# Patient Record
Sex: Male | Born: 1990
Health system: Southern US, Community
[De-identification: ages and names within clinical notes are randomized; demographics above are authoritative.]

## PROBLEM LIST (undated history)

## (undated) DIAGNOSIS — G47419 Narcolepsy without cataplexy: Secondary | ICD-10-CM

## (undated) DIAGNOSIS — F988 Other specified behavioral and emotional disorders with onset usually occurring in childhood and adolescence: Secondary | ICD-10-CM

## (undated) DIAGNOSIS — F419 Anxiety disorder, unspecified: Secondary | ICD-10-CM

## (undated) HISTORY — DX: Other specified behavioral and emotional disorders with onset usually occurring in childhood and adolescence: F98.8

## (undated) HISTORY — DX: Anxiety disorder, unspecified: F41.9

## (undated) HISTORY — DX: Narcolepsy without cataplexy: G47.419

## (undated) HISTORY — PX: KNEE SURGERY: SHX244

---
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
## 2011-02-01 ENCOUNTER — Ambulatory Visit (INDEPENDENT_AMBULATORY_CARE_PROVIDER_SITE_OTHER): Payer: BC Managed Care – PPO | Admitting: Internal Medicine

## 2011-02-01 DIAGNOSIS — Z23 Encounter for immunization: Secondary | ICD-10-CM

## 2011-02-01 DIAGNOSIS — G4700 Insomnia, unspecified: Secondary | ICD-10-CM

## 2011-02-01 DIAGNOSIS — J019 Acute sinusitis, unspecified: Secondary | ICD-10-CM

## 2011-02-01 DIAGNOSIS — G47419 Narcolepsy without cataplexy: Secondary | ICD-10-CM

## 2011-02-14 HISTORY — PX: WISDOM TOOTH EXTRACTION: SHX21

## 2011-06-15 ENCOUNTER — Telehealth: Payer: Self-pay

## 2011-06-15 MED ORDER — ZOLPIDEM TARTRATE 10 MG PO TABS: 10.0000 mg | ORAL_TABLET | Freq: Every evening | ORAL | Status: DC | PRN

## 2011-06-15 MED ORDER — AMPHETAMINE-DEXTROAMPHET ER 30 MG PO CP24: ORAL_CAPSULE | ORAL | Status: DC

## 2011-06-15 MED ORDER — AMPHETAMINE-DEXTROAMPHETAMINE 30 MG PO TABS: ORAL_TABLET | ORAL | Status: DC

## 2011-06-15 NOTE — Telephone Encounter (Signed)
Pt requesting his adderoll refills on 30 mg extended release and 30 mg regular release be sent to [facility] pharmacy  Dr. Merla Riches any questions please call patient

## 2011-06-15 NOTE — Telephone Encounter (Signed)
Advised pt that rx's are ready.  Pt wanted it mailed to him.  He also need his sleeping med

## 2011-06-15 NOTE — Telephone Encounter (Signed)
rxs are ready to be picked up (or we can mail them but we need address)

## 2011-07-26 ENCOUNTER — Telehealth: Payer: Self-pay

## 2011-07-26 ENCOUNTER — Ambulatory Visit: Payer: BC Managed Care – PPO | Admitting: Internal Medicine

## 2011-07-26 DIAGNOSIS — G47419 Narcolepsy without cataplexy: Secondary | ICD-10-CM

## 2011-07-26 DIAGNOSIS — G4700 Insomnia, unspecified: Secondary | ICD-10-CM

## 2011-07-26 MED ORDER — AMPHETAMINE-DEXTROAMPHETAMINE 30 MG PO TABS: ORAL_TABLET | ORAL | Status: DC

## 2011-07-26 MED ORDER — ZOLPIDEM TARTRATE 10 MG PO TABS: 10.0000 mg | ORAL_TABLET | Freq: Every evening | ORAL | Status: DC | PRN

## 2011-07-26 MED ORDER — AMPHETAMINE-DEXTROAMPHET ER 30 MG PO CP24: ORAL_CAPSULE | ORAL | Status: DC

## 2011-07-26 NOTE — Telephone Encounter (Signed)
Okay to refill his meds while waiting for appointment followup in July

## 2011-07-26 NOTE — Telephone Encounter (Signed)
LMOM THAT RX IS READY FOR PICKUP 

## 2011-07-26 NOTE — Telephone Encounter (Signed)
Pt had to cancel his appt with Dr. Merla Riches today as he was called into work. He is rescheduled for 7/10. Can he get 1 months refill on adderall and sleep meds? Please call when/if ready and he will pickup rx's.

## 2011-08-23 ENCOUNTER — Ambulatory Visit (INDEPENDENT_AMBULATORY_CARE_PROVIDER_SITE_OTHER): Payer: BC Managed Care – PPO | Admitting: Internal Medicine

## 2011-08-23 VITALS — BP 128/86 | HR 69 | Temp 97.0°F | Resp 16 | Ht 72.0 in | Wt 281.0 lb

## 2011-08-23 DIAGNOSIS — H9209 Otalgia, unspecified ear: Secondary | ICD-10-CM

## 2011-08-23 DIAGNOSIS — F988 Other specified behavioral and emotional disorders with onset usually occurring in childhood and adolescence: Secondary | ICD-10-CM

## 2011-08-23 DIAGNOSIS — Z6838 Body mass index (BMI) 38.0-38.9, adult: Secondary | ICD-10-CM

## 2011-08-23 DIAGNOSIS — M533 Sacrococcygeal disorders, not elsewhere classified: Secondary | ICD-10-CM

## 2011-08-23 DIAGNOSIS — L709 Acne, unspecified: Secondary | ICD-10-CM

## 2011-08-23 DIAGNOSIS — Z8659 Personal history of other mental and behavioral disorders: Secondary | ICD-10-CM

## 2011-08-23 DIAGNOSIS — G4700 Insomnia, unspecified: Secondary | ICD-10-CM

## 2011-08-23 DIAGNOSIS — G47419 Narcolepsy without cataplexy: Secondary | ICD-10-CM

## 2011-08-23 MED ORDER — AMOXICILLIN 500 MG PO CAPS: 1000.0000 mg | ORAL_CAPSULE | Freq: Two times a day (BID) | ORAL | Status: AC

## 2011-08-23 MED ORDER — AMPHETAMINE-DEXTROAMPHETAMINE 30 MG PO TABS: 45.0000 mg | ORAL_TABLET | Freq: Every day | ORAL | Status: DC

## 2011-08-23 MED ORDER — AMPHETAMINE-DEXTROAMPHET ER 30 MG PO CP24: 60.0000 mg | ORAL_CAPSULE | ORAL | Status: DC

## 2011-08-23 MED ORDER — ZOLPIDEM TARTRATE 10 MG PO TABS: 10.0000 mg | ORAL_TABLET | Freq: Every evening | ORAL | Status: DC | PRN

## 2011-08-23 MED ORDER — AMPHETAMINE-DEXTROAMPHET ER 30 MG PO CP24: ORAL_CAPSULE | ORAL | Status: DC

## 2011-08-23 MED ORDER — MELOXICAM 15 MG PO TABS: 15.0000 mg | ORAL_TABLET | Freq: Every day | ORAL | Status: DC

## 2011-08-23 MED ORDER — FLUTICASONE PROPIONATE 50 MCG/ACT NA SUSP: 2.0000 | Freq: Every day | NASAL | Status: DC

## 2011-08-23 NOTE — Progress Notes (Signed)
Subjective:    Patient ID: Frank [name], male    DOB: 07-17-1990, 21 y.o.   MRN: [id number]  HPI Patient Active Problem List  Diagnosis  . Narcolepsy-His current regimen of Adderall is effective  . 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  . Acne-Doxy  . History of OCD (obsessive compulsive disorder)-No symptoms now  . BMI 38.0-38.9,adult  Also complaining of ear pain x3 weeks with muffled hearing/history of increased Spring allergies and summertime allergies despite use of OTC antihistamines He also has had significant back discomfort over the past 3 or 4 months/at first it was on and off but now is more persistent His pain is in the lumbosacral and sciatic area on one side and seems to occasionally radiate into the leg without weakness or numbness. He gets worse with certain sports like golf or basketball.     Review of Systems No fever chills or night sweats No chest pain or palpitations No headaches No anxiety or sessions/sleep is good As he continues to use Ambien on a when necessary basis    Objective:   Physical Exam Blood pressure 128/86/wt 280 pounds = HEENT-right ear has tympanic membrane with fluid behind it that is the consistency of pus/eardrum injected. Left ear is clear/nares are boggy without purulent discharge Throat clear/no nodes or thyromegaly Heart regular Lungs clear The back is slightly tender over the lumbar area in the midline and particularly tender in the left SI joint Straight leg raise is intact to 90 bilaterally/external hip rotation produces pain in the left sciatic area Deep tendon reflexes are intact Neuropsychiatric stable      Assessment & Plan:  Problem #1 narcolepsy Problem #2 ADD He'll continue to use Adderall for both of these conditions Followup at semester  break  Problem #3 sacroiliitis/lumbar pain He will continue exercises and use MOBIC 15 mg daily for one to 2 months/if his back pain persists he will need radiological evaluation to find a diagnosis/physical therapy is an option while he is at school/We'll recheck this in one to 4 months depending on his symptoms  Problem #4 otitis media on the right secondary to allergic rhinitis Add Flonase to treat with Amoxil   Problem #5 insomnia-continue when necessary Ambien Meds ordered this encounter  Medications                . amoxicillin (AMOXIL) 500 MG capsule    Sig: Take 2 capsules (1,000 mg total) by mouth 2 (two) times daily.    Dispense:  40 capsule    Refill:  0  . fluticasone (FLONASE) 50 MCG/ACT nasal spray    Sig: Place 2 sprays into the nose daily. At hs for next 2-3 months    Dispense:  16 g    Refill:  6  . meloxicam (MOBIC) 15 MG tablet    Sig: Take 1 tablet (15 mg total) by mouth daily.    Dispense:  30 tablet    Refill:  1  . zolpidem (AMBIEN) 10 MG tablet    Sig: Take 1 tablet (10 mg total) by mouth at bedtime as needed for sleep.    Dispense:  30 tablet    Refill:  0  . amphetamine-dextroamphetamine (ADDERALL XR) 30 MG 24 hr capsule    Sig: Take 2 capsules (60 mg total) by mouth  every morning.    Dispense:  60 capsule    Refill:  0  . amphetamine-dextroamphetamine (ADDERALL XR) 30 MG 24 hr capsule    Sig: Take 2 capsules (60 mg total) by mouth every morning.    Dispense:  60 capsule    Refill:  0  . amphetamine-dextroamphetamine (ADDERALL) 30 MG tablet    Sig: Take 1.5 tablets (45 mg total) by mouth daily.    Dispense:  45 tablet    Refill:  0  . amphetamine-dextroamphetamine (ADDERALL) 30 MG tablet    Sig: Take 1.5 tablets (45 mg total) by mouth daily.    Dispense:  45 tablet    Refill:  0  . amphetamine-dextroamphetamine (ADDERALL) 30 MG tablet    Sig: Take 1.5 tablets (45 mg total) by mouth daily.    Dispense:  45 tablet    Refill:  0  .  amphetamine-dextroamphetamine (ADDERALL XR) 30 MG 24 hr capsule    Sig: 2 in the morning    Dispense:  60 capsule    Refill:  0   They called for another 3 months supply adderall He takes 60 mg of XR in the morning and 30-45 mg of regular release at 4 PM

## 2011-08-25 ENCOUNTER — Encounter: Payer: Self-pay | Admitting: Internal Medicine

## 2011-09-27 ENCOUNTER — Other Ambulatory Visit: Payer: Self-pay | Admitting: Radiology

## 2011-09-27 DIAGNOSIS — G4700 Insomnia, unspecified: Secondary | ICD-10-CM

## 2011-09-27 MED ORDER — ZOLPIDEM TARTRATE 10 MG PO TABS: 10.0000 mg | ORAL_TABLET | Freq: Every evening | ORAL | Status: DC | PRN

## 2011-11-27 ENCOUNTER — Telehealth: Payer: Self-pay

## 2011-11-27 NOTE — Telephone Encounter (Signed)
His Adderall was filled for 6 mo supply in July, he should still have this. Unable to leave message, his voice mail is full. I need to know what meds he needs.

## 2011-11-27 NOTE — Telephone Encounter (Signed)
PATIENT HAS CALLED IN REGARDS TO GETTING A REFILL ON ALL HIS MEDICATION. HIS PCP IS DR. Merla Riches, PATIENT ASK IF ITS POSSIBLE TO PICK UP ON Thursday. PLEASE CALL BACK TO SEE IF THIS IS POSSIBLE. THANK YOU!!

## 2011-11-28 NOTE — Telephone Encounter (Signed)
Unable to leave message

## 2011-11-29 NOTE — Telephone Encounter (Signed)
Pt states he was given two months only. He states 2 XR in the am and 2 regular tabs in the evening. Please advise

## 2011-11-30 NOTE — Telephone Encounter (Signed)
[place] database indicates that he just filled his Adderall XR 30 mg on 11/15/11. Our records also indicate that he did receive 3 month supply of each his XR and the regular release. However the [facility] database does not yet show that he has filled the third prescription of the regular release Adderall. It indicates this was last filled on 10/23/11.

## 2011-12-01 ENCOUNTER — Telehealth: Payer: Self-pay

## 2011-12-01 ENCOUNTER — Other Ambulatory Visit: Payer: Self-pay | Admitting: Internal Medicine

## 2011-12-01 DIAGNOSIS — F988 Other specified behavioral and emotional disorders with onset usually occurring in childhood and adolescence: Secondary | ICD-10-CM

## 2011-12-01 DIAGNOSIS — M533 Sacrococcygeal disorders, not elsewhere classified: Secondary | ICD-10-CM

## 2011-12-01 DIAGNOSIS — G47419 Narcolepsy without cataplexy: Secondary | ICD-10-CM

## 2011-12-01 DIAGNOSIS — Z8659 Personal history of other mental and behavioral disorders: Secondary | ICD-10-CM

## 2011-12-01 DIAGNOSIS — G4700 Insomnia, unspecified: Secondary | ICD-10-CM

## 2011-12-01 MED ORDER — AMPHETAMINE-DEXTROAMPHET ER 30 MG PO CP24: 60.0000 mg | ORAL_CAPSULE | ORAL | Status: DC

## 2011-12-01 MED ORDER — AMPHETAMINE-DEXTROAMPHETAMINE 30 MG PO TABS: 45.0000 mg | ORAL_TABLET | Freq: Every day | ORAL | Status: DC

## 2011-12-01 MED ORDER — ZOLPIDEM TARTRATE 10 MG PO TABS: 10.0000 mg | ORAL_TABLET | Freq: Every evening | ORAL | Status: DC | PRN

## 2011-12-01 MED ORDER — AMPHETAMINE-DEXTROAMPHET ER 30 MG PO CP24: ORAL_CAPSULE | ORAL | Status: DC

## 2011-12-01 MED ORDER — MELOXICAM 15 MG PO TABS: 15.0000 mg | ORAL_TABLET | Freq: Every day | ORAL | Status: DC

## 2011-12-01 NOTE — Progress Notes (Signed)
Rxs printed and signed. Dr Merla Riches has notified mother that they are ready for p/up.

## 2011-12-01 NOTE — Telephone Encounter (Signed)
Rx are printed and signed. Dr Merla Riches has notified mother they are ready for p/up

## 2011-12-01 NOTE — Telephone Encounter (Signed)
Mom is calling and would like to discuss why her sons request for refills has not been acknowledged.   Please call BECKY at [phone]

## 2011-12-01 NOTE — Progress Notes (Signed)
Doing well with current regimen and just needs refills for another 3 months Meds ordered this encounter  Medications  . meloxicam (MOBIC) 15 MG tablet    Sig: Take 1 tablet (15 mg total) by mouth daily.    Dispense:  90 tablet    Refill:  2  . zolpidem (AMBIEN) 10 MG tablet    Sig: Take 1 tablet (10 mg total) by mouth at bedtime as needed for sleep.    Dispense:  30 tablet    Refill:  5  . amphetamine-dextroamphetamine (ADDERALL) 30 MG tablet    Sig: Take 1.5 tablets (45 mg total) by mouth daily.    Dispense:  45 tablet    Refill:  0  . amphetamine-dextroamphetamine (ADDERALL XR) 30 MG 24 hr capsule    Sig: Take 2 capsules (60 mg total) by mouth every morning.    Dispense:  60 capsule    Refill:  0  . amphetamine-dextroamphetamine (ADDERALL XR) 30 MG 24 hr capsule    Sig: Take 2 capsules (60 mg total) by mouth every morning.    Dispense:  60 capsule    Refill:  0  . amphetamine-dextroamphetamine (ADDERALL XR) 30 MG 24 hr capsule    Sig: 2 in the morning    Dispense:  60 capsule    Refill:  0  . amphetamine-dextroamphetamine (ADDERALL) 30 MG tablet    Sig: Take 1.5 tablets (45 mg total) by mouth daily.    Dispense:  45 tablet    Refill:  0  . amphetamine-dextroamphetamine (ADDERALL) 30 MG tablet    Sig: Take 1.5 tablets (45 mg total) by mouth daily.    Dispense:  45 tablet    Refill:  0

## 2011-12-01 NOTE — Telephone Encounter (Signed)
Dr Merla Riches has spoken w/mother, clarified, and written Rxs. He notified mother they are ready for p/up.

## 2012-01-30 ENCOUNTER — Ambulatory Visit (INDEPENDENT_AMBULATORY_CARE_PROVIDER_SITE_OTHER): Payer: BC Managed Care – PPO | Admitting: Physician Assistant

## 2012-01-30 VITALS — BP 131/92 | HR 72 | Temp 97.7°F | Resp 16 | Ht 72.0 in | Wt 283.0 lb

## 2012-01-30 DIAGNOSIS — H6980 Other specified disorders of Eustachian tube, unspecified ear: Secondary | ICD-10-CM

## 2012-01-30 DIAGNOSIS — G4700 Insomnia, unspecified: Secondary | ICD-10-CM

## 2012-01-30 DIAGNOSIS — R059 Cough, unspecified: Secondary | ICD-10-CM

## 2012-01-30 DIAGNOSIS — R05 Cough: Secondary | ICD-10-CM

## 2012-01-30 DIAGNOSIS — M533 Sacrococcygeal disorders, not elsewhere classified: Secondary | ICD-10-CM

## 2012-01-30 DIAGNOSIS — J069 Acute upper respiratory infection, unspecified: Secondary | ICD-10-CM

## 2012-01-30 MED ORDER — TRIAMCINOLONE ACETONIDE(NASAL) 55 MCG/ACT NA INHA: 2.0000 | Freq: Every day | NASAL | Status: DC

## 2012-01-30 MED ORDER — GUAIFENESIN ER 1200 MG PO TB12: 1.0000 | ORAL_TABLET | Freq: Two times a day (BID) | ORAL | Status: DC

## 2012-01-30 MED ORDER — ZOLPIDEM TARTRATE 10 MG PO TABS: 10.0000 mg | ORAL_TABLET | Freq: Every evening | ORAL | Status: DC | PRN

## 2012-01-30 MED ORDER — MELOXICAM 15 MG PO TABS: 15.0000 mg | ORAL_TABLET | Freq: Every day | ORAL | Status: DC

## 2012-01-30 MED ORDER — HYDROCOD POLST-CHLORPHEN POLST 10-8 MG/5ML PO LQCR: 5.0000 mL | Freq: Two times a day (BID) | ORAL | Status: DC

## 2012-01-30 NOTE — Patient Instructions (Addendum)
Sudafed for congestion if still congested prior to flight - use about 30-1h before take off -Afrin - use 30 min prior to departure and then again before decent into [place].  If ears having pain/popping getting to [place] use again before take off in [place].

## 2012-01-30 NOTE — Progress Notes (Signed)
   [address], [place] [address]   Phone [phone]  Subjective:    Patient ID: Frank [name], male    DOB: 1990/12/07, 21 y.o.   MRN: [id number]  HPI  Pt presents to clinic with cold symptoms for about 6 days with ear clogged, sinus congestion and cough.  He goes to school at [employer] and was having trouble with his ears before he left for school.  His R was worse and now that one has improved and now he is having trouble with his L ear.  No hearing changed.  Popping sensation. Also sinus pressure with some mild rhinorrhea, cough that is keeping him up at night.  He is also needing a refill on his Ambien and his Mobic.  He is going skiing this week at [facility].  He uses his Ambien 2-3 nights per week.    Review of Systems  Constitutional: Negative for fever and chills.  HENT: Positive for ear pain (currently L>R), congestion, rhinorrhea and postnasal drip. Negative for sore throat.   Respiratory: Positive for cough (increased at night).   Psychiatric/Behavioral: Positive for sleep disturbance.       Objective:   Physical Exam  Vitals reviewed. Constitutional: He is oriented to person, place, and time. He appears well-developed and well-nourished.  HENT:  Head: Normocephalic and atraumatic.  Right Ear: Hearing, external ear and ear canal normal. Tympanic membrane is erythematous and bulging (clear fluid with air fluid level).  Left Ear: Hearing, external ear and ear canal normal. Tympanic membrane is erythematous and bulging (clear fluid behind TM).  Nose: Mucosal edema (erythema) present.  Mouth/Throat: Uvula is midline, oropharynx is clear and moist and mucous membranes are normal.  Eyes: Conjunctivae normal are normal.  Neck: Normal range of motion. Neck supple.  Cardiovascular: Normal rate, regular rhythm and normal heart sounds.   Pulmonary/Chest: Effort normal and breath sounds normal.  Lymphadenopathy:    He has no cervical adenopathy.  Neurological: He is alert  and oriented to person, place, and time.  Skin: Skin is warm and dry.  Psychiatric: He has a normal mood and affect. His behavior is normal. Judgment and thought content normal.          Assessment & Plan:   1. URI (upper respiratory infection)  triamcinolone (NASACORT AQ) 55 MCG/ACT nasal inhaler, Guaifenesin (MUCINEX MAXIMUM STRENGTH) 1200 MG TB12  2. Insomnia  zolpidem (AMBIEN) 10 MG tablet  3. ETD (eustachian tube dysfunction)  triamcinolone (NASACORT AQ) 55 MCG/ACT nasal inhaler  4. Cough  chlorpheniramine-HYDROcodone (TUSSIONEX PENNKINETIC ER) 10-8 MG/5ML LQCR  5. Sacroiliac joint dysfunction of left side  meloxicam (MOBIC) 15 MG tablet

## 2012-02-01 ENCOUNTER — Telehealth: Payer: Self-pay

## 2012-02-01 MED ORDER — AMOXICILLIN 875 MG PO TABS: 875.0000 mg | ORAL_TABLET | Freq: Two times a day (BID) | ORAL | Status: DC

## 2012-02-01 NOTE — Telephone Encounter (Signed)
PT IS CALLING TO LET SARAH WEBER KNOW THAT HE IS STILL NOT FEELING BETTER AND HE STILL FLYING OUT TONIGHT, HIS EARS STILL HAVE NOT RELEASED PT STATED THAT SARAH TOLD HIM TO CALL BACK AND SPEAK DIRECTLY TO HER. I CALLED [facility] NO ANSWER PLEASE CONTACT PT TO ADVISE [phone]

## 2012-02-01 NOTE — Telephone Encounter (Signed)
Sent in antibiotic to pharmacy. Spoke with patient.

## 2012-04-12 ENCOUNTER — Other Ambulatory Visit: Payer: Self-pay | Admitting: Physician Assistant

## 2012-04-12 NOTE — Telephone Encounter (Signed)
I filled this for him once while he was here for a sick visit but Dr Merla Riches is regular provider.

## 2012-04-15 ENCOUNTER — Other Ambulatory Visit: Payer: Self-pay | Admitting: Radiology

## 2012-04-23 ENCOUNTER — Ambulatory Visit: Payer: BC Managed Care – PPO

## 2012-04-23 ENCOUNTER — Ambulatory Visit (INDEPENDENT_AMBULATORY_CARE_PROVIDER_SITE_OTHER): Payer: BC Managed Care – PPO | Admitting: Internal Medicine

## 2012-04-23 VITALS — BP 132/78 | HR 92 | Temp 98.0°F | Resp 18 | Ht 71.75 in | Wt 282.0 lb

## 2012-04-23 DIAGNOSIS — M533 Sacrococcygeal disorders, not elsewhere classified: Secondary | ICD-10-CM

## 2012-04-23 DIAGNOSIS — J309 Allergic rhinitis, unspecified: Secondary | ICD-10-CM

## 2012-04-23 DIAGNOSIS — G47419 Narcolepsy without cataplexy: Secondary | ICD-10-CM

## 2012-04-23 DIAGNOSIS — M545 Low back pain, unspecified: Secondary | ICD-10-CM

## 2012-04-23 DIAGNOSIS — J019 Acute sinusitis, unspecified: Secondary | ICD-10-CM

## 2012-04-23 DIAGNOSIS — F909 Attention-deficit hyperactivity disorder, unspecified type: Secondary | ICD-10-CM

## 2012-04-23 DIAGNOSIS — G4700 Insomnia, unspecified: Secondary | ICD-10-CM

## 2012-04-23 MED ORDER — ZOLPIDEM TARTRATE 10 MG PO TABS: ORAL_TABLET | ORAL | Status: DC

## 2012-04-23 MED ORDER — FLUTICASONE PROPIONATE 50 MCG/ACT NA SUSP: NASAL | Status: DC

## 2012-04-23 MED ORDER — PREDNISONE 20 MG PO TABS: ORAL_TABLET | ORAL | Status: DC

## 2012-04-23 MED ORDER — AMOXICILLIN 500 MG PO CAPS: 1000.0000 mg | ORAL_CAPSULE | Freq: Two times a day (BID) | ORAL | Status: AC

## 2012-04-23 MED ORDER — AMPHETAMINE-DEXTROAMPHET ER 30 MG PO CP24: 60.0000 mg | ORAL_CAPSULE | ORAL | Status: DC

## 2012-04-23 MED ORDER — AMPHETAMINE-DEXTROAMPHETAMINE 30 MG PO TABS: 45.0000 mg | ORAL_TABLET | Freq: Every day | ORAL | Status: DC

## 2012-04-23 NOTE — Progress Notes (Signed)
Subjective:    Patient ID: Frank [name], male    DOB: 07-24-1990, 22 y.o.   MRN: [id number]  HPI here for followup Patient Active Problem List  Diagnosis  . Narcolepsy  . ADD (attention deficit disorder)  . Acne  . History of OCD (obsessive compulsive disorder)  . BMI 38.0-38.9,adult  . Insomnia   Continues to have a few medical issues: ears popping frequently/treated for sinus infection here in December and partially improved but has relapsed with sinus congestion/early morning cough/purulent nasal discharge/popping of the ears with swallowing or coughing/no fever/sneezing often  Also continues to have Back/hip  Pain on the left as evaluated at last visit He has not responded to meloxicam and exercises Pain is now mainly left lumbosacral and worse with sitting, driving//occasionally worse with exercise but usually improves slightly and then returns later in the evening No weakness/no paresthesias/no known lumbosacral injury  Narcolepsy and attention deficit disorder continue to respond to Adderall without side effects Good academics this year/18 months ago at the completion of the semester 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  Currently working with [employer]  Ambien continues to be needed for sleep/no depression/no anxiety/no sessions at this point   Review of Systems No weight loss or weight gain No fatigue No night sweats No shortness of breath or cough Chest pain or palpitations No headache or visual changes No GI or GU symptoms    Objective:   Physical Exam BP 132/78  Pulse 92  Temp(Src) 98 F (36.7 C) (Oral)  Resp 18  Ht 5' 11.75" (1.822 m)  Wt 282 lb (127.914 kg)  BMI 38.53 kg/m2  SpO2 97% HEENT clear No thyromegaly or lymphadenopathy Lungs clear Heart regular without murmur Tender over the left lumbosacral area and left SI joint Pain in the left SI joint with straight leg raise past 75 Twisting flexion uncomfortable Deep tendon reflexes  intact/no motor or sensory losses      [facility] reading (PRIMARY) by  Dr. Merla Riches lumbar spine normal   Assessment & Plan:  Problem #1 narcolepsy-no change Problem #2 ADD-no change Problem #3 low back pain-muscular likely/with are without sacroiliitis-prednisone taper/referred for  physical therapy in [place]/call if needs  meloxicam Problem #4 recurrent sinusitis/? Allergic rhinitis from living conditions-Flonase twice a day/as antihistamines if not enough/treated with amoxicillin for 10 days/prednisone taper Problem #5 insomnia-continue Ambien  Meds ordered this encounter  Medications  . amoxicillin (AMOXIL) 500 MG capsule    Sig: Take 2 capsules (1,000 mg total) by mouth 2 (two) times daily.    Dispense:  40 capsule    Refill:  0  . fluticasone (FLONASE) 50 MCG/ACT nasal spray    Sig: 1 spray each nostril twice a day    Dispense:  16 g    Refill:  11  . : amphetamine-dextroamphetamine (ADDERALL) 30 MG tablet    Sig: Take 1.5 tablets (45 mg total) by mouth daily.    Dispense:  45 tablet    Refill:  0  .  amphetamine-dextroamphetamine (ADDERALL XR) 30 MG 24 hr capsule    Sig: Take 2 capsules (60 mg total) by mouth every morning.    Dispense:  60 capsule    Refill:  0  .  amphetamine-dextroamphetamine (ADDERALL XR) 30 MG 24 hr capsule    Sig: Take 2 capsules (60 mg total) by mouth every morning. 05/24/12    Dispense:  60 capsule    Refill:  0  .  amphetamine-dextroamphetamine (ADDERALL) 30 MG tablet  Sig: Take 1.5 tablets (45 mg total) by mouth daily. 05/24/12    Dispense:  45 tablet    Refill:  0  . amphetamine-dextroamphetamine (ADDERALL XR) 30 MG 24 hr capsule    Sig: Take 2 capsules (60 mg total) by mouth every morning. 06/23/12    Dispense:  60 capsule    Refill:  0  . amphetamine-dextroamphetamine (ADDERALL) 30 MG tablet    Sig: Take 1.5 tablets (45 mg total) by mouth daily. 06/23/12    Dispense:  45 tablet    Refill:  0  . zolpidem (AMBIEN) 10 MG tablet    Sig: TAKE  1 TABLET BY MOUTH AT BEDTIME AS NEEDED FOR SLEEP    Dispense:  30 tablet    Refill:  5  . predniSONE (DELTASONE) 20 MG tablet    Sig: 4/3/3/2/2/1/1 single daily dose 7 days////this was written to replace the prescription from earlier this afternoon for a three-day therapy    Dispense:  16 tablet    Refill:  0  Recheck 6 months

## 2012-04-25 ENCOUNTER — Telehealth: Payer: Self-pay

## 2012-04-25 NOTE — Telephone Encounter (Signed)
Pt went to pharmacy to pick up his amphetamine-dextroamphetamine (ADDERALL XR) 30 MG 24 hr capsule And was told his insurance company will no longer cover the medication because he is over 20 years of age.  [phone]

## 2012-04-26 NOTE — Telephone Encounter (Signed)
Do you have prior  auth for this?

## 2012-04-29 NOTE — Telephone Encounter (Signed)
I have not received a request from pharmacy. Called pharmacy and requested that they send PA request w/ID and phone info needed to complete PA.

## 2012-05-08 NOTE — Telephone Encounter (Signed)
Called pharm back to f/up. Pharm reported that pt p/up both Rxs and paid OOP for them ($150 for the XR) and that is why they never sent the PA request. Got them to fax PA req and sent completed form to [facility] for review.

## 2012-05-10 NOTE — Telephone Encounter (Signed)
Called to check status of PA for both Adderall Rxs. Was advised that neither med is covered at all for pt's over 21, even w/prior [place]. Pt will need to pay OOP.  LMOM for pt to CB to inform. Does he want to try more freq dosing of immediate release to keep cost down?

## 2012-05-13 ENCOUNTER — Encounter: Payer: Self-pay | Admitting: Internal Medicine

## 2012-05-21 ENCOUNTER — Telehealth: Payer: Self-pay

## 2012-05-21 NOTE — Telephone Encounter (Signed)
Mailed letter to him, since he has not called

## 2012-05-21 NOTE — Telephone Encounter (Signed)
Called pharm and then pt's mother to get the correct phone number for ins. Called [employer] to find out how to do an appeal for coverage of pt's Adderall Rxs for narcolepsy. Faxed in appeal letter written by Dr Merla Riches to # [phone] w/confirmation.

## 2012-06-25 NOTE — Telephone Encounter (Signed)
LMOM for pt that Adderall XR has been approved through the appeal from 06/19/12 - 06/19/13. The letter did not specify that regular version is also approved. Asked pt to CB if the regular version does not go through when filled.

## 2012-08-02 ENCOUNTER — Telehealth: Payer: Self-pay | Admitting: Radiology

## 2012-08-02 NOTE — Telephone Encounter (Signed)
Prior auth form rc'd for the Adderall Frank [name] is already working on this form in her box.

## 2012-08-05 NOTE — Telephone Encounter (Signed)
Called pharmacy to make sure this Rx went through and it did. It was approved through appeal, see prev phone mes.

## 2012-08-28 ENCOUNTER — Telehealth: Payer: Self-pay

## 2012-08-28 DIAGNOSIS — G47419 Narcolepsy without cataplexy: Secondary | ICD-10-CM

## 2012-08-28 MED ORDER — AMPHETAMINE-DEXTROAMPHETAMINE 30 MG PO TABS: 45.0000 mg | ORAL_TABLET | Freq: Every day | ORAL | Status: DC

## 2012-08-28 MED ORDER — AMPHETAMINE-DEXTROAMPHET ER 30 MG PO CP24: 60.0000 mg | ORAL_CAPSULE | ORAL | Status: DC

## 2012-08-28 NOTE — Telephone Encounter (Signed)
Called patient to let him know his Adderall rx's were at 102 check-in ready for pick up.

## 2012-08-28 NOTE — Telephone Encounter (Signed)
Meds ordered this encounter  Medications  . amphetamine-dextroamphetamine (ADDERALL) 30 MG tablet    Sig: Take 1.5 tablets (45 mg total) by mouth daily.    Dispense:  45 tablet    Refill:  0  . amphetamine-dextroamphetamine (ADDERALL XR) 30 MG 24 hr capsule    Sig: Take 2 capsules (60 mg total) by mouth every morning.    Dispense:  60 capsule    Refill:  0  . amphetamine-dextroamphetamine (ADDERALL) 30 MG tablet    Sig: Take 1.5 tablets (45 mg total) by mouth daily.    Dispense:  45 tablet    Refill:  0    Fill on or after 09/28/12  . amphetamine-dextroamphetamine (ADDERALL XR) 30 MG 24 hr capsule    Sig: Take 2 capsules (60 mg total) by mouth every morning.    Dispense:  60 capsule    Refill:  0    Fill on or after 09/28/12  . amphetamine-dextroamphetamine (ADDERALL XR) 30 MG 24 hr capsule    Sig: Take 2 capsules (60 mg total) by mouth every morning.    Dispense:  60 capsule    Refill:  0    For 10/27/12 needs follow up before this runs out.  Marland Kitchen amphetamine-dextroamphetamine (ADDERALL) 30 MG tablet    Sig: Take 1.5 tablets (45 mg total) by mouth daily.    Dispense:  45 tablet    Refill:  0    For 10/28/12 needs follow up before this runs out

## 2012-08-28 NOTE — Telephone Encounter (Signed)
Pt is in town for the day and pt's mother is calling to check and see if rx for adderall can be picked up today. Best#  [phone] or Ramin Zoll (mother) at [phone]

## 2012-09-11 ENCOUNTER — Ambulatory Visit: Payer: Self-pay | Admitting: Internal Medicine

## 2012-11-28 ENCOUNTER — Telehealth: Payer: Self-pay

## 2012-11-28 ENCOUNTER — Other Ambulatory Visit: Payer: Self-pay | Admitting: Sports Medicine

## 2012-11-28 ENCOUNTER — Other Ambulatory Visit: Payer: Self-pay | Admitting: Internal Medicine

## 2012-11-28 DIAGNOSIS — M25562 Pain in left knee: Secondary | ICD-10-CM

## 2012-11-28 DIAGNOSIS — G47419 Narcolepsy without cataplexy: Secondary | ICD-10-CM

## 2012-11-28 NOTE — Telephone Encounter (Signed)
Refill request amphetamine-dextroamphetamine (ADDERALL XR) 30 MG 24 hr capsule amphetamine-dextroamphetamine (ADDERALL) 30 MG tablet   [phone]

## 2012-11-28 NOTE — Telephone Encounter (Signed)
amphetamine-dextroamphetamine (ADDERALL XR) 30 MG 24 hr capsule refill   [phone]

## 2012-11-28 NOTE — Telephone Encounter (Signed)
He is due for follow up, what is the plan? He states he can come back in the next 2-3 weeks, he indicates he is having knee trouble with his knee, and has MRI scheduled for this, and is needing treatment related to his knee. Please advise. Pended.

## 2012-11-29 ENCOUNTER — Ambulatory Visit
Admission: RE | Admit: 2012-11-29 | Discharge: 2012-11-29 | Disposition: A | Discharge status: Home / Self Care | Payer: BC Managed Care – PPO | Source: Ambulatory Visit | Attending: Sports Medicine | Admitting: Sports Medicine

## 2012-11-29 DIAGNOSIS — M25562 Pain in left knee: Secondary | ICD-10-CM

## 2012-11-29 MED ORDER — AMPHETAMINE-DEXTROAMPHETAMINE 30 MG PO TABS: 45.0000 mg | ORAL_TABLET | Freq: Every day | ORAL | Status: DC

## 2012-11-29 MED ORDER — AMPHETAMINE-DEXTROAMPHET ER 30 MG PO CP24: 60.0000 mg | ORAL_CAPSULE | ORAL | Status: DC

## 2012-11-29 NOTE — Telephone Encounter (Signed)
Rx printed

## 2012-11-29 NOTE — Telephone Encounter (Signed)
Patient advised.Rx ready, he is due for follow up.

## 2012-11-29 NOTE — Telephone Encounter (Signed)
[name] is on meds for narcolepsy and refills are certainly ok i cant sign til mon pm so have heather if he needs it before then

## 2012-12-03 ENCOUNTER — Telehealth: Payer: Self-pay | Admitting: Radiology

## 2012-12-03 ENCOUNTER — Telehealth: Payer: Self-pay

## 2012-12-03 NOTE — Telephone Encounter (Signed)
[facility] not get the [facility] for Ambien, have called in to [facility].

## 2012-12-03 NOTE — Telephone Encounter (Signed)
Patient should have this transferred, [facility] called pharmacy to advise

## 2012-12-03 NOTE — Telephone Encounter (Signed)
[facility] in [place] is calling for a refill request on Ambien.   [phone]

## 2012-12-17 ENCOUNTER — Other Ambulatory Visit: Payer: Self-pay | Admitting: Internal Medicine

## 2012-12-17 DIAGNOSIS — G47419 Narcolepsy without cataplexy: Secondary | ICD-10-CM

## 2012-12-17 DIAGNOSIS — M533 Sacrococcygeal disorders, not elsewhere classified: Secondary | ICD-10-CM

## 2012-12-17 MED ORDER — AMPHETAMINE-DEXTROAMPHET ER 30 MG PO CP24: 60.0000 mg | ORAL_CAPSULE | ORAL | Status: DC

## 2012-12-17 MED ORDER — ZOLPIDEM TARTRATE 10 MG PO TABS: ORAL_TABLET | ORAL | Status: DC

## 2012-12-17 MED ORDER — AMPHETAMINE-DEXTROAMPHETAMINE 30 MG PO TABS: 45.0000 mg | ORAL_TABLET | Freq: Every day | ORAL | Status: DC

## 2012-12-17 MED ORDER — MELOXICAM 15 MG PO TABS: 15.0000 mg | ORAL_TABLET | Freq: Every day | ORAL | Status: DC

## 2012-12-17 MED ORDER — ALPRAZOLAM 0.5 MG PO TABS: 0.5000 mg | ORAL_TABLET | Freq: Two times a day (BID) | ORAL | Status: DC | PRN

## 2012-12-17 MED ORDER — FLUOXETINE HCL 20 MG PO TABS: 20.0000 mg | ORAL_TABLET | Freq: Every day | ORAL | Status: DC

## 2012-12-17 NOTE — Progress Notes (Signed)
Patient Active Problem List   Diagnosis Date Noted  . Narcolepsy 07/26/2011    Priority: High  . Insomnia 01/30/2012  . ADD (attention deficit disorder) 08/23/2011  . Acne 08/23/2011  . History of OCD (obsessive compulsive disorder) 08/23/2011  . 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

## 2013-01-29 ENCOUNTER — Ambulatory Visit: Payer: Self-pay | Admitting: Internal Medicine

## 2013-04-21 ENCOUNTER — Telehealth: Payer: Self-pay

## 2013-04-21 NOTE — Telephone Encounter (Signed)
Pt would like a refill on the following: Adderall xr Adderall  Zolpidem fluticasone  Pt states that he has exams coming up and will be leaving town soon. Best#  [phone]

## 2013-04-22 MED ORDER — AMPHETAMINE-DEXTROAMPHET ER 30 MG PO CP24: 60.0000 mg | ORAL_CAPSULE | ORAL | Status: DC

## 2013-04-22 MED ORDER — ZOLPIDEM TARTRATE 10 MG PO TABS: ORAL_TABLET | ORAL | Status: DC

## 2013-04-22 MED ORDER — AMPHETAMINE-DEXTROAMPHETAMINE 30 MG PO TABS: 45.0000 mg | ORAL_TABLET | Freq: Every day | ORAL | Status: DC

## 2013-04-22 MED ORDER — FLUTICASONE PROPIONATE 50 MCG/ACT NA SUSP: NASAL | Status: DC

## 2013-04-22 NOTE — Telephone Encounter (Signed)
Pt states he was here in the office and had an appt with you "about 3-4 months ago" I do not see any visit in Epic. Advised pt that we needed him to RTC for these refills. Last OV is 04/2012.  Pt asked to have me verify with Dr. Merla Richesoolittle- adamant he was here. Please advise refill request.

## 2013-04-22 NOTE — Progress Notes (Signed)
   Subjective:    Patient ID: Frank [name], male    DOB: 10/19/1990, 23 y.o.   MRN: [id number]  HPI    Review of Systems     Objective:   Physical Exam        Assessment & Plan:   Meds ordered this encounter  Medications  . amphetamine-dextroamphetamine (ADDERALL XR) 30 MG 24 hr capsule    Sig: Take 2 capsules (60 mg total) by mouth every morning.    Dispense:  60 capsule    Refill:  0  . amphetamine-dextroamphetamine (ADDERALL XR) 30 MG 24 hr capsule    Sig: Take 2 capsules (60 mg total) by mouth every morning. For 30 days after dates signed    Dispense:  60 capsule    Refill:  0  . amphetamine-dextroamphetamine (ADDERALL XR) 30 MG 24 hr capsule    Sig: Take 2 capsules (60 mg total) by mouth every morning. For 60 days after dates signed    Dispense:  60 capsule    Refill:  0  . amphetamine-dextroamphetamine (ADDERALL) 30 MG tablet    Sig: Take 1.5 tablets (45 mg total) by mouth daily.    Dispense:  45 tablet    Refill:  0  . amphetamine-dextroamphetamine (ADDERALL) 30 MG tablet    Sig: Take 1.5 tablets (45 mg total) by mouth daily. For 30 days after dates signed    Dispense:  45 tablet    Refill:  0  . amphetamine-dextroamphetamine (ADDERALL) 30 MG tablet    Sig: Take 1.5 tablets (45 mg total) by mouth daily. For 60 days after day signed    Dispense:  45 tablet    Refill:  0  . zolpidem (AMBIEN) 10 MG tablet    Sig: TAKE 1 TABLET BY MOUTH AT BEDTIME AS NEEDED    Dispense:  30 tablet    Refill:  5  . fluticasone (FLONASE) 50 MCG/ACT nasal spray    Sig: 1 spray each nostril twice a day    Dispense:  16 g    Refill:  11   Narcolepsy

## 2013-04-25 ENCOUNTER — Other Ambulatory Visit: Payer: Self-pay | Admitting: Internal Medicine

## 2013-04-28 NOTE — Telephone Encounter (Signed)
See also RF req in phone message. Pt WAS seen by you in 01/2013 for narcolepsy.

## 2013-04-30 NOTE — Telephone Encounter (Signed)
Called in.

## 2013-04-30 NOTE — Telephone Encounter (Signed)
This was done and pt has p/up.

## 2013-07-31 ENCOUNTER — Telehealth: Payer: Self-pay

## 2013-07-31 DIAGNOSIS — G47419 Narcolepsy without cataplexy: Secondary | ICD-10-CM

## 2013-07-31 NOTE — Telephone Encounter (Signed)
Refill on amphetamine-dextroamphetamine (ADDERALL XR) 30 MG 24 hr capsule; says he has an appt. On July 1st, but he is already out of his prescription

## 2013-08-01 MED ORDER — AMPHETAMINE-DEXTROAMPHET ER 30 MG PO CP24: 60.0000 mg | ORAL_CAPSULE | ORAL | Status: DC

## 2013-08-01 MED ORDER — AMPHETAMINE-DEXTROAMPHETAMINE 30 MG PO TABS: 45.0000 mg | ORAL_TABLET | Freq: Every day | ORAL | Status: DC

## 2013-08-01 NOTE — Telephone Encounter (Signed)
Pt notified that it is up front for p/u 

## 2013-08-01 NOTE — Telephone Encounter (Signed)
No orders of the defined types were placed in this encounter.    Meds ordered this encounter  Medications  . amphetamine-dextroamphetamine (ADDERALL XR) 30 MG 24 hr capsule    Sig: Take 2 capsules (60 mg total) by mouth every morning.    Dispense:  60 capsule    Refill:  0  . amphetamine-dextroamphetamine (ADDERALL) 30 MG tablet    Sig: Take 1.5 tablets (45 mg total) by mouth daily.    Dispense:  45 tablet    Refill:  0

## 2013-08-13 ENCOUNTER — Encounter: Payer: Self-pay | Admitting: Internal Medicine

## 2013-08-13 ENCOUNTER — Ambulatory Visit (INDEPENDENT_AMBULATORY_CARE_PROVIDER_SITE_OTHER): Payer: BC Managed Care – PPO | Admitting: Internal Medicine

## 2013-08-13 VITALS — BP 138/82 | HR 92 | Temp 97.7°F | Resp 18 | Ht 73.5 in | Wt 285.0 lb

## 2013-08-13 DIAGNOSIS — Z8659 Personal history of other mental and behavioral disorders: Secondary | ICD-10-CM

## 2013-08-13 DIAGNOSIS — I781 Nevus, non-neoplastic: Secondary | ICD-10-CM

## 2013-08-13 DIAGNOSIS — M533 Sacrococcygeal disorders, not elsewhere classified: Secondary | ICD-10-CM

## 2013-08-13 DIAGNOSIS — S63659A Sprain of metacarpophalangeal joint of unspecified finger, initial encounter: Secondary | ICD-10-CM

## 2013-08-13 DIAGNOSIS — G47419 Narcolepsy without cataplexy: Secondary | ICD-10-CM

## 2013-08-13 DIAGNOSIS — G4700 Insomnia, unspecified: Secondary | ICD-10-CM

## 2013-08-13 DIAGNOSIS — S63641A Sprain of metacarpophalangeal joint of right thumb, initial encounter: Secondary | ICD-10-CM

## 2013-08-13 DIAGNOSIS — S5331XA Traumatic rupture of right ulnar collateral ligament, initial encounter: Secondary | ICD-10-CM

## 2013-08-13 MED ORDER — ALPRAZOLAM 0.5 MG PO TABS: ORAL_TABLET | ORAL | Status: DC

## 2013-08-13 MED ORDER — ZOLPIDEM TARTRATE 10 MG PO TABS: ORAL_TABLET | ORAL | Status: DC

## 2013-08-13 MED ORDER — AMPHETAMINE-DEXTROAMPHET ER 30 MG PO CP24: 60.0000 mg | ORAL_CAPSULE | ORAL | Status: DC

## 2013-08-13 MED ORDER — MELOXICAM 15 MG PO TABS: 15.0000 mg | ORAL_TABLET | Freq: Every day | ORAL | Status: DC

## 2013-08-13 MED ORDER — AMPHETAMINE-DEXTROAMPHETAMINE 30 MG PO TABS: 45.0000 mg | ORAL_TABLET | Freq: Every day | ORAL | Status: DC

## 2013-08-13 NOTE — Progress Notes (Signed)
   Subjective:    Patient ID: Frank [name], male    DOB: 08/08/1990, 23 y.o.   MRN: [id number]  HPI  Chief Complaint  Patient presents with  . Medication Refill    [facility], all other meds go to his local pharmacy  . thumb pain    right thumb  . skin tag    Neck x 2    -  Narcolepsy  All A's this semster!!!!settled conflict w/ stud affairs  Thumb 35mo pain after fall skiing with thumb bent back  2 growths R neck very annoying--easily traumatized  To Grad in DEC  Review of Systems Complains of a long history of recurrent lumbar pain with radiation to the left leg/occasionally interferes with activity/no sensory or motor changes     Objective:   Physical Exam BP 138/82  Pulse 92  Temp(Src) 97.7 F (36.5 C)  Resp 18  Ht 6' 1.5" (1.867 m)  Wt 285 lb (129.275 kg)  BMI 37.09 kg/m2  SpO2 97% HEENT clear Heart regular without murmur No thyromegaly Chest clear No peripheral edema The right thumb is not swollen but is tender around the MCP. Extensor tendon is nontender.Marland Kitchen. His range of motion shows no tenderness but he has laxity of the ulnar collateral ligament at the MCP joint #1 Neuro intact  Skin with 2 small flesh colored  growths along the right collar line  Mood good and affect appropriate Thought content normal/judgment sound     procedure= The 2 nevi along the right collar line are anesthetized with 2% lidocaine with epinephrine///both are removed scan With scissors and the bases are cauterized with silver nitrite No complications  Assessment & Plan:  Narcolepsy - Plan: amphetamine-dextroamphetamine (ADDERALL XR) 30 MG 24 hr capsule, amphetamine-dextroamphetamine (ADDERALL) 30 MG tablet, zolpidem (AMBIEN) 10 MG tablet  Sacroiliac joint dysfunction of left side - Plan: meloxicam (MOBIC) 15 MG tablet  Gamekeeper's thumb of right hand, initial encounter - Plan: Ambulatory referral to Orthopedic Surgery  History of OCD (obsessive compulsive  disorder)---with anxiety  Insomnia--secondary  Nevus, non-neoplastic--x2 removed  Meds ordered this encounter  Medications  . amphetamine-dextroamphetamine (ADDERALL XR) 30 MG 24 hr capsule    Sig: Take 2 capsules (60 mg total) by mouth every morning. For 30 days after dates signed    Dispense:  180 capsule    Refill:  0    For narcolepsy  . amphetamine-dextroamphetamine (ADDERALL) 30 MG tablet    Sig: Take 1.5 tablets (45 mg total) by mouth daily. For narcolepsy    Dispense:  135 tablet    Refill:  0  . zolpidem (AMBIEN) 10 MG tablet    Sig: TAKE 1 TABLET BY MOUTH AT BEDTIME AS NEEDED    Dispense:  90 tablet    Refill:  1  . meloxicam (MOBIC) 15 MG tablet    Sig: Take 1 tablet (15 mg total) by mouth daily.    Dispense:  90 tablet    Refill:  3    Order Specific Question:  Supervising Provider    Answer:  Nico Rogness P [[id number]]  . ALPRAZolam (XANAX) 0.5 MG tablet    Sig: TAKE 1 TABLET BY MOUTH TWICE DAILY AS NEEDED FOR ANXIETY    Dispense:  180 tablet    Refill:  1   call3 f-u 6 or mychart

## 2013-09-18 ENCOUNTER — Telehealth: Payer: Self-pay

## 2013-09-18 DIAGNOSIS — G47419 Narcolepsy without cataplexy: Secondary | ICD-10-CM

## 2013-09-18 NOTE — Telephone Encounter (Signed)
Pt called in because his insurance company will not cover the Adderall prescriptions written for more than one month at a time. He needs both the Rx and Tablets to be written for 1 month. He would like to know if this could be [facility] to [facility] on [address] [place] because he is away at school, if not he said that his parent could pick it up for him.

## 2013-09-19 NOTE — Telephone Encounter (Signed)
Please advise 

## 2013-09-22 MED ORDER — AMPHETAMINE-DEXTROAMPHETAMINE 30 MG PO TABS: 45.0000 mg | ORAL_TABLET | Freq: Every day | ORAL | Status: DC

## 2013-09-22 NOTE — Telephone Encounter (Signed)
Meds ordered this encounter  Medications  . amphetamine-dextroamphetamine (ADDERALL) 30 MG tablet    Sig: Take 1.5 tablets (45 mg total) by mouth daily. For narcolepsy    Dispense:  135 tablet    Refill:  0

## 2013-09-23 NOTE — Telephone Encounter (Signed)
Pt notified and rx mailed to pt.  [id number] [place], [place] [id number]

## 2013-09-25 NOTE — Telephone Encounter (Signed)
PA was also needed for Adderall 30 mg , 1.5 QD because quantity limit of #30 in 30 days. I did get approval for #45 in 30 days through 09/25/14, case # [id number].  Dr Merla Richesoolittle, in researching for PA it appears that pt needed a 30 day supply of both meds sent in bc ins will not pay for 90 day supplies. The 90 day you just wrote for pt has already been mailed. Do you want to write for 30 days and mail them to pt? I will have to call him and ask him to destroy or mail back the incorrect script for 90 days when he receives it. I have pended 1 mos of each Rx, but don't know if you want to send any add'l months or not?

## 2013-09-25 NOTE — Addendum Note (Signed)
Addended by: Sheppard PlumberBRIGGS, Riverlyn Kizziah A on: 09/25/2013 04:12 PM   Modules accepted: Orders

## 2013-09-30 ENCOUNTER — Telehealth: Payer: Self-pay

## 2013-09-30 DIAGNOSIS — G47419 Narcolepsy without cataplexy: Secondary | ICD-10-CM

## 2013-09-30 MED ORDER — AMPHETAMINE-DEXTROAMPHETAMINE 30 MG PO TABS: 45.0000 mg | ORAL_TABLET | Freq: Every day | ORAL | Status: DC

## 2013-09-30 MED ORDER — AMPHETAMINE-DEXTROAMPHET ER 30 MG PO CP24: 60.0000 mg | ORAL_CAPSULE | ORAL | Status: DC

## 2013-09-30 NOTE — Telephone Encounter (Signed)
  Meds ordered this encounter  Medications  . amphetamine-dextroamphetamine (ADDERALL XR) 30 MG 24 hr capsule    Sig: Take 2 capsules (60 mg total) by mouth every morning. For 30 days after dates signed    Dispense:  60 capsule    Refill:  0    For narcolepsy  . amphetamine-dextroamphetamine (ADDERALL) 30 MG tablet    Sig: Take 1.5 tablets (45 mg total) by mouth daily. For narcolepsy    Dispense:  45 tablet    Refill:  0  . amphetamine-dextroamphetamine (ADDERALL) 30 MG tablet    Sig: Take 1.5 tablets (45 mg total) by mouth daily. For narcolepsy. For 30 days after date signed    Dispense:  45 tablet    Refill:  0  . amphetamine-dextroamphetamine (ADDERALL XR) 30 MG 24 hr capsule    Sig: Take 2 capsules (60 mg total) by mouth every morning.    Dispense:  60 capsule    Refill:  0    For narcolepsy   Mail [address] [place] [id number].

## 2013-09-30 NOTE — Telephone Encounter (Signed)
Patient states he only received one of his "Adderall" medications in the mail. Per patient he still needs "Adderall 30mg  XR 24 caps". Patient states he has been without this medication for 2 weeks now. Patient requesting it to be mailed to him at [facility] [place] [phone]. Patients call back number is [phone]

## 2013-10-01 NOTE — Telephone Encounter (Signed)
Called pt to notify  Rxs are ready to mail and verified his address. Put in box in MR to mail.

## 2014-02-25 ENCOUNTER — Encounter: Payer: Self-pay | Admitting: Internal Medicine

## 2014-02-25 ENCOUNTER — Ambulatory Visit (INDEPENDENT_AMBULATORY_CARE_PROVIDER_SITE_OTHER): Payer: BLUE CROSS/BLUE SHIELD | Admitting: Internal Medicine

## 2014-02-25 VITALS — BP 130/74 | HR 77 | Temp 98.0°F | Resp 16 | Ht 71.5 in | Wt 259.0 lb

## 2014-02-25 DIAGNOSIS — G47419 Narcolepsy without cataplexy: Secondary | ICD-10-CM

## 2014-02-25 DIAGNOSIS — M545 Low back pain, unspecified: Secondary | ICD-10-CM

## 2014-02-25 DIAGNOSIS — Z8659 Personal history of other mental and behavioral disorders: Secondary | ICD-10-CM

## 2014-02-25 DIAGNOSIS — M5489 Other dorsalgia: Secondary | ICD-10-CM

## 2014-02-25 DIAGNOSIS — F909 Attention-deficit hyperactivity disorder, unspecified type: Secondary | ICD-10-CM

## 2014-02-25 DIAGNOSIS — F988 Other specified behavioral and emotional disorders with onset usually occurring in childhood and adolescence: Secondary | ICD-10-CM

## 2014-02-25 DIAGNOSIS — M7582 Other shoulder lesions, left shoulder: Secondary | ICD-10-CM

## 2014-02-25 MED ORDER — AMPHETAMINE-DEXTROAMPHETAMINE 30 MG PO TABS: 45.0000 mg | ORAL_TABLET | Freq: Every day | ORAL | Status: DC

## 2014-02-25 MED ORDER — AMPHETAMINE-DEXTROAMPHET ER 30 MG PO CP24
60.0000 mg | ORAL_CAPSULE | ORAL | Status: DC
Start: 2014-02-25 — End: 2014-03-04

## 2014-02-25 MED ORDER — ZOLPIDEM TARTRATE 10 MG PO TABS: ORAL_TABLET | ORAL | Status: DC

## 2014-02-25 MED ORDER — ALPRAZOLAM 0.5 MG PO TABS: ORAL_TABLET | ORAL | Status: DC

## 2014-02-25 MED ORDER — AMPHETAMINE-DEXTROAMPHET ER 30 MG PO CP24: 60.0000 mg | ORAL_CAPSULE | ORAL | Status: DC

## 2014-02-25 MED ORDER — MELOXICAM 15 MG PO TABS: 15.0000 mg | ORAL_TABLET | Freq: Every day | ORAL | Status: DC

## 2014-02-25 NOTE — Progress Notes (Signed)
Subjective:    Patient ID: Frank [name], male    DOB: 11/14/1990, 24 y.o.   MRN: [id number]  HPIf/u Patient Active Problem List   Diagnosis Date Noted  . Narcolepsy 07/26/2011    Priority: High  . Insomnia 01/30/2012  . ADD (attention deficit disorder) 08/23/2011  . Acne 08/23/2011  . History of OCD (obsessive compulsive disorder) 08/23/2011  . 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 When he sits down to watch a movie he may fall asleep. His sleep is nonrestorative. He has hypersomnolence throughout the day. He is very worried about this next level of work and whether his medicines need to be changed. While off of Adderall he has no need of Ambien to fall asleep. He has been on stimulants for 10 years. No diagnosis of sleep disorder after sleep study with Dr.Dohmeir///he now has additional history of severe snoring   mile a day at least//incr exercise since grad   Review of Systems L shoulder pops w/ rom and popping hurts x 3 mos//nki No HA No palp or CP No doe No fatigue    Also complaining of decreased hearing and tinnitus in the left ear for 10 days since returning from [place] Has noticed some nasal congestion//no dizziness  Objective:   Physical Exam BP 130/74 mmHg  Pulse 77  Temp(Src) 98 F (36.7 C) (Oral)  Resp 16  Ht 5' 11.5" (1.816 m)  Wt 259 lb (117.482 kg)  BMI 35.62 kg/m2  SpO2 97% TMs clear Nares boggy turbinates/throat clear/no nodes  thyromegaly//large neck with shallow hypopharynx Heart regular Lungs clear Cranial nerves II through XII intact Remainder of neurological intact Left shoulder with full range of motion but with popping throughout the upper levels/popping produced discomfort posteriorly Tender to palpation over the posterior shoulder just above the scapula/no swelling or redness Adduction and abduction against resistance not painful Mood good, affect appropriate, judgment sound, thought content normal     audiogram reveals normal hearing Assessment & Plan:  Shoulder L pain-secondary to rotator cuff tendinitis-range of motion exercises/meloxicam Tinnitus L--secondary to congestion-Sudafed for 10 days Sleep disorder/narcolep/snoring/attention deficit disorder  Reevaluation of ADD with Dr. Derrell LollingIngram  [facility] including multiple sleep latency testing  If this cannot be accomplished prior to his training in [place] on February 2 and we will restart his  medications they got him through college and arrange evaluation once he returns [place]  I left a message with the sleep study center at [facility] to call me back, cell phone Back pain  He is addressing this with chiropractic help at this point  If needed: Meds ordered this encounter  Medications  . meloxicam (MOBIC) 15 MG tablet    Sig: Take 1 tablet (15 mg total) by mouth daily.    Dispense:  30 tablet    Refill:  0  . ALPRAZolam (XANAX) 0.5 MG tablet    Sig: TAKE 1 TABLET BY MOUTH TWICE DAILY  AS NEEDED FOR ANXIETY    Dispense:  180 tablet    Refill:  1  . amphetamine-dextroamphetamine (ADDERALL XR) 30 MG 24 hr capsule    Sig: Take 2 capsules (60 mg total) by mouth every morning. For 30 days after dates signed    Dispense:  60 capsule    Refill:  0    For narcolepsy  . amphetamine-dextroamphetamine (ADDERALL XR) 30 MG 24 hr capsule    Sig: Take 2 capsules (60 mg total) by mouth  every morning.    Dispense:  60 capsule    Refill:  0    For narcolepsy  . amphetamine-dextroamphetamine (ADDERALL) 30 MG tablet    Sig: Take 1.5 tablets by mouth daily. For narcolepsy    Dispense:  45 tablet    Refill:  0  . amphetamine-dextroamphetamine (ADDERALL) 30 MG tablet    Sig: Take 1.5 tablets by mouth daily. For narcolepsy. For 30 days after date signed    Dispense:  45 tablet    Refill:  0  . zolpidem (AMBIEN) 10 MG tablet    Sig: TAKE 1 TABLET BY MOUTH AT BEDTIME AS NEEDED    Dispense:  90 tablet    Refill:  1   follow-up 1/27

## 2014-03-02 ENCOUNTER — Telehealth: Payer: Self-pay | Admitting: Internal Medicine

## 2014-03-02 DIAGNOSIS — F988 Other specified behavioral and emotional disorders with onset usually occurring in childhood and adolescence: Secondary | ICD-10-CM

## 2014-03-02 DIAGNOSIS — G479 Sleep disorder, unspecified: Secondary | ICD-10-CM

## 2014-03-02 DIAGNOSIS — R419 Unspecified symptoms and signs involving cognitive functions and awareness: Secondary | ICD-10-CM

## 2014-03-02 DIAGNOSIS — G47419 Narcolepsy without cataplexy: Secondary | ICD-10-CM

## 2014-03-02 DIAGNOSIS — G4700 Insomnia, unspecified: Secondary | ICD-10-CM

## 2014-03-02 NOTE — Telephone Encounter (Signed)
[facility]--[name] has appt for sleep study and MSLT on 26th!!! Fax ref to [phone]

## 2014-03-04 ENCOUNTER — Encounter: Payer: Self-pay | Admitting: Neurology

## 2014-03-04 ENCOUNTER — Ambulatory Visit (INDEPENDENT_AMBULATORY_CARE_PROVIDER_SITE_OTHER): Payer: BLUE CROSS/BLUE SHIELD | Admitting: Neurology

## 2014-03-04 VITALS — BP 115/67 | HR 80 | Resp 14 | Ht 72.0 in | Wt 266.5 lb

## 2014-03-04 DIAGNOSIS — E663 Overweight: Secondary | ICD-10-CM | POA: Insufficient documentation

## 2014-03-04 DIAGNOSIS — G4719 Other hypersomnia: Secondary | ICD-10-CM | POA: Insufficient documentation

## 2014-03-04 DIAGNOSIS — R0683 Snoring: Secondary | ICD-10-CM | POA: Insufficient documentation

## 2014-03-04 NOTE — Patient Instructions (Signed)
Narcolepsy Narcolepsy is a nervous system disorder that causes daytime sleepiness and sudden bouts of irresistible sleep during the day (sleep attacks). Many people with narcolepsy live with extreme daytime sleepiness for many years before being diagnosed and treated. Narcolepsy is a lifelong (chronic) disorder.  You normally go through cycles when you sleep. When your sleep becomes deeper, you have less body movement, and you start dreaming. This type of deep sleep should happen after about 90 minutes of lighter sleep. Deep sleep is called rapid eye movement (REM) sleep. When you have narcolepsy, REM is not well regulated. It can happen as soon as you fall asleep, and components of REM sleep can occur during the day when you are awake. Uncontrolled REM sleep causes symptoms of narcolepsy. CAUSES Narcolepsy may be caused by an abnormality with a chemical messenger (neurotransmitter) in your brain that controls your sleep and wake cycles. Most people with narcolepsy have low levels of the neurotransmitter hypocretin. Hypocretin is important for controlling wakefulness.  A hypocretin imbalance may be caused by abnormal genes that are passed down through families. It may also develop if the body's defense system (immune system) mistakenly attacks the brain cells that produce hypocretin (autoimmune disease). RISK FACTORS You may be at higher risk for narcolepsy if you have a family history of the disease. Other risk factors that may contribute include:  Injuries, tumors, or infections in the areas of the brain that control sleep.  Exposure to toxins.  Stress.  Hormones produced during puberty and menopause.  Poor sleep habits. SIGNS AND SYMPTOMS  Symptoms of narcolepsy can start at any age but usually begin when people are between the ages of 7 and 25 years. There are four major symptoms. Not everyone with narcolepsy will have all four.   Excessive daytime sleepiness. This is the most common symptom  and is usually the first symptom you will notice.  You may feel as if you are in a mental fog.  Daytime sleepiness may severely affect your performance at work or school.  You may fall asleep during a conversation or while eating dinner.  Sudden loss of muscle tone (cataplexy). You do not lose consciousness, but you may suddenly lose muscle control. When this occurs, your speech may become slurred, or your knees may buckle. This symptom is usually triggered by surprise, anger, or laughter.  Sleep paralysis. You may lose the ability to speak or move just as you start to fall asleep or wake up. You will be aware of the paralysis. It usually lasts for just a few seconds or minutes.  Vivid hallucinations. These may occur with sleep paralysis. The hallucinations are like having bizarre or frightening dreams while you are still awake. Other symptoms may include:   Trouble staying asleep at night (insomnia).  Restless sleep.  Feeling a strong urge to get up at night to smoke or eat. DIAGNOSIS  Your health care provider can diagnose narcolepsy based on your symptoms and the results of two diagnostic tests. You may also be asked to keep a sleep diary for several weeks. You may need the tests if you have had daytime sleepiness for at least 3 months. The two tests are:   A polysomnogram to find out how well your REM sleep is regulated at night. This test is an overnight sleep study. It measures your heart rate, breathing, movement, and brain waves.  A multiple sleep latency test (MSLT) to find out how well your REM sleep is regulated during the day. This   is a daytime sleep study. You may need to take several naps during the day. This test also measures your heart rate, breathing, movement, and brain waves. TREATMENT  There is no cure for narcolepsy, but treatment can be very effective in helping manage the condition. Treatment may include:  Lifestyle and sleeping strategies to help cope with the  condition.  Medicines. These may include:  Stimulant medicines to fight daytime sleepiness.  Antidepressant medicines to treat cataplexy.  Sodium oxybate. This is a strong sedative that you take at night. It can help daytime sleepiness and cataplexy. HOME CARE INSTRUCTIONS  Take all medicines as directed by your health care provider.  Follow these sleep practices:  Try to get about 8 hours of sleep every night.  Go to sleep and get up close to the same time every day.  Keep your bedroom dark, quiet, and comfortable.  Schedule short naps for when you feel sleepiest during the day. Tell your employer or teachers that you have narcolepsy. You may be able to adjust your schedule to include time for naps.  Try to get at least 20 minutes of exercise every day. This will help you sleep better at night and reduce daytime sleepiness.  Do not drink alcohol or caffeinated beverages within 4-5 hours of bedtime.  Do not eat a heavy meal before bedtime. Eat at about the same times every day.  Do not smoke.  Do not drive if you are sleepy or have untreated narcolepsy.  Do not swim or go out on the water without a life jacket. SEEK MEDICAL CARE IF: Your medicines are not controlling your narcolepsy symptoms. SEEK IMMEDIATE MEDICAL CARE IF:  You hurt yourself during a sleep attack or an attack of cataplexy.  You have chest pain or trouble breathing. Document Released: 01/20/2002 Document Revised: 06/16/2013 Document Reviewed: 01/22/2013 ExitCare Patient Information 2015 [employer]. This information is not intended to replace advice given to you by your health care provider. Make sure you discuss any questions you have with your health care provider.  

## 2014-03-04 NOTE — Progress Notes (Signed)
Provider:  Larey Seat, M D  Referring Provider: Leandrew Koyanagi, MD Primary Care Physician:  Leandrew Koyanagi, MD  Chief Complaint  Patient presents with  . NP sleep consult  Laney Pastor)    Rm 10, alone    HPI:  Frank [name] is a 24 y.o. male seen here as a referral  from Dr. Laney Pastor for a sleep consultation,  Mr. Dipaola had been seen in our sleep clinic approximately for 5 years ago and was evaluated for hypersomnia by a sleep study diagnosed with a minor case of apnea. He reports today that he is still very sleepy and ready to take a nap whenever not stimulated or physically inactive.  On 09-20-09 the patient was referred by Dr. Tami Lin with an Epworth sleepiness score of 11 points a BMI of 34. The Becks depression inventory was also endorsed at 15 points which was elevated for a young man of 81. Patient had a AHI of 4.6 nearly all of some REM related. There was also a strong positional component all apneas to place and supine sleep. The patient then followed with an MS LT the following day the mean sleep latency was 1.0 minutes he slept in 5 out of 5 naps but he did not reach any REM sleep. This was deemed to reflect extreme hypersomnia and possible narcolepsy as he has been treated with Adderall.  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 This is an ideal time to make sure that he is prepared for a valid MS LT test by weaning him off Adderall for at least 7 days. There is a high likelihood of rebound fatigue but not necessarily hypersomnia. He does not dream frequently and certainly not vivid. The patient stopped the Adderall already he reported 10 days ago, noted memory and focus f difficulties.  ADHD /ADD/. He can immediately start to have PSG and MSLT on 03-10-14 and 03-01-14.  If we end up with a similarly " twisted" result, I will order the HLA test.   He endorsed the Epworth sleepiness score at 8 points which  is not that high and the fatigue severity scale at 32 points which is elevated but not very high.   His sleep habits are as follows He usually goes to bed between 11:30 to midnight. On average she may take 10 minutes to fall asleep and it isolated nights it may take him much longer. Usually he sleeps through the night into the morning hours when he wakes up however he has no trouble to reinitiate sleep. He rises in the morning at 7:30, he has to rely on an alarm or several of these. During his school years his mother had to wake him up multiple times before he would finally rise this has essentially not changed. He estimates however that he gets about 7 hours of sleep nocturnally. He reports that he could sleep much more than that and that he often has an inadvertent unplanned nap. A nap the last about 15-40 minutes and is usually perceived as very refreshing-  the shorter the  nap the more refreshing the results He uses a fit bit and has been finding recordings of up to 18 periods of higher muscle activity and motion during the night. hus bed looks "as if struck by a hurricane".   He has gained weight since last encounter in 2011.   Review of Systems: no dream intrusion, no sleep paralysis,  had cataplectic attacks when angry.  Sleep talker, micro sleeper. Out of a complete 14 system review, the patient complains of only the following symptoms, and all other reviewed systems are negative, sleepiness, loss of motivation, highly fatigued, weight gain, irritable .   History   Social History  . Marital Status: Single    Spouse Name: N/A    Number of Children: N/A  . Years of Education: N/A   Occupational History  . Not on file.   Social History Main Topics  . Smoking status: Never Smoker   . Smokeless tobacco: Not on file  . Alcohol Use: 4.2 oz/week    7 Not specified per week  . Drug Use: No  . Sexual Activity: Yes   Other Topics Concern  . Not on file   Social History Narrative    Caffeine 1-2 cups a week    Family History  Problem Relation Age of Onset  . Stroke Maternal Grandmother   . Cancer Paternal Grandmother   . Hypertension Paternal Grandmother   . Diabetes Paternal Grandfather     No past medical history on file.  Past Surgical History  Procedure Laterality Date  . Wisdom tooth extraction  2013  . Knee surgery      Current Outpatient Prescriptions  Medication Sig Dispense Refill  . ALPRAZolam (XANAX) 0.5 MG tablet TAKE 1 TABLET BY MOUTH TWICE DAILY AS NEEDED FOR ANXIETY 180 tablet 1  . amphetamine-dextroamphetamine (ADDERALL XR) 30 MG 24 hr capsule Take 2 capsules (60 mg total) by mouth every morning. For 30 days after dates signed 60 capsule 0  . amphetamine-dextroamphetamine (ADDERALL XR) 30 MG 24 hr capsule Take 2 capsules (60 mg total) by mouth every morning. 60 capsule 0  . amphetamine-dextroamphetamine (ADDERALL) 30 MG tablet Take 1.5 tablets by mouth daily. For narcolepsy 45 tablet 0  . amphetamine-dextroamphetamine (ADDERALL) 30 MG tablet Take 1.5 tablets by mouth daily. For narcolepsy. For 30 days after date signed 45 tablet 0  . meloxicam (MOBIC) 15 MG tablet Take 1 tablet (15 mg total) by mouth daily. 30 tablet 0  . zolpidem (AMBIEN) 10 MG tablet TAKE 1 TABLET BY MOUTH AT BEDTIME AS NEEDED 90 tablet 1   No current facility-administered medications for this visit.    Allergies as of 03/04/2014  . (No Known Allergies)    Vitals: BP 115/67 mmHg  Pulse 80  Resp 14  Ht 6' (1.829 m)  Wt 266 lb 8 oz (120.884 kg)  BMI 36.14 kg/m2 Last Weight:  Wt Readings from Last 1 Encounters:  03/04/14 266 lb 8 oz (120.884 kg)   Last Height:   Ht Readings from Last 1 Encounters:  03/04/14 6' (1.829 m)    Physical exam:  General: The patient is awake, alert and appears not in acute distress. The patient is well groomed. Head: Normocephalic, atraumatic. Neck is supple. Mallampati 3 , neck circumference: 17.5 inches.  No nasal congestion and  no  TMJ, has on occasion Bruxism. Cardiovascular:  Regular rate and rhythm, without  murmurs or carotid bruit, and without distended neck veins. Respiratory: Lungs are clear to auscultation. Skin:  Without evidence of edema, or rash Trunk: BMI is  elevated and patient has normal posture.  Neurologic exam : The patient is awake and alert, oriented to place and time.  Memory subjective described as intact.  There is a normal attention span & concentration ability. Speech is fluent without  dysarthria, dysphonia or aphasia.  Mood and affect are appropriate.  Cranial nerves: Pupils are equal and briskly reactive to light. Funduscopic exam without evidence of pallor or edema.  Extraocular movements  in vertical and horizontal planes intact and without nystagmus.  Visual fields by finger perimetry are intact. Hearing to finger rub intact.  Facial sensation intact to fine touch. Facial motor strength is symmetric and tongue and uvula move midline.  Tongue protrusion into either cheek is normal. Shoulder shrug is normal.   Motor exam: Normal tone ,muscle bulk and symmetric strength in all extremities.  Sensory:  Fine touch, pinprick and vibration were tested in all extremities. Proprioception was normal.  Coordination: Rapid alternating movements in the fingers/hands were normal. Finger-to-nose maneuver normal without evidence of ataxia, dysmetria or tremor.  Gait and station: Patient walks without assistive device and is able unassisted to climb up to the exam table. Strength within normal limits.  Stance is stable and normal. Tandem gait is unfragmented. Romberg testing is negative   Deep tendon reflexes: in the  upper and lower extremities are symmetric and intact. Babinski maneuver response is downgoing.   Assessment:  After physical and neurologic examination, review of laboratory studies, imaging, neurophysiology testing and pre-existing records, assessment is that of :  1) extreme  hypersomnia in spite of power naps, the score on Epworth of 8 points is in my opinion  underestimated.  2) likely overlap of ADD or ADHD , as he reports symptoms while off Adderall.  Had still  naps and micro-sleeps on Adderall.  3) obesity- all this can relate to hypocretin /orexin deficiency.        Plan:  Treatment plan and additional workup : PSG and MSLT already ordered. BMI management addressed.   HLA test , RN to draw.      Asencion Partridge Denzell Colasanti MD 03/04/2014

## 2014-03-05 ENCOUNTER — Encounter: Payer: Self-pay | Admitting: Neurology

## 2014-03-06 ENCOUNTER — Institutional Professional Consult (permissible substitution): Payer: BLUE CROSS/BLUE SHIELD | Admitting: Neurology

## 2014-03-10 ENCOUNTER — Ambulatory Visit (INDEPENDENT_AMBULATORY_CARE_PROVIDER_SITE_OTHER): Payer: BLUE CROSS/BLUE SHIELD | Admitting: Neurology

## 2014-03-10 DIAGNOSIS — R0683 Snoring: Secondary | ICD-10-CM

## 2014-03-10 DIAGNOSIS — G4719 Other hypersomnia: Secondary | ICD-10-CM

## 2014-03-10 DIAGNOSIS — E663 Overweight: Secondary | ICD-10-CM

## 2014-03-10 NOTE — Sleep Study (Signed)
Please see the scanned sleep study interpretation located in the Procedure tab within the Chart Review section. 

## 2014-03-11 ENCOUNTER — Ambulatory Visit: Payer: Self-pay | Admitting: Internal Medicine

## 2014-03-13 ENCOUNTER — Encounter: Payer: Self-pay | Admitting: Neurology

## 2014-03-16 ENCOUNTER — Telehealth: Payer: Self-pay | Admitting: *Deleted

## 2014-03-16 NOTE — Telephone Encounter (Signed)
Patient was contacted and provided the results of his NPSG/MSLT that showed idiopathic hypersomnolence with very mild sleep apnea.  Patient was advised that a follow up appointment needed to be scheduled to go over treatment options and how to manage the excessive sleepiness.  Due to work obligations he could not schedule while over the phone.  Patient advised to contact our office back to schedule the follow up appointment.  Dr. Ellamae Siaobert Doolittle was faxed a copy of the results.

## 2014-03-17 ENCOUNTER — Telehealth: Payer: Self-pay | Admitting: *Deleted

## 2014-03-17 NOTE — Telephone Encounter (Signed)
I called and spoke to pt and relayed the narcolepsy test as negative/normal.  He verbalized understanding.

## 2014-03-17 NOTE — Progress Notes (Signed)
Quick Note:  I called pt and relayed the negative result of the narcolepsy test. Pt verbalized understanding. ______

## 2014-04-22 ENCOUNTER — Other Ambulatory Visit: Payer: Self-pay | Admitting: Physician Assistant

## 2014-06-12 ENCOUNTER — Telehealth: Payer: Self-pay

## 2014-06-12 DIAGNOSIS — G47419 Narcolepsy without cataplexy: Secondary | ICD-10-CM

## 2014-06-12 NOTE — Telephone Encounter (Signed)
ALPRAZolam (XANAX) 0.5 MG  amphetamine-dextroamphetamine (ADDERALL XR) 30 MG 24 hr capsule amphetamine-dextroamphetamine (ADDERALL) 30 MG tablet meloxicam (MOBIC) 15 MG tablet zolpidem (AMBIEN) 10 MG tablet  Refill Requests  [phone]

## 2014-06-15 MED ORDER — ZOLPIDEM TARTRATE 10 MG PO TABS
ORAL_TABLET | ORAL | Status: DC
Start: 2014-06-15 — End: 2015-01-03

## 2014-06-15 MED ORDER — AMPHETAMINE-DEXTROAMPHETAMINE 30 MG PO TABS: 45.0000 mg | ORAL_TABLET | Freq: Every day | ORAL | Status: DC

## 2014-06-15 MED ORDER — AMPHETAMINE-DEXTROAMPHET ER 30 MG PO CP24: 60.0000 mg | ORAL_CAPSULE | ORAL | Status: DC

## 2014-06-15 MED ORDER — ALPRAZOLAM 0.5 MG PO TABS: ORAL_TABLET | ORAL | Status: DC

## 2014-06-15 MED ORDER — MELOXICAM 15 MG PO TABS: 15.0000 mg | ORAL_TABLET | Freq: Every day | ORAL | Status: DC

## 2014-06-15 NOTE — Telephone Encounter (Signed)
Meds ordered this encounter  Medications  . ALPRAZolam (XANAX) 0.5 MG tablet    Sig: TAKE 1 TABLET BY MOUTH TWICE DAILY AS NEEDED FOR ANXIETY    Dispense:  180 tablet    Refill:  1  . amphetamine-dextroamphetamine (ADDERALL XR) 30 MG 24 hr capsule    Sig: Take 2 capsules (60 mg total) by mouth every morning. For 30 days after dates signed    Dispense:  60 capsule    Refill:  0    For narcolepsy  . amphetamine-dextroamphetamine (ADDERALL) 30 MG tablet    Sig: Take 1.5 tablets by mouth daily. For narcolepsy    Dispense:  45 tablet    Refill:  0  . meloxicam (MOBIC) 15 MG tablet    Sig: Take 1 tablet (15 mg total) by mouth daily.    Dispense:  30 tablet    Refill:  4  . zolpidem (AMBIEN) 10 MG tablet    Sig: TAKE 1 TABLET BY MOUTH AT BEDTIME AS NEEDED    Dispense:  90 tablet    Refill:  1

## 2014-06-16 NOTE — Telephone Encounter (Signed)
Pt notified that rx is ready. 

## 2014-06-27 ENCOUNTER — Encounter: Payer: Self-pay | Admitting: Internal Medicine

## 2014-08-06 ENCOUNTER — Telehealth: Payer: Self-pay | Admitting: Internal Medicine

## 2014-08-06 ENCOUNTER — Encounter: Payer: Self-pay | Admitting: Internal Medicine

## 2014-08-06 DIAGNOSIS — G47419 Narcolepsy without cataplexy: Secondary | ICD-10-CM

## 2014-08-06 DIAGNOSIS — H9319 Tinnitus, unspecified ear: Secondary | ICD-10-CM

## 2014-08-07 ENCOUNTER — Telehealth: Payer: Self-pay

## 2014-08-07 MED ORDER — AMPHETAMINE-DEXTROAMPHETAMINE 30 MG PO TABS: 45.0000 mg | ORAL_TABLET | Freq: Every day | ORAL | Status: DC

## 2014-08-07 MED ORDER — AMPHETAMINE-DEXTROAMPHET ER 30 MG PO CP24: 60.0000 mg | ORAL_CAPSULE | ORAL | Status: DC

## 2014-08-07 NOTE — Addendum Note (Signed)
Addended by: Tonye Pearson on: 08/07/2014 02:10 PM   Modules accepted: Orders

## 2014-08-07 NOTE — Telephone Encounter (Signed)
Patient is requesting in addiction to his Adderral prescriptions to have "Zolpidem" and "Meloxicam" refilled as well. He would like to pick up all his prescriptions at the same time. Please contact patient at [phone] when ready.

## 2014-09-03 ENCOUNTER — Other Ambulatory Visit: Payer: Self-pay | Admitting: Internal Medicine

## 2014-09-03 DIAGNOSIS — G47419 Narcolepsy without cataplexy: Secondary | ICD-10-CM

## 2014-09-04 MED ORDER — AMPHETAMINE-DEXTROAMPHET ER 30 MG PO CP24: 60.0000 mg | ORAL_CAPSULE | Freq: Every day | ORAL | Status: DC

## 2014-09-04 MED ORDER — AMPHETAMINE-DEXTROAMPHETAMINE 30 MG PO TABS: 45.0000 mg | ORAL_TABLET | Freq: Every day | ORAL | Status: DC

## 2014-09-04 MED ORDER — AMPHETAMINE-DEXTROAMPHET ER 30 MG PO CP24: 60.0000 mg | ORAL_CAPSULE | ORAL | Status: DC

## 2014-09-04 NOTE — Addendum Note (Signed)
Addended by: Tonye Pearson on: 09/04/2014 12:04 PM   Modules accepted: Orders

## 2014-09-04 NOTE — Telephone Encounter (Signed)
Rxs are in drawer. Sent message to pt on MyChart that ready and asked him to contact us if he wants [place] to mail them.

## 2014-10-02 DIAGNOSIS — M40204 Unspecified kyphosis, thoracic region: Secondary | ICD-10-CM | POA: Insufficient documentation

## 2014-10-02 DIAGNOSIS — M5134 Other intervertebral disc degeneration, thoracic region: Secondary | ICD-10-CM | POA: Insufficient documentation

## 2014-10-12 DIAGNOSIS — M7918 Myalgia, other site: Secondary | ICD-10-CM | POA: Insufficient documentation

## 2014-12-14 ENCOUNTER — Other Ambulatory Visit: Payer: Self-pay | Admitting: Neurosurgery

## 2014-12-14 DIAGNOSIS — M4200 Juvenile osteochondrosis of spine, site unspecified: Secondary | ICD-10-CM

## 2014-12-21 ENCOUNTER — Other Ambulatory Visit: Payer: No Typology Code available for payment source

## 2014-12-25 ENCOUNTER — Ambulatory Visit
Admission: RE | Admit: 2014-12-25 | Discharge: 2014-12-25 | Disposition: A | Discharge status: Home / Self Care | Payer: PRIVATE HEALTH INSURANCE | Source: Ambulatory Visit | Attending: Neurosurgery | Admitting: Neurosurgery

## 2014-12-25 VITALS — BP 149/78 | HR 57

## 2014-12-25 DIAGNOSIS — M5134 Other intervertebral disc degeneration, thoracic region: Secondary | ICD-10-CM

## 2014-12-25 DIAGNOSIS — M4980 Spondylopathy in diseases classified elsewhere, site unspecified: Principal | ICD-10-CM

## 2014-12-25 DIAGNOSIS — M4200 Juvenile osteochondrosis of spine, site unspecified: Secondary | ICD-10-CM

## 2014-12-25 MED ORDER — IOHEXOL 300 MG/ML  SOLN
10.0000 mL | Freq: Once | INTRAMUSCULAR | Status: DC | PRN
  Administered 2014-12-25: 10 mL via INTRATHECAL

## 2014-12-25 MED ORDER — ONDANSETRON HCL 4 MG/2ML IJ SOLN
4.0000 mg | Freq: Once | INTRAMUSCULAR | Status: AC
  Administered 2014-12-25: 4 mg via INTRAMUSCULAR

## 2014-12-25 MED ORDER — MEPERIDINE HCL 100 MG/ML IJ SOLN
100.0000 mg | Freq: Once | INTRAMUSCULAR | Status: AC
  Administered 2014-12-25: 100 mg via INTRAMUSCULAR

## 2014-12-25 MED ORDER — DIAZEPAM 5 MG PO TABS
10.0000 mg | ORAL_TABLET | Freq: Once | ORAL | Status: AC
  Administered 2014-12-25: 10 mg via ORAL

## 2014-12-25 NOTE — Progress Notes (Signed)
Pt states he has been off Adderall and Tramadol for the past 2 days. Discharge instructions explained to pt.

## 2014-12-25 NOTE — Discharge Instructions (Signed)
Myelogram Discharge Instructions  1. Go home and rest quietly for the next 24 hours.  It is important to lie flat for the next 24 hours.  Get up only to go to the restroom.  You may lie in the bed or on a couch on your back, your stomach, your left side or your right side.  You may have one pillow under your head.  You may have pillows between your knees while you are on your side or under your knees while you are on your back.  2. DO NOT drive today.  Recline the seat as far back as it will go, while still wearing your seat belt, on the way home.  3. You may get up to go to the bathroom as needed.  You may sit up for 10 minutes to eat.  You may resume your normal diet and medications unless otherwise indicated.  Drink lots of extra fluids today and tomorrow.  4. The incidence of headache, nausea, or vomiting is about 5% (one in 20 patients).  If you develop a headache, lie flat and drink plenty of fluids until the headache goes away.  Caffeinated beverages may be helpful.  If you develop severe nausea and vomiting or a headache that does not go away with flat bed rest, call [phone].  5. You may resume normal activities after your 24 hours of bed rest is over; however, do not exert yourself strongly or do any heavy lifting tomorrow. If when you get up you have a headache when standing, go back to bed and force fluids for another 24 hours.  6. Call your physician for a follow-up appointment.  The results of your myelogram will be sent directly to your physician by the following day.  7. If you have any questions or if complications develop after you arrive home, please call [phone].  Discharge instructions have been explained to the patient.  The patient, or the person responsible for the patient, fully understands these instructions.       May resume Adderall and Tramadol on Nov. 12, 2016, after 9:30 am.

## 2015-01-01 ENCOUNTER — Other Ambulatory Visit: Payer: Self-pay | Admitting: Internal Medicine

## 2015-01-03 ENCOUNTER — Ambulatory Visit (INDEPENDENT_AMBULATORY_CARE_PROVIDER_SITE_OTHER): Payer: 59 | Admitting: Internal Medicine

## 2015-01-03 VITALS — BP 120/80 | HR 71 | Temp 98.3°F | Resp 18 | Ht 72.0 in | Wt 266.2 lb

## 2015-01-03 DIAGNOSIS — J4520 Mild intermittent asthma, uncomplicated: Secondary | ICD-10-CM | POA: Diagnosis not present

## 2015-01-03 DIAGNOSIS — M5134 Other intervertebral disc degeneration, thoracic region: Secondary | ICD-10-CM

## 2015-01-03 DIAGNOSIS — M4200 Juvenile osteochondrosis of spine, site unspecified: Secondary | ICD-10-CM | POA: Diagnosis not present

## 2015-01-03 DIAGNOSIS — G47419 Narcolepsy without cataplexy: Secondary | ICD-10-CM

## 2015-01-03 MED ORDER — AZITHROMYCIN 250 MG PO TABS: ORAL_TABLET | ORAL | Status: DC

## 2015-01-03 MED ORDER — AMPHETAMINE-DEXTROAMPHETAMINE 30 MG PO TABS: 45.0000 mg | ORAL_TABLET | Freq: Every day | ORAL | Status: DC

## 2015-01-03 MED ORDER — AMPHETAMINE-DEXTROAMPHET ER 30 MG PO CP24: 60.0000 mg | ORAL_CAPSULE | Freq: Every day | ORAL | Status: DC

## 2015-01-03 MED ORDER — PREDNISONE 20 MG PO TABS: ORAL_TABLET | ORAL | Status: DC

## 2015-01-03 MED ORDER — ALPRAZOLAM 0.5 MG PO TABS: ORAL_TABLET | ORAL | Status: DC

## 2015-01-03 MED ORDER — ZOLPIDEM TARTRATE 10 MG PO TABS: ORAL_TABLET | ORAL | Status: DC

## 2015-01-03 MED ORDER — AMPHETAMINE-DEXTROAMPHET ER 30 MG PO CP24: 60.0000 mg | ORAL_CAPSULE | ORAL | Status: DC

## 2015-01-03 NOTE — Progress Notes (Signed)
Subjective:  This chart was scribed for Frank Siaobert Jaylena Holloway, MD by [employer] LLC[employer]adim Abu Hashem, medical scribe at [facility].The patient was seen in exam room 08 and the patient's care was started at 1:37 PM.   Patient ID: Frank [name] [place], male    DOB: 11/29/1990, 24 y.o.   MRN: [id number] Chief Complaint  Patient presents with  . Medication Refill  . Depression/Anx/Narcolepsy/insomnia    per triage screening-was recently dx with degenerative spinal disease   HPI  HPI Comments: Frank [name] Dillen is a 24 y.o. male ,well known to me since early high school, who presents to [facility] for a medication refill. Also he is concerned about a cough and tinnitus. The tinnitus has been present for over one year, which only occurs in silence. The cough is deep within his chest,  Productive, and causing some chest tightness. No fever or sore throat.  Recently diagnosed with Scheuermann's disease. Having chronic back pain which has affected work and sleep for at least the last 2 years. He has been evaluated by numerous orthopedics and only recently neurosurgery evaluation by Dr. Jordan LikesPool was an answer found.. Pain with movement. Several avenues of treatment have been unhelpful  Patient Active Problem List   Diagnosis Date Noted  . Narcolepsy 07/26/2011    Priority: High  . Myofascial pain 10/12/2014  . Kyphosis of thoracic region 10/02/2014  . Degeneration of intervertebral disc of thoracic region 10/02/2014  . Excessive daytime sleepiness 03/04/2014  . Patient overweight 03/04/2014  . Snoring 03/04/2014  . Insomnia 01/30/2012  . ADD (attention deficit disorder) 08/23/2011  . Acne 08/23/2011  . History of OCD (obsessive compulsive disorder) 08/23/2011  . BMI 38.0-38.9,adult 08/23/2011    Prior to Admission medications   Medication Sig Start Date End Date Taking? Authorizing Provider  ALPRAZolam Prudy Feeler(XANAX) 0.5 MG tablet TAKE 1 TABLET BY MOUTH TWICE DAILY AS NEEDED FOR ANXIETY  06/15/14 Uses 1-2 times a week Yes Tonye Pearsonobert P Chao Blazejewski, MD  amphetamine-dextroamphetamine (ADDERALL XR) 30 MG 24 hr capsule Take 30 mg by mouth daily.   Yes Historical Provider, MD  amphetamine-dextroamphetamine (ADDERALL) 30 MG tablet Take 1.5 tablets by mouth daily. For 60 days after signed 09/04/14  Yes Tonye Pearsonobert P Tarick Parenteau, MD  meloxicam (MOBIC) 15 MG tablet Take 1 tablet (15 mg total) by mouth daily. 06/15/14  Yes Tonye Pearsonobert P Hilary Pundt, MD  pseudoephedrine (SUDAFED) 30 MG tablet Take 30 mg by mouth every 4 (four) hours as needed for congestion.   Yes Historical Provider, MD  zolpidem (AMBIEN) 10 MG tablet TAKE 1 TABLET BY MOUTH AT BEDTIME AS NEEDED 06/15/14  Yes Tonye Pearsonobert P Rechelle Niebla, MD   No Known Allergies  Review of Systems  HENT: Positive for tinnitus. Negative for sore throat.   Respiratory: Positive for cough.   Musculoskeletal: Positive for back pain.   tinnitus is chronic and not progressive//audiology was normal about a year ago    Objective:  BP 120/80 mmHg  Pulse 71  Temp(Src) 98.3 F (36.8 C) (Oral)  Resp 18  Ht 6' (1.829 m)  Wt 266 lb 4 oz (120.77 kg)  BMI 36.10 kg/m2  SpO2 98% Physical Exam  Constitutional: He is oriented to person, place, and time. He appears well-developed and well-nourished. No distress.  HENT:  Head: Normocephalic and atraumatic.  Right Ear: External ear normal.  Left Ear: External ear normal.  Nose: Nose normal.  Mouth/Throat: Oropharynx is clear and moist.  Eyes: Conjunctivae and EOM are normal. Pupils  are equal, round, and reactive to light.  Neck: Normal range of motion. No thyromegaly present.  Cardiovascular: Normal rate, regular rhythm, normal heart sounds and intact distal pulses.   No murmur heard. Pulmonary/Chest: Effort normal.  Wheezing bilat w/ forc expir  Musculoskeletal: Normal range of motion. He exhibits no edema.  Lymphadenopathy:    He has no cervical adenopathy.  Neurological: He is alert and oriented to person, place, and time. No  cranial nerve deficit.  Skin: Skin is warm and dry.  Psychiatric: He has a normal mood and affect. His behavior is normal. Judgment and thought content normal.  Nursing note and vitals reviewed.     Assessment & Plan:  Narcolepsy and/or ADD - Plan: Medications refilled . After his back situation is stabilized we will reevaluate this problem with another neurological sleep consult and probably. It is unclear if narcolepsy, EPAP insomnia, attention deficit are all part of the same disorder. Fortunately he has responded to treatment well enough to be successful in school and successful his new job. Last neuro eval/sleep study 1/16(NPSG/MSLT that showed idiopathic hypersomnolence with very mild sleep apnea.)  Degeneration of intervertebral disc of thoracic region Scheuermann's kyphosis  - Plan: Ambulatory referral to Neurosurgery Dr Gearlean Alf  Reactive airway disease secondary to lower respiratory infection - zith/pred  Meds ordered this encounter  Medications  . azithromycin (ZITHROMAX) 250 MG tablet    Sig: As packaged    Dispense:  6 tablet    Refill:  0  . predniSONE (DELTASONE) 20 MG tablet    Sig: 3/3/2/2/1/1 single daily dose for 6 days    Dispense:  12 tablet    Refill:  0  . ALPRAZolam (XANAX) 0.5 MG tablet    Sig: TAKE 1 TABLET BY MOUTH TWICE DAILY AS NEEDED FOR ANXIETY    Dispense:  60 tablet    Refill:  5  . zolpidem (AMBIEN) 10 MG tablet    Sig: TAKE 1 TABLET BY MOUTH AT BEDTIME AS NEEDED    Dispense:  90 tablet    Refill:  1  . amphetamine-dextroamphetamine (ADDERALL XR) 30 MG 24 hr capsule    Sig: Take 2 capsules (60 mg total) by mouth daily. For 30d after signed    Dispense:  60 capsule    Refill:  0  . amphetamine-dextroamphetamine (ADDERALL XR) 30 MG 24 hr capsule    Sig: Take 2 capsules (60 mg total) by mouth daily.    Dispense:  60 capsule    Refill:  0  . amphetamine-dextroamphetamine (ADDERALL XR) 30 MG 24 hr capsule    Sig: Take 2 capsules (60 mg total)  by mouth every morning. For 60 days after signed    Dispense:  60 capsule    Refill:  0  . amphetamine-dextroamphetamine (ADDERALL) 30 MG tablet    Sig: Take 1.5 tablets by mouth daily. At 4pm. For 60 days after signed    Dispense:  45 tablet    Refill:  0  . amphetamine-dextroamphetamine (ADDERALL) 30 MG tablet    Sig: Take 1.5 tablets by mouth daily. At 4pm. For 30d after signed    Dispense:  45 tablet    Refill:  0  . amphetamine-dextroamphetamine (ADDERALL) 30 MG tablet    Sig: Take 1.5 tablets by mouth daily. 4pm    Dispense:  45 tablet    Refill:  0  Fu 42mo  I have completed the patient encounter in its entirety as documented by the scribe, with editing by me  where necessary. Toni Demo P. Merla Riches, M.D.  By signing my name below, I, Nadim Abuhashem, attest that this documentation has been prepared under the direction and in the presence of Frank Sia, MD.  Electronically Signed: Conchita Paris, medical scribe. 01/03/2015, 1:43 PM.

## 2015-01-11 ENCOUNTER — Encounter: Payer: Self-pay | Admitting: Internal Medicine

## 2015-01-13 ENCOUNTER — Encounter: Payer: Self-pay | Admitting: Internal Medicine

## 2015-01-13 DIAGNOSIS — N200 Calculus of kidney: Secondary | ICD-10-CM

## 2015-01-13 DIAGNOSIS — H9313 Tinnitus, bilateral: Secondary | ICD-10-CM

## 2015-01-27 ENCOUNTER — Telehealth: Payer: Self-pay

## 2015-01-27 NOTE — Telephone Encounter (Signed)
In the Imaging tab he had a CT back in 2005.

## 2015-01-27 NOTE — Telephone Encounter (Signed)
[facility] called to ask for records of his kidney stone.  I told her that I sent all the notes from the last visit, and there were no labs or imaging ordered by our office.  They cannot schedule the patient until they receive confirmation of his kidney stones.  Did the patient receive confirmation elsewhere?  If so, he will need to request his records to be [facility].  Please advise, thank you.  CB# for [facility] #: [phone]

## 2015-01-27 NOTE — Telephone Encounter (Signed)
A scan from 2005 would not be appropriate for a referral in 2016.  There is no confirmation that the kidney stone still exists, and no urinalysis that would indicate decreased flow, ureteral injury (pain, hematuria), etc.  No recent office visits for this patient indicate a kidney stone or a problem that would warrant a possible stone.  [facility] needs recent confirmation of the stone or urinary problems.  Thanks.

## 2015-01-28 NOTE — Telephone Encounter (Signed)
  Bilateral nephrolithiasis, 4 mm calculus in the lower pole left renal collecting system. , 3 mm calculus in the mid right renal collecting system. No hydronephrosis or ureterectasis. Remainder of visualized paraspinal soft tissues unremarkable  CT thoracic spine done 12/2014---in results

## 2015-01-29 NOTE — Telephone Encounter (Signed)
I did not see this sorry

## 2015-02-28 ENCOUNTER — Encounter: Payer: Self-pay | Admitting: Internal Medicine

## 2015-04-22 ENCOUNTER — Telehealth: Payer: Self-pay

## 2015-04-22 NOTE — Telephone Encounter (Signed)
Dr. Merla Richesoolittle, Fleet Contrasachel [facility] wanted to speak to you about this patient. He is scheduled to have back surgery soon and she wanted to talk to you. I informed her that you would not be back into the clinic until the 17th. She can be reached at [phone].

## 2015-04-22 NOTE — Telephone Encounter (Addendum)
I discussed this case with PA Fleet ContrasRachel from [facility] who indicated they were being cautious about proceeding to surgery because it seems the pain level was more excessive than they could explain with the findings of his spine. They have had him evaluated by pain management and neurology and the neurologist wanted him to see a psychiatrist next before surgery  He was dismissed from pain management in [place] because of testing positive for marijuana  Patient Active Problem List   Diagnosis Date Noted  . Narcolepsy 07/26/2011    Priority: High///[facility] neurology reevaluation January 2016 where diagnosis was felt to be more of idiopathic hypersomnolence, an unclear situation though not really narcolepsy.   . Myofascial pain 10/12/2014  . Kyphosis of thoracic region 10/02/2014  . Degeneration of intervertebral disc of thoracic region 10/02/2014  . Excessive daytime sleepiness 03/04/2014  . Patient overweight 03/04/2014  . Snoring 03/04/2014  . Insomnia 01/30/2012  . ADD (attention deficit disorder)--- it is never been clear how significant his ADD-was in the face of his sleep disorder  08/23/2011  . Acne 08/23/2011  . History of OCD (obsessive compulsive disorder)-  08/23/2011  . BMI 38.0-38.9,adult 08/23/2011  ..-  Stable nephrolithiasis see urol 12/16 in media  Current outpatient prescriptions:  .  ALPRAZolam (XANAX) 0.5 MG tablet, TAKE 1 TABLET BY MOUTH TWICE DAILY AS NEEDED FOR ANXIETY, Disp: 60 tablet, Rfl: 5 .  amphetamine-dextroamphetamine (ADDERALL XR) 30 MG 24 hr capsule, Take 2 capsules (60 mg total) by mouth daily. For 30d after signed, Disp: 60 capsule, Rfl: 0 ine (ADDERALL) 30 MG tablet, Take 1.5 tablets by mouth daily. At 4pm. For 30d after signed, Disp: 45 tablet, Rfl: 0 .  amphetamine-dextroamphetamine (ADDERALL) 30 MG tablet, Take 1.5 tablets by mouth daily. 4pm, Disp: 45 tablet, Rfl: 0 .  meloxicam (MOBIC) 15 MG tablet, Take 1 tablet (15 mg total) by mouth daily., Disp: 30  tablet, Rfl: 4 .  zolpidem (AMBIEN) 10 MG tablet, TAKE 1 TABLET BY MOUTH AT BEDTIME AS NEEDED, Disp: 90 tablet, Rfl: 1   I called [name] to discuss this. He is in a state of panic about everything that's going on. He describes frequent anxiety spells and occasional panic attacks over the past several weeks as he is pushing this process. These have become more obvious as he is trying to wean off Adderall. He has had significant distractibility, procrastination, inability to complete tasks and so forth which make his anxiety worse. In the past it has never been clear how much trouble he had from ADD versus hypersomnolence and is been many years since he's been evaluated. His back pain has been evaluated by several different people(incl ortho [place] 8/16, Dr Jordan LikesPool  Here 11/16) over the last 2 years. After a full evaluation in [place] he was [facility] in [place] where he works, as Dr Elvina SidleHey is in an expert with Scheuermann's kyphosis. He is anxious about upcoming surgery especially as he is receiving less than full assurance that it will resolve his back pain. As I talk to him I'm concerned that the obsessive compulsive disorder he had in high school at times has returned and that he is actually obsessing about his pain and blowing it out of proportion as well as now obsessing about all the details of the complexity in which he's involved. I think a full psychiatric evaluation including retesting for ADD but focusing on the notion of obsessive-compulsive disorder is the thing to do next and I  told [name] this. I also conveyed this message to Fleet Contras at [facility] clinic. I advised [name] to definitely hold on any surgery until we could have a full evaluation.  He should go ahead and come this weekend for preoperative testing and a little more thorough evaluation from a primary care perspective.

## 2015-04-24 ENCOUNTER — Ambulatory Visit (INDEPENDENT_AMBULATORY_CARE_PROVIDER_SITE_OTHER): Payer: 59 | Admitting: Physician Assistant

## 2015-04-24 ENCOUNTER — Ambulatory Visit (INDEPENDENT_AMBULATORY_CARE_PROVIDER_SITE_OTHER): Payer: 59

## 2015-04-24 VITALS — BP 114/88 | HR 63 | Temp 98.4°F | Resp 16 | Ht 72.0 in | Wt 232.0 lb

## 2015-04-24 DIAGNOSIS — G47419 Narcolepsy without cataplexy: Secondary | ICD-10-CM | POA: Diagnosis not present

## 2015-04-24 DIAGNOSIS — G4700 Insomnia, unspecified: Secondary | ICD-10-CM | POA: Diagnosis not present

## 2015-04-24 DIAGNOSIS — Z8659 Personal history of other mental and behavioral disorders: Secondary | ICD-10-CM

## 2015-04-24 DIAGNOSIS — F909 Attention-deficit hyperactivity disorder, unspecified type: Secondary | ICD-10-CM | POA: Diagnosis not present

## 2015-04-24 DIAGNOSIS — Z01818 Encounter for other preprocedural examination: Secondary | ICD-10-CM | POA: Diagnosis not present

## 2015-04-24 DIAGNOSIS — F988 Other specified behavioral and emotional disorders with onset usually occurring in childhood and adolescence: Secondary | ICD-10-CM

## 2015-04-24 LAB — CBC WITH DIFFERENTIAL/PLATELET
BASOS ABS: 0.1 10*3/uL (ref 0.0–0.1)
Basophils Relative: 1 % (ref 0–1)
Eosinophils Absolute: 0.2 10*3/uL (ref 0.0–0.7)
Eosinophils Relative: 3 % (ref 0–5)
HEMATOCRIT: 45.7 % (ref 39.0–52.0)
Hemoglobin: 16.1 g/dL (ref 13.0–17.0)
LYMPHS ABS: 1.8 10*3/uL (ref 0.7–4.0)
LYMPHS PCT: 26 % (ref 12–46)
MCH: 31.6 pg (ref 26.0–34.0)
MCHC: 35.2 g/dL (ref 30.0–36.0)
MCV: 89.6 fL (ref 78.0–100.0)
MPV: 11.1 fL (ref 8.6–12.4)
Monocytes Absolute: 0.5 10*3/uL (ref 0.1–1.0)
Monocytes Relative: 8 % (ref 3–12)
NEUTROS PCT: 62 % (ref 43–77)
Neutro Abs: 4.2 10*3/uL (ref 1.7–7.7)
PLATELETS: 166 10*3/uL (ref 150–400)
RBC: 5.1 MIL/uL (ref 4.22–5.81)
RDW: 12.8 % (ref 11.5–15.5)
WBC: 6.8 10*3/uL (ref 4.0–10.5)

## 2015-04-24 LAB — TSH: TSH: 1.86 m[IU]/L (ref 0.40–4.50)

## 2015-04-24 LAB — COMPREHENSIVE METABOLIC PANEL
ALK PHOS: 53 U/L (ref 40–115)
ALT: 22 U/L (ref 9–46)
AST: 18 U/L (ref 10–40)
Albumin: 4.8 g/dL (ref 3.6–5.1)
BILIRUBIN TOTAL: 1.1 mg/dL (ref 0.2–1.2)
BUN: 18 mg/dL (ref 7–25)
CALCIUM: 9.6 mg/dL (ref 8.6–10.3)
CO2: 21 mmol/L (ref 20–31)
Chloride: 104 mmol/L (ref 98–110)
Creat: 0.92 mg/dL (ref 0.60–1.35)
GLUCOSE: 85 mg/dL (ref 65–99)
POTASSIUM: 4.2 mmol/L (ref 3.5–5.3)
Sodium: 139 mmol/L (ref 135–146)
TOTAL PROTEIN: 7.5 g/dL (ref 6.1–8.1)

## 2015-04-24 MED ORDER — AMPHETAMINE-DEXTROAMPHETAMINE 30 MG PO TABS: 45.0000 mg | ORAL_TABLET | Freq: Every day | ORAL | Status: DC

## 2015-04-24 MED ORDER — FLUOXETINE HCL 20 MG PO TABS: 20.0000 mg | ORAL_TABLET | Freq: Every day | ORAL | Status: DC

## 2015-04-24 MED ORDER — AMPHETAMINE-DEXTROAMPHET ER 30 MG PO CP24: 60.0000 mg | ORAL_CAPSULE | Freq: Every day | ORAL | Status: DC

## 2015-04-24 MED ORDER — ALPRAZOLAM 0.5 MG PO TABS: ORAL_TABLET | ORAL | Status: DC

## 2015-04-24 MED ORDER — ZOLPIDEM TARTRATE 10 MG PO TABS: ORAL_TABLET | ORAL | Status: DC

## 2015-04-24 NOTE — Progress Notes (Signed)
Patient ID: Frank [name], male    DOB: 11/30/1990, 25 y.o.   MRN: [id number]  PCP: Tonye PearsonOLITTLE, ROBERT P, MD  Subjective:   Chief Complaint  Patient presents with  . Pre-op Exam  . Medication Refill    HPI Presents today for a preop exam due to Schuermann disease, medication refills, and to discuss anxiety coping techniques/treatment.   Is being treated for Schuermann's disease which is causing significant pain in his back. States he has kyphosis and scoliosis, 6 dehydrated discs, 1 bulging disc, shmorols nodes, and left pars fracture in midthoracic spine.  Is seeing Dr. Elvina SidleHey, a neurosurgeon, and Dr. Merla Richesoolittle, PCP. Was seeing a pain doctor but has tried all of the options before and had they had no additional suggestions so stopped seeing her. Is currently taking hydrocodone BID and tizanidine 4mg  TID. Has tried many other medications for pain relief including muscle relaxants, NSAIDs, and neuropathy medications with no relief. Is in process of getting an appointment with psychiatry.  Due to pain above expected levels, and so resistant to treatment, Dr. Layla MawHey's office contacted Dr. Merla Richesoolittle to discuss possible causes. Given his history of anxiety and OCD, Dr. Merla Richesoolittle advised he come in to see me today in his absence. Along with his parents (his mother is a Warden/rangerpsychologist), Dr. Merla Richesoolittle is working to get him in with a psychiatrist in [place], where he lives, for updated evaluation and treatment.  Has suffered from anxiety for many years but has recently increased in the last 3 months since the diagnosis of Schuermann and starting a new job. Tried prozac many years ago, before college. Doesn't remember it very well but does remember not liking the way it made him feel, "making me not feel like me." Wasn't sure if it was due to mixing prozac with adderall, and is not opposed to retrying it.   Feels like he's having a panic attack. Thinks it's all centered around the pain. Has pain, starts  thinking about the pain, the future and all he'll have to deal with, financial concerns. This makes him anxious, frustrated, and angry which makes the pain worse.  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 Feels like if he stops moving forward he'll stop moving, so he can't stop.   Also feels like has some depression mixed with anxiety. States many hobbies have "fallen by the wayside" some due to pain. Feels like he's "not a joy to be around" at times. Doesn't like going out as much because he feels like he needs "to put on a happy face."  Has history of OCD. Was mostly when really young when he "had to have everything perfect." Feels aderall has kept it mostly controlled. Has recently started to feel like it might be coming back because he often obsesses about his back pain. Thinks that feeling out of control with his health and future are also contributing. Denies obsessive or compulsive actions.   Denies suicidal/homicidal ideation.   Needs aderall refill for ADD and possible narcolepsy. Has been taking for +15 years. No complaints about current dosage or side effects.   Needs zolpidem refill for insomnia. Has a hard time getting to sleep. Was only needing it 1/wk but for the last 3 months has needed it at least 3 times a week. Complains of significant pain when trying to sleep. Averages 3hrs of sleep a night due to the pain. Often has a hard time getting to sleep. Every night has  a hard time staying asleep, waking multiple times due to the pain. He then spends awhile making adjustments, stretching, etc to feel better.   Needs alprazolam refill. Is taking for extreme anxiety. For the last three weeks is using 1-2/day, before was using only 1/week or so.     Review of Systems Respiratory: Negative for chest tightness and shortness of breath.  Cardiovascular: Positive for palpitations. Negative for chest pain.  Gastrointestinal: Negative for nausea,  vomiting, abdominal pain and diarrhea.  Musculoskeletal: Positive for back pain, neck pain and neck stiffness.  Neurological: Negative for dizziness, light-headedness and headaches.  Psychiatric/Behavioral: Positive for sleep disturbance, dysphoric mood and agitation. The patient is nervous/anxious.     Patient Active Problem List   Diagnosis Date Noted  . Myofascial pain 10/12/2014  . Kyphosis of thoracic region 10/02/2014  . Degeneration of intervertebral disc of thoracic region 10/02/2014  . Excessive daytime sleepiness 03/04/2014  . Patient overweight 03/04/2014  . Snoring 03/04/2014  . Insomnia 01/30/2012  . ADD (attention deficit disorder) 08/23/2011  . Acne 08/23/2011  . History of OCD (obsessive compulsive disorder) 08/23/2011  . BMI 38.0-38.9,adult 08/23/2011  . Narcolepsy 07/26/2011     Prior to Admission medications   Medication Sig Start Date End Date Taking? Authorizing Provider  ALPRAZolam Prudy Feeler) 0.5 MG tablet TAKE 1 TABLET BY MOUTH TWICE DAILY AS NEEDED FOR ANXIETY 01/03/15  Yes Tonye Pearson, MD  amphetamine-dextroamphetamine (ADDERALL XR) 30 MG 24 hr capsule Take 2 capsules (60 mg total) by mouth daily. For 30d after signed 01/03/15  Yes Tonye Pearson, MD  amphetamine-dextroamphetamine (ADDERALL XR) 30 MG 24 hr capsule Take 2 capsules (60 mg total) by mouth daily. 01/03/15  Yes Tonye Pearson, MD  amphetamine-dextroamphetamine (ADDERALL XR) 30 MG 24 hr capsule Take 2 capsules (60 mg total) by mouth every morning. For 60 days after signed 01/03/15  Yes Tonye Pearson, MD  amphetamine-dextroamphetamine (ADDERALL) 30 MG tablet Take 1.5 tablets by mouth daily. At 4pm. For 60 days after signed 01/03/15  Yes Tonye Pearson, MD  amphetamine-dextroamphetamine (ADDERALL) 30 MG tablet Take 1.5 tablets by mouth daily. At 4pm. For 30d after signed 01/03/15  Yes Tonye Pearson, MD  amphetamine-dextroamphetamine (ADDERALL) 30 MG tablet Take 1.5 tablets  by mouth daily. 4pm 01/03/15  Yes Tonye Pearson, MD  HYDROcodone-acetaminophen [facility]) 10-325 MG tablet Take 1 tablet by mouth 2 (two) times daily.   Yes Historical Provider, MD  tiZANidine (ZANAFLEX) 4 MG capsule Take 4 mg by mouth 2 (two) times daily.   Yes Historical Provider, MD  zolpidem (AMBIEN) 10 MG tablet TAKE 1 TABLET BY MOUTH AT BEDTIME AS NEEDED 01/03/15  Yes Tonye Pearson, MD  meloxicam (MOBIC) 15 MG tablet Take 15 mg by mouth. Reported on 04/24/2015 06/15/14   Historical Provider, MD     No Known Allergies     Objective:  Physical Exam  Constitutional: He is oriented to person, place, and time. He appears well-developed and well-nourished. He is active and cooperative. No distress.  BP 114/88 mmHg  Pulse 63  Temp(Src) 98.4 F (36.9 C) (Oral)  Resp 16  Ht 6' (1.829 m)  Wt 232 lb (105.235 kg)  BMI 31.46 kg/m2  SpO2 99%  HENT:  Head: Normocephalic and atraumatic.  Right Ear: Hearing normal.  Left Ear: Hearing normal.  Eyes: Conjunctivae are normal. No scleral icterus.  Neck: Normal range of motion. Neck supple. No thyromegaly present.  Cardiovascular: Normal rate, regular rhythm, normal heart  sounds and intact distal pulses.   Pulses:      Radial pulses are 2+ on the right side, and 2+ on the left side.  Pulmonary/Chest: Effort normal and breath sounds normal.  Lymphadenopathy:       Head (right side): No tonsillar, no preauricular, no posterior auricular and no occipital adenopathy present.       Head (left side): No tonsillar, no preauricular, no posterior auricular and no occipital adenopathy present.    He has no cervical adenopathy.       Right: No supraclavicular adenopathy present.       Left: No supraclavicular adenopathy present.  Neurological: He is alert and oriented to person, place, and time. No sensory deficit.  Skin: Skin is warm, dry and intact. No rash noted. No cyanosis or erythema. Nails show no clubbing.  Psychiatric: He has a normal mood  and affect. His speech is normal and behavior is normal.       EKG reviewed with Dr. Cleta Alberts. NSR. No ischemia or arrhythmias.  Dg Chest 2 View  04/24/2015  CLINICAL DATA:  25 year old male with a history of preoperative examination. EXAM: CHEST - 2 VIEW COMPARISON:  Recent plain film of the spine 12/29/2014 FINDINGS: Cardiomediastinal silhouette projects within normal limits in size and contour. No confluent airspace disease, pneumothorax, or pleural effusion. Focal kyphotic deformity at the lower thoracic spine, compatible with given history No displaced fracture. Unremarkable appearance of the upper abdomen. IMPRESSION: No radiographic evidence of acute cardiopulmonary disease. Signed, Yvone Neu. Loreta Ave, DO [facility] Electronically Signed   By: Gilmer Mor D.O.   On: 04/24/2015 12:05     Assessment & Plan:   1. Preoperative examination Normal CXR. Normal EKG. Await lab results. - CBC with Differential/Platelet - Comprehensive metabolic panel - DG Chest 2 View; Future - EKG 12-Lead - TSH - Prescript Monitor Profile (9)  2. Narcolepsy Stable. Continue current treatment. - zolpidem (AMBIEN) 10 MG tablet; TAKE 1 TABLET BY MOUTH AT BEDTIME AS NEEDED  Dispense: 30 tablet; Refill: 0  3. ADD (attention deficit disorder) Continue current treatment for now. Follow-up with Dr. Merla Riches, or psychiatrist if appointment can be scheduled in the meantime. - amphetamine-dextroamphetamine (ADDERALL) 30 MG tablet; Take 1.5 tablets by mouth daily. 4pm  Dispense: 45 tablet; Refill: 0 - amphetamine-dextroamphetamine (ADDERALL XR) 30 MG 24 hr capsule; Take 2 capsules (60 mg total) by mouth daily.  Dispense: 60 capsule; Refill: 0  4. History of OCD (obsessive compulsive disorder) Restart fluoxetine. Monitor for adverse effects. Follow-up with Dr. Doolittle/psychiatry. - ALPRAZolam (XANAX) 0.5 MG tablet; TAKE 1 TABLET BY MOUTH TWICE DAILY AS  NEEDED FOR ANXIETY  Dispense: 60 tablet; Refill: 0 - FLUoxetine (PROZAC) 20 MG tablet; Take 1 tablet (20 mg total) by mouth daily.  Dispense: 30 tablet; Refill: 3  5. Insomnia Stable. Continue current treatment. - zolpidem (AMBIEN) 10 MG tablet; TAKE 1 TABLET BY MOUTH AT BEDTIME AS NEEDED  Dispense: 30 tablet; Refill: 0   Fernande Bras, PA-C Physician Assistant-Certified [facility]

## 2015-04-24 NOTE — Patient Instructions (Addendum)
IF you received an x-ray today, you will receive an invoice from [facility]. Please contact [facility] at [phone] with questions or concerns regarding your invoice.   IF you received labwork today, you will receive an invoice from [facility]. Please contact [facility] at [phone] with questions or concerns regarding your invoice.   Our billing staff will not be able to assist you with questions regarding bills from these companies.  You will be contacted with the lab results as soon as they are available. The fastest way to get your results is to activate your My Chart account. Instructions are located on the last page of this paperwork. If you have not heard from us regarding the results in 2 weeks, please contact this office.  To start, take 1/2 tablet of the Prozac for 4-6 days, then increase to the whole tablet. When you see the psychiatrist, that provider will determine if the dose needs adjusting or if you should be on a different product.

## 2015-04-24 NOTE — Progress Notes (Signed)
Subjective:    Patient ID: Frank [name], male    DOB: Nov 16, 1990, 25 y.o.   MRN: [id number] Chief Complaint  Patient presents with  . Pre-op Exam  . Medication Refill   HPI   Presents today for a preop exam due to Schuermann disease, medication refills, and to discuss anxiety coping techniques/treatment.   Is being treated for Schuermann's disease which is causing significant pain in his back. States he has kyphosis and scoliosis, 6 dehydrated discs, 1 bulging disc, shmorols nodes, and left pars fracture in midthoracic spine.  Is seeing Dr. Cherene Altes a neurosurgeon and Dr. Merla Riches, PCP. Was seeing a pain doctor but has tried all of the options before and had they had no additional suggestions so stopped seeing her. Is currently taking hydrocodone BID and tizanidine  TID. Has tried many other medications for pain relief including muscle relaxants, NSAIDs, and neuropathy medications with no relief. Is in process of getting an appointment with psychiatry.  Has suffered from anxiety for many years but has recently increased in the last 3 months since the diagnosis of Schuermann and starting a new job. Tried prozac many years ago, before college. Doesn't remember it very well but does remember not liking the way it made him feel, "making me not feel like me." Wasn't sure if it was due to mixing prozac with aderall.   Feels like he's having a panic attack. Thinks it's all centered around the pain. Has pain, starts thinking about the pain, the future and all he'll have to deal with, financial concerns. This makes him anxious, frustrated, and angry which makes the pain worse.  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 Feels like if he stops moving forward he'll stop moving, so he can't stop.   Also feels like has some depression mixed with anxiety. States many hobbies have "fallen by the wayside" some due to pain. Feels like he's "not a joy to be around"  at times. Doesn't like going out as much because he feels like he needs "to put on a happy face."  Has history of OCD. Was mostly when really young when he "had to have everything perfect." Feels aderall has kept it mostly controlled. Has recently started to feel like it might be coming back because he often obsesses about his back pain. Thinks that feeling out of control with his health and future are also contributing.  Denies obsessive or compulsive actions.   Denies suicidal/homicidal ideation.   Needs aderall refill for ADD and possible narcolepsy. Has been taking for +15 years. No complaints about current dosage or side effects.   Needs zolpidem refill for insomnia. Has a hard time getting to sleep. Was only needing it 1/wk but for the last 3 months has needed it at least 3 times a week. Complains of significant pain when trying to sleep. Averages 3hrs of sleep a night due to the pain. Often has a hard time getting to sleep. Every night has a hard time staying asleep, waking multiple times due to the pain. He then spends awhile making adjustments, stretching, etc to feel better.   Needs alprazolam refill. Is taking for extreme anxiety. For the last three weeks is using 1-2/day, before was using only 1/week or so.   PMH, FH, and SH were all reviewed with patient and updated as needed.  No Known Allergies  Prior to Admission medications   Medication Sig Start Date End Date Taking? Authorizing  Provider  HYDROcodone-acetaminophen (NORCO) 10-325 MG tablet Take 1 tablet by mouth 2 (two) times daily.   Yes Historical Provider, MD  tiZANidine (ZANAFLEX) 4 MG capsule Take 4 mg by mouth 2 (two) times daily.   Yes Historical Provider, MD  ALPRAZolam Prudy Feeler) 0.5 MG tablet TAKE 1 TABLET BY MOUTH TWICE DAILY AS NEEDED FOR ANXIETY 01/03/15   Tonye Pearson, MD  amphetamine-dextroamphetamine (ADDERALL XR) 30 MG 24 hr capsule Take 2 capsules (60 mg total) by mouth daily. For 30d after signed 01/03/15    Tonye Pearson, MD  amphetamine-dextroamphetamine (ADDERALL XR) 30 MG 24 hr capsule Take 2 capsules (60 mg total) by mouth daily. 01/03/15   Tonye Pearson, MD  amphetamine-dextroamphetamine (ADDERALL XR) 30 MG 24 hr capsule Take 2 capsules (60 mg total) by mouth every morning. For 60 days after signed 01/03/15   Tonye Pearson, MD  amphetamine-dextroamphetamine (ADDERALL) 30 MG tablet Take 1.5 tablets by mouth daily. At 4pm. For 60 days after signed 01/03/15   Tonye Pearson, MD  amphetamine-dextroamphetamine (ADDERALL) 30 MG tablet Take 1.5 tablets by mouth daily. At 4pm. For 30d after signed 01/03/15   Tonye Pearson, MD  amphetamine-dextroamphetamine (ADDERALL) 30 MG tablet Take 1.5 tablets by mouth daily. 4pm 01/03/15   Tonye Pearson, MD  zolpidem (AMBIEN) 10 MG tablet TAKE 1 TABLET BY MOUTH AT BEDTIME AS NEEDED 01/03/15   Tonye Pearson, MD     Review of Systems  Respiratory: Negative for chest tightness and shortness of breath.   Cardiovascular: Positive for palpitations. Negative for chest pain.  Gastrointestinal: Negative for nausea, vomiting, abdominal pain and diarrhea.  Musculoskeletal: Positive for back pain, neck pain and neck stiffness.  Neurological: Negative for dizziness, light-headedness and headaches.  Psychiatric/Behavioral: Positive for sleep disturbance, dysphoric mood and agitation. The patient is nervous/anxious.        Objective:   Physical Exam  Constitutional: He is oriented to person, place, and time. He appears well-developed and well-nourished. No distress.  Blood pressure 114/88, pulse 63, temperature 98.4 F (36.9 C), temperature source Oral, resp. rate 16, height 6' (1.829 m), weight 232 lb (105.235 kg), SpO2 99 %.   HENT:  Head: Normocephalic and atraumatic.  Right Ear: External ear normal.  Left Ear: External ear normal.  Nose: Nose normal.  Mouth/Throat: Oropharynx is clear and moist. No oropharyngeal exudate.  Eyes:  Conjunctivae are normal. Pupils are equal, round, and reactive to light. Left eye exhibits no discharge. No scleral icterus.  Neck: Normal range of motion. Neck supple.  Cardiovascular: Normal rate, regular rhythm and intact distal pulses.  Exam reveals no gallop and no friction rub.   No murmur heard. Pulmonary/Chest: Effort normal and breath sounds normal. He has no wheezes. He has no rales. He exhibits no tenderness.  Lymphadenopathy:    He has no cervical adenopathy.  Neurological: He is alert and oriented to person, place, and time.  Skin: Skin is warm and dry.      Assessment & Plan:  1. Preoperative examination - tiZANidine (ZANAFLEX) 4 MG capsule; Take 4 mg by mouth 2 (two) times daily. - HYDROcodone-acetaminophen (NORCO) 10-325 MG tablet; Take 1 tablet by mouth 2 (two) times daily. - CBC with Differential/Platelet - Comprehensive metabolic panel - DG Chest 2 View; Future - EKG 12-Lead - TSH - Prescript Monitor Profile (9)  2. Narcolepsy - zolpidem (AMBIEN) 10 MG tablet; TAKE 1 TABLET BY MOUTH AT BEDTIME AS NEEDED  Dispense: 30 tablet; Refill: 0  3. ADD (attention deficit disorder) - amphetamine-dextroamphetamine (ADDERALL) 30 MG tablet; Take 1.5 tablets by mouth daily. 4pm  Dispense: 45 tablet; Refill: 0 - amphetamine-dextroamphetamine (ADDERALL XR) 30 MG 24 hr capsule; Take 2 capsules (60 mg total) by mouth daily.  Dispense: 60 capsule; Refill: 0  4. History of OCD (obsessive compulsive disorder) - ALPRAZolam (XANAX) 0.5 MG tablet; TAKE 1 TABLET BY MOUTH TWICE DAILY AS NEEDED FOR ANXIETY  Dispense: 60 tablet; Refill: 0 - FLUoxetine (PROZAC) 20 MG tablet; Take 1 tablet (20 mg total) by mouth daily.  Dispense: 30 tablet; Refill: 3  5. Insomnia - zolpidem (AMBIEN) 10 MG tablet; TAKE 1 TABLET BY MOUTH AT BEDTIME AS NEEDED  Dispense: 30 tablet; Refill: 0  According to the neuro PA his pain is out of proportion to his condition. Concern his anxiety and OCD are making the pain  seem/be worse. Is planning to see a psychiatrist for further evaluation. Will start on prozac today at low dose. This will give him time to determine if the medication is effective or if there are undesirable side effects before going to the psychiatrist.  Will conduct all pre-op screening tests today in case of future need. Refilled prescriptions for about 1 month until his appointment.   Discussed assessment and plan with patient. They expressed their understanding and acceptance.   Hilton CorkS. Dani Epic Tribbett PA-S [facility] [employer]

## 2015-04-27 ENCOUNTER — Telehealth: Payer: Self-pay | Admitting: *Deleted

## 2015-04-27 ENCOUNTER — Telehealth: Payer: Self-pay

## 2015-04-27 NOTE — Telephone Encounter (Signed)
French Anaracy from [facility] states pt came in the weekend for labs,drug screen and ov, they are in need of the results and records ASAP   PT IS IN OFFICE NOW                          PLEASE FAX TO [phone] AND THE PHONE NUMBER IS [phone] IF NEEDED

## 2015-04-27 NOTE — Telephone Encounter (Signed)
Faxed information via Release to [facility]. Done

## 2015-04-27 NOTE — Telephone Encounter (Signed)
Frank [name] from [facility] called for urine test results. Pt had an appt today at 2pm. [facility] and they are still pending, waiting on confirmation of benzos.  We will call [facility] and check results.  [facility] [phone].  Left couple of messages on her voicmail about we were still waiting.  Her number if we need to call her is [phone].

## 2015-04-28 ENCOUNTER — Encounter: Payer: Self-pay | Admitting: Internal Medicine

## 2015-04-30 LAB — CANNABANOIDS (GC/LC/MS), URINE: THC-COOH UR CONFIRM: 931 ng/mL — AB (ref <5)

## 2015-04-30 LAB — OPIATES/OPIOIDS (LC/MS-MS)
CODEINE URINE: NEGATIVE ng/mL (ref <50)
HYDROCODONE: 1636 ng/mL — AB (ref <50)
Hydromorphone: 671 ng/mL — AB (ref <50)
Morphine Urine: NEGATIVE ng/mL (ref <50)
Norhydrocodone, Ur: 2320 ng/mL — AB (ref <50)
Noroxycodone, Ur: NEGATIVE ng/mL (ref <50)
Oxycodone, ur: NEGATIVE ng/mL (ref <50)
Oxymorphone: NEGATIVE ng/mL (ref <50)

## 2015-04-30 LAB — BENZODIAZEPINES (GC/LC/MS), URINE
Alprazolam metabolite (GC/LC/MS), ur confirm: 71 ng/mL — AB (ref <25)
Clonazepam metabolite (GC/LC/MS), ur confirm: NEGATIVE ng/mL (ref <25)
Flurazepam metabolite (GC/LC/MS), ur confirm: NEGATIVE ng/mL (ref <50)
Lorazepam (GC/LC/MS), ur confirm: NEGATIVE ng/mL (ref <50)
Midazolam (GC/LC/MS), ur confirm: NEGATIVE ng/mL (ref <50)
NORDIAZEPAMU: NEGATIVE ng/mL (ref <50)
OXAZEPAMU: NEGATIVE ng/mL (ref <50)
TEMAZEPAMU: NEGATIVE ng/mL (ref <50)
TRIAZOLAMU: NEGATIVE ng/mL (ref <50)

## 2015-04-30 NOTE — Telephone Encounter (Signed)
Frank [name] called in checking on status of this message. Please fax to number below with the  tox screen results and give her a CB at [phone] or the one from below.

## 2015-04-30 NOTE — Telephone Encounter (Signed)
[employer] and they stated [facility]  Results should be hopefully in on Monday.  I will call [facility] then.  Left detailed message on Tracy vm.

## 2015-05-01 LAB — PRESCRIPTION MONITORING PROFILE (9 PANEL)
Amphetamine/Meth: NEGATIVE ng/mL
BARBITURATE SCREEN, URINE: NEGATIVE ng/mL
COCAINE METABOLITES: NEGATIVE ng/mL
CREATININE, URINE: 160.37 mg/dL (ref >20.0)
METHADONE SCREEN, URINE: NEGATIVE ng/mL
Nitrites, Initial: NEGATIVE ug/mL
OXYCODONE SCRN UR: NEGATIVE ng/mL
Propoxyphene: NEGATIVE ng/mL
pH, Initial: 5.7 pH (ref 4.5–8.9)

## 2015-05-05 NOTE — Telephone Encounter (Signed)
Labs faxed

## 2015-05-27 ENCOUNTER — Encounter: Payer: Self-pay | Admitting: Internal Medicine

## 2015-05-28 MED ORDER — HYDROCODONE-ACETAMINOPHEN 10-325 MG PO TABS: ORAL_TABLET | ORAL | Status: DC

## 2015-05-28 NOTE — Telephone Encounter (Signed)
[facility](last refill 3/1 Long neuro/Pain consult) Needs meds to last til surgery  Meds ordered this encounter  Medications  . HYDROcodone-acetaminophen (NORCO) 10-325 MG tablet    Sig: 1/2 -1 tab 2-3 times a day for pain control prior to surgery 06/20/15    Dispense:  70 tablet    Refill:  0

## 2015-06-08 ENCOUNTER — Other Ambulatory Visit: Payer: Self-pay | Admitting: Physician Assistant

## 2015-06-10 NOTE — Telephone Encounter (Signed)
Called in.

## 2015-07-02 ENCOUNTER — Other Ambulatory Visit: Payer: Self-pay | Admitting: Physician Assistant

## 2015-07-02 ENCOUNTER — Telehealth: Payer: Self-pay | Admitting: Internal Medicine

## 2015-07-02 DIAGNOSIS — F988 Other specified behavioral and emotional disorders with onset usually occurring in childhood and adolescence: Secondary | ICD-10-CM

## 2015-07-02 DIAGNOSIS — G4700 Insomnia, unspecified: Secondary | ICD-10-CM

## 2015-07-02 DIAGNOSIS — G47419 Narcolepsy without cataplexy: Secondary | ICD-10-CM

## 2015-07-08 MED ORDER — HYDROCODONE-ACETAMINOPHEN 10-325 MG PO TABS: ORAL_TABLET | ORAL | Status: DC

## 2015-07-08 MED ORDER — AMPHETAMINE-DEXTROAMPHETAMINE 30 MG PO TABS: 45.0000 mg | ORAL_TABLET | Freq: Every day | ORAL | Status: DC

## 2015-07-08 MED ORDER — ALPRAZOLAM 0.5 MG PO TABS: 0.5000 mg | ORAL_TABLET | Freq: Two times a day (BID) | ORAL | Status: DC | PRN

## 2015-07-08 MED ORDER — AMPHETAMINE-DEXTROAMPHET ER 30 MG PO CP24: 60.0000 mg | ORAL_CAPSULE | Freq: Every day | ORAL | Status: DC

## 2015-07-08 MED ORDER — ZOLPIDEM TARTRATE 10 MG PO TABS: ORAL_TABLET | ORAL | Status: DC

## 2015-07-08 MED ORDER — AMPHETAMINE-DEXTROAMPHET ER 30 MG PO CP24: 60.0000 mg | ORAL_CAPSULE | ORAL | Status: DC

## 2015-07-08 NOTE — Addendum Note (Signed)
Addended by: Chaney Maclaren S on: 07/08/2015 11:06 PM   Modules accepted: Orders  

## 2015-07-08 NOTE — Telephone Encounter (Signed)
Patient notified via My Chart.  Meds ordered this encounter  Medications  . HYDROcodone-acetaminophen (NORCO) 10-325 MG tablet    Sig: 1/2 -1 tab 2-3 times a day for pain control prior to surgery 06/20/15    Dispense:  70 tablet    Refill:  0    Order Specific Question:  Supervising Provider    Answer:  DOOLITTLE, ROBERT P [[id number]]  . ALPRAZolam (XANAX) 0.5 MG tablet    Sig: Take 1 tablet (0.5 mg total) by mouth 2 (two) times daily as needed. for anxiety    Dispense:  60 tablet    Refill:  0    Order Specific Question:  Supervising Provider    Answer:  DOOLITTLE, ROBERT P [[id number]]  . zolpidem (AMBIEN) 10 MG tablet    Sig: TAKE 1 TABLET BY MOUTH AT BEDTIME AS NEEDED    Dispense:  30 tablet    Refill:  0    Order Specific Question:  Supervising Provider    Answer:  DOOLITTLE, ROBERT P [[id number]]  . amphetamine-dextroamphetamine (ADDERALL XR) 30 MG 24 hr capsule    Sig: Take 2 capsules (60 mg total) by mouth daily.    Dispense:  60 capsule    Refill:  0    Order Specific Question:  Supervising Provider    Answer:  DOOLITTLE, ROBERT P [[id number]]  . amphetamine-dextroamphetamine (ADDERALL) 30 MG tablet    Sig: Take 1.5 tablets by mouth daily. 4pm    Dispense:  45 tablet    Refill:  0    Order Specific Question:  Supervising Provider    Answer:  DOOLITTLE, ROBERT P [[id number]]  . amphetamine-dextroamphetamine (ADDERALL XR) 30 MG 24 hr capsule    Sig: Take 2 capsules (60 mg total) by mouth daily. For 30d after signed    Dispense:  60 capsule    Refill:  0    Order Specific Question:  Supervising Provider    Answer:  DOOLITTLE, ROBERT P [[id number]]  . amphetamine-dextroamphetamine (ADDERALL) 30 MG tablet    Sig: Take 1.5 tablets by mouth daily. At 4pm. For 30d after signed    Dispense:  45 tablet    Refill:  0    Order Specific Question:  Supervising Provider    Answer:  DOOLITTLE, ROBERT P [[id number]]  . amphetamine-dextroamphetamine (ADDERALL XR) 30 MG 24 hr capsule    Sig: Take 2 capsules (60 mg total)  by mouth every morning. For 60 days after signed    Dispense:  60 capsule    Refill:  0    Order Specific Question:  Supervising Provider    Answer:  DOOLITTLE, ROBERT P [[id number]]  . amphetamine-dextroamphetamine (ADDERALL) 30 MG tablet    Sig: Take 1.5 tablets by mouth daily. At 4pm. For 60 days after signed    Dispense:  45 tablet    Refill:  0    Order Specific Question:  Supervising Provider    Answer:  DOOLITTLE, ROBERT P [[id number]]

## 2015-07-08 NOTE — Addendum Note (Signed)
Addended by: Fernande BrasJEFFERY, Tania Perrott S on: 07/08/2015 11:06 PM   Modules accepted: Orders

## 2015-08-24 ENCOUNTER — Telehealth: Payer: Self-pay | Admitting: Physician Assistant

## 2015-08-24 DIAGNOSIS — G4700 Insomnia, unspecified: Secondary | ICD-10-CM

## 2015-08-24 DIAGNOSIS — G47419 Narcolepsy without cataplexy: Secondary | ICD-10-CM

## 2015-08-24 DIAGNOSIS — F988 Other specified behavioral and emotional disorders with onset usually occurring in childhood and adolescence: Secondary | ICD-10-CM

## 2015-08-25 MED ORDER — AMPHETAMINE-DEXTROAMPHET ER 30 MG PO CP24: 60.0000 mg | ORAL_CAPSULE | Freq: Every day | ORAL | Status: DC

## 2015-08-25 MED ORDER — ZOLPIDEM TARTRATE 10 MG PO TABS: ORAL_TABLET | ORAL | Status: DC

## 2015-08-25 MED ORDER — HYDROCODONE-ACETAMINOPHEN 10-325 MG PO TABS: ORAL_TABLET | ORAL | Status: DC

## 2015-08-25 MED ORDER — AMPHETAMINE-DEXTROAMPHETAMINE 30 MG PO TABS: 45.0000 mg | ORAL_TABLET | Freq: Every day | ORAL | Status: DC

## 2015-08-25 MED ORDER — ALPRAZOLAM 0.5 MG PO TABS: 0.5000 mg | ORAL_TABLET | Freq: Two times a day (BID) | ORAL | Status: DC | PRN

## 2015-08-25 NOTE — Telephone Encounter (Signed)
Rx in drawer. 

## 2015-08-25 NOTE — Telephone Encounter (Signed)
Printed from home.  Will sign when I come in at 1pm.  Patient notified via My Chart.  Meds ordered this encounter  Medications  . HYDROcodone-acetaminophen (NORCO) 10-325 MG tablet    Sig: 1/2 -1 tab 2-3 times a day for pain control prior to surgery 06/20/15    Dispense:  70 tablet    Refill:  0    Order Specific Question:  Supervising Provider    Answer:  Clelia CroftSHAW, EVA N [[id number]]  . ALPRAZolam (XANAX) 0.5 MG tablet    Sig: Take 1 tablet (0.5 mg total) by mouth 2 (two) times daily as needed. for anxiety    Dispense:  60 tablet    Refill:  0    Order Specific Question:  Supervising Provider    Answer:  Clelia CroftSHAW, EVA N [[id number]]  . zolpidem (AMBIEN) 10 MG tablet    Sig: TAKE 1 TABLET BY MOUTH AT BEDTIME AS NEEDED    Dispense:  30 tablet    Refill:  0    Order Specific Question:  Supervising Provider    Answer:  SHAW, EVA N [[id number]]  . amphetamine-dextroamphetamine (ADDERALL XR) 30 MG 24 hr capsule    Sig: Take 2 capsules (60 mg total) by mouth daily.    Dispense:  60 capsule    Refill:  0    Order Specific Question:  Supervising Provider    Answer:  SHAW, EVA N [[id number]]  . amphetamine-dextroamphetamine (ADDERALL) 30 MG tablet    Sig: Take 1.5 tablets by mouth daily. 4pm    Dispense:  45 tablet    Refill:  0    Order Specific Question:  Supervising Provider    Answer:  Clelia CroftSHAW, EVA N [[id number]]

## 2015-10-06 ENCOUNTER — Other Ambulatory Visit: Payer: Self-pay | Admitting: Physician Assistant

## 2015-10-08 NOTE — Telephone Encounter (Signed)
Meds ordered this encounter  Medications  . ALPRAZolam (XANAX) 0.5 MG tablet    Sig: TAKE 1 TABLET(0.5 MG TOTAL) BY MOUTH TWICE DAILY AS NEEDED FOR ANXIETY    Dispense:  60 tablet    Refill:  0

## 2015-10-09 NOTE — Telephone Encounter (Signed)
rx for alprazolam faxed to [facility] in [place].

## 2015-10-18 ENCOUNTER — Other Ambulatory Visit: Payer: Self-pay | Admitting: Physician Assistant

## 2015-10-18 DIAGNOSIS — G47419 Narcolepsy without cataplexy: Secondary | ICD-10-CM

## 2015-10-18 DIAGNOSIS — F988 Other specified behavioral and emotional disorders with onset usually occurring in childhood and adolescence: Secondary | ICD-10-CM

## 2015-10-18 DIAGNOSIS — G4700 Insomnia, unspecified: Secondary | ICD-10-CM

## 2015-10-23 MED ORDER — ZOLPIDEM TARTRATE 10 MG PO TABS: ORAL_TABLET | ORAL | 0 refills | Status: DC

## 2015-10-23 MED ORDER — AMPHETAMINE-DEXTROAMPHET ER 30 MG PO CP24: 60.0000 mg | ORAL_CAPSULE | Freq: Every day | ORAL | 0 refills | Status: DC

## 2015-10-23 MED ORDER — HYDROCODONE-ACETAMINOPHEN 10-325 MG PO TABS: ORAL_TABLET | ORAL | 0 refills | Status: DC

## 2015-10-23 MED ORDER — AMPHETAMINE-DEXTROAMPHETAMINE 30 MG PO TABS: 45.0000 mg | ORAL_TABLET | Freq: Every day | ORAL | 0 refills | Status: DC

## 2015-10-23 NOTE — Telephone Encounter (Signed)
Meds ordered this encounter  Medications  . HYDROcodone-acetaminophen (NORCO) 10-325 MG tablet    Sig: 1/2 -1 tab 2-3 times a day for pain control prior to surgery 06/20/15    Dispense:  70 tablet    Refill:  0  . zolpidem (AMBIEN) 10 MG tablet    Sig: TAKE 1 TABLET BY MOUTH AT BEDTIME AS NEEDED    Dispense:  30 tablet    Refill:  0  . amphetamine-dextroamphetamine (ADDERALL XR) 30 MG 24 hr capsule    Sig: Take 2 capsules (60 mg total) by mouth daily.    Dispense:  60 capsule    Refill:  0  . amphetamine-dextroamphetamine (ADDERALL) 30 MG tablet    Sig: Take 1.5 tablets by mouth daily. 4pm    Dispense:  45 tablet    Refill:  0

## 2015-10-25 NOTE — Telephone Encounter (Signed)
Notified pt ready on MyChart.

## 2015-11-08 ENCOUNTER — Other Ambulatory Visit: Payer: Self-pay | Admitting: Physician Assistant

## 2015-11-08 DIAGNOSIS — G4700 Insomnia, unspecified: Secondary | ICD-10-CM

## 2015-11-08 DIAGNOSIS — G47419 Narcolepsy without cataplexy: Secondary | ICD-10-CM

## 2015-11-10 NOTE — Telephone Encounter (Signed)
Faxed Rxs and notified pt of need for f/up on MyChart message. I had tried to call pt but Vm was full.

## 2015-11-10 NOTE — Telephone Encounter (Signed)
[name], pt's last OV 3/11, last RF of alprazolam 8/27, and zolpidem 9/9. I will pend w/note to not fill zolpidem until 10/9.

## 2015-11-10 NOTE — Telephone Encounter (Signed)
Meds ordered this encounter  Medications  . ALPRAZolam (XANAX) 0.5 MG tablet    Sig: TAKE 1 TABLET(0.5 MG TOTAL) BY MOUTH TWICE DAILY AS NEEDED FOR ANXIETY    Dispense:  60 tablet    Refill:  0  . zolpidem (AMBIEN) 10 MG tablet    Sig: TAKE 1 TABLET BY MOUTH EVERY NIGHT AT BEDTIME AS NEEDED    Dispense:  30 tablet    Refill:  0    May fill on/after 11/22/15.    Please let him know that I need to see him again for the next fills.

## 2015-12-13 ENCOUNTER — Other Ambulatory Visit: Payer: Self-pay | Admitting: Physician Assistant

## 2015-12-13 DIAGNOSIS — G47419 Narcolepsy without cataplexy: Secondary | ICD-10-CM

## 2015-12-13 DIAGNOSIS — G4700 Insomnia, unspecified: Secondary | ICD-10-CM

## 2015-12-13 DIAGNOSIS — F988 Other specified behavioral and emotional disorders with onset usually occurring in childhood and adolescence: Secondary | ICD-10-CM

## 2015-12-14 NOTE — Telephone Encounter (Signed)
Rx printed at 104. Will bring to 102 after clinic.  Meds ordered this encounter  Medications  . ALPRAZolam (XANAX) 0.5 MG tablet    Sig: TAKE 1 TABLET BY MOUTH TWICE DAILY AS NEEDED FOR ANXIETY    Dispense:  60 tablet    Refill:  0    Please advise patient that he needs to schedule a follow-up visit with me.

## 2015-12-14 NOTE — Telephone Encounter (Signed)
Pt's last app was 3/11, last RF 9/27. No scheduled appt.

## 2015-12-15 NOTE — Telephone Encounter (Signed)
Alprazolam Rx faxed to pharm 10/31, computers were down and I couldn't document at the time. Pt has also req'd other meds through Mychart. Will forward to [name] and let pt know of need for f/up.

## 2015-12-15 NOTE — Telephone Encounter (Signed)
Already faxed RF of alprazolam which pt also included in this list. I advised pt of this and of need for f/up on MyChart. [name], please review the other requests. It looks like he would be due for RFs of all of these according to when we RFd last, except zolpidem which wouldn't be due until 11/8, because last RF had fill date of 10/9.

## 2015-12-15 NOTE — Telephone Encounter (Signed)
Please authorize the prescriptions on my behalf while I am out of the office. He has been advised that he needs a follow-up with me for additional prescriptions.

## 2015-12-16 MED ORDER — ZOLPIDEM TARTRATE 10 MG PO TABS: 10.0000 mg | ORAL_TABLET | Freq: Every evening | ORAL | 0 refills | Status: DC | PRN

## 2015-12-16 MED ORDER — HYDROCODONE-ACETAMINOPHEN 10-325 MG PO TABS: ORAL_TABLET | ORAL | 0 refills | Status: DC

## 2015-12-16 MED ORDER — AMPHETAMINE-DEXTROAMPHET ER 30 MG PO CP24: 60.0000 mg | ORAL_CAPSULE | Freq: Every day | ORAL | 0 refills | Status: DC

## 2015-12-16 MED ORDER — AMPHETAMINE-DEXTROAMPHETAMINE 30 MG PO TABS: 45.0000 mg | ORAL_TABLET | Freq: Every day | ORAL | 0 refills | Status: DC

## 2015-12-16 NOTE — Telephone Encounter (Signed)
Done

## 2015-12-16 NOTE — Telephone Encounter (Signed)
Up front for pick up 

## 2015-12-20 NOTE — Progress Notes (Signed)
[name] [address] [place], [place] [address] Phone: [phone] Subjective:    I'm seeing this patient by the request  of:  Porfirio Oar, PA-C   CC: Multiple joint pain  [id number]  Frank [name] is a 25 y.o. male coming in with complaint of multiple joint pain. Patient is had this pain for going on 2 years. Seems to be mostly on the left thoracic region. Started to give him more difficulty when he was increasing activity but then started giving him trouble with daily activities. Patient was to have back surgery. Was having difficulty with pain control and was on chronic narcotics. Patient has been doing formal physical therapy as well. Patient is seen a spine specialist at [facility]. They are discussing a spinal cord stimulator.  Patient states unfortunately continues to have chronic left-sided thoracic spine pain with sometimes radiation down the leg. Seems to be more of a shooting or stabbing sensation that can be constant. Rates the severity of 7 out of 10. Has affected multiple daily activities including sleep, work, as well as sexual activity. Pain medications is a limiting that keeps it at pain. Continues with the pain management center where his underwent facet injection and trigger point injections with mild relief. As we stated doing formal physical therapy as well as aquatic therapy. States with everything he is not getting better. If anything possible worsening.    previous imaging of the patient's back includes November 16 a CT scan of thoracic spine. Done have Scheuermann's disease. Seems to be mostly from T6-T12 and mild arthritic changes with kidney stone formation noted bilaterally.  Current Outpatient Prescriptions:  .  ALPRAZolam (XANAX) 0.5 MG tablet, TAKE 1 TABLET BY MOUTH TWICE DAILY AS NEEDED FOR ANXIETY, Disp: 60 tablet, Rfl: 0 .  amphetamine-dextroamphetamine (ADDERALL XR) 30 MG 24 hr capsule, Take 2 capsules (60 mg total) by  mouth daily. For 30d after signed, Disp: 60 capsule, Rfl: 0 .  amphetamine-dextroamphetamine (ADDERALL XR) 30 MG 24 hr capsule, Take 2 capsules (60 mg total) by mouth every morning. For 60 days after signed, Disp: 60 capsule, Rfl: 0 .  amphetamine-dextroamphetamine (ADDERALL XR) 30 MG 24 hr capsule, Take 2 capsules (60 mg total) by mouth daily., Disp: 60 capsule, Rfl: 0 .  amphetamine-dextroamphetamine (ADDERALL) 30 MG tablet, Take 1.5 tablets by mouth daily. At 4pm. For 30d after signed, Disp: 45 tablet, Rfl: 0 .  amphetamine-dextroamphetamine (ADDERALL) 30 MG tablet, Take 1.5 tablets by mouth daily. At 4pm. For 60 days after signed, Disp: 45 tablet, Rfl: 0 .  amphetamine-dextroamphetamine (ADDERALL) 30 MG tablet, Take 1.5 tablets by mouth daily. 4pm, Disp: 45 tablet, Rfl: 0 .  HYDROcodone-acetaminophen (NORCO) 10-325 MG tablet, 1/2 -1 tab 2-3 times a day for pain control prior to surgery 06/20/15, Disp: 70 tablet, Rfl: 0 .  zolpidem (AMBIEN) 10 MG tablet, Take 1 tablet (10 mg total) by mouth at bedtime as needed., Disp: 30 tablet, Rfl: 0 .  Diclofenac Sodium (PENNSAID) 2 % SOLN, Apply 1 pump twice daily as needed., Disp: 112 g, Rfl: 3 .  Vitamin D, Ergocalciferol, (DRISDOL) 50000 units CAPS capsule, Take 1 capsule (50,000 Units total) by mouth every 7 (seven) days., Disp: 12 capsule, Rfl: 0  Past Medical History:  Diagnosis Date  . ADD (attention deficit disorder)   . Anxiety    Past Surgical History:  Procedure Laterality Date  . KNEE SURGERY    . WISDOM TOOTH EXTRACTION  2013   Social  History   Social History  . Marital status: Single    Spouse name: N/A  . Number of children: N/A  . Years of education: N/A   Social History Main Topics  . Smoking status: Never Smoker  . Smokeless tobacco: Never Used  . Alcohol use 7.2 oz/week    7 Standard drinks or equivalent, 5 Shots of liquor per week  . Drug use: No  . Sexual activity: Yes   Other Topics Concern  . None   Social History  Narrative   Caffeine 1-2 cups a week   No Known Allergies Family History  Problem Relation Age of Onset  . Stroke Maternal Grandmother   . Cancer Paternal Grandmother     Breast  . Hypertension Paternal Grandmother   . Diabetes Paternal Grandfather   . Prostate cancer Father     and grandfather    Past medical history, social, surgical and family history all reviewed in electronic medical record.  No pertanent information unless stated regarding to the chief complaint.   Review of Systems:Review of systems updated and as accurate as of 12/21/15  No headache, visual changes, nausea, vomiting, diarrhea, constipation, dizziness, abdominal pain, skin rash, fevers, chills, night sweats, weight loss, swollen lymph nodes,  chest pain, shortness of breath, mood changes. Positive for joint pain and muscle aches.  Objective  Blood pressure 136/82, pulse 90, height 6' (1.829 m), weight 240 lb (108.9 kg), SpO2 97 %. Systems examined below as of 12/21/15   General: No apparent distress alert and oriented x3 mood and affect normal, dressed appropriately.  HEENT: Pupils equal, extraocular movements intact  Respiratory: Patient's speak in full sentences and does not appear short of breath  Cardiovascular: No lower extremity edema, non tender, no erythema  Skin: Warm dry intact with no signs of infection or rash on extremities or on axial skeleton.  Abdomen: Soft nontender  Neuro: Cranial nerves II through XII are intact, neurovascularly intact in all extremities with 2+ DTRs and 2+ pulses.  Lymph: No lymphadenopathy of posterior or anterior cervical chain or axillae bilaterally.  Gait normal with good balance and coordination.  MSK:  Non tender with full range of motion and good stability and symmetric strength and tone of shoulders, elbows, wrist, hip, knee and ankles bilaterally. Patient does have hypermobility of multiple joints. Beighton score 7    Back Exam:  Inspection: Unremarkable    Motion: Flexion 45 deg, Extension 45 deg, Side Bending to 45 deg bilaterally,  Rotation to 45 deg bilaterally excessive range of motion in all fields of plain. SLR laying: Negative  XSLR laying: Negative  Palpable tenderness: Tenderness mostly over the left paraspinal Musket suture at the thoracic or lumbar junction. FABER: negative. Sensory change: Gross sensation intact to all lumbar and sacral dermatomes.  Reflexes: 2+ at both patellar tendons, 2+ at achilles tendons, Babinski's downgoing.  Strength at foot  Plantar-flexion: 5/5 Dorsi-flexion: 5/5 Eversion: 5/5 Inversion: 5/5  Leg strength  Quad: 5/5 Hamstring: 5/5 Hip flexor: 5/5 Hip abductors: 3/5 but symmetric Gait unremarkable.  Osteopathic findings T3 extended rotated and side bent right T5 extended rotated and side bent left L2 flexed rotated and side bent left Sacrum right on right  Procedure note 97110; 15 minutes spent for Therapeutic exercises as stated in above notes.  This included exercises focusing on stretching, strengthening, with significant focus on eccentric aspects. Low back exercises that included:  Pelvic tilt/bracing instruction to focus on control of the pelvic girdle and lower abdominal muscles  Glute strengthening exercises, focusing on proper firing of the glutes without engaging the low back muscles Proper stretching techniques for maximum relief for the hamstrings, hip flexors, low back and some rotation where tolerated    Proper technique shown and discussed handout in great detail with ATC.  All questions were discussed and answered.     Impression and Recommendations:     This case required medical decision making of moderate complexity.      Note: This dictation was prepared with Dragon dictation along with smaller phrase technology. Any transcriptional errors that result from this process are unintentional.

## 2015-12-21 ENCOUNTER — Other Ambulatory Visit (INDEPENDENT_AMBULATORY_CARE_PROVIDER_SITE_OTHER): Payer: PRIVATE HEALTH INSURANCE

## 2015-12-21 ENCOUNTER — Encounter: Payer: Self-pay | Admitting: Family Medicine

## 2015-12-21 ENCOUNTER — Ambulatory Visit (INDEPENDENT_AMBULATORY_CARE_PROVIDER_SITE_OTHER): Payer: PRIVATE HEALTH INSURANCE | Admitting: Family Medicine

## 2015-12-21 VITALS — BP 136/82 | HR 90 | Ht 72.0 in | Wt 240.0 lb

## 2015-12-21 DIAGNOSIS — M999 Biomechanical lesion, unspecified: Secondary | ICD-10-CM | POA: Insufficient documentation

## 2015-12-21 DIAGNOSIS — M2550 Pain in unspecified joint: Secondary | ICD-10-CM | POA: Diagnosis not present

## 2015-12-21 DIAGNOSIS — M4200 Juvenile osteochondrosis of spine, site unspecified: Secondary | ICD-10-CM | POA: Diagnosis not present

## 2015-12-21 DIAGNOSIS — E663 Overweight: Secondary | ICD-10-CM

## 2015-12-21 LAB — SEDIMENTATION RATE: SED RATE: 3 mm/h (ref 0–15)

## 2015-12-21 LAB — C-REACTIVE PROTEIN: CRP: 0.1 mg/dL — ABNORMAL LOW (ref 0.5–20.0)

## 2015-12-21 LAB — VITAMIN D 25 HYDROXY (VIT D DEFICIENCY, FRACTURES): VITD: 27.69 ng/mL — ABNORMAL LOW (ref 30.00–100.00)

## 2015-12-21 MED ORDER — DICLOFENAC SODIUM 2 % TD SOLN: TRANSDERMAL | 3 refills | Status: DC

## 2015-12-21 MED ORDER — VITAMIN D (ERGOCALCIFEROL) 1.25 MG (50000 UNIT) PO CAPS: 50000.0000 [IU] | ORAL_CAPSULE | ORAL | 0 refills | Status: DC

## 2015-12-21 NOTE — Assessment & Plan Note (Signed)
Decision today to treat with OMT was based on Physical Exam  After verbal consent patient was treated with HVLA, ME techniques in thoracic, lumbar and sacral areas  Patient tolerated the procedure well with improvement in symptoms  Patient given exercises, stretches and lifestyle modifications  See medications in patient instructions if given  Patient will follow up in 4 weeks    

## 2015-12-21 NOTE — Assessment & Plan Note (Signed)
Encourage patient to continue to try to lose weight.

## 2015-12-21 NOTE — Assessment & Plan Note (Signed)
Discussed with patient at great length. Discussed that I do not think the pain medications will be significantly beneficial for him. We'll start him on once weekly vitamin D to help with muscle strength and endurance. We discussed the importance of stability and symmetric range of motion at this point. Patient actually has more of a hypermobility syndrome that I think contributes to the problem. We discussed core strengthening instability. Work with Event organiserathletic trainer to learn home exercises in greater detail. We discussed icing regimen and patient given prescription for topical anti-inflammatories. Patient will try some over-the-counter medications. Labs to rule out any autoimmune disease secondary to multiple joint pains. Patient will come back and see me again in 4 weeks.

## 2015-12-21 NOTE — Patient Instructions (Addendum)
Good to see you  We will give you exercises to help and I want you to do them 3 times a week.  We will focus more on strengthening Exercises on wall.  Heel and butt touching.  Raise leg 6 inches and hold 2 seconds.  Down slow for count of 4 seconds.  1 set of 30 reps daily on both sides.  On wall with heels, butt shoulder and head touching for a goal of 5 minutes daily  Labs downstairs Once weekly vitamin D for 12 weeks pennsaid pinkie amount topically 2 times daily as needed.  My other exercises 3 times a week only  Over the counter Turmeric 500mg  twice daily  See me again in 4 weeks and we will make sure you are making progress.

## 2015-12-22 LAB — ANGIOTENSIN CONVERTING ENZYME: ANGIOTENSIN-CONVERTING ENZYME: 16 U/L (ref 9–67)

## 2015-12-22 LAB — CYCLIC CITRUL PEPTIDE ANTIBODY, IGG: Cyclic Citrullin Peptide Ab: <16 Units

## 2015-12-22 LAB — ANTI-DNA ANTIBODY, DOUBLE-STRANDED: ds DNA Ab: 1 IU/mL

## 2015-12-22 LAB — RHEUMATOID FACTOR: Rhuematoid fact SerPl-aCnc: <14 IU/mL (ref <14)

## 2015-12-22 LAB — ANA: Anti Nuclear Antibody(ANA): NEGATIVE

## 2015-12-23 LAB — CENTROMERE ANTIBODIES: CENTROMERE AB SCREEN: NEGATIVE

## 2015-12-24 NOTE — Progress Notes (Unsigned)
Another normal lab which is good.

## 2016-01-12 ENCOUNTER — Other Ambulatory Visit: Payer: Self-pay | Admitting: Physician Assistant

## 2016-01-15 NOTE — Telephone Encounter (Signed)
04/2015 last ov and labs Last refill 12/14/15

## 2016-01-17 ENCOUNTER — Encounter: Payer: Self-pay | Admitting: Physician Assistant

## 2016-01-17 ENCOUNTER — Ambulatory Visit (INDEPENDENT_AMBULATORY_CARE_PROVIDER_SITE_OTHER): Payer: 59 | Admitting: Physician Assistant

## 2016-01-17 VITALS — BP 138/88 | HR 70 | Temp 98.3°F | Resp 16 | Wt 240.6 lb

## 2016-01-17 DIAGNOSIS — G4700 Insomnia, unspecified: Secondary | ICD-10-CM | POA: Diagnosis not present

## 2016-01-17 DIAGNOSIS — Z79891 Long term (current) use of opiate analgesic: Secondary | ICD-10-CM

## 2016-01-17 DIAGNOSIS — M5134 Other intervertebral disc degeneration, thoracic region: Secondary | ICD-10-CM | POA: Diagnosis not present

## 2016-01-17 DIAGNOSIS — Z23 Encounter for immunization: Secondary | ICD-10-CM | POA: Diagnosis not present

## 2016-01-17 DIAGNOSIS — Z114 Encounter for screening for human immunodeficiency virus [HIV]: Secondary | ICD-10-CM | POA: Diagnosis not present

## 2016-01-17 DIAGNOSIS — F988 Other specified behavioral and emotional disorders with onset usually occurring in childhood and adolescence: Secondary | ICD-10-CM | POA: Diagnosis not present

## 2016-01-17 MED ORDER — HYDROCODONE-ACETAMINOPHEN 10-325 MG PO TABS: 1.0000 | ORAL_TABLET | Freq: Three times a day (TID) | ORAL | 0 refills | Status: DC | PRN

## 2016-01-17 NOTE — Progress Notes (Signed)
Patient ID: Frank [name], male    DOB: 11/09/1990, 25 y.o.   MRN: [id number]  PCP: Porfirio Oar, PA-C  Chief Complaint  Patient presents with  . Follow-up    med check    Subjective:   Presents for medication refills.  [name] has a long history of thoracic back pain due to Shuermann syndrome which causes kyphosis and degenerative changes in his back. As a result, he has experienced significant situational stress with depression and anxiety. In turn, these made managing his pain more difficult, as does lack of restorative sleep. At one point he was diagnosed with OCD, but once his ADD was well treated, those symptoms resolved and it is thought that OCD was not the correct initial diagnosis.  He has seen multiple specialists and tried multiple treatments, none of which have been particularly helpful. These include:  Accupuncture Yoga Stretching General exercise PT Cupping Massage Myofascial release Pain psychologists Antidepressants Anxiolytics Muscle relaxers NSAIDS Opiates Mood stabilizers Steroid injections  I last saw him in 04/2015, when he was preparing for surgery.  He ultimately did not have the planned surgery, and Dr. Otelia Sergeant referred him to Dr. Lorenso Courier at [facility].   In 06/2015 he saw Dr. Jannette Fogo, MD at [facility] was was advised that Scheuermann's kyphosis does not explain his constant pain, as the degree of kyphosis is modest. At that time myofascial pain disorder was thought a more likely cause.  From that note:  He actually feels better when exercising, and this is common with myofascial pain disorders. Myofascial pain is most commonly related to stress, anxiety, and sleeping disorders. He has a visit with a pain psychologist but would probably benefit from a psychiatric evaluation for pharmacological management. He also says that he feels better when he does physical activities at his age he would need to do at least a 35-40 minutes non-stop cardiovascular  activities of any nature that he can tolerate well. Apparently in the pool he feels very well. This would be a great opportunity and he can also work out on a recumbent stationary bicycle. The only unexplored area of infrequent causes for constant pain symptoms are inflammatory arthritides. These commonly manifest in young male patient and are sometimes very difficult to diagnose. I think a rheumatological evaluation would also be warranted.   [name] has been very reluctant to understanding the reasoning behind this type of pain disorder. He has had extensive evaluations and imaging studies and overall what makes the least sense is the pain he has when he is at rest or laying down. This would definitely not be explained by a mechanical situation of the upper or lower back. He has become very irritated. I explained that I understand his frustrations. He has been going to multiple clinics for the last 18 months. I have just come to meet him today but unfortunately, I just cannot support the indication of a corrective surgery while this will unlikely address his pain symptoms. His thoracic kyphosis is barely beyond normal limits and very distant from affecting lung function or cardiovascular performance. After almost an hour-long visit, I have apologized for the need to continue with the clinic and have multiple patients's waiting at this moment. I have offered them the possibility to continue talking about solutions after the end of clinic at approximately 12:30 pm. The patient has not wanted to further discuss treatment options. I have given them my office numbers if they would like to further discuss treatment recommendations, we will be happy  to support.   He then saw Dr. Diamantina Providence at [facility] 10/2015. Diagnosis there was DDD. Again, he was advised against surgery, and yoga, stretching, aquatic therapy, active lifestyle and healthy diet were the treatment advised. In addition, he was  referred to Dr. Park Liter for possible spinal cord stimulator. Dr. Beverely Pace prescribed gabapentin, Cymbalta, PT and he was advised to seek evaluation for possible MS with his neurologist.   The patient relates that he also saw Dr. Jacki Cones who advised him that there is no surgery that can fix his pain. He felt discounted and "blown off."  Has not had a pain free day or full night of sleep in the past 18 months. Ambien is less helpful now. Uses it only a few times/week now. Can usually fall asleep without difficulty because he's so tired.  Most recently he has seen Dr. Terrilee Files, who he describes as a Palliative Care expert, and whom I know as a fellowship-trained sports medicine physician.  He was started on vitamin D and has embarked on a new PT regimen, focusing on core strengthening and addressing some hypermobility that is likely contributing. Labs to evaluate for possible rheumatologic disorder were normal, except for vitamin D which was slightly low (27).  He was prescribed topical NSAIDS, which he states have not helped, and Turmeric 500 mg BID. The patient finds that the PT is quite rigorous, and so far has worsened his pain, but he plans to continue it and hopes that he'll ultimately see improvement.   Controlled Substance Overview  Indication for chronic opioid: chronic thoracic back pain Medication and dose: Hydrocodone-acetaminophen 10/325, 1/2-1 2-3 times daily for pain # pills per month: 70 Last UDS date: 04/24/2015 Pain contract signed (Y/N): not yet Date narcotic database last reviewed (include red flags): today (all prescriptions known to me).   Depression screen [facility] 2/9 01/17/2016 04/24/2015 01/03/2015 08/13/2013  Decreased Interest 0 0 1 0  Down, Depressed, Hopeless 0 0 1 0  PHQ - 2 Score 0 0 2 0  Altered sleeping - - 3 -  Tired, decreased energy - - 3 -  Change in appetite - - 0 -  Feeling bad or failure about yourself  - - 0 -  Trouble concentrating - - 2 -  Moving  slowly or fidgety/restless - - 2 -  Suicidal thoughts - - 0 -  PHQ-9 Score - - 12 -  Difficult doing work/chores - - Somewhat difficult -     reports that he drinks about 7.2 oz of alcohol per week .    reports that he does not use drugs.      Opioid Risk Tool - 01/17/16 0800      Family History of Substance Abuse   Alcohol Negative   Illegal Drugs Negative   Rx Drugs Negative     Personal History of Substance Abuse   Alcohol Negative   Illegal Drugs Negative   Rx Drugs Negative     Age   Age between 39-45 years  Yes     History of Preadolescent Sexual Abuse   History of Preadolescent Sexual Abuse Negative or Male     Psychological Disease   Psychological Disease Positive   ADD Positive   OCD Negative   Bipolar Negative   Schizophrenia Negative   Depression Negative     Total Score   Total Score 3       Review of Systems  Constitutional: Positive for fatigue. Negative for chills  and fever. Unexpected weight change: expected: has lost about 60 lbs intentionally.  Respiratory: Negative for cough and shortness of breath.   Cardiovascular: Negative for chest pain, palpitations and leg swelling.  Gastrointestinal: Negative for constipation, diarrhea, nausea and vomiting.  Genitourinary: Negative for flank pain, frequency, hematuria and urgency.  Musculoskeletal: Positive for back pain. Negative for gait problem, joint swelling and neck stiffness.  Skin: Negative for rash and wound.  Neurological: Negative for dizziness, weakness, light-headedness and headaches.  Psychiatric/Behavioral: Positive for dysphoric mood ("it's really frustrating to have pain all the time) and sleep disturbance (pain wakes him repeatedly every night). Negative for agitation, behavioral problems, confusion, decreased concentration, self-injury and suicidal ideas. The patient is not nervous/anxious.        Patient Active Problem List   Diagnosis Date Noted  . Scheuermann's disease  12/21/2015  . Nonallopathic lesion of thoracic region 12/21/2015  . Nonallopathic lesion of sacral region 12/21/2015  . Nonallopathic lesion of lumbosacral region 12/21/2015  . Myofascial pain 10/12/2014  . Kyphosis of thoracic region 10/02/2014  . Degeneration of intervertebral disc of thoracic region 10/02/2014  . Excessive daytime sleepiness 03/04/2014  . Patient overweight 03/04/2014  . Snoring 03/04/2014  . Insomnia 01/30/2012  . ADD (attention deficit disorder) 08/23/2011  . Acne 08/23/2011  . History of OCD (obsessive compulsive disorder) 08/23/2011  . BMI 38.0-38.9,adult 08/23/2011  . Narcolepsy 07/26/2011     Prior to Admission medications   Medication Sig Start Date End Date Taking? Authorizing Provider  ALPRAZolam Prudy Feeler(XANAX) 0.5 MG tablet TAKE 1 TABLET BY MOUTH TWICE DAILY AS NEEDED FOR ANXIETY 12/14/15  Yes Chantil Bari, PA-C  amphetamine-dextroamphetamine (ADDERALL XR) 30 MG 24 hr capsule Take 2 capsules (60 mg total) by mouth daily. For 30d after signed 07/08/15  Yes Laya Letendre, PA-C  amphetamine-dextroamphetamine (ADDERALL) 30 MG tablet Take 1.5 tablets by mouth daily. At 4pm. For 30d after signed 07/08/15  Yes Sharrieff Spratlin, PA-C  Diclofenac Sodium (PENNSAID) 2 % SOLN Apply 1 pump twice daily as needed. 12/21/15  Yes Judi SaaZachary M Smith, DO  HYDROcodone-acetaminophen [facility](NORCO) 10-325 MG tablet 1/2 -1 tab 2-3 times a day for pain control prior to surgery 06/20/15 12/16/15  Yes Morrell RiddleSarah L Weber, PA-C  Vitamin D, Ergocalciferol, (DRISDOL) 50000 units CAPS capsule Take 1 capsule (50,000 Units total) by mouth every 7 (seven) days. 12/21/15  Yes Judi SaaZachary M Smith, DO  zolpidem (AMBIEN) 10 MG tablet Take 1 tablet (10 mg total) by mouth at bedtime as needed. 12/16/15  Yes Morrell RiddleSarah L Weber, PA-C  amphetamine-dextroamphetamine (ADDERALL XR) 30 MG 24 hr capsule Take 2 capsules (60 mg total) by mouth every morning. For 60 days after signed 07/08/15   Porfirio Oarhelle Jemell Town, PA-C  amphetamine-dextroamphetamine  (ADDERALL XR) 30 MG 24 hr capsule Take 2 capsules (60 mg total) by mouth daily. 12/16/15   Morrell RiddleSarah L Weber, PA-C  amphetamine-dextroamphetamine (ADDERALL) 30 MG tablet Take 1.5 tablets by mouth daily. At 4pm. For 60 days after signed 07/08/15   Porfirio Oarhelle Ilhan Madan, PA-C  amphetamine-dextroamphetamine (ADDERALL) 30 MG tablet Take 1.5 tablets by mouth daily. 4pm 12/16/15   Morrell RiddleSarah L Weber, PA-C     No Known Allergies     Objective:  Physical Exam  Constitutional: He is oriented to person, place, and time. He appears well-developed and well-nourished. He is active and cooperative. No distress.  BP 138/88 (BP Location: Right Arm, Cuff Size: Large)   Pulse 70   Temp 98.3 F (36.8 C) (Oral)   Resp 16  Wt 240 lb 9.6 oz (109.1 kg)   SpO2 97%   BMI 32.63 kg/m   HENT:  Head: Normocephalic and atraumatic.  Right Ear: Hearing normal.  Left Ear: Hearing normal.  Eyes: Conjunctivae are normal. No scleral icterus.  Neck: Normal range of motion. Neck supple. No thyromegaly present.  Cardiovascular: Normal rate, regular rhythm and normal heart sounds.   Pulses:      Radial pulses are 2+ on the right side, and 2+ on the left side.  Pulmonary/Chest: Effort normal and breath sounds normal.  Lymphadenopathy:       Head (right side): No tonsillar, no preauricular, no posterior auricular and no occipital adenopathy present.       Head (left side): No tonsillar, no preauricular, no posterior auricular and no occipital adenopathy present.    He has no cervical adenopathy.       Right: No supraclavicular adenopathy present.       Left: No supraclavicular adenopathy present.  Neurological: He is alert and oriented to person, place, and time. No sensory deficit.  Skin: Skin is warm, dry and intact. No rash noted. No cyanosis or erythema. Nails show no clubbing.  Psychiatric: He has a normal mood and affect. His speech is normal and behavior is normal.           Assessment & Plan:   1. Attention deficit  disorder (ADD) without hyperactivity Stable. Continue current treatment. Next Rx due in about a month.  2. Degeneration of intervertebral disc of thoracic region This is the primary problem. He's engaged in a new PT program that he is hopeful will help. As a result, his pain is temporarily worse, and he's not getting much relief from the current dose of his pain medication. I agree to increase the dose only if we eliminate the benzodiazepine, which he agrees to. - HYDROcodone-acetaminophen (NORCO) 10-325 MG tablet; Take 1-2 tablets by mouth 3 (three) times daily as needed.  Dispense: 90 tablet; Refill: 0  3. Insomnia, unspecified type Continue PRN zolpidem.  4. Chronic prescription opiate use Update CMET. Counseled on risks associated with long-term opiate use. Once this PT is over, plan to reduce dose again, and if unhelpful, consider D/C. Contract signed today. Opioid risk tool suggests that he is low risk: annual drug screening recommended. - Comprehensive metabolic panel  5. Screening for HIV (human immunodeficiency virus) - HIV antibody  6. Need for influenza vaccination - Flu Vaccine QUAD 36+ mos IM   Fernande Brashelle S. Bunyan Brier, PA-C Physician Assistant-Certified [facility]

## 2016-01-17 NOTE — Patient Instructions (Signed)
     IF you received an x-ray today, you will receive an invoice from [facility]. Please contact [facility] at [phone] with questions or concerns regarding your invoice.   IF you received labwork today, you will receive an invoice from [facility]. Please contact [facility] at [phone] with questions or concerns regarding your invoice.   Our billing staff will not be able to assist you with questions regarding bills from these companies.  You will be contacted with the lab results as soon as they are available. The fastest way to get your results is to activate your My Chart account. Instructions are located on the last page of this paperwork. If you have not heard from us regarding the results in 2 weeks, please contact this office.      

## 2016-01-18 LAB — COMPREHENSIVE METABOLIC PANEL
A/G RATIO: 1.9 (ref 1.2–2.2)
ALBUMIN: 5.3 g/dL (ref 3.5–5.5)
ALT: 50 IU/L — AB (ref 0–44)
AST: 26 IU/L (ref 0–40)
Alkaline Phosphatase: 57 IU/L (ref 39–117)
BUN/Creatinine Ratio: 26 — ABNORMAL HIGH (ref 9–20)
BUN: 25 mg/dL — ABNORMAL HIGH (ref 6–20)
Bilirubin Total: 1 mg/dL (ref 0.0–1.2)
CALCIUM: 10.3 mg/dL — AB (ref 8.7–10.2)
CO2: 22 mmol/L (ref 18–29)
Chloride: 102 mmol/L (ref 96–106)
Creatinine, Ser: 0.96 mg/dL (ref 0.76–1.27)
GFR, EST AFRICAN AMERICAN: 127 mL/min/{1.73_m2} (ref >59)
GFR, EST NON AFRICAN AMERICAN: 110 mL/min/{1.73_m2} (ref >59)
Globulin, Total: 2.8 g/dL (ref 1.5–4.5)
Glucose: 101 mg/dL — ABNORMAL HIGH (ref 65–99)
POTASSIUM: 4.7 mmol/L (ref 3.5–5.2)
Sodium: 144 mmol/L (ref 134–144)
TOTAL PROTEIN: 8.1 g/dL (ref 6.0–8.5)

## 2016-01-18 LAB — HIV ANTIBODY (ROUTINE TESTING W REFLEX): HIV SCREEN 4TH GENERATION: NONREACTIVE

## 2016-01-21 ENCOUNTER — Ambulatory Visit: Payer: PRIVATE HEALTH INSURANCE | Admitting: Family Medicine

## 2016-01-21 ENCOUNTER — Ambulatory Visit: Payer: 59

## 2016-01-23 NOTE — Progress Notes (Signed)
Tawana ScaleZach Augusten Lipkin D.O. [facility] [address] [place], [place] [address] Phone: [phone] Subjective:     CC: Multiple joint pain f/u   VHQ:[id number]:Subjective  Frank PallSamuel J [name] is a 25 y.o. male coming in with complaint of multiple joint pain. Patient has seen many different providers and is still considering a spinal stimulator is secondary to the amount of chronic pain.   Patient states unfortunately continues to have chronic left-sided thoracic spine pain with sometimes radiation down the leg. Patient also could have sensations that seem to be in the upper extremities. Patient was to try some over-the-counter medications as well as once weekly vitamin D. We change patient's exercises to more core strengthening instability. Patient states No significant improvement. Has been doing some the exercises and feels like this can exacerbate some the problem. Patient though has noticed that he is having more popping recently but not as much as pain.  Patient also had laboratory workup at last exam. Patient was found to have a low vitamin D 27 but autoimmune labs were unremarkable. Patient did have a complete metabolic panel in December that did show elevated calcium. Has noticed some increasing fatigue recently but states that the vitamin D supplementation has helped with some of his mood.   previous imaging: November 16 a CT scan of thoracic spine. Done have Scheuermann's disease. Seems to be mostly from T6-T12 and mild arthritic changes with kidney stone formation noted bilaterally.  Current Outpatient Prescriptions:  .  amphetamine-dextroamphetamine (ADDERALL XR) 30 MG 24 hr capsule, Take 2 capsules (60 mg total) by mouth daily. For 30d after signed, Disp: 60 capsule, Rfl: 0 .  amphetamine-dextroamphetamine (ADDERALL XR) 30 MG 24 hr capsule, Take 2 capsules (60 mg total) by mouth every morning. For 60 days after signed, Disp: 60 capsule, Rfl: 0 .  amphetamine-dextroamphetamine (ADDERALL XR) 30  MG 24 hr capsule, Take 2 capsules (60 mg total) by mouth daily., Disp: 60 capsule, Rfl: 0 .  amphetamine-dextroamphetamine (ADDERALL) 30 MG tablet, Take 1.5 tablets by mouth daily. At 4pm. For 30d after signed, Disp: 45 tablet, Rfl: 0 .  amphetamine-dextroamphetamine (ADDERALL) 30 MG tablet, Take 1.5 tablets by mouth daily. At 4pm. For 60 days after signed, Disp: 45 tablet, Rfl: 0 .  amphetamine-dextroamphetamine (ADDERALL) 30 MG tablet, Take 1.5 tablets by mouth daily. 4pm, Disp: 45 tablet, Rfl: 0 .  Diclofenac Sodium (PENNSAID) 2 % SOLN, Apply 1 pump twice daily as needed., Disp: 112 g, Rfl: 3 .  HYDROcodone-acetaminophen (NORCO) 10-325 MG tablet, Take 1-2 tablets by mouth 3 (three) times daily as needed., Disp: 90 tablet, Rfl: 0 .  Vitamin D, Ergocalciferol, (DRISDOL) 50000 units CAPS capsule, Take 1 capsule (50,000 Units total) by mouth every 7 (seven) days., Disp: 12 capsule, Rfl: 0 .  zolpidem (AMBIEN) 10 MG tablet, Take 1 tablet (10 mg total) by mouth at bedtime as needed., Disp: 30 tablet, Rfl: 0  Past Medical History:  Diagnosis Date  . ADD (attention deficit disorder)   . Anxiety    Past Surgical History:  Procedure Laterality Date  . KNEE SURGERY    . WISDOM TOOTH EXTRACTION  2013   Social History   Social History  . Marital status: Single    Spouse name: N/A  . Number of children: N/A  . Years of education: N/A   Social History Main Topics  . Smoking status: Never Smoker  . Smokeless tobacco: Never Used  . Alcohol use 7.2 oz/week    7 Standard drinks  or equivalent, 5 Shots of liquor per week  . Drug use: No  . Sexual activity: Yes   Other Topics Concern  . None   Social History Narrative   Caffeine 1-2 cups a week   No Known Allergies Family History  Problem Relation Age of Onset  . Stroke Maternal Grandmother   . Cancer Paternal Grandmother     Breast  . Hypertension Paternal Grandmother   . Diabetes Paternal Grandfather   . Prostate cancer Father      and grandfather    Past medical history, social, surgical and family history all reviewed in electronic medical record.  No pertanent information unless stated regarding to the chief complaint.   Review of Systems: No headache, visual changes, nausea, vomiting, diarrhea, constipation, dizziness, abdominal pain, skin rash, fevers, chills, night sweats, weight loss, swollen lymph nodes,, chest pain, shortness of breath, mood changes.  Positive for joint pain, muscle aches.  Objective  Blood pressure 140/70, pulse 85, height 6' (1.829 m), weight 241 lb (109.3 kg), SpO2 97 %.   Systems examined below as of 01/24/16 General: NAD A&O x3 mood, affect normal  HEENT: Pupils equal, extraocular movements intact no nystagmus Respiratory: not short of breath at rest or with speaking Cardiovascular: No lower extremity edema, non tender Skin: Warm dry intact with no signs of infection or rash on extremities or on axial skeleton. Abdomen: Soft nontender, no masses Neuro: Cranial nerves  intact, neurovascularly intact in all extremities with 2+ DTRs and 2+ pulses. Lymph: No lymphadenopathy appreciated today  Gait normal with good balance and coordination.  MSK: Non tender with full range of motion and good stability and symmetric strength and tone of shoulders, elbows, wrist,  knee hips and ankles bilaterally.   MSK:  Non tender with full range of motion and good stability and symmetric strength and tone of shoulders, elbows, wrist, hip, knee and ankles bilaterally. Patient does have hypermobility of multiple joints. Beighton score 7    Back Exam:  Inspection: Unremarkable  Motion: Flexion 45 deg, Extension 35 deg, Side Bending to 45 deg bilaterally,  Rotation to 45 deg bilaterally excessive range of motion in all fields of plain. SLR laying: Negative  XSLR laying: Negative  Palpable tenderness: Continued tenderness in the thoracolumbar juncture mostly on the left FABER: negative. Sensory change:  Gross sensation intact to all lumbar and sacral dermatomes.  Reflexes: 2+ at both patellar tendons, 2+ at achilles tendons, Babinski's downgoing.  Strength at foot  Plantar-flexion: 5/5 Dorsi-flexion: 5/5 Eversion: 5/5 Inversion: 5/5  Leg strength  Quad: 5/5 Hamstring: 5/5 Hip flexor: 5/5 Hip abductors: 3/5 but symmetric Gait unremarkable. No significant change  Osteopathic findings T3 extended rotated and side bent right T7 extended rotated and side bent left L2 flexed rotated and side bent left Sacrum right on right      Impression and Recommendations:     This case required medical decision making of moderate complexity.      Note: This dictation was prepared with Dragon dictation along with smaller phrase technology. Any transcriptional errors that result from this process are unintentional.

## 2016-01-24 ENCOUNTER — Encounter: Payer: Self-pay | Admitting: Family Medicine

## 2016-01-24 ENCOUNTER — Ambulatory Visit (INDEPENDENT_AMBULATORY_CARE_PROVIDER_SITE_OTHER): Payer: PRIVATE HEALTH INSURANCE | Admitting: Family Medicine

## 2016-01-24 ENCOUNTER — Other Ambulatory Visit: Payer: PRIVATE HEALTH INSURANCE

## 2016-01-24 VITALS — BP 140/70 | HR 85 | Ht 72.0 in | Wt 241.0 lb

## 2016-01-24 DIAGNOSIS — M2550 Pain in unspecified joint: Secondary | ICD-10-CM | POA: Diagnosis not present

## 2016-01-24 DIAGNOSIS — M999 Biomechanical lesion, unspecified: Secondary | ICD-10-CM

## 2016-01-24 DIAGNOSIS — M4200 Juvenile osteochondrosis of spine, site unspecified: Secondary | ICD-10-CM | POA: Diagnosis not present

## 2016-01-24 NOTE — Assessment & Plan Note (Signed)
Decision today to treat with OMT was based on Physical Exam  After verbal consent patient was treated with HVLA, ME techniques in thoracic, lumbar and sacral areas  Patient tolerated the procedure well with improvement in symptoms  Patient given exercises, stretches and lifestyle modifications  See medications in patient instructions if given  Patient will follow up in 4-6 weeks          

## 2016-01-24 NOTE — Assessment & Plan Note (Signed)
Discussed with patient again. This is not constant significantly chronic pain. Encourage him to continue the same exercises and we are going to start some weight training. Discussed continuing the vitamin D. Encourage core strengthening. Discussed with patient at length about other activities to avoid. Patient did have hypercalcemia and we will check labs. Follow-up again 4-6 weeks.

## 2016-01-24 NOTE — Patient Instructions (Signed)
Good to see you  You are making progress.  Get labs downstairs.  Continue everything else we are dong When you can think about it keep your shoulders back  Work on posture.  Wall sits 30 seconds 3 sets daily  OK to do machine weights but 50% of what you were doing and increase 10% a week See me again in 4 weeks Happy holidays!

## 2016-01-25 LAB — PTH, INTACT AND CALCIUM
CALCIUM: 9.5 mg/dL (ref 8.6–10.3)
PTH: 19 pg/mL (ref 14–64)

## 2016-02-18 ENCOUNTER — Ambulatory Visit: Payer: PRIVATE HEALTH INSURANCE | Admitting: Family Medicine

## 2016-03-06 ENCOUNTER — Ambulatory Visit: Payer: PRIVATE HEALTH INSURANCE | Admitting: Family Medicine

## 2016-03-07 ENCOUNTER — Other Ambulatory Visit: Payer: Self-pay | Admitting: Physician Assistant

## 2016-03-07 DIAGNOSIS — F988 Other specified behavioral and emotional disorders with onset usually occurring in childhood and adolescence: Secondary | ICD-10-CM

## 2016-03-07 DIAGNOSIS — G4700 Insomnia, unspecified: Secondary | ICD-10-CM

## 2016-03-07 DIAGNOSIS — G47419 Narcolepsy without cataplexy: Secondary | ICD-10-CM

## 2016-03-07 DIAGNOSIS — M5134 Other intervertebral disc degeneration, thoracic region: Secondary | ICD-10-CM

## 2016-03-08 MED ORDER — HYDROCODONE-ACETAMINOPHEN 10-325 MG PO TABS: 1.0000 | ORAL_TABLET | Freq: Three times a day (TID) | ORAL | 0 refills | Status: DC | PRN

## 2016-03-08 NOTE — Telephone Encounter (Signed)
Meds ordered this encounter  Medications  . HYDROcodone-acetaminophen (NORCO) 10-325 MG tablet    Sig: Take 1-2 tablets by mouth 3 (three) times daily as needed.    Dispense:  90 tablet    Refill:  0   Patient notified via My Chart.

## 2016-03-09 NOTE — Telephone Encounter (Signed)
I am not sure why this came to me will send to appropriate provider.

## 2016-03-10 MED ORDER — ZOLPIDEM TARTRATE 10 MG PO TABS: 10.0000 mg | ORAL_TABLET | Freq: Every evening | ORAL | 0 refills | Status: DC | PRN

## 2016-03-10 MED ORDER — AMPHETAMINE-DEXTROAMPHETAMINE 30 MG PO TABS: 45.0000 mg | ORAL_TABLET | Freq: Every day | ORAL | 0 refills | Status: DC

## 2016-03-10 MED ORDER — AMPHETAMINE-DEXTROAMPHET ER 30 MG PO CP24: 60.0000 mg | ORAL_CAPSULE | Freq: Every day | ORAL | 0 refills | Status: DC

## 2016-03-10 NOTE — Telephone Encounter (Signed)
Meds ordered this encounter  Medications  . amphetamine-dextroamphetamine (ADDERALL XR) 30 MG 24 hr capsule    Sig: Take 2 capsules (60 mg total) by mouth daily.    Dispense:  60 capsule    Refill:  0  . amphetamine-dextroamphetamine (ADDERALL) 30 MG tablet    Sig: Take 1.5 tablets by mouth daily. 4pm    Dispense:  45 tablet    Refill:  0  . zolpidem (AMBIEN) 10 MG tablet    Sig: Take 1 tablet (10 mg total) by mouth at bedtime as needed.    Dispense:  30 tablet    Refill:  0    Patient notified via My Chart.

## 2016-03-17 ENCOUNTER — Other Ambulatory Visit: Payer: Self-pay | Admitting: Family Medicine

## 2016-03-26 NOTE — Progress Notes (Signed)
Frank ScaleZach Taydon [name] D.O. [facility] [address] [place], [place] [address] Phone: [phone] Subjective:     CC: Multiple joint pain f/u   ZHY:[id number]:Subjective  Frank PallSamuel J [name] is a 26 y.o. male coming in with complaint of multiple joint pain. Patient has seen many different providers and is still considering a spinal stimulator is secondary to the amount of chronic pain.   Patient states unfortunately continues to have chronic left-sided thoracic spine pain with sometimes radiation down the leg. The intramedullary exercises on a regular basis. We attempted to manipulation  Patient also had laboratory workup. Patient was found to have a low vitamin D 27 but autoimmune labs were unremarkable.   03/27/2016 update- patient has not noticed significant improvement with the vitamin D. States that he only has 1-2 days of improvement with the manipulation.  Patient says that he continues to have pain. Seems to increasing frequency but not in severity of the pain then he has been having. Patient states sometimes it can be so severe that it seems to be unrelenting and stops him from activities as well as sleeping at night.   Patient is also coming in with a new problem. States right foot pain. Was walking a significant amount in [place] and states that at the in the long days of having a throbbing sensation in the middle of his foot. Patient denies any numbness. States though that there is a tingling. Denies any weakness. Does not remember any true injury.  previous imaging: December 30 2015 a CT scan of thoracic spine. Done have Scheuermann's disease. Seems to be mostly from T6-T12 and mild arthritic changes with kidney stone formation noted bilaterally.  Current Outpatient Prescriptions:  .  amphetamine-dextroamphetamine (ADDERALL XR) 30 MG 24 hr capsule, Take 2 capsules (60 mg total) by mouth daily. For 30d after signed, Disp: 60 capsule, Rfl: 0 .  amphetamine-dextroamphetamine (ADDERALL XR) 30  MG 24 hr capsule, Take 2 capsules (60 mg total) by mouth every morning. For 60 days after signed, Disp: 60 capsule, Rfl: 0 .  amphetamine-dextroamphetamine (ADDERALL XR) 30 MG 24 hr capsule, Take 2 capsules (60 mg total) by mouth daily., Disp: 60 capsule, Rfl: 0 .  amphetamine-dextroamphetamine (ADDERALL) 30 MG tablet, Take 1.5 tablets by mouth daily. At 4pm. For 30d after signed, Disp: 45 tablet, Rfl: 0 .  amphetamine-dextroamphetamine (ADDERALL) 30 MG tablet, Take 1.5 tablets by mouth daily. At 4pm. For 60 days after signed, Disp: 45 tablet, Rfl: 0 .  amphetamine-dextroamphetamine (ADDERALL) 30 MG tablet, Take 1.5 tablets by mouth daily. 4pm, Disp: 45 tablet, Rfl: 0 .  Diclofenac Sodium (PENNSAID) 2 % SOLN, Apply 1 pump twice daily as needed., Disp: 112 g, Rfl: 3 .  HYDROcodone-acetaminophen (NORCO) 10-325 MG tablet, Take 1-2 tablets by mouth 3 (three) times daily as needed., Disp: 90 tablet, Rfl: 0 .  Vitamin D, Ergocalciferol, (DRISDOL) 50000 units CAPS capsule, TAKE 1 CAPSULE(50,000 UNITS TOTAL) BY MOUTH EVERY 7 DAYS, Disp: 12 capsule, Rfl: 0 .  zolpidem (AMBIEN) 10 MG tablet, Take 1 tablet (10 mg total) by mouth at bedtime as needed., Disp: 30 tablet, Rfl: 0 .  gabapentin (NEURONTIN) 100 MG capsule, Take 2 capsules (200 mg total) by mouth at bedtime., Disp: 60 capsule, Rfl: 3  Past Medical History:  Diagnosis Date  . ADD (attention deficit disorder)   . Anxiety    Past Surgical History:  Procedure Laterality Date  . KNEE SURGERY    . WISDOM TOOTH EXTRACTION  2013  Social History   Social History  . Marital status: Single    Spouse name: N/A  . Number of children: N/A  . Years of education: N/A   Social History Main Topics  . Smoking status: Never Smoker  . Smokeless tobacco: Never Used  . Alcohol use 7.2 oz/week    7 Standard drinks or equivalent, 5 Shots of liquor per week  . Drug use: No  . Sexual activity: Yes   Other Topics Concern  . None   Social History Narrative    Caffeine 1-2 cups a week   No Known Allergies Family History  Problem Relation Age of Onset  . Stroke Maternal Grandmother   . Cancer Paternal Grandmother     Breast  . Hypertension Paternal Grandmother   . Diabetes Paternal Grandfather   . Prostate cancer Father     and grandfather    Past medical history, social, surgical and family history all reviewed in electronic medical record.  No pertanent information unless stated regarding to the chief complaint.   Review of Systems: No headache, visual changes, nausea, vomiting, diarrhea, constipation, dizziness, abdominal pain, skin rash, fevers, chills, night sweats, weight loss, swollen lymph nodes, chest pain, shortness of breath, mood changes.  Marland Kitchen  Positive for joint pain, muscle aches.  Objective  Blood pressure 140/82, pulse 64, height 6' (1.829 m), weight 239 lb (108.4 kg).   Systems examined below as of 03/27/16 General: NAD A&O x3 mood, affect normal  HEENT: Pupils equal, extraocular movements intact no nystagmus Respiratory: not short of breath at rest or with speaking Cardiovascular: No lower extremity edema, non tender Skin: Warm dry intact with no signs of infection or rash on extremities or on axial skeleton. Abdomen: Soft nontender, no masses Neuro: Cranial nerves  intact, neurovascularly intact in all extremities with 2+ DTRs and 2+ pulses. Lymph: No lymphadenopathy appreciated today  Gait normal with good balance and coordination. .   MSK:  Non tender with full range of motion and good stability and symmetric strength and tone of shoulders, elbows, wrist, hip, knee and ankles bilaterally. Patient does have hypermobility of multiple joints. Beighton score 7  Exam on the right side shows patient does have breakdown of the transverse arch and mild rigid midfoot. Patient does have a positive squeeze test. Neurovascularly intact distally. Patient is tender to palpation between the second and third metatarsals.   Back  Exam:  Inspection: Unremarkable  Motion: Flexion 45 deg, Extension 35 degWith worsening symptoms, Side Bending to 45 deg bilaterally,  Rotation to 45 deg bilaterally excessive range of motion in all fields of plain. SLR laying: Negative  XSLR laying: Negative  Palpable tenderness: Continued tenderness in the thoracolumbar juncture mostly on the left seems to be out of proportion for the amount of palpation FABER: negative. Sensory change: Gross sensation intact to all lumbar and sacral dermatomes.  Reflexes: 2+ at both patellar tendons, 2+ at achilles tendons, Babinski's downgoing.  Strength at foot  Plantar-flexion: 5/5 Dorsi-flexion: 5/5 Eversion: 5/5 Inversion: 5/5  Leg strength  Quad: 5/5 Hamstring: 5/5 Hip flexor: 5/5 Hip abductors: 3+/5 but symmetric mild worsening of symptoms overall  Imaging muscular skeletal ultrasound was performed and interpreted by [name]  Limited ultrasound the patient's right foot shows the patient does have hypoechoic changes and nerve increased diameter between the second and third metatarsals. Subluxation of the nerve noted. Increasing Doppler flow. No bony abnormality noted. Impression: Morton's neuroma  Osteopathic findings C2 flexed rotated and side bent  right T3 extended rotated and side bent right inhaled third rib T8 extended rotated and side bent left L2 flexed rotated and side bent right Sacrum right on right       Impression and Recommendations:     This case required medical decision making of moderate complexity.      Note: This dictation was prepared with Dragon dictation along with smaller phrase technology. Any transcriptional errors that result from this process are unintentional.

## 2016-03-27 ENCOUNTER — Ambulatory Visit: Payer: Self-pay

## 2016-03-27 ENCOUNTER — Ambulatory Visit (INDEPENDENT_AMBULATORY_CARE_PROVIDER_SITE_OTHER): Payer: PRIVATE HEALTH INSURANCE | Admitting: Family Medicine

## 2016-03-27 ENCOUNTER — Encounter: Payer: Self-pay | Admitting: Family Medicine

## 2016-03-27 VITALS — BP 140/82 | HR 64 | Ht 72.0 in | Wt 239.0 lb

## 2016-03-27 DIAGNOSIS — M4200 Juvenile osteochondrosis of spine, site unspecified: Secondary | ICD-10-CM

## 2016-03-27 DIAGNOSIS — G5761 Lesion of plantar nerve, right lower limb: Secondary | ICD-10-CM

## 2016-03-27 DIAGNOSIS — M999 Biomechanical lesion, unspecified: Secondary | ICD-10-CM

## 2016-03-27 DIAGNOSIS — M79671 Pain in right foot: Secondary | ICD-10-CM

## 2016-03-27 MED ORDER — GABAPENTIN 100 MG PO CAPS: 200.0000 mg | ORAL_CAPSULE | Freq: Every day | ORAL | 3 refills | Status: DC

## 2016-03-27 NOTE — Patient Instructions (Addendum)
Good to see you  Frank [name] is your friend.  Gabapentin 200mg  at night Avoid being barefoot.  More low impact exercises for now.  Duexis 3 times a day for today then as needed.  See me again in 6 weeks.

## 2016-03-27 NOTE — Assessment & Plan Note (Signed)
Decision today to treat with OMT was based on Physical Exam  After verbal consent patient was treated with HVLA, ME, FPR techniques in cervical, thoracic, lumbar and sacral areas  Patient tolerated the procedure well with improvement in symptoms  Patient given exercises, stretches and lifestyle modifications  See medications in patient instructions if given  Patient will follow up in 3-6 weeks 

## 2016-03-27 NOTE — Assessment & Plan Note (Signed)
This case with patient at great length. This will likely be a chronic issue. Patient needs to do the exercises a more regular basis. We discussed decompression. Discussed the importance of vitamin D supplementation. Started gabapentin at night to see if this will be beneficial for the back pain as well as with the foot pain. We discussed proper shoes. Patient come back and see me again in 4-6 weeks.

## 2016-03-27 NOTE — Assessment & Plan Note (Signed)
New problem. Discussed over-the-counter orthotics, home exercises, we discussed proper shoes and proper time. Patient come back and see me again in 4 weeks and if not better consider physical therapy or injection.

## 2016-05-04 ENCOUNTER — Other Ambulatory Visit: Payer: Self-pay | Admitting: Physician Assistant

## 2016-05-04 DIAGNOSIS — G47419 Narcolepsy without cataplexy: Secondary | ICD-10-CM

## 2016-05-04 DIAGNOSIS — M5134 Other intervertebral disc degeneration, thoracic region: Secondary | ICD-10-CM

## 2016-05-04 DIAGNOSIS — G4700 Insomnia, unspecified: Secondary | ICD-10-CM

## 2016-05-04 DIAGNOSIS — F988 Other specified behavioral and emotional disorders with onset usually occurring in childhood and adolescence: Secondary | ICD-10-CM

## 2016-05-04 MED ORDER — ZOLPIDEM TARTRATE 10 MG PO TABS: 10.0000 mg | ORAL_TABLET | Freq: Every evening | ORAL | 0 refills | Status: DC | PRN

## 2016-05-04 MED ORDER — HYDROCODONE-ACETAMINOPHEN 10-325 MG PO TABS: 1.0000 | ORAL_TABLET | Freq: Three times a day (TID) | ORAL | 0 refills | Status: DC | PRN

## 2016-05-04 MED ORDER — AMPHETAMINE-DEXTROAMPHETAMINE 30 MG PO TABS: 45.0000 mg | ORAL_TABLET | Freq: Every day | ORAL | 0 refills | Status: DC

## 2016-05-04 MED ORDER — AMPHETAMINE-DEXTROAMPHET ER 30 MG PO CP24: 60.0000 mg | ORAL_CAPSULE | Freq: Every day | ORAL | 0 refills | Status: DC

## 2016-05-04 NOTE — Telephone Encounter (Signed)
Meds ordered this encounter  Medications  . amphetamine-dextroamphetamine (ADDERALL XR) 30 MG 24 hr capsule    Sig: Take 2 capsules (60 mg total) by mouth daily.    Dispense:  60 capsule    Refill:  0  . amphetamine-dextroamphetamine (ADDERALL) 30 MG tablet    Sig: Take 1.5 tablets by mouth daily. 4pm    Dispense:  45 tablet    Refill:  0  . zolpidem (AMBIEN) 10 MG tablet    Sig: Take 1 tablet (10 mg total) by mouth at bedtime as needed.    Dispense:  30 tablet    Refill:  0  . HYDROcodone-acetaminophen (NORCO) 10-325 MG tablet    Sig: Take 1-2 tablets by mouth 3 (three) times daily as needed.    Dispense:  90 tablet    Refill:  0   Patient notified via My Chart.

## 2016-05-05 NOTE — Telephone Encounter (Signed)
Up front pt advised 

## 2016-05-08 ENCOUNTER — Telehealth: Payer: Self-pay

## 2016-05-08 ENCOUNTER — Ambulatory Visit (INDEPENDENT_AMBULATORY_CARE_PROVIDER_SITE_OTHER): Payer: PRIVATE HEALTH INSURANCE | Admitting: Family Medicine

## 2016-05-08 ENCOUNTER — Encounter: Payer: Self-pay | Admitting: Family Medicine

## 2016-05-08 VITALS — BP 118/72 | HR 76 | Ht 72.0 in | Wt 239.4 lb

## 2016-05-08 DIAGNOSIS — G5761 Lesion of plantar nerve, right lower limb: Secondary | ICD-10-CM

## 2016-05-08 DIAGNOSIS — M999 Biomechanical lesion, unspecified: Secondary | ICD-10-CM

## 2016-05-08 DIAGNOSIS — M4200 Juvenile osteochondrosis of spine, site unspecified: Secondary | ICD-10-CM | POA: Diagnosis not present

## 2016-05-08 MED ORDER — GABAPENTIN 300 MG PO CAPS: 300.0000 mg | ORAL_CAPSULE | Freq: Every day | ORAL | 3 refills | Status: DC

## 2016-05-08 NOTE — Assessment & Plan Note (Signed)
Decision today to treat with OMT was based on Physical Exam  After verbal consent patient was treated with HVLA, ME, FPR techniques in cervical, thoracic, rib lumbar and sacral areas  Patient tolerated the procedure well with improvement in symptoms  Patient given exercises, stretches and lifestyle modifications  See medications in patient instructions if given  Patient will follow up in 4-6 weeks 

## 2016-05-08 NOTE — Patient Instructions (Addendum)
Good to see you  Ice is still god OK to lift but 25% of what you were doing.  Gabapentin 300mg  at night Injected the foot and should help See me again in 4-6 weeks.  You are making progress

## 2016-05-08 NOTE — Progress Notes (Signed)
Frank ScaleZach Josejulian [name] D.O. [facility] [address] [place], [place] [address] Phone: [phone] Subjective:     CC: Multiple joint pain f/u   NGE:[id number]:Subjective  Frank [name] is a 26 y.o. male coming in with complaint of multiple joint pain. Patient has seen many different providers and is still considering a spinal stimulator is secondary to the amount of chronic pain.   Patient states unfortunately continues to have chronic left-sided thoracic spine pain with sometimes radiation down the leg. The intramedullary exercises on a regular basis. We attempted to manipulation  Patient also had laboratory workup. Patient was found to have a low vitamin D 27 but autoimmune labs were unremarkable.   03/27/2016 update- patient has not noticed significant improvement with the vitamin D. States that he only has 1-2 days of improvement with the manipulation.  Patient says that he continues to have pain. Seems to increasing frequency but not in severity of the pain then he has been having. Patient states sometimes it can be so severe that it seems to be unrelenting and stops him from activities as well as sleeping at night.   Patient is also coming in with a new problem. States right foot pain. Was walking a significant amount in [place] and states that at the in the long days of having a throbbing sensation in the middle of his foot. Patient denies any numbness. States though that there is a tingling. Denies any weakness. Does not remember any true injury.  May 08 2016 update- patient continues to have cramping pain. We didn't change some different exercises. Patient was also started on gabapentin for some of the relief at nighttime for this as well as patient's neuroma of the right foot. Patient states Regarding his back he seems to be doing relatively well. States and still making some progress. States that it is very slow. Not making significant strides aunt this time. Patient though states that  there is no worsening of symptoms.  An states that unfortunately the foot continues to give him pain. Tries to work out on a regular basis but finds it difficult because the foot causes significant pain and numbness. Has not been doing exercises regularly but hasn't been icing.  previous imaging: December 30 2015 a CT scan of thoracic spine. Done have Scheuermann's disease. Seems to be mostly from T6-T12 and mild arthritic changes with kidney stone formation noted bilaterally.  Current Outpatient Prescriptions:  .  amphetamine-dextroamphetamine (ADDERALL XR) 30 MG 24 hr capsule, Take 2 capsules (60 mg total) by mouth daily., Disp: 60 capsule, Rfl: 0 .  amphetamine-dextroamphetamine (ADDERALL) 30 MG tablet, Take 1.5 tablets by mouth daily. 4pm, Disp: 45 tablet, Rfl: 0 .  Diclofenac Sodium (PENNSAID) 2 % SOLN, Apply 1 pump twice daily as needed., Disp: 112 g, Rfl: 3 .  gabapentin (NEURONTIN) 100 MG capsule, Take 2 capsules (200 mg total) by mouth at bedtime., Disp: 60 capsule, Rfl: 3 .  HYDROcodone-acetaminophen (NORCO) 10-325 MG tablet, Take 1-2 tablets by mouth 3 (three) times daily as needed., Disp: 90 tablet, Rfl: 0 .  Vitamin D, Ergocalciferol, (DRISDOL) 50000 units CAPS capsule, TAKE 1 CAPSULE(50,000 UNITS TOTAL) BY MOUTH EVERY 7 DAYS, Disp: 12 capsule, Rfl: 0 .  zolpidem (AMBIEN) 10 MG tablet, Take 1 tablet (10 mg total) by mouth at bedtime as needed., Disp: 30 tablet, Rfl: 0  Past Medical History:  Diagnosis Date  . ADD (attention deficit disorder)   . Anxiety    Past Surgical History:  Procedure Laterality Date  . KNEE SURGERY    . WISDOM TOOTH EXTRACTION  2013   Social History   Social History  . Marital status: Single    Spouse name: N/A  . Number of children: N/A  . Years of education: N/A   Social History Main Topics  . Smoking status: Never Smoker  . Smokeless tobacco: Never Used  . Alcohol use 7.2 oz/week    7 Standard drinks or equivalent, 5 Shots of liquor per  week  . Drug use: No  . Sexual activity: Yes   Other Topics Concern  . None   Social History Narrative   Caffeine 1-2 cups a week   No Known Allergies Family History  Problem Relation Age of Onset  . Stroke Maternal Grandmother   . Cancer Paternal Grandmother     Breast  . Hypertension Paternal Grandmother   . Diabetes Paternal Grandfather   . Prostate cancer Father     and grandfather    Past medical history, social, surgical and family history all reviewed in electronic medical record.  No pertanent information unless stated regarding to the chief complaint.   Review of Systems: No headache, visual changes, nausea, vomiting, diarrhea, constipation, dizziness, abdominal pain, skin rash, fevers, chills, night sweats, weight loss, swollen lymph nodes, chest pain, shortness of breath, mood changes.   Positive for joint pain, muscle aches.  Objective  Blood pressure 118/72, pulse 76, height 6' (1.829 m), weight 239 lb 6.4 oz (108.6 kg), SpO2 97 %.   Systems examined below as of 05/08/16 General: NAD A&O x3 mood, affect normal  HEENT: Pupils equal, extraocular movements intact no nystagmus Respiratory: not short of breath at rest or with speaking Cardiovascular: No lower extremity edema, non tender Skin: Warm dry intact with no signs of infection or rash on extremities or on axial skeleton. Abdomen: Soft nontender, no masses Neuro: Cranial nerves  intact, neurovascularly intact in all extremities with 2+ DTRs and 2+ pulses. Lymph: No lymphadenopathy appreciated today  Gait normal with good balance and coordination.  MSK:  Mild tender with full range of motion and good stability and symmetric strength and tone of shoulders, elbows, wrist, hip, knee and ankles bilaterally. Patient does have hypermobility of multiple joints. Beighton score 7  Exam on the right side shows patient does have breakdown of the transverse arch and mild rigid midfoot. Patient does have a positive squeeze  test. Neurovascularly intact distally. Patient is tender to palpation between the second and third metatarsals.no change from previous exam.    Back Exam:  Inspection: Unremarkable  Motion: Flexion 45 deg, Extension 35 deg but not as severe pain  Side Bending to 45 deg bilaterally, Rotation to 45 deg bilaterally  SLR laying: Negative  XSLR laying: Negative  Palpable tenderness: Continued tenderness in the thoracolumbar juncture left greater then right  FABER: negative. Sensory change: Gross sensation intact to all lumbar and sacral dermatomes.  Reflexes: 2+ at both patellar tendons, 2+ at achilles tendons, Babinski's downgoing.  Strength at foot  Plantar-flexion: 5/5 Dorsi-flexion: 5/5 Eversion: 5/5 Inversion: 5/5  Leg strength  Quad: 5/5 Hamstring: 5/5 Hip flexor: 5/5 Hip abductors: 3+/5 but symmetric mild worsening of symptoms overall  After verbal consent patient was prepped with alcohol pads and with a 25-gauge half-inch needle was injected with a total of 0.5 mL of 0.5% Marcaine and 0.5 mL of Kenalog 40 mg/dL within second and third intermetatarsal space. No blood loss. Post injection instructions given Osteopathic findings Cervical C2  flexed rotated and side bent right C4 flexed rotated and side bent left C6 flexed rotated and side bent left T3 extended rotated and side bent right inhaled third rib T9 extended rotated and side bent left L2 flexed rotated and side bent right Sacrum right on right'       Impression and Recommendations:     This case required medical decision making of moderate complexity.      Note: This dictation was prepared with Dragon dictation along with smaller phrase technology. Any transcriptional errors that result from this process are unintentional.

## 2016-05-08 NOTE — Assessment & Plan Note (Signed)
Patient given injection today and tolerated the procedure well. We discussed icing regimen and home exercises. We discussed which activities doing which ones to encourage patient to increase activity as tolerated. Follow-up again 4-6 weeks. Discussed lacing shoe and proper shoes and avoiding being barefoot.

## 2016-05-08 NOTE — Telephone Encounter (Signed)
ZOLPIDEM NEEDS pa [phone] ID [id number]

## 2016-05-08 NOTE — Assessment & Plan Note (Signed)
Stable overall. Encourage patient to continue to work on the exercises we have given him. Patient has done formal physical therapy and multiple things previously. Encourage him to continue the once weekly vitamin D. Increase gabapentin 300 mg. Patient encouraged to do more icing. Continues to respond fairly well to osteopathic manipulation. Follow-up again in 4-6 weeks.

## 2016-05-09 ENCOUNTER — Other Ambulatory Visit: Payer: Self-pay | Admitting: Physician Assistant

## 2016-05-09 DIAGNOSIS — G47419 Narcolepsy without cataplexy: Secondary | ICD-10-CM

## 2016-05-09 DIAGNOSIS — G4700 Insomnia, unspecified: Secondary | ICD-10-CM

## 2016-06-12 ENCOUNTER — Ambulatory Visit: Payer: PRIVATE HEALTH INSURANCE | Admitting: Family Medicine

## 2016-06-13 ENCOUNTER — Other Ambulatory Visit: Payer: Self-pay | Admitting: Family Medicine

## 2016-06-13 ENCOUNTER — Telehealth: Payer: Self-pay | Admitting: Physician Assistant

## 2016-06-13 DIAGNOSIS — F988 Other specified behavioral and emotional disorders with onset usually occurring in childhood and adolescence: Secondary | ICD-10-CM

## 2016-06-13 DIAGNOSIS — G4700 Insomnia, unspecified: Secondary | ICD-10-CM

## 2016-06-13 DIAGNOSIS — G47419 Narcolepsy without cataplexy: Secondary | ICD-10-CM

## 2016-06-13 DIAGNOSIS — M5134 Other intervertebral disc degeneration, thoracic region: Secondary | ICD-10-CM

## 2016-06-13 MED ORDER — VITAMIN D (ERGOCALCIFEROL) 1.25 MG (50000 UNIT) PO CAPS: 50000.0000 [IU] | ORAL_CAPSULE | ORAL | 0 refills | Status: DC

## 2016-06-14 MED ORDER — ZOLPIDEM TARTRATE 10 MG PO TABS: 10.0000 mg | ORAL_TABLET | Freq: Every evening | ORAL | 0 refills | Status: DC | PRN

## 2016-06-14 MED ORDER — AMPHETAMINE-DEXTROAMPHETAMINE 30 MG PO TABS: 45.0000 mg | ORAL_TABLET | Freq: Every day | ORAL | 0 refills | Status: DC

## 2016-06-14 MED ORDER — HYDROCODONE-ACETAMINOPHEN 10-325 MG PO TABS: 1.0000 | ORAL_TABLET | Freq: Three times a day (TID) | ORAL | 0 refills | Status: DC | PRN

## 2016-06-14 MED ORDER — AMPHETAMINE-DEXTROAMPHET ER 30 MG PO CP24
60.0000 mg | ORAL_CAPSULE | Freq: Every day | ORAL | 0 refills | Status: DC
Start: 2016-06-14 — End: 2016-07-20

## 2016-06-14 NOTE — Telephone Encounter (Signed)
Patient notified via My Chart. Will sign tomorrow. Meds ordered this encounter  Medications  . amphetamine-dextroamphetamine (ADDERALL XR) 30 MG 24 hr capsule    Sig: Take 2 capsules (60 mg total) by mouth daily.    Dispense:  60 capsule    Refill:  0  . amphetamine-dextroamphetamine (ADDERALL) 30 MG tablet    Sig: Take 1.5 tablets by mouth daily. 4pm    Dispense:  45 tablet    Refill:  0  . zolpidem (AMBIEN) 10 MG tablet    Sig: Take 1 tablet (10 mg total) by mouth at bedtime as needed.    Dispense:  30 tablet    Refill:  0  . HYDROcodone-acetaminophen (NORCO) 10-325 MG tablet    Sig: Take 1-2 tablets by mouth 3 (three) times daily as needed.    Dispense:  90 tablet    Refill:  0

## 2016-06-15 NOTE — Telephone Encounter (Signed)
Up front for pick up 

## 2016-07-03 NOTE — Progress Notes (Signed)
Tawana ScaleZach Yocelyn Brocious D.O. [facility] [address] [place], [place] [address] Phone: [phone] Subjective:     CC: Multiple joint pain f/u   UXL:[id number]:Subjective  Frank PallSamuel J [name] is a 26 y.o. male coming in with complaint of multiple joint pain. Patient has seen many different providers and is still considering a spinal stimulator is secondary to the amount of chronic pain.   Patient states unfortunately continues to have chronic left-sided thoracic spine pain with sometimes radiation down the leg. The intramedullary exercises on a regular basis. We attempted to manipulation  Patient also had laboratory workup. Patient was found to have a low vitamin D 27 but autoimmune labs were unremarkable.   03/27/2016 update- patient has not noticed significant improvement with the vitamin D. States that he only has 1-2 days of improvement with the manipulation.  Patient says that he continues to have pain. Seems to increasing frequency but not in severity of the pain then he has been having. Patient states sometimes it can be so severe that it seems to be unrelenting and stops him from activities as well as sleeping at night.   Patient is also coming in with a new problem. States right foot pain. Was walking [place] significant amount in [place] and states that at the in the long days of having a throbbing sensation in the middle of his foot. Patient denies any numbness. States though that there is a tingling. Denies any weakness. Does not remember any true injury.  May 08 2016 update- patient continues to have cramping pain. We didn't change some different exercises. Patient was also started on gabapentin for some of the relief at nighttime for this as well as patient's neuroma of the right foot. Patient states Regarding his back he seems to be doing relatively well. States and still making some progress. States that it is very slow. Not making significant strides aunt this time. Patient though states that  there is no worsening of symptoms.  07/04/2016 update- patient is having worsening symptoms overall. Patient states that the pain is unrelenting. Patient is here with his mother. Patient states that this is causing some underlying depression as well. States that the pain medications that seems to be helping. Getting this from another provider. Continues the once weekly vitamin D with very minimal benefits. Patient states that sometimes the pain is bad enough that edges missed work or other events.  States that the right foot as well as minorly improved after the injection but not severely. Still having pain of the forefoot at the end of a long day.   previous imaging: December 30 2015 a CT scan of thoracic spine. Done have Scheuermann's disease. Seems to be mostly from T6-T12 and mild arthritic changes with kidney stone formation noted bilaterally.  Current Outpatient Prescriptions:  .  amphetamine-dextroamphetamine (ADDERALL XR) 30 MG 24 hr capsule, Take 2 capsules (60 mg total) by mouth daily., Disp: 60 capsule, Rfl: 0 .  amphetamine-dextroamphetamine (ADDERALL) 30 MG tablet, Take 1.5 tablets by mouth daily. 4pm, Disp: 45 tablet, Rfl: 0 .  Diclofenac Sodium (PENNSAID) 2 % SOLN, Apply 1 pump twice daily as needed., Disp: 112 g, Rfl: 3 .  gabapentin (NEURONTIN) 300 MG capsule, Take 1 capsule (300 mg total) by mouth at bedtime., Disp: 30 capsule, Rfl: 3 .  HYDROcodone-acetaminophen (NORCO) 10-325 MG tablet, Take 1-2 tablets by mouth 3 (three) times daily as needed., Disp: 90 tablet, Rfl: 0 .  Vitamin D, Ergocalciferol, (DRISDOL) 50000 units CAPS capsule,  Take 1 capsule (50,000 Units total) by mouth every 7 (seven) days., Disp: 12 capsule, Rfl: 0 .  zolpidem (AMBIEN) 10 MG tablet, Take 1 tablet (10 mg total) by mouth at bedtime as needed., Disp: 30 tablet, Rfl: 0 .  venlafaxine XR (EFFEXOR XR) 37.5 MG 24 hr capsule, Take 1 capsule (37.5 mg total) by mouth daily with breakfast., Disp: 30 capsule, Rfl:  1  Past Medical History:  Diagnosis Date  . ADD (attention deficit disorder)   . Anxiety    Past Surgical History:  Procedure Laterality Date  . KNEE SURGERY    . WISDOM TOOTH EXTRACTION  2013   Social History   Social History  . Marital status: Single    Spouse name: N/A  . Number of children: N/A  . Years of education: N/A   Social History Main Topics  . Smoking status: Never Smoker  . Smokeless tobacco: Never Used  . Alcohol use 7.2 oz/week    7 Standard drinks or equivalent, 5 Shots of liquor per week  . Drug use: No  . Sexual activity: Yes   Other Topics Concern  . None   Social History Narrative   Caffeine 1-2 cups a week   No Known Allergies Family History  Problem Relation Age of Onset  . Stroke Maternal Grandmother   . Cancer Paternal Grandmother        Breast  . Hypertension Paternal Grandmother   . Diabetes Paternal Grandfather   . Prostate cancer Father        and grandfather    Past medical history, social, surgical and family history all reviewed in electronic medical record.  No pertanent information unless stated regarding to the chief complaint.   Review of Systems: No headache, visual changes, nausea, vomiting, diarrhea, constipation, dizziness, abdominal pain, skin rash, fevers, chills, night sweats, weight loss, swollen lymph nodes, , chest pain, shortness of breath, mood changes.  Positive muscle aches and joint swelling positive body aches  Objective  Blood pressure 126/80, pulse 65, height 6' (1.829 m), weight 233 lb (105.7 kg), SpO2 96 %.   Systems examined below as of 07/04/16 General: NAD A&O x3 mood, affect normal  HEENT: Pupils equal, extraocular movements intact no nystagmus Respiratory: not short of breath at rest or with speaking Cardiovascular: No lower extremity edema, non tender Skin: Warm dry intact with no signs of infection or rash on extremities or on axial skeleton. Abdomen: Soft nontender, no masses Neuro: Cranial  nerves  intact, neurovascularly intact in all extremities with 2+ DTRs and 2+ pulses. Lymph: No lymphadenopathy appreciated today  Gait normal with good balance and coordination.  MSK:  Mild tender with full range of motion and good stability and symmetric strength and tone of shoulders, elbows, wrist, hip, knee and ankles bilaterally. Patient does have hypermobility of multiple joints. Beighton score 7  Exam on the right foot shows patient does have breakdown of the transverse arch and mild rigid midfoot. Patient does have a positive squeeze test. Neurovascularly intact distally. Patient is tender to palpation between the second and third metatarsals.no change from previous exam.    Back Exam:  Inspection: Unremarkable  Motion: Flexion 30 deg, Extension 25 with worsening symptoms  Side Bending to 30 deg bilaterally, Rotation to 35 deg bilaterally  SLR laying: Negative  XSLR laying: Negative  Palpable tenderness: Continued discomfort in the paraspinal musculature lumbar spine. FABER: increasing tightness. . Sensory change: Gross sensation intact to all lumbar and sacral dermatomes.  Reflexes: 2+  at both patellar tendons, 2+ at achilles tendons, Babinski's downgoing.  Strength at foot  Unable to do strengthening  Test due to pain    Osteopathic findings C3 flexed rotated and side bent right C7 flexed rotated and side bent left T3 extended rotated and side bent right inhaled third rib T6 extended rotated and side bent left L3 flexed rotated and side bent right Sacrum right on right        Impression and Recommendations:     This case required medical decision making of moderate complexity.      Note: This dictation was prepared with Dragon dictation along with smaller phrase technology. Any transcriptional errors that result from this process are unintentional.

## 2016-07-04 ENCOUNTER — Ambulatory Visit (INDEPENDENT_AMBULATORY_CARE_PROVIDER_SITE_OTHER): Payer: PRIVATE HEALTH INSURANCE | Admitting: Family Medicine

## 2016-07-04 ENCOUNTER — Encounter: Payer: Self-pay | Admitting: Family Medicine

## 2016-07-04 VITALS — BP 126/80 | HR 65 | Ht 72.0 in | Wt 233.0 lb

## 2016-07-04 DIAGNOSIS — Z8659 Personal history of other mental and behavioral disorders: Secondary | ICD-10-CM

## 2016-07-04 DIAGNOSIS — M4200 Juvenile osteochondrosis of spine, site unspecified: Secondary | ICD-10-CM

## 2016-07-04 DIAGNOSIS — M999 Biomechanical lesion, unspecified: Secondary | ICD-10-CM | POA: Diagnosis not present

## 2016-07-04 MED ORDER — KETOROLAC TROMETHAMINE 60 MG/2ML IM SOLN
60.0000 mg | Freq: Once | INTRAMUSCULAR | Status: AC
  Administered 2016-07-04: 60 mg via INTRAMUSCULAR

## 2016-07-04 MED ORDER — METHYLPREDNISOLONE ACETATE 80 MG/ML IJ SUSP
80.0000 mg | Freq: Once | INTRAMUSCULAR | Status: AC
  Administered 2016-07-04: 80 mg via INTRAMUSCULAR

## 2016-07-04 MED ORDER — VENLAFAXINE HCL ER 37.5 MG PO CP24: 37.5000 mg | ORAL_CAPSULE | Freq: Every day | ORAL | 1 refills | Status: DC

## 2016-07-04 NOTE — Assessment & Plan Note (Signed)
Started on effexor.  Likely may trait.

## 2016-07-04 NOTE — Assessment & Plan Note (Signed)
Decision today to treat with OMT was based on Physical Exam  After verbal consent patient was treated with HVLA, ME, FPR techniques in cervical, thoracic, lumbar and sacral areas  Patient tolerated the procedure well with improvement in symptoms  Patient given exercises, stretches and lifestyle modifications  See medications in patient instructions if given  Patient will follow up in 2-3 weeks 

## 2016-07-04 NOTE — Assessment & Plan Note (Signed)
Stable, no improvement, worsening symptoms though, has responded some OMT  Still though chronic pain, will not prescribe pain meds.  Ice and start effexor for 37.5mg   RTC in 2-3 weeks.

## 2016-07-04 NOTE — Patient Instructions (Signed)
Good to see you  I am here and we will work on it We will get you orthotics Effexor 37.56mg  daily  Keep doing everything else at this time.  I know it is frustrating.  See me again in 2-3 weeks (OK to double book)

## 2016-07-20 ENCOUNTER — Other Ambulatory Visit: Payer: Self-pay | Admitting: Family Medicine

## 2016-07-20 ENCOUNTER — Other Ambulatory Visit: Payer: Self-pay | Admitting: Physician Assistant

## 2016-07-20 DIAGNOSIS — F988 Other specified behavioral and emotional disorders with onset usually occurring in childhood and adolescence: Secondary | ICD-10-CM

## 2016-07-20 DIAGNOSIS — G47419 Narcolepsy without cataplexy: Secondary | ICD-10-CM

## 2016-07-20 DIAGNOSIS — M5134 Other intervertebral disc degeneration, thoracic region: Secondary | ICD-10-CM

## 2016-07-20 DIAGNOSIS — G4700 Insomnia, unspecified: Secondary | ICD-10-CM

## 2016-07-20 MED ORDER — AMPHETAMINE-DEXTROAMPHETAMINE 30 MG PO TABS: 45.0000 mg | ORAL_TABLET | Freq: Every day | ORAL | 0 refills | Status: DC

## 2016-07-20 MED ORDER — HYDROCODONE-ACETAMINOPHEN 10-325 MG PO TABS: 1.0000 | ORAL_TABLET | Freq: Three times a day (TID) | ORAL | 0 refills | Status: DC | PRN

## 2016-07-20 MED ORDER — AMPHETAMINE-DEXTROAMPHET ER 30 MG PO CP24: 60.0000 mg | ORAL_CAPSULE | Freq: Every day | ORAL | 0 refills | Status: DC

## 2016-07-20 MED ORDER — ZOLPIDEM TARTRATE 10 MG PO TABS: 10.0000 mg | ORAL_TABLET | Freq: Every evening | ORAL | 0 refills | Status: DC | PRN

## 2016-07-20 NOTE — Telephone Encounter (Signed)
01/2016 last ov 

## 2016-07-20 NOTE — Telephone Encounter (Signed)
Patient notified via My Chart.  Meds ordered this encounter  Medications  . amphetamine-dextroamphetamine (ADDERALL XR) 30 MG 24 hr capsule    Sig: Take 2 capsules (60 mg total) by mouth daily.    Dispense:  60 capsule    Refill:  0  . amphetamine-dextroamphetamine (ADDERALL) 30 MG tablet    Sig: Take 1.5 tablets by mouth daily. 4pm    Dispense:  45 tablet    Refill:  0  . zolpidem (AMBIEN) 10 MG tablet    Sig: Take 1 tablet (10 mg total) by mouth at bedtime as needed.    Dispense:  30 tablet    Refill:  0  . HYDROcodone-acetaminophen (NORCO) 10-325 MG tablet    Sig: Take 1-2 tablets by mouth 3 (three) times daily as needed.    Dispense:  90 tablet    Refill:  0

## 2016-07-22 MED ORDER — VITAMIN D (ERGOCALCIFEROL) 1.25 MG (50000 UNIT) PO CAPS: 50000.0000 [IU] | ORAL_CAPSULE | ORAL | 0 refills | Status: DC

## 2016-07-25 ENCOUNTER — Encounter: Payer: Self-pay | Admitting: Family Medicine

## 2016-07-25 ENCOUNTER — Ambulatory Visit (INDEPENDENT_AMBULATORY_CARE_PROVIDER_SITE_OTHER): Payer: PRIVATE HEALTH INSURANCE | Admitting: Family Medicine

## 2016-07-25 VITALS — BP 120/80 | HR 72 | Ht 72.0 in | Wt 234.0 lb

## 2016-07-25 DIAGNOSIS — M999 Biomechanical lesion, unspecified: Secondary | ICD-10-CM

## 2016-07-25 DIAGNOSIS — G5761 Lesion of plantar nerve, right lower limb: Secondary | ICD-10-CM

## 2016-07-25 DIAGNOSIS — M4200 Juvenile osteochondrosis of spine, site unspecified: Secondary | ICD-10-CM | POA: Diagnosis not present

## 2016-07-25 MED ORDER — VENLAFAXINE HCL ER 75 MG PO CP24: 75.0000 mg | ORAL_CAPSULE | Freq: Every day | ORAL | 1 refills | Status: DC

## 2016-07-25 NOTE — Progress Notes (Signed)
Tawana ScaleZach Timmey Lamba D.O. [facility] [address] [place], [place] [address] Phone: [phone] Subjective:     CC: Multiple joint pain f/u   GEX:[id number]:Subjective  Frank [name] is a 26 y.o. male coming in with complaint of multiple joint pain. Patient has seen many different providers and is still considering a spinal stimulator is secondary to the amount of chronic pain.   Patient states unfortunately continues to have chronic left-sided thoracic spine pain with sometimes radiation down the leg. The intramedullary exercises on a regular basis. We attempted to manipulation  Patient also had laboratory workup. Patient was found to have a low vitamin D 27 but autoimmune labs were unremarkable.   03/27/2016 update- patient has not noticed significant improvement with the vitamin D. States that he only has 1-2 days of improvement with the manipulation.  Patient says that he continues to have pain. Seems to increasing frequency but not in severity of the pain then he has been having. Patient states sometimes it can be so severe that it seems to be unrelenting and stops him from activities as well as sleeping at night.   Patient is also coming in with a new problem. States right foot pain. Was walking a significant amount in [place] and states that at the in the long days of having a throbbing sensation in the middle of his foot. Patient denies any numbness. States though that there is a tingling. Denies any weakness. Does not remember any true injury.  May 08 2016 update- patient continues to have cramping pain. We didn't change some different exercises. Patient was also started on gabapentin for some of the relief at nighttime for this as well as patient's neuroma of the right foot. Patient states Regarding his back he seems to be doing relatively well. States and still making some progress. States that it is very slow. Not making significant strides aunt this time. Patient though states that  there is no worsening of symptoms.  07/04/2016 update- patient is having worsening symptoms overall. Patient states that the pain is unrelenting. Patient is here with his mother. Patient states that this is causing some underlying depression as well. States that the pain medications that seems to be helping. Getting this from another provider. Continues the once weekly vitamin D with very minimal benefits. Patient states that sometimes the pain is bad enough that edges missed work or other events.  07/25/2016 update-patient continues to be seen for the Scheuermann's disease. We continued to attempt osteopathic manipulation and only gets some very minimal improvement for hours to days. Patient was having more depression with the pain. Patient was to start Effexor. Continue all other conservative therapy. Patient states no improvement yet.  Still having pain. Still the back pain.  No numbness, no radiation.   previous imaging: December 30 2015 a CT scan of thoracic spine. Done have Scheuermann's disease. Seems to be mostly from T6-T12 and mild arthritic changes with kidney stone formation noted bilaterally.  Current Outpatient Prescriptions:  .  amphetamine-dextroamphetamine (ADDERALL XR) 30 MG 24 hr capsule, Take 2 capsules (60 mg total) by mouth daily., Disp: 60 capsule, Rfl: 0 .  amphetamine-dextroamphetamine (ADDERALL) 30 MG tablet, Take 1.5 tablets by mouth daily. 4pm, Disp: 45 tablet, Rfl: 0 .  Diclofenac Sodium (PENNSAID) 2 % SOLN, Apply 1 pump twice daily as needed., Disp: 112 g, Rfl: 3 .  gabapentin (NEURONTIN) 300 MG capsule, Take 1 capsule (300 mg total) by mouth at bedtime., Disp: 30 capsule, Rfl:  3 .  HYDROcodone-acetaminophen (NORCO) 10-325 MG tablet, Take 1-2 tablets by mouth 3 (three) times daily as needed., Disp: 90 tablet, Rfl: 0 .  venlafaxine XR (EFFEXOR-XR) 75 MG 24 hr capsule, Take 1 capsule (75 mg total) by mouth daily with breakfast., Disp: 30 capsule, Rfl: 1 .  Vitamin D,  Ergocalciferol, (DRISDOL) 50000 units CAPS capsule, Take 1 capsule (50,000 Units total) by mouth every 7 (seven) days., Disp: 12 capsule, Rfl: 0 .  zolpidem (AMBIEN) 10 MG tablet, Take 1 tablet (10 mg total) by mouth at bedtime as needed., Disp: 30 tablet, Rfl: 0  Past Medical History:  Diagnosis Date  . ADD (attention deficit disorder)   . Anxiety    Past Surgical History:  Procedure Laterality Date  . KNEE SURGERY    . WISDOM TOOTH EXTRACTION  2013   Social History   Social History  . Marital status: Single    Spouse name: N/A  . Number of children: N/A  . Years of education: N/A   Social History Main Topics  . Smoking status: Never Smoker  . Smokeless tobacco: Never Used  . Alcohol use 7.2 oz/week    7 Standard drinks or equivalent, 5 Shots of liquor per week  . Drug use: No  . Sexual activity: Yes   Other Topics Concern  . None   Social History Narrative   Caffeine 1-2 cups a week   No Known Allergies Family History  Problem Relation Age of Onset  . Stroke Maternal Grandmother   . Cancer Paternal Grandmother        Breast  . Hypertension Paternal Grandmother   . Diabetes Paternal Grandfather   . Prostate cancer Father        and grandfather    Past medical history, social, surgical and family history all reviewed in electronic medical record.  No pertanent information unless stated regarding to the chief complaint.   Review of Systems: No headache, visual changes, nausea, vomiting, diarrhea, constipation, dizziness, abdominal pain, skin rash, fevers, chills, night sweats, weight loss, swollen lymph nodes,chest pain, shortness of breath, mood changes.  Positive muscle aches and body aches.   Objective  Blood pressure 120/80, pulse 72, height 6' (1.829 m), weight 234 lb (106.1 kg), SpO2 97 %.   Systems examined below as of 07/25/16 General: NAD A&O x3 mood, affect normal  HEENT: Pupils equal, extraocular movements intact no nystagmus Respiratory: not short  of breath at rest or with speaking Cardiovascular: No lower extremity edema, non tender Skin: Warm dry intact with no signs of infection or rash on extremities or on axial skeleton. Abdomen: Soft nontender, no masses Neuro: Cranial nerves  intact, neurovascularly intact in all extremities with 2+ DTRs and 2+ pulses. Lymph: No lymphadenopathy appreciated today  Gait normal with good balance and coordination.  MSK:  Mild tender with full range of motion and good stability and symmetric strength and tone of shoulders, elbows, wrist, hip, knee and ankles bilaterally. Patient does have hypermobility of multiple joints. Beighton score 7   Exam on the right foot shows patient does have breakdown of the transverse arch and mild rigid midfoot.     Back Exam:  Inspection: Unremarkable  Motion: Flexion 30 deg, Extension 25  symptoms  Side Bending to 30 deg bilaterally, Rotation to 35 deg bilaterally  SLR laying: Negative  XSLR laying: Negative  Palpable tenderness: TTP diffusely  FABER: Tightness still noted. Sensory change: Gross sensation intact to all lumbar and sacral dermatomes.  Reflexes: 2+  at both patellar tendons, 2+ at achilles tendons, Babinski's downgoing.  Strength at foot  Unable to do strengthening  Test due to pain   Osteopathic findings Cervical C2 flexed rotated and side bent right C4 flexed rotated and side bent left C6 flexed rotated and side bent left T3 extended rotated and side bent left inhaled third rib T9 extended rotated and side bent left L2 flexed rotated and side bent right Sacrum right on right     Impression and Recommendations:     This case required medical decision making of moderate complexity.      Note: This dictation was prepared with Dragon dictation along with smaller phrase technology. Any transcriptional errors that result from this process are unintentional.

## 2016-07-25 NOTE — Assessment & Plan Note (Signed)
Will be set up today for orthotics.  Separate note.

## 2016-07-25 NOTE — Progress Notes (Signed)
Patient was fitted for a : All-around, standard, cushioned, semi-rigid orthotic. The orthotic was heated and afterward the patient was in a seated position and the orthotic molded. The patient was positioned in subtalar neutral position and 10 degrees of ankle dorsiflexion in a non-weight bearing stance. After completion of molding, patient did have orthotic management which included instructions on acclimating to the orthotics, signs of ill fit as well as care for the orthotic.   The blank was ground to a stable position for weight bearing. Size: 11 Base: Carbon fiber Additional Posting and Padding:   The following postings were fitted onto the molded orthotics to help maintain a talar neutral position - Wedge posting for transverse arch:  250/35x2 bilaterally; Silicone posting for longitudinal arch: Medial, bilaterally 270/90x1 The patient ambulated these, and they were very comfortable and supportive.

## 2016-07-25 NOTE — Patient Instructions (Addendum)
I am glad we are making a little progress.  Will be slow and sweet.  Effexor 75mg  daily now.  Keep working on the back and neck! With the orthotics wear them 1 hour today and increase 1 hour a day thereafter See me again in 4 weeks

## 2016-07-25 NOTE — Assessment & Plan Note (Signed)
Stable overall. We discussed icing regimen and home exercises. We discussed which activities to do in which ones to avoid. Patient will increase the Effexor to 75 mg. Follow-up in 4 weeks

## 2016-07-25 NOTE — Assessment & Plan Note (Signed)
Decision today to treat with OMT was based on Physical Exam  After verbal consent patient was treated with HVLA, ME, FPR techniques in cervical, thoracic, lumbar and sacral areas  Patient tolerated the procedure well with improvement in symptoms  Patient given exercises, stretches and lifestyle modifications  See medications in patient instructions if given  Patient will follow up in 4 weeks 

## 2016-07-25 NOTE — Assessment & Plan Note (Signed)
Discussed with patient  Patient will increase dorsally over the course of time. Meniscus any adjustments of any necessary. Follow-up again in 4 weeks

## 2016-08-21 ENCOUNTER — Encounter: Payer: Self-pay | Admitting: Family Medicine

## 2016-08-21 ENCOUNTER — Ambulatory Visit (INDEPENDENT_AMBULATORY_CARE_PROVIDER_SITE_OTHER): Payer: PRIVATE HEALTH INSURANCE | Admitting: Family Medicine

## 2016-08-21 VITALS — BP 122/82 | HR 66 | Ht 72.0 in

## 2016-08-21 DIAGNOSIS — G5761 Lesion of plantar nerve, right lower limb: Secondary | ICD-10-CM

## 2016-08-21 DIAGNOSIS — M999 Biomechanical lesion, unspecified: Secondary | ICD-10-CM

## 2016-08-21 DIAGNOSIS — M4200 Juvenile osteochondrosis of spine, site unspecified: Secondary | ICD-10-CM | POA: Diagnosis not present

## 2016-08-21 MED ORDER — VENLAFAXINE HCL ER 37.5 MG PO CP24: 112.5000 mg | ORAL_CAPSULE | Freq: Every day | ORAL | 1 refills | Status: DC

## 2016-08-21 MED ORDER — TRAZODONE HCL 50 MG PO TABS: 25.0000 mg | ORAL_TABLET | Freq: Every evening | ORAL | 3 refills | Status: DC | PRN

## 2016-08-21 NOTE — Assessment & Plan Note (Addendum)
Patient has chronic back pain as well as the underlying condition. We discussed with patient at great length again. We'll try to increase Effexor to see if that will help with some of the pain medications as well. Having difficulty with sleep study given trazodone. Warned the potential side effects. Does respond somewhat to manipulation. Worsening symptoms we may need to consider further evaluation of gastroenterology arm pulmonary also be contribute in. Does have a kidney stones noted possible treatment of this could be also another 2-3 weeks.

## 2016-08-21 NOTE — Patient Instructions (Addendum)
Good to see you  Frank [name] is your friend.  Effexor 112.5mg  daily  prilosec over the counter 2 times a day for 2 weeks.  Try inhaler daily for next 10 days as well.  If mood changes pelease seek medical attention immediately.  Instead of ambien try trazadone at night Lets see you again in 2-3 weeks.

## 2016-08-21 NOTE — Progress Notes (Signed)
[name] [address] [place], [place] [address] Phone: [phone] Subjective:     CC: Multiple joint pain f/u   BJY:NWGNFAOZHY  Frank [name] is a 26 y.o. male coming in with complaint of multiple joint pain. Patient has seen many different providers and is still considering a spinal stimulator is secondary to the amount of chronic pain.   Patient states unfortunately continues to have chronic left-sided thoracic spine pain with sometimes radiation down the leg. The intramedullary exercises on a regular basis. We attempted to manipulation  Patient also had laboratory workup. Patient was found to have a low vitamin D 27 but autoimmune labs were unremarkable.   03/27/2016 update- patient has not noticed significant improvement with the vitamin D. States that he only has 1-2 days of improvement with the manipulation.  Patient says that he continues to have pain. Seems to increasing frequency but not in severity of the pain then he has been having. Patient states sometimes it can be so severe that it seems to be unrelenting and stops him from activities as well as sleeping at night.   Patient is also coming in with a new problem. States right foot pain. Was walking a significant amount in [place] and states that at the in the long days of having a throbbing sensation in the middle of his foot. Patient denies any numbness. States though that there is a tingling. Denies any weakness. Does not remember any true injury.  May 08 2016 update- patient continues to have cramping pain. We didn't change some different exercises. Patient was also started on gabapentin for some of the relief at nighttime for this as well as patient's neuroma of the right foot. Patient states Regarding his back he seems to be doing relatively well. States and still making some progress. States that it is very slow. Not making significant strides aunt this time. Patient though states that  there is no worsening of symptoms.  07/04/2016 update- patient is having worsening symptoms overall. Patient states that the pain is unrelenting. Patient is here with his mother. Patient states that this is causing some underlying depression as well. States that the pain medications that seems to be helping. Getting this from another provider. Continues the once weekly vitamin D with very minimal benefits. Patient states that sometimes the pain is bad enough that edges missed work or other events.  07/25/2016 update-patient continues to be seen for the Scheuermann's disease. We continued to attempt osteopathic manipulation and only gets some very minimal improvement for hours to days. Patient was having more depression with the pain. Patient was to start Effexor. Continue all other conservative therapy. Patient states no improvement yet.  Still having pain. Still the back pain.  No numbness, no radiation.   08/21/2016 update-patient states that the pain continues. Patient states on his toes intermittent and can be with or without activity. Still on the left sign. Can be severe that stops him from activity. Causing worsening depression as well. Started on Effexor. Has helped him with increasing activity during the day because of increasing energy but otherwise that it seems to have done so far, and patient is concerned because nothing seems to be helping.. Foot pain maybe a little better with the orthotics.   previous imaging: December 30 2015 a CT scan of thoracic spine. Done have Scheuermann's disease. Seems to be mostly from T6-T12 and mild arthritic changes with kidney stone formation noted bilaterally.  Current Outpatient Prescriptions:  .  amphetamine-dextroamphetamine (ADDERALL XR) 30 MG 24 hr capsule, Take 2 capsules (60 mg total) by mouth daily., Disp: 60 capsule, Rfl: 0 .  amphetamine-dextroamphetamine (ADDERALL) 30 MG tablet, Take 1.5 tablets by mouth daily. 4pm, Disp: 45 tablet, Rfl: 0 .   Diclofenac Sodium (PENNSAID) 2 % SOLN, Apply 1 pump twice daily as needed., Disp: 112 g, Rfl: 3 .  gabapentin (NEURONTIN) 300 MG capsule, Take 1 capsule (300 mg total) by mouth at bedtime., Disp: 30 capsule, Rfl: 3 .  HYDROcodone-acetaminophen (NORCO) 10-325 MG tablet, Take 1-2 tablets by mouth 3 (three) times daily as needed., Disp: 90 tablet, Rfl: 0 .  venlafaxine XR (EFFEXOR-XR) 37.5 MG 24 hr capsule, Take 3 capsules (112.5 mg total) by mouth daily with breakfast., Disp: 90 capsule, Rfl: 1 .  Vitamin D, Ergocalciferol, (DRISDOL) 50000 units CAPS capsule, Take 1 capsule (50,000 Units total) by mouth every 7 (seven) days., Disp: 12 capsule, Rfl: 0 .  zolpidem (AMBIEN) 10 MG tablet, Take 1 tablet (10 mg total) by mouth at bedtime as needed., Disp: 30 tablet, Rfl: 0 .  traZODone (DESYREL) 50 MG tablet, Take 0.5-1 tablets (25-50 mg total) by mouth at bedtime as needed for sleep., Disp: 30 tablet, Rfl: 3  Past Medical History:  Diagnosis Date  . ADD (attention deficit disorder)   . Anxiety    Past Surgical History:  Procedure Laterality Date  . KNEE SURGERY    . WISDOM TOOTH EXTRACTION  2013   Social History   Social History  . Marital status: Single    Spouse name: N/A  . Number of children: N/A  . Years of education: N/A   Social History Main Topics  . Smoking status: Never Smoker  . Smokeless tobacco: Never Used  . Alcohol use 7.2 oz/week    7 Standard drinks or equivalent, 5 Shots of liquor per week  . Drug use: No  . Sexual activity: Yes   Other Topics Concern  . None   Social History Narrative   Caffeine 1-2 cups a week   No Known Allergies Family History  Problem Relation Age of Onset  . Stroke Maternal Grandmother   . Cancer Paternal Grandmother        Breast  . Hypertension Paternal Grandmother   . Diabetes Paternal Grandfather   . Prostate cancer Father        and grandfather    Past medical history, social, surgical and family history all reviewed in  electronic medical record.  No pertanent information unless stated regarding to the chief complaint.   Review of Systems: No headache, visual changes, nausea, vomiting, diarrhea, constipation, dizziness, abdominal pain, skin rash, fevers, chills, night sweats, weight loss, swollen lymph nodes, chest pain, shortness of breath, mood changes.  Positive muscle aches + body aches.   Objective  Blood pressure 122/82, pulse 66, height 6' (1.829 m), SpO2 97 %.   Systems examined below as of 08/21/16 General: NAD A&O x3 mood, affect normal  HEENT: Pupils equal, extraocular movements intact no nystagmus Respiratory: not short of breath at rest or with speaking Cardiovascular: No lower extremity edema, non tender Skin: Warm dry intact with no signs of infection or rash on extremities or on axial skeleton. Abdomen: Soft nontender, no masses Neuro: Cranial nerves  intact, neurovascularly intact in all extremities with 2+ DTRs and 2+ pulses. Lymph: No lymphadenopathy appreciated today  Gait normal with good balance and coordination.  MSK: Non tender with full range of motion and good stability and symmetric strength  and tone of shoulders, elbows, wrist,  knee hips and ankles bilaterally.  . Patient does have hypermobility of multiple joints. Beighton score 7   Exam on the right foot shows patient does have breakdown of the transverse arch and mild rigid midfoot.     Back Exam:  Inspection: Unremarkable  Motion: Flexion 35 deg, Extension 25 deg, Side Bending to 25 deg bilaterally,  Rotation to 25 deg bilaterally  SLR laying: Negative  XSLR laying: Negative  Palpable tenderness: Tender to palpation in the paraspinal musculature of the lumbar spine left greater then right. Worse at the thoracolumbar juncture. FABER: negative. Sensory change: Gross sensation intact to all lumbar and sacral dermatomes.  Reflexes: 2+ at both patellar tendons, 2+ at achilles tendons, Babinski's downgoing.  Strength at  foot  Plantar-flexion: 5/5 Dorsi-flexion: 5/5 Eversion: 5/5 Inversion: 5/5  Leg strength  Quad: 5/5 Hamstring: 5/5 Hip flexor: 5/5 Hip abductors: 5/5  Gait unremarkable.  Osteopathic findings C2 flexed rotated and side bent right C4 flexed rotated and side bent left C7 flexed rotated and side bent left T3 extended rotated and side bent right inhaled third rib T6 extended rotated and side bent left L4 flexed rotated and side bent right Sacrum right on right     Impression and Recommendations:     This case required medical decision making of moderate complexity.      Note: This dictation was prepared with Dragon dictation along with smaller phrase technology. Any transcriptional errors that result from this process are unintentional.

## 2016-08-21 NOTE — Assessment & Plan Note (Signed)
Decision today to treat with OMT was based on Physical Exam  After verbal consent patient was treated with HVLA, ME, FPR techniques in cervical, thoracic, lumbar and sacral areas  Patient tolerated the procedure well with improvement in symptoms  Patient given exercises, stretches and lifestyle modifications  See medications in patient instructions if given  Patient will follow up in 2-3 weeks 

## 2016-08-21 NOTE — Assessment & Plan Note (Signed)
Stable, monitor may need to try another injection.

## 2016-08-22 ENCOUNTER — Telehealth: Payer: Self-pay | Admitting: Physician Assistant

## 2016-08-22 ENCOUNTER — Other Ambulatory Visit: Payer: Self-pay | Admitting: *Deleted

## 2016-08-22 DIAGNOSIS — G47429 Narcolepsy in conditions classified elsewhere without cataplexy: Secondary | ICD-10-CM

## 2016-08-22 DIAGNOSIS — G47419 Narcolepsy without cataplexy: Secondary | ICD-10-CM

## 2016-08-22 DIAGNOSIS — F988 Other specified behavioral and emotional disorders with onset usually occurring in childhood and adolescence: Secondary | ICD-10-CM

## 2016-08-22 DIAGNOSIS — G4700 Insomnia, unspecified: Secondary | ICD-10-CM

## 2016-08-22 DIAGNOSIS — M5134 Other intervertebral disc degeneration, thoracic region: Secondary | ICD-10-CM

## 2016-08-22 MED ORDER — ALBUTEROL SULFATE HFA 108 (90 BASE) MCG/ACT IN AERS: 2.0000 | INHALATION_SPRAY | RESPIRATORY_TRACT | 0 refills | Status: DC | PRN

## 2016-08-22 NOTE — Telephone Encounter (Signed)
Pt  was supposed to receive an inhaler yesterday and never did , please call back in regard

## 2016-08-22 NOTE — Telephone Encounter (Signed)
Resent rx into correct pharmacy. 

## 2016-08-22 NOTE — Telephone Encounter (Signed)
Left msg making pt aware rx has been sent into pharmacy.

## 2016-08-23 ENCOUNTER — Telehealth: Payer: Self-pay

## 2016-08-23 MED ORDER — AMPHETAMINE-DEXTROAMPHET ER 30 MG PO CP24: 60.0000 mg | ORAL_CAPSULE | Freq: Every day | ORAL | 0 refills | Status: DC

## 2016-08-23 MED ORDER — AMPHETAMINE-DEXTROAMPHETAMINE 30 MG PO TABS: 45.0000 mg | ORAL_TABLET | Freq: Every day | ORAL | 0 refills | Status: DC

## 2016-08-23 MED ORDER — ZOLPIDEM TARTRATE 10 MG PO TABS: 10.0000 mg | ORAL_TABLET | Freq: Every evening | ORAL | 0 refills | Status: DC | PRN

## 2016-08-23 MED ORDER — HYDROCODONE-ACETAMINOPHEN 10-325 MG PO TABS: 1.0000 | ORAL_TABLET | Freq: Three times a day (TID) | ORAL | 0 refills | Status: DC | PRN

## 2016-08-23 NOTE — Telephone Encounter (Signed)
Informed PT that he has 4 Rx (Adderall x2, Ambein, and NORCO) ready for pickup at the [facility].

## 2016-08-23 NOTE — Telephone Encounter (Signed)
Patient notified via My Chart. Needs OV for more. Second notice.  Meds ordered this encounter  Medications  . amphetamine-dextroamphetamine (ADDERALL XR) 30 MG 24 hr capsule    Sig: Take 2 capsules (60 mg total) by mouth daily.    Dispense:  60 capsule    Refill:  0  . amphetamine-dextroamphetamine (ADDERALL) 30 MG tablet    Sig: Take 1.5 tablets by mouth daily. 4pm    Dispense:  45 tablet    Refill:  0  . zolpidem (AMBIEN) 10 MG tablet    Sig: Take 1 tablet (10 mg total) by mouth at bedtime as needed.    Dispense:  30 tablet    Refill:  0  . HYDROcodone-acetaminophen (NORCO) 10-325 MG tablet    Sig: Take 1-2 tablets by mouth 3 (three) times daily as needed.    Dispense:  90 tablet    Refill:  0

## 2016-09-06 ENCOUNTER — Telehealth: Payer: Self-pay | Admitting: Physician Assistant

## 2016-09-06 NOTE — Telephone Encounter (Signed)
Please CALL this patient. He has not read the my chart messages notifying him that he will need to schedule a visit with me before I can refill his controlled substances again.  I'm looking at the dates, and I think he's going to run out while I am out of the office next month. So, I'd be willing to print one more set before I leave, but ONLY if he schedules a visit with me.

## 2016-09-07 ENCOUNTER — Telehealth: Payer: Self-pay

## 2016-09-07 ENCOUNTER — Telehealth: Payer: Self-pay | Admitting: Family Medicine

## 2016-09-07 MED ORDER — AMPHETAMINE-DEXTROAMPHET ER 30 MG PO CP24: 60.0000 mg | ORAL_CAPSULE | Freq: Every day | ORAL | 0 refills | Status: DC

## 2016-09-07 MED ORDER — AMPHETAMINE-DEXTROAMPHETAMINE 30 MG PO TABS: 45.0000 mg | ORAL_TABLET | Freq: Every day | ORAL | 0 refills | Status: DC

## 2016-09-07 MED ORDER — ZOLPIDEM TARTRATE 10 MG PO TABS: 10.0000 mg | ORAL_TABLET | Freq: Every evening | ORAL | 0 refills | Status: DC | PRN

## 2016-09-07 MED ORDER — HYDROCODONE-ACETAMINOPHEN 10-325 MG PO TABS: 1.0000 | ORAL_TABLET | Freq: Three times a day (TID) | ORAL | 0 refills | Status: DC | PRN

## 2016-09-07 NOTE — Telephone Encounter (Signed)
lmom for pt to call an make an OV with [name] for more refills on controlled drugs third notice 

## 2016-09-07 NOTE — Telephone Encounter (Signed)
lmom for pt to call an make an OV with [name] for more refills on controlled drugs third notice

## 2016-09-07 NOTE — Telephone Encounter (Signed)
Frank [name] would like one more refill on medicines before his appointment on Aug 10-13-16 he states that he lives out of town right now but maybe moving back cause of job change he also states that he has been going to physical therapy daily if you need to speak with [name] he said that you can call him anytime 

## 2016-09-07 NOTE — Telephone Encounter (Signed)
Another set of Rx's written, to be filled on/around 8/11. Printed now, as I will be out of the office when they are due for fill. Patient to see me as planned on 10/13/2016.  Patient notified via My Chart.

## 2016-09-07 NOTE — Telephone Encounter (Signed)
Called Pt and left message stating he needs an appointment before refills can be done. Also stated that [name] will be out of the office at the start of the month and he needs to check his Mychart messages.

## 2016-09-07 NOTE — Telephone Encounter (Signed)
Scripts are in the [facility] box.

## 2016-09-07 NOTE — Telephone Encounter (Signed)
Pt has been notifed via Mychart that his Rx are ready for pickup.

## 2016-09-07 NOTE — Telephone Encounter (Signed)
[name] would like one more refill on medicines before his appointment on Aug 10-13-16 he states that he lives out of town right now but maybe moving back cause of job change he also states that he has been going to physical therapy daily if you need to speak with pt he said that you can call him anytime 

## 2016-09-07 NOTE — Telephone Encounter (Signed)
Pt spoke with Angie and made an appointment for 10/13/2016.

## 2016-09-07 NOTE — Telephone Encounter (Signed)
[name] would like one more refill on medicines before his appointment on Aug 10-13-16 he states that he lives out of town right now but maybe moving back cause of job change he also states that he has been going to physical therapy daily if you need to speak with pt he said that you can call him anytime

## 2016-09-07 NOTE — Telephone Encounter (Signed)
Called Pt and left message stating he needs an appointment before refills can be done. Also stated that [name] will be out of the office at the start of the month and he needs to check his Mychart messages. 

## 2016-09-11 ENCOUNTER — Ambulatory Visit (INDEPENDENT_AMBULATORY_CARE_PROVIDER_SITE_OTHER): Payer: PRIVATE HEALTH INSURANCE | Admitting: Family Medicine

## 2016-09-11 ENCOUNTER — Other Ambulatory Visit (INDEPENDENT_AMBULATORY_CARE_PROVIDER_SITE_OTHER): Payer: PRIVATE HEALTH INSURANCE

## 2016-09-11 ENCOUNTER — Encounter: Payer: Self-pay | Admitting: Family Medicine

## 2016-09-11 VITALS — BP 118/72 | HR 69 | Ht 72.0 in | Wt 232.0 lb

## 2016-09-11 DIAGNOSIS — M4200 Juvenile osteochondrosis of spine, site unspecified: Secondary | ICD-10-CM | POA: Diagnosis not present

## 2016-09-11 DIAGNOSIS — M999 Biomechanical lesion, unspecified: Secondary | ICD-10-CM

## 2016-09-11 DIAGNOSIS — M2550 Pain in unspecified joint: Secondary | ICD-10-CM

## 2016-09-11 DIAGNOSIS — G5761 Lesion of plantar nerve, right lower limb: Secondary | ICD-10-CM | POA: Diagnosis not present

## 2016-09-11 LAB — COMPREHENSIVE METABOLIC PANEL
ALK PHOS: 46 U/L (ref 39–117)
ALT: 49 U/L (ref 0–53)
AST: 25 U/L (ref 0–37)
Albumin: 4.6 g/dL (ref 3.5–5.2)
BILIRUBIN TOTAL: 1.3 mg/dL — AB (ref 0.2–1.2)
BUN: 22 mg/dL (ref 6–23)
CO2: 25 mEq/L (ref 19–32)
Calcium: 9.4 mg/dL (ref 8.4–10.5)
Chloride: 106 mEq/L (ref 96–112)
Creatinine, Ser: 0.79 mg/dL (ref 0.40–1.50)
GFR: 126.61 mL/min (ref >60.00)
Glucose, Bld: 108 mg/dL — ABNORMAL HIGH (ref 70–99)
POTASSIUM: 3.9 meq/L (ref 3.5–5.1)
SODIUM: 139 meq/L (ref 135–145)
TOTAL PROTEIN: 7.5 g/dL (ref 6.0–8.3)

## 2016-09-11 LAB — CBC WITH DIFFERENTIAL/PLATELET
Basophils Absolute: 0 10*3/uL (ref 0.0–0.1)
Basophils Relative: 0.5 % (ref 0.0–3.0)
EOS PCT: 2.3 % (ref 0.0–5.0)
Eosinophils Absolute: 0.1 10*3/uL (ref 0.0–0.7)
HCT: 44.7 % (ref 39.0–52.0)
Hemoglobin: 15.8 g/dL (ref 13.0–17.0)
Lymphocytes Relative: 26.5 % (ref 12.0–46.0)
Lymphs Abs: 1.5 10*3/uL (ref 0.7–4.0)
MCHC: 35.4 g/dL (ref 30.0–36.0)
MCV: 90.3 fl (ref 78.0–100.0)
MONO ABS: 0.4 10*3/uL (ref 0.1–1.0)
MONOS PCT: 7.2 % (ref 3.0–12.0)
Neutro Abs: 3.5 10*3/uL (ref 1.4–7.7)
Neutrophils Relative %: 63.5 % (ref 43.0–77.0)
Platelets: 163 10*3/uL (ref 150.0–400.0)
RBC: 4.95 Mil/uL (ref 4.22–5.81)
RDW: 13 % (ref 11.5–15.5)
WBC: 5.6 10*3/uL (ref 4.0–10.5)

## 2016-09-11 LAB — VITAMIN D 25 HYDROXY (VIT D DEFICIENCY, FRACTURES): VITD: 36.16 ng/mL (ref 30.00–100.00)

## 2016-09-11 LAB — IBC PANEL
Iron: 167 ug/dL — ABNORMAL HIGH (ref 42–165)
SATURATION RATIOS: 45.2 % (ref 20.0–50.0)
Transferrin: 264 mg/dL (ref 212.0–360.0)

## 2016-09-11 LAB — T4, FREE: FREE T4: 0.81 ng/dL (ref 0.60–1.60)

## 2016-09-11 LAB — T3, FREE: T3 FREE: 2.8 pg/mL (ref 2.3–4.2)

## 2016-09-11 LAB — TSH: TSH: 1.09 u[IU]/mL (ref 0.35–4.50)

## 2016-09-11 LAB — C-REACTIVE PROTEIN: CRP: 0.1 mg/dL — AB (ref 0.5–20.0)

## 2016-09-11 LAB — SEDIMENTATION RATE: Sed Rate: 6 mm/hr (ref 0–15)

## 2016-09-11 NOTE — Assessment & Plan Note (Signed)
Patient has has been attempting to conservative therapy with very minimal benefit. I do not think that this is contributing to most of his discomfort. I do think that there is some other comorbidities and also contributing including patient's anxiety and potentially underlying depression. Patient will be discussing with his primary care provider. We'll do an adjustment with Effexor of 4 pills daily to see how he does 150 mg. We discussed icing regimen. Discussed staying active. Laboratory workup for polyarthralgia. Follow-up again in 4 weeks

## 2016-09-11 NOTE — Progress Notes (Signed)
[facility] [address] [place], [place] [address] Phone: [phone] Subjective:     CC: Multiple joint pain f/u   BJY:NWGNFAOZHYHPI:Subjective  Anastasia PallSamuel J Spychalski is a 26 y.o. male coming in with complaint of multiple joint pain. Patient has seen many different providers and is still considering a spinal stimulator is secondary to the amount of chronic pain.   Patient states unfortunately continues to have chronic left-sided thoracic spine pain with sometimes radiation down the leg. The intramedullary exercises on a regular basis. We attempted to manipulation  Patient also had laboratory workup. Patient was found to have a low vitamin D 27 but autoimmune labs were unremarkable.   03/27/2016 update- patient has not noticed significant improvement with the vitamin D. States that he only has 1-2 days of improvement with the manipulation.  Patient says that he continues to have pain. Seems to increasing frequency but not in severity of the pain then he has been having. Patient states sometimes it can be so severe that it seems to be unrelenting and stops him from activities as well as sleeping at night.   Patient is also coming in with a new problem. States right foot pain. [place] and states that at the in the long days of having a throbbing sensation in the middle of his foot. Patient denies any numbness. States though that there is a tingling. Denies any weakness. Does not remember any true injury.  May 08 2016 update- patient continues to have cramping pain. We didn't change some different exercises. Patient was also started on gabapentin for some of the relief at nighttime for this as well as patient's neuroma of the right foot. Patient states Regarding his back he seems to be doing relatively well. States and still making some progress. States that it is very slow. Not making significant strides aunt this time. Patient though states that  there is no worsening of symptoms.  07/04/2016 update- patient is having worsening symptoms overall. Patient states that the pain is unrelenting. Patient is here with his mother. Patient states that this is causing some underlying depression as well. States that the pain medications that seems to be helping. Getting this from another provider. Continues the once weekly vitamin D with very minimal benefits. Patient states that sometimes the pain is bad enough that edges missed work or other events.  07/25/2016 update-patient continues to be seen for the Scheuermann's disease. We continued to attempt osteopathic manipulation and only gets some very minimal improvement for hours to days. Patient was having more depression with the pain. Patient was to start Effexor. Continue all other conservative therapy. Patient states no improvement yet.  Still having pain. Still the back pain.  No numbness, no radiation.   08/21/2016 update-patient states that the pain continues. Patient states on his toes intermittent and can be with or without activity. Still on the left sign. Can be severe that stops him from activity. Causing worsening depression as well. Started on Effexor. Has helped him with increasing activity during the day because of increasing energy but otherwise that it seems to have done so far, and patient is concerned because nothing seems to be helping.. Foot pain maybe a little better with the orthotics.    July 30th 2018-increased Effexor. Doing 3 pills M.D. some Milligrams. Patient states has not noticed improvement. Continues to have severe amount of pain on the lateral aspect of the lumbar spine. Since then he  will have a pop it causes severe pain and then seems to help it. Biopsies back multiple times throughout the day. Does get benefit from a manipulation states it's only short-term.  previous imaging: December 30 2015 a CT scan of thoracic spine. Done have Scheuermann's disease. Seems to be  mostly from T6-T12 and mild arthritic changes with kidney stone formation noted bilaterally.  Current Outpatient Prescriptions:  .  albuterol (PROVENTIL HFA;VENTOLIN HFA) 108 (90 Base) MCG/ACT inhaler, Inhale 2 puffs into the lungs as needed for wheezing or shortness of breath., Disp: 1 Inhaler, Rfl: 0 .  amphetamine-dextroamphetamine (ADDERALL XR) 30 MG 24 hr capsule, Take 2 capsules (60 mg total) by mouth daily., Disp: 60 capsule, Rfl: 0 .  amphetamine-dextroamphetamine (ADDERALL) 30 MG tablet, Take 1.5 tablets by mouth daily. 4pm, Disp: 45 tablet, Rfl: 0 .  Diclofenac Sodium (PENNSAID) 2 % SOLN, Apply 1 pump twice daily as needed., Disp: 112 g, Rfl: 3 .  HYDROcodone-acetaminophen (NORCO) 10-325 MG tablet, Take 1-2 tablets by mouth 3 (three) times daily as needed., Disp: 90 tablet, Rfl: 0 .  traZODone (DESYREL) 50 MG tablet, Take 0.5-1 tablets (25-50 mg total) by mouth at bedtime as needed for sleep., Disp: 30 tablet, Rfl: 3 .  venlafaxine XR (EFFEXOR-XR) 37.5 MG 24 hr capsule, Take 3 capsules (112.5 mg total) by mouth daily with breakfast., Disp: 90 capsule, Rfl: 1 .  Vitamin D, Ergocalciferol, (DRISDOL) 50000 units CAPS capsule, Take 1 capsule (50,000 Units total) by mouth every 7 (seven) days., Disp: 12 capsule, Rfl: 0 .  zolpidem (AMBIEN) 10 MG tablet, Take 1 tablet (10 mg total) by mouth at bedtime as needed., Disp: 30 tablet, Rfl: 0  Past Medical History:  Diagnosis Date  . ADD (attention deficit disorder)   . Anxiety    Past Surgical History:  Procedure Laterality Date  . KNEE SURGERY    . WISDOM TOOTH EXTRACTION  2013   Social History   Social History  . Marital status: Single    Spouse name: N/A  . Number of children: N/A  . Years of education: N/A   Social History Main Topics  . Smoking status: Never Smoker  . Smokeless tobacco: Never Used  . Alcohol use 7.2 oz/week    7 Standard drinks or equivalent, 5 Shots of liquor per week  . Drug use: No  . Sexual activity: Yes     Other Topics Concern  . None   Social History Narrative   Caffeine 1-2 cups a week   No Known Allergies Family History  Problem Relation Age of Onset  . Stroke Maternal Grandmother   . Cancer Paternal Grandmother        Breast  . Hypertension Paternal Grandmother   . Diabetes Paternal Grandfather   . Prostate cancer Father        and grandfather    Past medical history, social, surgical and family history all reviewed in electronic medical record.  No pertanent information unless stated regarding to the chief complaint.   Review of Systems: No headache, visual changes, nausea, vomiting, diarrhea, constipation, dizziness, abdominal pain, skin rash, fevers, chills, night sweats, weight loss, swollen lymph nodes,  chest pain, shortness of breath,  Positive mood changes, muscle aches and body aches  Objective  Blood pressure 118/72, pulse 69, height 6' (1.829 m), weight 232 lb (105.2 kg), SpO2 93 %.   Systems examined below as of 09/11/16 General: NAD A&O x3 mood, affect normal  HEENT: Pupils equal, extraocular movements intact no  nystagmus Respiratory: not short of breath at rest or with speaking Cardiovascular: No lower extremity edema, non tender Skin: Warm dry intact with no signs of infection or rash on extremities or on axial skeleton. Abdomen: Soft nontender, no masses Neuro: Cranial nerves  intact, neurovascularly intact in all extremities with 2+ DTRs and 2+ pulses. Lymph: No lymphadenopathy appreciated today  Gait normal with good balance and coordination.  MSK: Non tender with full range of motion and good stability and symmetric strength and tone of shoulders, elbows, wrist,  knee hips and ankles bilaterally.  Pain to palpation is out of proportion to the amount of pressure      Back Exam:  Inspection: Unremarkable  Motion: Flexion 35 deg, Extension 25 deg, Side Bending to 35 deg bilaterally,  Rotation to 35 deg bilaterally  SLR laying: Negative  XSLR laying:  Negative  Palpable tenderness: Tender to palpation in the paraspinal musculature lumbar spine. Mostly on the left side FABER: negative. Sensory change: Gross sensation intact to all lumbar and sacral dermatomes.  Reflexes: 2+ at both patellar tendons, 2+ at achilles tendons, Babinski's downgoing.  Strength at foot  Plantar-flexion: 5/5 Dorsi-flexion: 5/5 Eversion: 5/5 Inversion: 5/5  Leg strength  Quad: 5/5 Hamstring: 5/5 Hip flexor: 5/5 Hip abductors: 4/5 and symmetric  Gait unremarkable.   Osteopathic findings C2 flexed rotated and side bent right C4 flexed rotated and side bent left C6 flexed rotated and side bent left T3 extended rotated and side bent right inhaled third rib T7 extended rotated and side bent right  T9 extended rotated and side bent left T11 extended rotated and side bent left L2 flexed rotated and side bent left Sacrum right on right      Impression and Recommendations:     This case required medical decision making of moderate complexity.      Note: This dictation was prepared with Dragon dictation along with smaller phrase technology. Any transcriptional errors that result from this process are unintentional.

## 2016-09-11 NOTE — Assessment & Plan Note (Signed)
Decision today to treat with OMT was based on Physical Exam  After verbal consent patient was treated with HVLA, ME, FPR techniques in cervical, thoracic, lumbar and sacral areas  Patient tolerated the procedure well with improvement in symptoms  Patient given exercises, stretches and lifestyle modifications  See medications in patient instructions if given  Patient will follow up in 4 weeks 

## 2016-09-11 NOTE — Patient Instructions (Signed)
Good to see you  We will get labs and check for a whole bunch of stuff Keep it up  Effexor now 150 mg daily send me a message in 1 week Trazadone at night See me again in 3 weeks.

## 2016-09-12 LAB — CK TOTAL AND CKMB (NOT AT ARMC)
CK, MB: 0.7 ng/mL (ref 0.0–5.0)
Relative Index: 0.4 (ref 0.0–4.0)
Total CK: 184 U/L (ref 44–196)

## 2016-09-12 LAB — CALCIUM, IONIZED: Calcium, Ion: 5.3 mg/dL (ref 4.8–5.6)

## 2016-09-12 LAB — PTH, INTACT AND CALCIUM
Calcium: 9.2 mg/dL (ref 8.6–10.3)
PTH: 31 pg/mL (ref 14–64)

## 2016-09-14 LAB — TESTOSTERONE TOTAL,FREE,BIO, MALES
ALBUMIN: 4.6 g/dL (ref 3.6–5.1)
SEX HORMONE BINDING: 20 nmol/L (ref 10–50)
TESTOSTERONE: 225 ng/dL — AB (ref 250–827)

## 2016-09-14 NOTE — Progress Notes (Unsigned)
test

## 2016-09-19 ENCOUNTER — Telehealth: Payer: Self-pay | Admitting: Physician Assistant

## 2016-09-19 ENCOUNTER — Telehealth: Payer: Self-pay | Admitting: Family Medicine

## 2016-09-19 ENCOUNTER — Ambulatory Visit: Payer: 59 | Admitting: Physician Assistant

## 2016-09-19 DIAGNOSIS — M999 Biomechanical lesion, unspecified: Secondary | ICD-10-CM

## 2016-09-19 NOTE — Telephone Encounter (Signed)
Pt called to let Dr Katrinka BlazingSmith know that the increase of Venlafaxine is going well. He has not had any side effects and would like to go ahead and continue the increased dose. He would like to know if this can be sent over to his pharmacy along with a refill on Trazadone ([facility] [address] [place]). He also mentioned that he would like to have the scans that Dr Katrinka BlazingSmith was wanting him to have done entered as soon as possible. He changed jobs and will have his current insurance for another 2 weeks. Please advise.

## 2016-09-19 NOTE — Telephone Encounter (Signed)
error 

## 2016-09-20 MED ORDER — VENLAFAXINE HCL ER 150 MG PO CP24: 150.0000 mg | ORAL_CAPSULE | Freq: Every day | ORAL | 1 refills | Status: DC

## 2016-09-20 MED ORDER — TRAZODONE HCL 50 MG PO TABS: 25.0000 mg | ORAL_TABLET | Freq: Every evening | ORAL | 0 refills | Status: DC | PRN

## 2016-09-20 NOTE — Telephone Encounter (Signed)
Discussed with pt

## 2016-09-20 NOTE — Telephone Encounter (Signed)
It would be MRI of lumbar and thoracic, if improving don't know how much use.  COudl try if hew would like.

## 2016-09-20 NOTE — Telephone Encounter (Signed)
lmovm for pt to return call.  

## 2016-09-22 ENCOUNTER — Ambulatory Visit
Admission: RE | Admit: 2016-09-22 | Discharge: 2016-09-22 | Disposition: A | Discharge status: Home / Self Care | Payer: 59 | Source: Ambulatory Visit | Attending: Family Medicine | Admitting: Family Medicine

## 2016-09-22 DIAGNOSIS — M999 Biomechanical lesion, unspecified: Secondary | ICD-10-CM

## 2016-09-29 ENCOUNTER — Ambulatory Visit (INDEPENDENT_AMBULATORY_CARE_PROVIDER_SITE_OTHER): Payer: 59 | Admitting: Family Medicine

## 2016-09-29 ENCOUNTER — Encounter: Payer: Self-pay | Admitting: Family Medicine

## 2016-09-29 VITALS — BP 120/86 | HR 84 | Ht 72.0 in | Wt 248.0 lb

## 2016-09-29 DIAGNOSIS — R635 Abnormal weight gain: Secondary | ICD-10-CM

## 2016-09-29 DIAGNOSIS — M4200 Juvenile osteochondrosis of spine, site unspecified: Secondary | ICD-10-CM

## 2016-09-29 DIAGNOSIS — R7989 Other specified abnormal findings of blood chemistry: Secondary | ICD-10-CM | POA: Insufficient documentation

## 2016-09-29 DIAGNOSIS — M999 Biomechanical lesion, unspecified: Secondary | ICD-10-CM

## 2016-09-29 DIAGNOSIS — E663 Overweight: Secondary | ICD-10-CM

## 2016-09-29 NOTE — Assessment & Plan Note (Signed)
Continues to have pain. Discussed icing regimen, home exercises icing regimen. We discussed core strengthening instability. Posture. Patient will come and see me again in 2-3 weeks.

## 2016-09-29 NOTE — Progress Notes (Signed)
Tawana ScaleZach Smith D.O.  Sports Medicine [address] [place], [place] [address] Phone: [phone] Subjective:     CC: Multiple joint pain f/u   BJY:NWGNFAOZHYHPI:Subjective  Frank PallSamuel J Spychalski is a 26 y.o. male coming in with complaint of multiple joint pain. Patient has seen many different providers and is still considering a spinal stimulator is secondary to the amount of chronic pain.   Patient states unfortunately continues to have chronic left-sided thoracic spine pain with sometimes radiation down the leg. The intramedullary exercises on a regular basis. We attempted to manipulation  Patient also had laboratory workup. Patient was found to have a low vitamin D 27 but autoimmune labs were unremarkable.   03/27/2016 update- patient has not noticed significant improvement with the vitamin D. States that he only has 1-2 days of improvement with the manipulation.  Patient says that he continues to have pain. Seems to increasing frequency but not in severity of the pain then he has been having. Patient states sometimes it can be so severe that it seems to be unrelenting and stops him from activities as well as sleeping at night.   Patient is also coming in with a new problem. States right foot pain. Was walking a significant amount in [place] and states that at the in the long days of having a throbbing sensation in the middle of his foot. Patient denies any numbness. States though that there is a tingling. Denies any weakness. Does not remember any true injury.  May 08 2016 update- patient continues to have cramping pain. We didn't change some different exercises. Patient was also started on gabapentin for some of the relief at nighttime for this as well as patient's neuroma of the right foot. Patient states Regarding his back he seems to be doing relatively well. States and still making some progress. States that it is very slow. Not making significant strides aunt this time. Patient though states that  there is no worsening of symptoms.  07/04/2016 update- patient is having worsening symptoms overall. Patient states that the pain is unrelenting. Patient is here with his mother. Patient states that this is causing some underlying depression as well. States that the pain medications that seems to be helping. Getting this from another provider. Continues the once weekly vitamin D with very minimal benefits. Patient states that sometimes the pain is bad enough that edges missed work or other events.  07/25/2016 update-patient continues to be seen for the Scheuermann's disease. We continued to attempt osteopathic manipulation and only gets some very minimal improvement for hours to days. Patient was having more depression with the pain. Patient was to start Effexor. Continue all other conservative therapy. Patient states no improvement yet.  Still having pain. Still the back pain.  No numbness, no radiation.   08/21/2016 update-patient states that the pain continues. Patient states on his toes intermittent and can be with or without activity. Still on the left sign. Can be severe that stops him from activity. Causing worsening depression as well. Started on Effexor. Has helped him with increasing activity during the day because of increasing energy but otherwise that it seems to have done so far, and patient is concerned because nothing seems to be helping.. Foot pain maybe a little better with the orthotics.    July 30th 2018-increased Effexor. Doing 3 pills M.D. some Milligrams. Patient states has not noticed improvement. Continues to have severe amount of pain on the lateral aspect of the lumbar spine. Since then he  will have a pop it causes severe pain and then seems to help it. Biopsies back multiple times throughout the day. Does get benefit from a manipulation states it's only short-term.  August 17,18- Repeat MRI showed no changes. Reviewed Gleason apparently today. Patient though has had  significant worsening. 16 pounds in 2 weeks. No recent. Changes in diet the patient didn't change the Effexor to higher dose. Feels like it has helped a little bit with his mood and the depression but not helping with the  Wt Readings from Last 3 Encounters:  09/29/16 248 lb (112.5 kg)  09/11/16 232 lb (105.2 kg)  07/25/16 234 lb (106.1 kg)    previous imaging: December 30 2015 a CT scan of thoracic spine. Done have Scheuermann's disease. Seems to be mostly from T6-T12 and mild arthritic changes with kidney stone formation noted bilaterally. Arise taken August 2018 of the lumbar and thoracic spine showed some shoe Scheuermann's disease in the T7-T12 and a very small narrowing at L4-L5 but otherwise unremarkable.   Current Outpatient Prescriptions:  .  albuterol (PROVENTIL HFA;VENTOLIN HFA) 108 (90 Base) MCG/ACT inhaler, Inhale 2 puffs into the lungs as needed for wheezing or shortness of breath., Disp: 1 Inhaler, Rfl: 0 .  amphetamine-dextroamphetamine (ADDERALL XR) 30 MG 24 hr capsule, Take 2 capsules (60 mg total) by mouth daily., Disp: 60 capsule, Rfl: 0 .  amphetamine-dextroamphetamine (ADDERALL) 30 MG tablet, Take 1.5 tablets by mouth daily. 4pm, Disp: 45 tablet, Rfl: 0 .  Diclofenac Sodium (PENNSAID) 2 % SOLN, Apply 1 pump twice daily as needed., Disp: 112 g, Rfl: 3 .  HYDROcodone-acetaminophen (NORCO) 10-325 MG tablet, Take 1-2 tablets by mouth 3 (three) times daily as needed., Disp: 90 tablet, Rfl: 0 .  traZODone (DESYREL) 50 MG tablet, Take 0.5-1 tablets (25-50 mg total) by mouth at bedtime as needed for sleep., Disp: 90 tablet, Rfl: 0 .  venlafaxine XR (EFFEXOR XR) 150 MG 24 hr capsule, Take 1 capsule (150 mg total) by mouth daily with breakfast., Disp: 90 capsule, Rfl: 1 .  venlafaxine XR (EFFEXOR-XR) 37.5 MG 24 hr capsule, Take 3 capsules (112.5 mg total) by mouth daily with breakfast., Disp: 90 capsule, Rfl: 1 .  Vitamin D, Ergocalciferol, (DRISDOL) 50000 units CAPS capsule, Take 1  capsule (50,000 Units total) by mouth every 7 (seven) days., Disp: 12 capsule, Rfl: 0 .  zolpidem (AMBIEN) 10 MG tablet, Take 1 tablet (10 mg total) by mouth at bedtime as needed., Disp: 30 tablet, Rfl: 0  Past Medical History:  Diagnosis Date  . ADD (attention deficit disorder)   . Anxiety    Past Surgical History:  Procedure Laterality Date  . KNEE SURGERY    . WISDOM TOOTH EXTRACTION  2013   Social History   Social History  . Marital status: Single    Spouse name: N/A  . Number of children: N/A  . Years of education: N/A   Social History Main Topics  . Smoking status: Never Smoker  . Smokeless tobacco: Never Used  . Alcohol use 7.2 oz/week    7 Standard drinks or equivalent, 5 Shots of liquor per week  . Drug use: No  . Sexual activity: Yes   Other Topics Concern  . None   Social History Narrative   Caffeine 1-2 cups a week   No Known Allergies Family History  Problem Relation Age of Onset  . Stroke Maternal Grandmother   . Cancer Paternal Grandmother        Breast  .  Hypertension Paternal Grandmother   . Diabetes Paternal Grandfather   . Prostate cancer Father        and grandfather    Past medical history, social, surgical and family history all reviewed in electronic medical record.  No pertanent information unless stated regarding to the chief complaint.   Review of Systems: No headache, visual changes, nausea, vomiting, diarrhea, constipation, dizziness, abdominal pain, skin rash, fevers, chills, night sweats, weight loss, swollen lymph nodes,  chest pain, shortness of breath,  Positive mood changes, muscle aches and body aches Review of systems reviewed again 09/29/2016  Objective  Blood pressure 120/86, pulse 84, height 6' (1.829 m), weight 248 lb (112.5 kg).   Systems examined below as of 09/29/16 General: NAD A&O x3 mood, affect normal  HEENT: Pupils equal, extraocular movements intact no nystagmus Respiratory: not short of breath at rest or with  speaking Cardiovascular: No lower extremity edema, non tender Skin: Warm dry intact with no signs of infection or rash on extremities or on axial skeleton. Abdomen: Soft nontender, no masses Neuro: Cranial nerves  intact, neurovascularly intact in all extremities with 2+ DTRs and 2+ pulses. Lymph: No lymphadenopathy appreciated today  Gait normal with good balance and coordination.  MSK: Non tender with full range of motion and good stability and symmetric strength and tone of shoulders, elbows, wrist,  knee hips and ankles bilaterally.       Back Exam:  Inspection: Unremarkable  Motion: Flexion 40 deg, Extension 25 deg, Side Bending to 35 deg bilaterally,  Rotation to 35 deg bilaterally  SLR laying: Negative  XSLR laying: Negative  Palpable tenderness: Tender in the thoracolumbar juncture on the left sign. Severe and out of proportion to the amount of palpation.Marland Kitchen FABER: negative. Sensory change: Gross sensation intact to all lumbar and sacral dermatomes.  Reflexes: 2+ at both patellar tendons, 2+ at achilles tendons, Babinski's downgoing.  Strength at foot  Plantar-flexion: 5/5 Dorsi-flexion: 5/5 Eversion: 5/5 Inversion: 5/5  Leg strength  Quad: 5/5 Hamstring: 5/5 Hip flexor: 5/5 Hip abductors: 5/5  Gait unremarkable.    Osteopathic findings C2 flexed rotated and side bent right C4 flexed rotated and side bent left C7 flexed rotated and side bent left T3 extended rotated and side bent right inhaled third rib T5 extended rotated and side bent left T11 extended rotated and side bent left L2 flexed rotated and side bent right Sacrum right on right     Impression and Recommendations:     This case required medical decision making of moderate complexity.      Note: This dictation was prepared with Dragon dictation along with smaller phrase technology. Any transcriptional errors that result from this process are unintentional.

## 2016-09-29 NOTE — Assessment & Plan Note (Signed)
Decision today to treat with OMT was based on Physical Exam  After verbal consent patient was treated with HVLA, ME, FPR techniques in cervical, thoracic, lumbar and sacral areas  Patient tolerated the procedure well with improvement in symptoms  Patient given exercises, stretches and lifestyle modifications  See medications in patient instructions if given  Patient will follow up in 4-6 weeks 

## 2016-09-29 NOTE — Assessment & Plan Note (Signed)
Patient does have significantly low testosterone and a 26 year old male. This could be contribute to some of the muscle aches, fatigue, as well as mood disorder that patient seems to be having at this time. Patient will be sent to endocrinology to see if we can further evaluate and possible treatment. Been discussed at this point to limit other over-the-counter things as well as potentially soy supplements.

## 2016-09-29 NOTE — Assessment & Plan Note (Signed)
Significant weight gain over the course last 2 weeks. Did increase the Effexor and this could be potential. Not likely. Patient will monitor weight, will come back in 2 weeks and re-weigh

## 2016-09-29 NOTE — Patient Instructions (Addendum)
Good to see you  Frank [name] is your friend.  We will get you in with endocrinology check on the testosterone Change the orthotics Try to limit wrist extension for 1-2 weeks.  See me again in 2 weeks to check the weight again and we will discuss effexor as well.

## 2016-10-03 ENCOUNTER — Ambulatory Visit: Payer: PRIVATE HEALTH INSURANCE | Admitting: Family Medicine

## 2016-10-09 ENCOUNTER — Telehealth: Payer: Self-pay | Admitting: Family Medicine

## 2016-10-09 NOTE — Telephone Encounter (Signed)
Please advise 

## 2016-10-09 NOTE — Telephone Encounter (Signed)
[name] MOTHER CALLING WANTING YOU TO KNOW THAT HER SONS PAIN IS WORST THAT HER SON WANTED TO CHANGE TO ANOTHER PAIN MEDICINE OR UP THE DOSE SHE ALSO STATES THAT HE HAS STARTED A NEW JOB AND NOT GETTING ANY SLEEP SHE ALSO WANT YOU TO LOOK AT THE NOTES AND LABS THAT DR Katrinka Blazing DONE ON HER SON PLEASE CALL MOM AS SOON AS POSSIBLE

## 2016-10-09 NOTE — Telephone Encounter (Signed)
Please call this patient's mother. The patient needs to see me for refills. I've been trying to reach him, but he has not read any of my messages in My Chart, and has not responded to phone messages. I have been reviewing Dr. Claiborne Rigg notes, but still need to see him, since I am prescribing the controlled substances.

## 2016-10-12 ENCOUNTER — Other Ambulatory Visit: Payer: 59

## 2016-10-12 ENCOUNTER — Encounter: Payer: Self-pay | Admitting: Family Medicine

## 2016-10-12 ENCOUNTER — Ambulatory Visit (INDEPENDENT_AMBULATORY_CARE_PROVIDER_SITE_OTHER): Payer: 59 | Admitting: Family Medicine

## 2016-10-12 VITALS — BP 122/80 | HR 68 | Wt 236.0 lb

## 2016-10-12 DIAGNOSIS — M4200 Juvenile osteochondrosis of spine, site unspecified: Secondary | ICD-10-CM | POA: Diagnosis not present

## 2016-10-12 DIAGNOSIS — M999 Biomechanical lesion, unspecified: Secondary | ICD-10-CM | POA: Diagnosis not present

## 2016-10-12 DIAGNOSIS — R0789 Other chest pain: Secondary | ICD-10-CM | POA: Diagnosis not present

## 2016-10-12 DIAGNOSIS — R634 Abnormal weight loss: Secondary | ICD-10-CM | POA: Diagnosis not present

## 2016-10-12 NOTE — Progress Notes (Signed)
Tawana ScaleZach Takari Duncombe D.O.  Sports Medicine [address] [place], [place] [address] Phone: [phone] Subjective:     CC: Multiple joint pain f/u   BJY:NWGNFAOZHYHPI:Subjective  Frank [name] is a 26 y.o. male coming in with complaint of multiple joint pain. Patient has seen many different providers and is still considering a spinal stimulator is secondary to the amount of chronic pain.   Patient states unfortunately continues to have chronic left-sided thoracic spine pain with sometimes radiation down the leg. The intramedullary exercises on a regular basis. We attempted to manipulation  Patient also had laboratory workup. Patient was found to have a low vitamin D 27 but autoimmune labs were unremarkable.   03/27/2016 update- patient has not noticed significant improvement with the vitamin D. States that he only has 1-2 days of improvement with the manipulation.  Patient says that he continues to have pain. Seems to increasing frequency but not in severity of the pain then he has been having. Patient states sometimes it can be so severe that it seems to be unrelenting and stops him from activities as well as sleeping at night.   Patient is also coming in with a new problem. States right foot pain. Was walking a significant amount in [place] and states that at the in the long days of having a throbbing sensation in the middle of his foot. Patient denies any numbness. States though that there is a tingling. Denies any weakness. Does not remember any true injury.  May 08 2016 update- patient continues to have cramping pain. We didn't change some different exercises. Patient was also started on gabapentin for some of the relief at nighttime for this as well as patient's neuroma of the right foot. Patient states Regarding his back he seems to be doing relatively well. States and still making some progress. States that it is very slow. Not making significant strides aunt this time. Patient though states that  there is no worsening of symptoms.  07/04/2016 update- patient is having worsening symptoms overall. Patient states that the pain is unrelenting. Patient is here with his mother. Patient states that this is causing some underlying depression as well. States that the pain medications that seems to be helping. Getting this from another provider. Continues the once weekly vitamin D with very minimal benefits. Patient states that sometimes the pain is bad enough that edges missed work or other events.  07/25/2016 update-patient continues to be seen for the Scheuermann's disease. We continued to attempt osteopathic manipulation and only gets some very minimal improvement for hours to days. Patient was having more depression with the pain. Patient was to start Effexor. Continue all other conservative therapy. Patient states no improvement yet.  Still having pain. Still the back pain.  No numbness, no radiation.   08/21/2016 update-patient states that the pain continues. Patient states on his toes intermittent and can be with or without activity. Still on the left sign. Can be severe that stops him from activity. Causing worsening depression as well. Started on Effexor. Has helped him with increasing activity during the day because of increasing energy but otherwise that it seems to have done so far, and patient is concerned because nothing seems to be helping.. Foot pain maybe a little better with the orthotics.    July 30th 2018-increased Effexor. Doing 3 pills M.D. some Milligrams. Patient states has not noticed improvement. Continues to have severe amount of pain on the lateral aspect of the lumbar spine. Since then he  will have a pop it causes severe pain and then seems to help it. Biopsies back multiple times throughout the day. Does get benefit from a manipulation states it's only short-term.  August 17,18- Repeat MRI showed no changes. Reviewed Gleason apparently today. Patient though has had  significant worsening. 16 pounds in 2 weeks. No recent. Changes in diet the patient didn't change the Effexor to higher dose. Feels like it has helped a little bit with his mood and the depression but not helping with the  10/12/2016 update-patient has lost weight again. Has just been a little bit more cognizant of his diet. Patient hasn't had no side effects medicine. Still making no significant progress. Continues to have significant amount of pain. Patient is concerned because sentences insurance well be decreasing or discontinuing in the next 48 hours and still does not have quite and answered. Was found to have low testosterone and is supposed to be seen endocrinology in the future.  Wt Readings from Last 3 Encounters:  10/12/16 236 lb (107 kg)  09/29/16 248 lb (112.5 kg)  09/11/16 232 lb (105.2 kg)    previous imaging: December 30 2015 a CT scan of thoracic spine. Done have Scheuermann's disease. Seems to be mostly from T6-T12 and mild arthritic changes with kidney stone formation noted bilaterally. Arise taken August 2018 of the lumbar and thoracic spine showed some shoe Scheuermann's disease in the T7-T12 and a very small narrowing at L4-L5 but otherwise unremarkable.   Current Outpatient Prescriptions:  .  albuterol (PROVENTIL HFA;VENTOLIN HFA) 108 (90 Base) MCG/ACT inhaler, Inhale 2 puffs into the lungs as needed for wheezing or shortness of breath., Disp: 1 Inhaler, Rfl: 0 .  amphetamine-dextroamphetamine (ADDERALL XR) 30 MG 24 hr capsule, Take 2 capsules (60 mg total) by mouth daily., Disp: 60 capsule, Rfl: 0 .  amphetamine-dextroamphetamine (ADDERALL) 30 MG tablet, Take 1.5 tablets by mouth daily. 4pm, Disp: 45 tablet, Rfl: 0 .  Diclofenac Sodium (PENNSAID) 2 % SOLN, Apply 1 pump twice daily as needed., Disp: 112 g, Rfl: 3 .  HYDROcodone-acetaminophen (NORCO) 10-325 MG tablet, Take 1-2 tablets by mouth 3 (three) times daily as needed., Disp: 90 tablet, Rfl: 0 .  traZODone (DESYREL)  50 MG tablet, Take 0.5-1 tablets (25-50 mg total) by mouth at bedtime as needed for sleep., Disp: 90 tablet, Rfl: 0 .  venlafaxine XR (EFFEXOR XR) 150 MG 24 hr capsule, Take 1 capsule (150 mg total) by mouth daily with breakfast., Disp: 90 capsule, Rfl: 1 .  venlafaxine XR (EFFEXOR-XR) 37.5 MG 24 hr capsule, Take 3 capsules (112.5 mg total) by mouth daily with breakfast., Disp: 90 capsule, Rfl: 1 .  Vitamin D, Ergocalciferol, (DRISDOL) 50000 units CAPS capsule, Take 1 capsule (50,000 Units total) by mouth every 7 (seven) days., Disp: 12 capsule, Rfl: 0 .  zolpidem (AMBIEN) 10 MG tablet, Take 1 tablet (10 mg total) by mouth at bedtime as needed., Disp: 30 tablet, Rfl: 0  Past Medical History:  Diagnosis Date  . ADD (attention deficit disorder)   . Anxiety    Past Surgical History:  Procedure Laterality Date  . KNEE SURGERY    . WISDOM TOOTH EXTRACTION  2013   Social History   Social History  . Marital status: Single    Spouse name: N/A  . Number of children: N/A  . Years of education: N/A   Social History Main Topics  . Smoking status: Never Smoker  . Smokeless tobacco: Never Used  . Alcohol use 7.2 oz/week  7 Standard drinks or equivalent, 5 Shots of liquor per week  . Drug use: No  . Sexual activity: Yes   Other Topics Concern  . None   Social History Narrative   Caffeine 1-2 cups a week   No Known Allergies Family History  Problem Relation Age of Onset  . Stroke Maternal Grandmother   . Cancer Paternal Grandmother        Breast  . Hypertension Paternal Grandmother   . Diabetes Paternal Grandfather   . Prostate cancer Father        and grandfather    Past medical history, social, surgical and family history all reviewed in electronic medical record.  No pertanent information unless stated regarding to the chief complaint.   Review of Systems: No headache, visual changes, nausea, vomiting, diarrhea, constipation, dizziness, abdominal pain, skin rash, fevers,  chills, night sweats, weight loss, swollen lymph nodes,  chest pain, shortness of breath,  Positive mood changes, muscle aches and body aches Review of systems reviewed again 10/12/2016.  Objective  Blood pressure 122/80, pulse 68, weight 236 lb (107 kg).   Systems examined below as of 10/12/16 General: NAD A&O x3 mood, affect normal  HEENT: Pupils equal, extraocular movements intact no nystagmus Respiratory: not short of breath at rest or with speaking Cardiovascular: No lower extremity edema, non tender Skin: Warm dry intact with no signs of infection or rash on extremities or on axial skeleton. Abdomen: Soft nontender, no masses Neuro: Cranial nerves  intact, neurovascularly intact in all extremities with 2+ DTRs and 2+ pulses. Lymph: No lymphadenopathy appreciated today  Gait normal with good balance and coordination.  MSK: Non tender with full range of motion and good stability and symmetric strength and tone of shoulders, elbows, wrist,  knee hips and ankles bilaterally.       Back Exam:  Inspection: Mild loss of lordosis which is new Motion: Flexion 40 deg, Extension 25 deg, Side Bending to 35 deg bilaterally,  Rotation to 35 deg bilaterally  SLR laying: Negative  XSLR laying: Negative  Palpable tenderness: Tender in the thoracolumbar juncture on the left sign. Severe and out of proportion to the amount of palpation.. Once again same as previous exam FABER: negative. Sensory change: Gross sensation intact to all lumbar and sacral dermatomes.  Reflexes: 2+ at both patellar tendons, 2+ at achilles tendons, Babinski's downgoing.  Strength at foot  Plantar-flexion: 5/5 Dorsi-flexion: 5/5 Eversion: 5/5 Inversion: 5/5  Leg strength  Quad: 5/5 Hamstring: 5/5 Hip flexor: 5/5 Hip abductors: 5/5  Gait unremarkable.   Osteopathic findings C2 flexed rotated and side bent right C4 flexed rotated and side bent left C6 flexed rotated and side bent left T3 extended rotated and side  bent right inhaled third rib T10 extended rotated and side bent left L2 flexed rotated and side bent left Sacrum right on right      Impression and Recommendations:     This case required medical decision making of moderate complexity.      Note: This dictation was prepared with Dragon dictation along with smaller phrase technology. Any transcriptional errors that result from this process are unintentional.

## 2016-10-12 NOTE — Patient Instructions (Signed)
Sorry man for not having the answer We will try to get the CT scans. I actually do not think we need the dye.  Ice is your friend.  Continue the same medicines.  Andro gel for the month will be good and I will send it in when I can   Send me a message after you see [name], and maybe endocrinology and we will hold until your other insurance kicks in to see me.

## 2016-10-12 NOTE — Assessment & Plan Note (Addendum)
Decision today to treat with OMT was based on Physical Exam  After verbal consent patient was treated with HVLA, ME, FPR techniques in cervical, thoracic, lumbar and sacral areas  Patient tolerated the procedure well with improvement in symptoms  Patient given exercises, stretches and lifestyle modifications  See medications in patient instructions if given  Patient will follow up in 4 weeks 

## 2016-10-12 NOTE — Assessment & Plan Note (Signed)
Continues to have significant back pain that seems to be more on the flank overall. Patient has had a history of kidney's note previously. Until a CT abdomen and chest tightness within reasonable efforts at this time with us making no significant improvement in patient's pain seeming to be worsening. We will attempt to get does not know in the near future. Patient will also be treated for the low testosterone. Encourage him to continue the same medication which is the Effexor in the once weekly vitamin D for now. Patient will follow-up with me again in 4 weeks.

## 2016-10-13 ENCOUNTER — Ambulatory Visit (INDEPENDENT_AMBULATORY_CARE_PROVIDER_SITE_OTHER): Payer: 59 | Admitting: Physician Assistant

## 2016-10-13 ENCOUNTER — Telehealth: Payer: Self-pay | Admitting: Physician Assistant

## 2016-10-13 ENCOUNTER — Encounter: Payer: Self-pay | Admitting: Physician Assistant

## 2016-10-13 VITALS — BP 126/85 | HR 75 | Temp 97.5°F | Resp 18 | Ht 72.0 in | Wt 238.0 lb

## 2016-10-13 DIAGNOSIS — R7989 Other specified abnormal findings of blood chemistry: Secondary | ICD-10-CM | POA: Diagnosis not present

## 2016-10-13 DIAGNOSIS — M999 Biomechanical lesion, unspecified: Secondary | ICD-10-CM

## 2016-10-13 DIAGNOSIS — F988 Other specified behavioral and emotional disorders with onset usually occurring in childhood and adolescence: Secondary | ICD-10-CM

## 2016-10-13 DIAGNOSIS — G4701 Insomnia due to medical condition: Secondary | ICD-10-CM | POA: Diagnosis not present

## 2016-10-13 DIAGNOSIS — Z79891 Long term (current) use of opiate analgesic: Secondary | ICD-10-CM

## 2016-10-13 DIAGNOSIS — R739 Hyperglycemia, unspecified: Secondary | ICD-10-CM | POA: Diagnosis not present

## 2016-10-13 DIAGNOSIS — Z23 Encounter for immunization: Secondary | ICD-10-CM | POA: Diagnosis not present

## 2016-10-13 MED ORDER — AMPHETAMINE-DEXTROAMPHET ER 30 MG PO CP24: 30.0000 mg | ORAL_CAPSULE | Freq: Every day | ORAL | 0 refills | Status: DC

## 2016-10-13 MED ORDER — OXYCODONE-ACETAMINOPHEN 10-325 MG PO TABS: 1.0000 | ORAL_TABLET | Freq: Three times a day (TID) | ORAL | 0 refills | Status: DC | PRN

## 2016-10-13 MED ORDER — OXYCODONE-ACETAMINOPHEN 10-300 MG PO TABS: 1.0000 | ORAL_TABLET | Freq: Three times a day (TID) | ORAL | 0 refills | Status: DC | PRN

## 2016-10-13 MED ORDER — AMPHETAMINE-DEXTROAMPHETAMINE 30 MG PO TABS: 45.0000 mg | ORAL_TABLET | Freq: Two times a day (BID) | ORAL | 0 refills | Status: DC

## 2016-10-13 NOTE — Assessment & Plan Note (Addendum)
Resume aquatic PT in [place]. STOP hydrocodone. Try oxycodone. Let me know how that's working. If he needs 2 tablets TID, he'll need a new prescription in 2 weeks. If this isn't adequate, would recommend Pain Management.

## 2016-10-13 NOTE — Progress Notes (Signed)
Patient ID: Frank [name], male    DOB: 10-11-1990, 26 y.o.   MRN: [id number]  PCP: Harrison Mons, PA-C  Chief Complaint  Patient presents with  . Medication Refill    Pt states he would like his pain meds refilled and Adderall but states that he is aware that doasages might be changes.    Subjective:   Presents for evaluation of chronic pain and resulting insomnia, and ADD. He is accompanied by both his parents.  For the past 3+ years he has been experiencing severe back pain. He has tried multiple treatments (see my note 01/17/2016) and is currently seeing Dr. Charlann Boxer. Due to ineffectiveness and adverse effects (sweating, GI upset), he is tapering off venlafaxine. Hydrocodone isn't helping enough. He takes 2 10-325 mg tablets in the morning and again in the afternoon, just to be able to focus at work. Without it, all he can think about is his pain. He is able to fall asleep easily at night, but then is awakened by his pain and is unable to fall back asleep because of the pain.  Recently his testosterone level was checked and found to be low. He has been referred to endocrinology and has an appointment on 11/14/2016.  He has a new job, and is in between insurance plans at present.   He is continuing to work out, despite his pain. He recalls that aquatic PT helped in the past and is interested in doing that again. He stopped because he was determined to have met maximum benefit, but wonders if it would help again. Relates that doing the exercises helped his pain.  Has eliminated carbs and sugar. Trying to lose weight.  He's beginning to think about how to cope with this level of pain, about this being the "new normal." Finds himself breaking down emotionally sometimes thinking about that.   He has found that taking only 30 mg of Adderall XR in the am and then increasing the IR to twice daily has helped him and reduced the "drop off" feeling as the dose wears off. He'd like his  prescriptions to reflect how he is taking it.   Review of Systems  Constitutional: Positive for diaphoresis (improving with reducing venlafaxine) and fatigue. Negative for activity change, appetite change, chills, fever and unexpected weight change.  HENT: Negative.   Eyes: Negative.   Respiratory: Negative.   Cardiovascular: Negative.   Gastrointestinal: Negative.   Endocrine: Negative.   Genitourinary: Negative.   Musculoskeletal: Positive for back pain and myalgias. Negative for arthralgias, gait problem, joint swelling, neck pain and neck stiffness.  Skin: Negative.   Allergic/Immunologic: Negative.   Neurological: Negative.   Hematological: Negative.   Psychiatric/Behavioral: Positive for dysphoric mood and sleep disturbance. Negative for agitation, behavioral problems, confusion, decreased concentration, hallucinations, self-injury and suicidal ideas. The patient is nervous/anxious. The patient is not hyperactive.        Patient Active Problem List   Diagnosis Date Noted  . Low testosterone in male 09/29/2016  . 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 Chronic prescription opiate use 01/17/2016  . Scheuermann's disease 12/21/2015  . Nonallopathic lesion of thoracic region 12/21/2015  . Nonallopathic lesion of sacral region 12/21/2015  . Nonallopathic lesion of lumbosacral region 12/21/2015  . Myofascial pain 10/12/2014  . Kyphosis of thoracic region 10/02/2014  . Degeneration of intervertebral disc of thoracic region 10/02/2014  . Excessive daytime sleepiness 03/04/2014  . Patient overweight 03/04/2014  . Snoring 03/04/2014  .  Insomnia 01/30/2012  . ADD (attention deficit disorder) 08/23/2011  . Acne 08/23/2011  . History of OCD (obsessive compulsive disorder) 08/23/2011  . BMI 38.0-38.9,adult 08/23/2011  . Narcolepsy 07/26/2011     Prior to Admission medications   Medication Sig Start Date End Date Taking? Authorizing Provider    amphetamine-dextroamphetamine (ADDERALL XR) 30 MG 24 hr capsule Take 2 capsules (60 mg total) by mouth daily. 09/07/16  Yes Jkai Arwood, PA-C  amphetamine-dextroamphetamine (ADDERALL) 30 MG tablet Take 1.5 tablets by mouth daily. 4pm 09/07/16  Yes Ellan Tess, PA-C  HYDROcodone-acetaminophen (NORCO) 10-325 MG tablet Take 1-2 tablets by mouth 3 (three) times daily as needed. 09/07/16  Yes Rudolf Blizard, PA-C  traZODone (DESYREL) 50 MG tablet Take 0.5-1 tablets (25-50 mg total) by mouth at bedtime as needed for sleep. 09/20/16  Yes Lyndal Pulley, DO  venlafaxine XR (EFFEXOR XR) 150 MG 24 hr capsule Take 1 capsule (150 mg total) by mouth daily with breakfast. 09/20/16  Yes Hulan Saas M, DO  venlafaxine XR (EFFEXOR-XR) 37.5 MG 24 hr capsule Take 3 capsules (112.5 mg total) by mouth daily with breakfast. 08/21/16  Yes Lyndal Pulley, DO  Vitamin D, Ergocalciferol, (DRISDOL) 50000 units CAPS capsule Take 1 capsule (50,000 Units total) by mouth every 7 (seven) days. 07/22/16  Yes Lyndal Pulley, DO  zolpidem (AMBIEN) 10 MG tablet Take 1 tablet (10 mg total) by mouth at bedtime as needed. 09/07/16  Yes Lizanne Erker, PA-C  albuterol (PROVENTIL HFA;VENTOLIN HFA) 108 (90 Base) MCG/ACT inhaler Inhale 2 puffs into the lungs as needed for wheezing or shortness of breath. Patient not taking: Reported on 10/13/2016 08/22/16   Lyndal Pulley, DO     No Known Allergies     Objective:  Physical Exam  Constitutional: He is oriented to person, place, and time. He appears well-developed and well-nourished. He is active and cooperative. No distress.  BP 126/85 (BP Location: Right Arm, Patient Position: Sitting, Cuff Size: Normal)   Pulse 75   Temp (!) 97.5 F (36.4 C)   Resp 18   Ht 6' (1.829 m)   Wt 238 lb (108 kg)   SpO2 98%   BMI 32.28 kg/m   HENT:  Head: Normocephalic and atraumatic.  Right Ear: Hearing normal.  Left Ear: Hearing normal.  Eyes: Conjunctivae are normal. No scleral icterus.   Neck: Normal range of motion. Neck supple. No thyromegaly present.  Cardiovascular: Normal rate, regular rhythm and normal heart sounds.   Pulses:      Radial pulses are 2+ on the right side, and 2+ on the left side.  Pulmonary/Chest: Effort normal and breath sounds normal.  Lymphadenopathy:       Head (right side): No tonsillar, no preauricular, no posterior auricular and no occipital adenopathy present.       Head (left side): No tonsillar, no preauricular, no posterior auricular and no occipital adenopathy present.    He has no cervical adenopathy.       Right: No supraclavicular adenopathy present.       Left: No supraclavicular adenopathy present.  Neurological: He is alert and oriented to person, place, and time. No sensory deficit.  Skin: Skin is warm, dry and intact. No rash noted. No cyanosis or erythema. Nails show no clubbing.  Psychiatric: His speech is normal and behavior is normal. Judgment and thought content normal. His mood appears not anxious. His affect is labile. His affect is not angry, not blunt and not inappropriate. Cognition and memory  are normal. He exhibits a depressed mood.       Assessment & Plan:   Problem List Items Addressed This Visit    ADD (attention deficit disorder) - Primary    Reduce XR to 30 mg QAM and increase IR to BID. Let me know how that is working in the next 2-4 weeks.      Relevant Medications   amphetamine-dextroamphetamine (ADDERALL XR) 30 MG 24 hr capsule   amphetamine-dextroamphetamine (ADDERALL) 30 MG tablet   Insomnia    Due to pain.      Nonallopathic lesion of thoracic region    Resume aquatic PT in [place]. STOP hydrocodone. Try oxycodone. Let me know how that's working. If he needs 2 tablets TID, he'll need a new prescription in 2 weeks. If this isn't adequate, would recommend Pain Management.      Relevant Orders   Ambulatory referral to Physical Therapy   Chronic prescription opiate use    Current regimen is  ineffective. Stop hydrocodone. Trial of oxycodone. Update UDS today. NCSSRS reviewed. All prescriptions known to me.      Relevant Medications   oxycodone-acetaminophen (LYNOX) 10-300 MG tablet   Other Relevant Orders   ToxASSURE Select 13 (MW), Urine   Low testosterone in male    Has endocrinology appointment 11/14/2016 in [place].        Other Visit Diagnoses    Need for influenza vaccination       Relevant Orders   Flu Vaccine QUAD 36+ mos IM (Completed)   Hyperglycemia       Relevant Orders   Hemoglobin A1c       No Follow-up on file.   Fara Chute, PA-C [facility]

## 2016-10-13 NOTE — Assessment & Plan Note (Signed)
Current regimen is ineffective. Stop hydrocodone. Trial of oxycodone. Update UDS today. NCSSRS reviewed. All prescriptions known to me.

## 2016-10-13 NOTE — Patient Instructions (Addendum)
Make a list of all the medications in that drawer. Include the dates and the doses/instructions and any benefit/effect or side effects you recall. Send it to me so I can put it in your chart. Specifically check for Lyrica (pregabalin)    IF you received an x-ray today, you will receive an invoice from [facility]. Please contact [facility] at [phone] with questions or concerns regarding your invoice.   IF you received labwork today, you will receive an invoice from [facility]. Please contact [facility] at [phone] with questions or concerns regarding your invoice.   Our billing staff will not be able to assist you with questions regarding bills from these companies.  You will be contacted with the lab results as soon as they are available. The fastest way to get your results is to activate your My Chart account. Instructions are located on the last page of this paperwork. If you have not heard from us regarding the results in 2 weeks, please contact this office.

## 2016-10-13 NOTE — Telephone Encounter (Signed)
Spoke with pharmacist, Mardella LaymanLindsey.  They do not stock the 10/300 formulation of oxycodone-APAP. In addition, she is concerned that he filled his previous Adderall prescriptions on 8/13.  Explained our plan with changing from hydrocodone to oxycodone and reducing Adderall XR and increasing IR.  We agreed that she would fill the oxycodone-APAP 10/325, and the Adderall IR, but not the Adderall XR.  She then stated that she was wary because of the way the "patient presented himself" and because she had never spoken with a provider who had to look up their NPI number.  I replied that I rarely need to know my NPI number, so the 10 digits are not committed to memory.  I do, however, have my license number (which she did not request) and my DEA  Number memorized, which are the required numbers for prescribing. She cut me off, advising me that she would fill the medications, and repeated that she has never known a provider who had not memorized their NPI number.

## 2016-10-13 NOTE — Assessment & Plan Note (Signed)
Reduce XR to 30 mg QAM and increase IR to BID. Let me know how that is working in the next 2-4 weeks.

## 2016-10-13 NOTE — Assessment & Plan Note (Addendum)
Has endocrinology appointment 11/14/2016 in [place].

## 2016-10-13 NOTE — Assessment & Plan Note (Signed)
Due to pain. 

## 2016-10-14 LAB — HEMOGLOBIN A1C
Est. average glucose Bld gHb Est-mCnc: 94 mg/dL
HEMOGLOBIN A1C: 4.9 % (ref 4.8–5.6)

## 2016-10-18 LAB — TOXASSURE SELECT 13 (MW), URINE

## 2016-10-19 NOTE — Telephone Encounter (Signed)
Patient was seen 10/13/16, concerns addressed at that time./ S.Denyce Harr,CMA

## 2016-10-25 ENCOUNTER — Telehealth: Payer: Self-pay | Admitting: Family Medicine

## 2016-10-25 DIAGNOSIS — N200 Calculus of kidney: Secondary | ICD-10-CM

## 2016-10-25 DIAGNOSIS — R0789 Other chest pain: Secondary | ICD-10-CM

## 2016-10-25 NOTE — Telephone Encounter (Signed)
Spoke to patient. Advised him we have reordered 2 CT scans & that [facility] will [facility] him to schedule an appt. Once he has scheduled that appt he can call our office to schedule a f/u appt with Dr. Katrinka BlazingSmith. Pt understood.

## 2016-10-25 NOTE — Telephone Encounter (Signed)
Parent states that patient has cobra until the end of the month.  States that there was a scan that Dr. Katrinka BlazingSmith was wanting patient to get.  She would like to get this scheduled and a follow up with Dr. Katrinka BlazingSmith on the same day.  Is requesting a day 3pm or later or a morning 8am.  She does prefer Mondays.

## 2016-11-08 ENCOUNTER — Other Ambulatory Visit: Payer: Self-pay | Admitting: *Deleted

## 2016-11-08 MED ORDER — VITAMIN D (ERGOCALCIFEROL) 1.25 MG (50000 UNIT) PO CAPS: 50000.0000 [IU] | ORAL_CAPSULE | ORAL | 0 refills | Status: DC

## 2016-11-08 MED ORDER — TRAZODONE HCL 50 MG PO TABS: 25.0000 mg | ORAL_TABLET | Freq: Every evening | ORAL | 0 refills | Status: DC | PRN

## 2016-11-08 NOTE — Telephone Encounter (Signed)
Refill request for trazodone  & Vitamin D 50000IU sent into pharmacy.

## 2016-11-09 ENCOUNTER — Other Ambulatory Visit: Payer: Self-pay

## 2016-11-10 ENCOUNTER — Other Ambulatory Visit: Payer: Self-pay | Admitting: Physician Assistant

## 2016-11-10 ENCOUNTER — Encounter: Payer: Self-pay | Admitting: Physician Assistant

## 2016-11-10 DIAGNOSIS — F988 Other specified behavioral and emotional disorders with onset usually occurring in childhood and adolescence: Secondary | ICD-10-CM

## 2016-11-10 DIAGNOSIS — G4700 Insomnia, unspecified: Secondary | ICD-10-CM

## 2016-11-10 DIAGNOSIS — G47429 Narcolepsy in conditions classified elsewhere without cataplexy: Secondary | ICD-10-CM

## 2016-11-10 MED ORDER — AMPHETAMINE-DEXTROAMPHET ER 30 MG PO CP24: 30.0000 mg | ORAL_CAPSULE | Freq: Every day | ORAL | 0 refills | Status: DC

## 2016-11-10 MED ORDER — AMPHETAMINE-DEXTROAMPHETAMINE 30 MG PO TABS: 45.0000 mg | ORAL_TABLET | Freq: Two times a day (BID) | ORAL | 0 refills | Status: DC

## 2016-11-10 MED ORDER — OXYCODONE-ACETAMINOPHEN 10-325 MG PO TABS: 1.0000 | ORAL_TABLET | Freq: Three times a day (TID) | ORAL | 0 refills | Status: DC | PRN

## 2016-11-10 NOTE — Telephone Encounter (Signed)
Meds ordered this encounter  Medications  . oxyCODONE-acetaminophen (PERCOCET) 10-325 MG tablet    Sig: Take 1-2 tablets by mouth every 8 (eight) hours as needed for pain.    Dispense:  90 tablet    Refill:  0    Order Specific Question:   Supervising Provider    Answer:   SHAW, EVA N [[id number]]  . amphetamine-dextroamphetamine (ADDERALL XR) 30 MG 24 hr capsule    Sig: Take 1 capsule (30 mg total) by mouth daily.    Dispense:  30 capsule    Refill:  0    Order Specific Question:   Supervising Provider    Answer:   SHAW, EVA N [[id number]]  . amphetamine-dextroamphetamine (ADDERALL) 30 MG tablet    Sig: Take 1.5 tablets by mouth 2 (two) times daily.    Dispense:  90 tablet    Refill:  0    Order Specific Question:   Supervising Provider    Answer:   Clelia Croft, EVA N [[id number]]    These need to be at 102 for the patient to pick up on Saturday while he is in [place].

## 2016-11-14 ENCOUNTER — Telehealth: Payer: Self-pay

## 2016-11-14 MED ORDER — ZOLPIDEM TARTRATE 10 MG PO TABS: 10.0000 mg | ORAL_TABLET | Freq: Every evening | ORAL | 0 refills | Status: DC | PRN

## 2016-11-14 NOTE — Telephone Encounter (Signed)
Please fax or call to patient's pharmacy.  Meds ordered this encounter  Medications  . zolpidem (AMBIEN) 10 MG tablet    Sig: Take 1 tablet (10 mg total) by mouth at bedtime as needed.    Dispense:  30 tablet    Refill:  0

## 2016-11-18 NOTE — Progress Notes (Signed)
Tawana ScaleZach Aizley Stenseth D.O.  Sports Medicine [address] [place], [place] [address] Phone: [phone] Subjective:     CC: Multiple joint pain f/u   BJY:[id number]:Subjective  Frank PallSamuel J [name] is a 26 y.o. male coming in with complaint of multiple joint pain. Patient has seen many different providers and is still considering a spinal stimulator is secondary to the amount of chronic pain.   Patient states unfortunately continues to have chronic left-sided thoracic spine pain with sometimes radiation down the leg. The intramedullary exercises on a regular basis. We attempted to manipulation  Patient also had laboratory workup. Patient was found to have a low vitamin D 27 but autoimmune labs were unremarkable.   03/27/2016 update- patient has not noticed significant improvement with the vitamin D. States that he only has 1-2 days of improvement with the manipulation.  Patient says that he continues to have pain. Seems to increasing frequency but not in severity of the pain then he has been having. Patient states sometimes it can be so severe that it seems to be unrelenting and stops him from activities as well as sleeping at night.   Patient is also coming in with a new problem. States right foot pain. Was walking a significant amount in [place] and states that at the in the long days of having a throbbing sensation in the middle of his foot. Patient denies any numbness. States though that there is a tingling. Denies any weakness. Does not remember any true injury.  May 08 2016 update- patient continues to have cramping pain. We didn't change some different exercises. Patient was also started on gabapentin for some of the relief at nighttime for this as well as patient's neuroma of the right foot. Patient states Regarding his back he seems to be doing relatively well. States and still making some progress. States that it is very slow. Not making significant strides aunt this time. Patient though states that  there is no worsening of symptoms.  07/04/2016 update- patient is having worsening symptoms overall. Patient states that the pain is unrelenting. Patient is here with his mother. Patient states that this is causing some underlying depression as well. States that the pain medications that seems to be helping. Getting this from another provider. Continues the once weekly vitamin D with very minimal benefits. Patient states that sometimes the pain is bad enough that edges missed work or other events.  07/25/2016 update-patient continues to be seen for the Scheuermann's disease. We continued to attempt osteopathic manipulation and only gets some very minimal improvement for hours to days. Patient was having more depression with the pain. Patient was to start Effexor. Continue all other conservative therapy. Patient states no improvement yet.  Still having pain. Still the back pain.  No numbness, no radiation.   08/21/2016 update-patient states that the pain continues. Patient states on his toes intermittent and can be with or without activity. Still on the left sign. Can be severe that stops him from activity. Causing worsening depression as well. Started on Effexor. Has helped him with increasing activity during the day because of increasing energy but otherwise that it seems to have done so far, and patient is concerned because nothing seems to be helping.. Foot pain maybe a little better with the orthotics.    July 30th 2018-increased Effexor. Doing 3 pills M.D. some Milligrams. Patient states has not noticed improvement. Continues to have severe amount of pain on the lateral aspect of the lumbar spine. Since then he  will have a pop it causes severe pain and then seems to help it. Biopsies back multiple times throughout the day. Does get benefit from a manipulation states it's only short-term.  August 17,18- Repeat MRI showed no changes. Reviewed [place] apparently today. Patient though has had  significant worsening. 16 pounds in 2 weeks. No recent. Changes in diet the patient didn't change the Effexor to higher dose. Feels like it has helped a little bit with his mood and the depression but not helping with the  10/12/2016 update-patient has lost weight again. Has just been a little bit more cognizant of his diet. Patient hasn't had no side effects medicine. Still making no significant progress. Continues to have significant amount of pain. Patient is concerned because sentences insurance well be decreasing or discontinuing in the next 48 hours and still does not have quite and answered. Was found to have low testosterone and is supposed to be seen endocrinology in the future.  Wt Readings from Last 3 Encounters:  11/20/16 240 lb (108.9 kg)  10/13/16 238 lb (108 kg)  10/12/16 236 lb (107 kg)   November 20 2016 update- patient continued to have significant chronic pain. Patient will increase in his pain medications oxycodone from his Norco. Patient was to undergo CT scans but did have difficulty with insurance.This was reordered but patient has declined to have it done at this point. States that he continues to have trouble. Patient's pain is not improving. Feels that overall the different trials we've been doing has not been making significant progress. Feels his foot pain is getting worse as well. Does have custom orthotics. Feels like they are not holding up well. Last pair was May 9 months ago.   previous imaging:    December 30 2015 a CT scan of thoracic spine. Done have Scheuermann's disease. Seems to be mostly from T6-T12 and mild arthritic changes with kidney stone formation noted bilaterally. MRI taken August 2018 of the lumbar and thoracic spine showed some shoe Scheuermann's disease in the T7-T12 and a very small narrowing at L4-L5 but otherwise unremarkable.   Current Outpatient Prescriptions:  .  albuterol (PROVENTIL HFA;VENTOLIN HFA) 108 (90 Base) MCG/ACT inhaler, Inhale 2  puffs into the lungs as needed for wheezing or shortness of breath., Disp: 1 Inhaler, Rfl: 0 .  amphetamine-dextroamphetamine (ADDERALL XR) 30 MG 24 hr capsule, Take 1 capsule (30 mg total) by mouth daily., Disp: 30 capsule, Rfl: 0 .  amphetamine-dextroamphetamine (ADDERALL) 30 MG tablet, Take 1.5 tablets by mouth 2 (two) times daily., Disp: 90 tablet, Rfl: 0 .  oxyCODONE-acetaminophen (PERCOCET) 10-325 MG tablet, Take 1-2 tablets by mouth every 8 (eight) hours as needed for pain., Disp: 90 tablet, Rfl: 0 .  traZODone (DESYREL) 50 MG tablet, Take 0.5-1 tablets (25-50 mg total) by mouth at bedtime as needed for sleep., Disp: 90 tablet, Rfl: 0 .  venlafaxine XR (EFFEXOR XR) 150 MG 24 hr capsule, Take 1 capsule (150 mg total) by mouth daily with breakfast., Disp: 90 capsule, Rfl: 1 .  venlafaxine XR (EFFEXOR-XR) 37.5 MG 24 hr capsule, Take 3 capsules (112.5 mg total) by mouth daily with breakfast., Disp: 90 capsule, Rfl: 1 .  Vitamin D, Ergocalciferol, (DRISDOL) 50000 units CAPS capsule, Take 1 capsule (50,000 Units total) by mouth every 7 (seven) days., Disp: 12 capsule, Rfl: 0 .  zolpidem (AMBIEN) 10 MG tablet, Take 1 tablet (10 mg total) by mouth at bedtime as needed., Disp: 30 tablet, Rfl: 0  Past Medical History:  Diagnosis Date  . ADD (attention deficit disorder)   . Anxiety    Past Surgical History:  Procedure Laterality Date  . KNEE SURGERY    . WISDOM TOOTH EXTRACTION  2013   Social History   Social History  . Marital status: Single    Spouse name: N/A  . Number of children: N/A  . Years of education: N/A   Social History Main Topics  . Smoking status: Never Smoker  . Smokeless tobacco: Never Used  . Alcohol use 7.2 oz/week    7 Standard drinks or equivalent, 5 Shots of liquor per week  . Drug use: No  . Sexual activity: Yes   Other Topics Concern  . None   Social History Narrative   Caffeine 1-2 cups a week   No Known Allergies Family History  Problem Relation Age  of Onset  . Stroke Maternal Grandmother   . Cancer Paternal Grandmother        Breast  . Hypertension Paternal Grandmother   . Diabetes Paternal Grandfather   . Prostate cancer Father        and grandfather    Past medical history, social, surgical and family history all reviewed in electronic medical record.  No pertanent information unless stated regarding to the chief complaint.   Review of Systems: No headache, visual changes, nausea, vomiting, diarrhea, constipation, dizziness, abdominal pain, skin rash, fevers, chills, night sweats, weight loss, swollen lymph nodes, body aches, joint swelling, muscle aches, chest pain, shortness of breath, mood changes.    Objective  Blood pressure 134/70, pulse 96, height 6' (1.829 m), weight 240 lb (108.9 kg), SpO2 95 %.   Systems examined below as of 11/20/16 General: NAD A&O x3 mood, affect normal  HEENT: Pupils equal, extraocular movements intact no nystagmus Respiratory: not short of breath at rest or with speaking Cardiovascular: No lower extremity edema, non tender Skin: Warm dry intact with no signs of infection or rash on extremities or on axial skeleton. Abdomen: Soft nontender, no masses Neuro: Cranial nerves  intact, neurovascularly intact in all extremities with 2+ DTRs and 2+ pulses. Lymph: No lymphadenopathy appreciated today  Gait normal with good balance and coordination.  MSK: Non tender with full range of motion and good stability and symmetric strength and tone of shoulders, elbows, wrist,  knee hips and ankles bilaterally.      Back Exam:  Inspection: Mild loss of lordosis worsening of previous exam Motion: Flexion 35 deg, Extension 25 deg, Side Bending to 35 deg bilaterally,  Rotation to 35 deg bilaterally  SLR laying: Negative  XSLR laying: Negative  Palpable tenderness: Continues to have significant pain in), lumbar junction. Patient also has some more pain over the sacroiliac joint. FABER: Tightness on the  left. Sensory change: Gross sensation intact to all lumbar and sacral dermatomes.  Reflexes: 2+ at both patellar tendons, 2+ at achilles tendons, Babinski's downgoing.  Strength at foot  Plantar-flexion: 5/5 Dorsi-flexion: 5/5 Eversion: 5/5 Inversion: 5/5  Leg strength  Quad: 5/5 Hamstring: 5/5 Hip flexor: 5/5 Hip abductors: 5/5  Gait unremarkable.   Osteopathic findings C2 flexed rotated and side bent right C4 flexed rotated and side bent left C7 flexed rotated and side bent left T3 extended rotated and side bent right inhaled third rib T10 extended rotated and side bent left L2 flexed rotated and side bent right Sacrum right on right       Impression and Recommendations:     This case required medical decision making of  moderate complexity.      Note: This dictation was prepared with Dragon dictation along with smaller phrase technology. Any transcriptional errors that result from this process are unintentional.

## 2016-11-20 ENCOUNTER — Encounter: Payer: Self-pay | Admitting: Family Medicine

## 2016-11-20 ENCOUNTER — Ambulatory Visit (INDEPENDENT_AMBULATORY_CARE_PROVIDER_SITE_OTHER): Payer: 59 | Admitting: Family Medicine

## 2016-11-20 VITALS — BP 134/70 | HR 96 | Ht 72.0 in | Wt 240.0 lb

## 2016-11-20 DIAGNOSIS — M79671 Pain in right foot: Secondary | ICD-10-CM | POA: Diagnosis not present

## 2016-11-20 DIAGNOSIS — R7989 Other specified abnormal findings of blood chemistry: Secondary | ICD-10-CM | POA: Diagnosis not present

## 2016-11-20 DIAGNOSIS — M999 Biomechanical lesion, unspecified: Secondary | ICD-10-CM

## 2016-11-20 DIAGNOSIS — M4200 Juvenile osteochondrosis of spine, site unspecified: Secondary | ICD-10-CM | POA: Diagnosis not present

## 2016-11-20 DIAGNOSIS — G5761 Lesion of plantar nerve, right lower limb: Secondary | ICD-10-CM

## 2016-11-20 DIAGNOSIS — M79672 Pain in left foot: Secondary | ICD-10-CM | POA: Diagnosis not present

## 2016-11-20 NOTE — Assessment & Plan Note (Signed)
Decision today to treat with OMT was based on Physical Exam  After verbal consent patient was treated with HVLA, ME, FPR techniques in cervical, thoracic, lumbar and sacral areas  Patient tolerated the procedure well with improvement in symptoms  Patient given exercises, stretches and lifestyle modifications  See medications in patient instructions if given  Patient will follow up in 6-8 weeks 

## 2016-11-20 NOTE — Assessment & Plan Note (Signed)
Patient is started on Clomid

## 2016-11-20 NOTE — Assessment & Plan Note (Signed)
Patient will be referred for custom orthotics but made from different material that I think will be more beneficial based on patient's size.

## 2016-11-20 NOTE — Assessment & Plan Note (Signed)
Patient does have Scheuermann's disease. Does have some degenerative intervertebral disc in the thoracic region as well. On do believe that at this point when I am been doing does not seem to being beneficial. Patient has had difficulty with different medications. I'll like to refer patient for more of a pain management seen other types of injections or potentially a spinal cord stimulator could be beneficial for this individual. Patient is young and I do not feel that chronic pain medications is likely going to be helpful. Patient will follow-up with other providers. Can follow-up with me for manipulation when needed.

## 2016-11-20 NOTE — Patient Instructions (Signed)
Good to see you  We will get you in with Berline Chough for new orthotics These will be more beefy  I will also get you in with Dr. Ollen Bowl to see if any other treatemtn will be helpful.  I am at a loss and do not know what else to do for sure.  We will make no changes in the medicine for now.  See me again in 6-8 weeks.

## 2016-11-22 ENCOUNTER — Telehealth: Payer: Self-pay | Admitting: Physician Assistant

## 2016-11-22 NOTE — Telephone Encounter (Signed)
Patient called an stated that he lives in [place] and did not want to drive 3 hours for orthotics to be ordered. I advised the patient that Dr. Katrinka Blazing placed a referral and Dr. Berline Chough would have to see him in order to make any recommendations. Patient stated that an appointment should not be necessary and refused to schedule. Patient then wanted to be transferred to Dr. Michaelle Copas office at [facility] to speak to Dr. Katrinka Blazing. I transferred the call to [facility]

## 2016-11-23 ENCOUNTER — Telehealth: Payer: Self-pay | Admitting: Family Medicine

## 2016-11-23 NOTE — Telephone Encounter (Signed)
Arena from [facility] called asking to speak with you regarding this pt.

## 2016-11-23 NOTE — Telephone Encounter (Signed)
Spoke to [employer] her to only schedule the CT Abdomen pelvis w/o.

## 2016-12-01 NOTE — Telephone Encounter (Signed)
Pt called needing information on his CT that was ordered, please call back in regard

## 2016-12-02 ENCOUNTER — Other Ambulatory Visit: Payer: Self-pay | Admitting: Family Medicine

## 2016-12-04 ENCOUNTER — Encounter: Payer: Self-pay | Admitting: Family Medicine

## 2016-12-04 ENCOUNTER — Other Ambulatory Visit: Payer: 59

## 2016-12-04 NOTE — Telephone Encounter (Signed)
Refill done.  

## 2016-12-04 NOTE — Telephone Encounter (Signed)
Discussed with pt

## 2016-12-07 NOTE — Telephone Encounter (Signed)
Pt called and would like another CT ordered and be scheduled on Monday to figure out why he his waking up with pain in his fingers and they are numb and feels like there is no circulation there.  Please advise and call back

## 2016-12-11 ENCOUNTER — Ambulatory Visit
Admission: RE | Admit: 2016-12-11 | Discharge: 2016-12-11 | Disposition: A | Discharge status: Home / Self Care | Payer: 59 | Source: Ambulatory Visit | Attending: Family Medicine | Admitting: Family Medicine

## 2016-12-11 ENCOUNTER — Other Ambulatory Visit: Payer: Self-pay | Admitting: Physician Assistant

## 2016-12-11 DIAGNOSIS — G47429 Narcolepsy in conditions classified elsewhere without cataplexy: Secondary | ICD-10-CM

## 2016-12-11 DIAGNOSIS — F988 Other specified behavioral and emotional disorders with onset usually occurring in childhood and adolescence: Secondary | ICD-10-CM

## 2016-12-11 DIAGNOSIS — G4700 Insomnia, unspecified: Secondary | ICD-10-CM

## 2016-12-11 DIAGNOSIS — N200 Calculus of kidney: Secondary | ICD-10-CM

## 2016-12-13 ENCOUNTER — Telehealth: Payer: Self-pay

## 2016-12-13 MED ORDER — ZOLPIDEM TARTRATE 10 MG PO TABS: 10.0000 mg | ORAL_TABLET | Freq: Every evening | ORAL | 0 refills | Status: DC | PRN

## 2016-12-13 MED ORDER — AMPHETAMINE-DEXTROAMPHET ER 30 MG PO CP24: 30.0000 mg | ORAL_CAPSULE | Freq: Every day | ORAL | 0 refills | Status: DC

## 2016-12-13 MED ORDER — OXYCODONE-ACETAMINOPHEN 10-325 MG PO TABS: 1.0000 | ORAL_TABLET | Freq: Three times a day (TID) | ORAL | 0 refills | Status: DC | PRN

## 2016-12-13 MED ORDER — AMPHETAMINE-DEXTROAMPHETAMINE 30 MG PO TABS: 45.0000 mg | ORAL_TABLET | Freq: Two times a day (BID) | ORAL | 0 refills | Status: DC

## 2016-12-13 NOTE — Telephone Encounter (Signed)
Patient notified via My Chart.  Meds ordered this encounter  Medications  . zolpidem (AMBIEN) 10 MG tablet    Sig: Take 1 tablet (10 mg total) by mouth at bedtime as needed.    Dispense:  30 tablet    Refill:  0  . oxyCODONE-acetaminophen (PERCOCET) 10-325 MG tablet    Sig: Take 1-2 tablets by mouth every 8 (eight) hours as needed for pain.    Dispense:  90 tablet    Refill:  0  . amphetamine-dextroamphetamine (ADDERALL XR) 30 MG 24 hr capsule    Sig: Take 1 capsule (30 mg total) by mouth daily.    Dispense:  30 capsule    Refill:  0  . amphetamine-dextroamphetamine (ADDERALL) 30 MG tablet    Sig: Take 1.5 tablets by mouth 2 (two) times daily.    Dispense:  90 tablet    Refill:  0

## 2017-01-01 ENCOUNTER — Ambulatory Visit: Payer: 59 | Admitting: Family Medicine

## 2017-01-11 ENCOUNTER — Encounter: Payer: Self-pay | Admitting: Physician Assistant

## 2017-01-11 ENCOUNTER — Telehealth: Payer: Self-pay | Admitting: Physician Assistant

## 2017-01-11 ENCOUNTER — Other Ambulatory Visit: Payer: Self-pay | Admitting: Family Medicine

## 2017-01-11 DIAGNOSIS — G47429 Narcolepsy in conditions classified elsewhere without cataplexy: Secondary | ICD-10-CM

## 2017-01-11 DIAGNOSIS — G4700 Insomnia, unspecified: Secondary | ICD-10-CM

## 2017-01-11 DIAGNOSIS — F988 Other specified behavioral and emotional disorders with onset usually occurring in childhood and adolescence: Secondary | ICD-10-CM

## 2017-01-11 MED ORDER — OXYCODONE-ACETAMINOPHEN 10-325 MG PO TABS: 1.0000 | ORAL_TABLET | Freq: Three times a day (TID) | ORAL | 0 refills | Status: DC | PRN

## 2017-01-11 MED ORDER — VITAMIN D (ERGOCALCIFEROL) 1.25 MG (50000 UNIT) PO CAPS: 50000.0000 [IU] | ORAL_CAPSULE | ORAL | 3 refills | Status: DC

## 2017-01-11 MED ORDER — AMPHETAMINE-DEXTROAMPHETAMINE 30 MG PO TABS: 45.0000 mg | ORAL_TABLET | Freq: Two times a day (BID) | ORAL | 0 refills | Status: DC

## 2017-01-11 MED ORDER — ZOLPIDEM TARTRATE 10 MG PO TABS: 10.0000 mg | ORAL_TABLET | Freq: Every evening | ORAL | 0 refills | Status: DC | PRN

## 2017-01-11 MED ORDER — AMPHETAMINE-DEXTROAMPHET ER 30 MG PO CP24: 30.0000 mg | ORAL_CAPSULE | Freq: Every day | ORAL | 0 refills | Status: DC

## 2017-01-11 MED ORDER — TRAZODONE HCL 50 MG PO TABS: 25.0000 mg | ORAL_TABLET | Freq: Every evening | ORAL | 3 refills | Status: DC | PRN

## 2017-01-11 NOTE — Telephone Encounter (Signed)
Meds ordered this encounter  Medications  . traZODone (DESYREL) 50 MG tablet    Sig: Take 0.5-1 tablets (25-50 mg total) by mouth at bedtime as needed for sleep.    Dispense:  90 tablet    Refill:  3  . Vitamin D, Ergocalciferol, (DRISDOL) 50000 units CAPS capsule    Sig: Take 1 capsule (50,000 Units total) by mouth every 7 (seven) days.    Dispense:  12 capsule    Refill:  3   See other message thread for other prescriptions.

## 2017-01-11 NOTE — Telephone Encounter (Signed)
Rx sent electronically.  Meds ordered this encounter  Medications  . zolpidem (AMBIEN) 10 MG tablet    Sig: Take 1 tablet (10 mg total) by mouth at bedtime as needed.    Dispense:  30 tablet    Refill:  0  . oxyCODONE-acetaminophen (PERCOCET) 10-325 MG tablet    Sig: Take 1-2 tablets by mouth every 8 (eight) hours as needed for pain.    Dispense:  90 tablet    Refill:  0  . amphetamine-dextroamphetamine (ADDERALL XR) 30 MG 24 hr capsule    Sig: Take 1 capsule (30 mg total) by mouth daily.    Dispense:  30 capsule    Refill:  0  . amphetamine-dextroamphetamine (ADDERALL) 30 MG tablet    Sig: Take 1.5 tablets by mouth 2 (two) times daily.    Dispense:  90 tablet    Refill:  0   See other message thread for other medications sent.

## 2017-01-11 NOTE — Telephone Encounter (Signed)
Please advise 

## 2017-01-12 ENCOUNTER — Ambulatory Visit: Payer: 59 | Admitting: Family Medicine

## 2017-01-15 ENCOUNTER — Telehealth: Payer: Self-pay

## 2017-01-15 NOTE — Telephone Encounter (Signed)
Received message from Gloriann Loanngela Payne stating patient has been calling wanting to speak with someone about Prior Auth on his oxycodone.  It has not previously needed a PA but for some reason, it does today.  I called patient and told him that I would be happy to work on it for him.  I informed patient that Avelino LeedsChelle is out of the office today, but I can get with her first thing in the morning.  He said that he has been out of the medication for approx 3 days and is in need of a refill. He stated that he called the pharmacy about 10 minutes ago and requested they fax something over for him.  I told him again that I would work on it today and speak to [name] in the AM.  He wanted to know if there was anyway to get the medication today, and I told him I did not think so.  He did not seem happy about this.

## 2017-01-15 NOTE — Telephone Encounter (Signed)
Copied from CRM [id number]. Topic: Inquiry >> Jan 15, 2017  1:44 PM Alexander BergeronBarksdale, Harvey B wrote: Reason for CRM: pt called to see if his norco Rx were ready for pickup the request was put in last Thursday and is awaiting to hear if they're ready, contact pt if needed

## 2017-01-15 NOTE — Telephone Encounter (Signed)
Received fax request for PA on patient's oxycodone.  I was advised by representative that retail pharmacy will only approve a maximum of 90 pills for 30 days, any more must be obtained through mail order.  Completed PA.  Prior Auth #is [id number] and will take from 24-72 hours for turn around.

## 2017-01-16 ENCOUNTER — Telehealth: Payer: Self-pay | Admitting: Physician Assistant

## 2017-01-16 ENCOUNTER — Encounter: Payer: Self-pay | Admitting: Physician Assistant

## 2017-01-16 NOTE — Telephone Encounter (Signed)
Patient did pick up his pain medication- due to new restriction- he is required to get a prior authorization for dispensing over a 7 day supply. He paid out of pocket for his Rx so he would have it- but would like to make sure that the prior authorization is worked on if possible. [facility] and has high hopes for help with his back. He will send Dr Leotis ShamesJeffery an update.

## 2017-01-16 NOTE — Telephone Encounter (Signed)
Gave pt message "Spoke with Renay PA is underway it can take uup to 3 days before we get approval.' Pt states that he paid out of the pocket for the prescription and would like to make sure that the prior authorization is in place before his next refill; pt states that he paid $150.00 for this prescription that he just picked up and would like to be contacted because he would like to be reimbursed by the insurance company if possible. Please contact him at [phone]; [facility] for further instruction

## 2017-01-16 NOTE — Telephone Encounter (Signed)
Spoke with Renay PA is underway it can take uup to 3 days before we get approval.

## 2017-01-16 NOTE — Telephone Encounter (Signed)
Spoke with renay PA is in works .   Left message for patient to call back.  Can take up to 3 days

## 2017-01-17 NOTE — Telephone Encounter (Signed)
Checked on status of PA for oxycodone today and it is still in the review process.  I will recheck later in the week.

## 2017-01-18 NOTE — Telephone Encounter (Signed)
Tried to call and check on patient's RX, however, the phone circuits were busy.  Then called to patient's pharmacy and asked them had he been able to pick up his RX for oxycodone.  Pharmacy staff advised me that he did get his medication as requested.

## 2017-01-22 ENCOUNTER — Ambulatory Visit: Payer: 59 | Admitting: Family Medicine

## 2017-02-07 ENCOUNTER — Other Ambulatory Visit: Payer: Self-pay | Admitting: Physician Assistant

## 2017-02-07 ENCOUNTER — Telehealth: Payer: Self-pay | Admitting: Physician Assistant

## 2017-02-07 DIAGNOSIS — G4700 Insomnia, unspecified: Secondary | ICD-10-CM

## 2017-02-07 DIAGNOSIS — G47429 Narcolepsy in conditions classified elsewhere without cataplexy: Secondary | ICD-10-CM

## 2017-02-07 DIAGNOSIS — F988 Other specified behavioral and emotional disorders with onset usually occurring in childhood and adolescence: Secondary | ICD-10-CM

## 2017-02-07 NOTE — Telephone Encounter (Signed)
Copied from CRM [phone]. Topic: Quick Communication - See Telephone Encounter >> Feb 07, 2017  1:33 PM Frank [name], Rosey Batheresa D wrote: CRM for notification. See Telephone encounter for: 02/07/17. Patient called and would like to talk to provider about his appt with neuro and has questions for her. Please call patient back.,thanks.

## 2017-02-08 NOTE — Telephone Encounter (Signed)
Error-Close Encounter 

## 2017-02-08 NOTE — Telephone Encounter (Signed)
Called pt and he states that he has questions about short term disability. I encouraged the pt to use MyChart messages but he wasn't happy that someone other you was able to see the message. Pt has an appointment with you in January.

## 2017-02-09 MED ORDER — AMPHETAMINE-DEXTROAMPHETAMINE 30 MG PO TABS: 45.0000 mg | ORAL_TABLET | Freq: Two times a day (BID) | ORAL | 0 refills | Status: DC

## 2017-02-09 MED ORDER — AMPHETAMINE-DEXTROAMPHET ER 30 MG PO CP24: 30.0000 mg | ORAL_CAPSULE | Freq: Every day | ORAL | 0 refills | Status: DC

## 2017-02-09 MED ORDER — ZOLPIDEM TARTRATE 10 MG PO TABS: 10.0000 mg | ORAL_TABLET | Freq: Every evening | ORAL | 0 refills | Status: DC | PRN

## 2017-02-09 MED ORDER — OXYCODONE-ACETAMINOPHEN 10-325 MG PO TABS: 1.0000 | ORAL_TABLET | Freq: Three times a day (TID) | ORAL | 0 refills | Status: DC | PRN

## 2017-02-10 ENCOUNTER — Telehealth: Payer: Self-pay

## 2017-02-10 NOTE — Telephone Encounter (Signed)
Spoke with father and he is not on release. Advised him that we could not release any info to him at this time. Father is requesting that [name] give his son a call regarding Disability for work prior to appt on 1/2. Best contact for pt is [phone]  Copied from CRM [phone]. Topic: General - Other >> Feb 09, 2017  2:02 PM Terisa Starraylor, Brittany L wrote: Pt's father is requesting a call back [facility] before the appt that is on 1/2. Call back is [phone]

## 2017-02-10 NOTE — Telephone Encounter (Signed)
Spoke with patient. His supervisor is recommending/requiring that he take short-term disability as he has not worked there long enough to qualify for [employer].  Is going to start a clinical trial, though it isn't clear that it will be beneficial.  Will bring forms to our scheduled visit 02/14/2017.

## 2017-02-10 NOTE — Telephone Encounter (Signed)
Spoke with patient's father.  1. Patient's company is requiring that he take short term disability to get his health in order. He needs a diagnosis to take to them in order to file a claim. As this is a new job, he does not yet qualify for [employer].  2. Patient's anxiety level is really high right now. Is there something that might help?  Advised father that I would discuss this with the patient at his visit with me on 02/14/2017.

## 2017-02-12 ENCOUNTER — Telehealth: Payer: Self-pay | Admitting: Physician Assistant

## 2017-02-12 NOTE — Telephone Encounter (Unsigned)
Copied from CRM [id number]. Topic: Quick Communication - See Telephone Encounter >> Feb 12, 2017 12:48 PM Waymon AmatoBurton, Donna F wrote: CRM for notification. See Telephone encounter for:  Pt is wanting chelle to call her regarding short term disablility  and getting the claim started before he sees her before Wednesday the paper work she has asked for is a hold up and that is what pt is calling in regards  To  best number [phone] 02/12/17.

## 2017-02-13 NOTE — Progress Notes (Signed)
Tawana ScaleZach Aster Eckrich D.O. [facility] [address] [place], [place] [address] Phone: [phone] Subjective:    I'm seeing this patient by the request  of:    CC: Back pain follow-up  GEX:[id number]:Subjective  Frank PallSamuel J [name] is a [age] y.o. male coming in with complaint of low back pain.  In mid back pain.  Patient does have a past medical history significant for Scheurmans disease.  Patient has had significant workup over the course of the last several days as well as months and effusion years.  Patient has had advanced imaging showing no other significant bony abnormalities.  No signs of any herniated disc or nerve root impingement.  Continues to have significant chronic pain.  Very mild degenerative disc disease at L4-L5.  Patient did have laboratory workup and was found to have low testosterone and is following up with endocrinology.  Patient has been having difficulty with work.  Patient was going to consider doing short-term disability and was talking to his primary care provider.  Patient has an appointment later with this.  Patient is also followed up with a pain specialist and is considering a nerve stimulator and awaiting for prior authorization.  Patient also has increased his narcotic use.  Patient also going to be discussing with other providers around the country.  Trying to figure out what his problem is.      Past Medical History:  Diagnosis Date  . ADD (attention deficit disorder)   . Anxiety    Past Surgical History:  Procedure Laterality Date  . KNEE SURGERY    . WISDOM TOOTH EXTRACTION  2013   Social History   Socioeconomic History  . Marital status: Single    Spouse name: None  . Number of children: None  . Years of education: None  . Highest education level: None  Social Needs  . Financial resource strain: None  . Food insecurity - worry: None  . Food insecurity - inability: None  . Transportation needs - medical: None  . Transportation needs - non-medical:  None  Occupational History  . None  Tobacco Use  . Smoking status: Never Smoker  . Smokeless tobacco: Never Used  Substance and Sexual Activity  . Alcohol use: Yes    Alcohol/week: 7.2 oz    Types: 7 Standard drinks or equivalent, 5 Shots of liquor per week  . Drug use: No  . Sexual activity: Yes  Other Topics Concern  . None  Social History Narrative   Caffeine 1-2 cups a week   No Known Allergies Family History  Problem Relation Age of Onset  . Stroke Maternal Grandmother   . Cancer Paternal Grandmother        Breast  . Hypertension Paternal Grandmother   . Diabetes Paternal Grandfather   . Prostate cancer Father        and grandfather     Past medical history, social, surgical and family history all reviewed in electronic medical record.  No pertanent information unless stated regarding to the chief complaint.   Review of Systems:Review of systems updated and as accurate as of 02/14/17  No headache, visual changes, nausea, vomiting, diarrhea, constipation, dizziness, abdominal pain, skin rash, fevers, chills, night sweats, weight loss, swollen lymph nodes, body aches, joint swelling,  chest pain, shortness of breath, mood changes.  Positive muscle aches  Objective  Blood pressure (!) 150/80, pulse 98, height 6' (1.829 m), weight 244 lb (110.7 kg), SpO2 98 %. Systems examined below as of 02/14/17  General: No apparent distress alert and oriented x3 mood and affect normal, dressed appropriately.  HEENT: Pupils equal, extraocular movements intact  Respiratory: Patient's speak in full sentences and does not appear short of breath  Cardiovascular: No lower extremity edema, non tender, no erythema  Skin: Warm dry intact with no signs of infection or rash on extremities or on axial skeleton.  Abdomen: Soft nontender  Neuro: Cranial nerves II through XII are intact, neurovascularly intact in all extremities with 2+ DTRs and 2+ pulses.  Lymph: No lymphadenopathy of posterior  or anterior cervical chain or axillae bilaterally.  Gait normal with good balance and coordination.  MSK: Diffuse tender with full range of motion and good stability and symmetric strength and tone of shoulders, elbows, wrist, hip, knee and ankles bilaterally.  Patient back exam still seems to be severely tender to palpation out of proportion to the amount of palpation.  Patient does have some mild limitation in side bending but otherwise actually has some increase in extension.  Negative straight leg test.  Negative Faber test.  More tenderness to palpation in the paraspinal musculature at the thoracolumbar junction bilaterally  Osteopathic findings C2 flexed rotated and side bent right C4 flexed rotated and side bent left C6 flexed rotated and side bent left T3 extended rotated and side bent right inhaled third rib T10 extended rotated and side bent right L1 flexed rotated and side bent right Sacrum right on right     Impression and Recommendations:     This case required medical decision making of moderate complexity.      Note: This dictation was prepared with Dragon dictation along with smaller phrase technology. Any transcriptional errors that result from this process are unintentional.

## 2017-02-14 ENCOUNTER — Encounter: Payer: Self-pay | Admitting: Physician Assistant

## 2017-02-14 ENCOUNTER — Ambulatory Visit (INDEPENDENT_AMBULATORY_CARE_PROVIDER_SITE_OTHER): Payer: 59 | Admitting: Family Medicine

## 2017-02-14 ENCOUNTER — Ambulatory Visit: Payer: 59 | Admitting: Physician Assistant

## 2017-02-14 ENCOUNTER — Other Ambulatory Visit: Payer: Self-pay

## 2017-02-14 ENCOUNTER — Encounter: Payer: Self-pay | Admitting: Family Medicine

## 2017-02-14 VITALS — BP 128/78 | HR 100 | Temp 98.0°F | Resp 18 | Ht 72.0 in | Wt 242.0 lb

## 2017-02-14 VITALS — BP 150/80 | HR 98 | Ht 72.0 in | Wt 244.0 lb

## 2017-02-14 DIAGNOSIS — M999 Biomechanical lesion, unspecified: Secondary | ICD-10-CM

## 2017-02-14 DIAGNOSIS — M546 Pain in thoracic spine: Secondary | ICD-10-CM

## 2017-02-14 DIAGNOSIS — G4719 Other hypersomnia: Secondary | ICD-10-CM | POA: Diagnosis not present

## 2017-02-14 DIAGNOSIS — G8929 Other chronic pain: Secondary | ICD-10-CM

## 2017-02-14 DIAGNOSIS — M4200 Juvenile osteochondrosis of spine, site unspecified: Secondary | ICD-10-CM

## 2017-02-14 DIAGNOSIS — M5134 Other intervertebral disc degeneration, thoracic region: Secondary | ICD-10-CM

## 2017-02-14 DIAGNOSIS — M40204 Unspecified kyphosis, thoracic region: Secondary | ICD-10-CM

## 2017-02-14 DIAGNOSIS — Z79891 Long term (current) use of opiate analgesic: Secondary | ICD-10-CM | POA: Diagnosis not present

## 2017-02-14 DIAGNOSIS — R7989 Other specified abnormal findings of blood chemistry: Secondary | ICD-10-CM

## 2017-02-14 DIAGNOSIS — G4701 Insomnia due to medical condition: Secondary | ICD-10-CM | POA: Diagnosis not present

## 2017-02-14 NOTE — Assessment & Plan Note (Signed)
We will continue to follow-up with endocrinology.  Found to have elevated FSH and increased Clomid recently.

## 2017-02-14 NOTE — Patient Instructions (Signed)
Good to see you  I will see if Dr. Amado Coeavid Sutton with PT will help  If manipulation helps lets keep doing it.  Otherwise I understand and you can send me messages and I ill help where I can  I like the massage I like the idea of removing medicine more then adding medicine I also want to see what happens when testosterone is fully fixed See me again in 5-6 weeks  Happy New Year!

## 2017-02-14 NOTE — Assessment & Plan Note (Signed)
Discussed with patient again with his chronic pain.  We have exhausted all imaging studies at this time and also follow any treatment options that I feel is viable for him.  I encouraged him to follow-up with the pain management physician to help with potential injections or good nerve stimulator that could be beneficial.  Patient brought up other different medications and I declined to prescribe any at this time and if anything I would like him to start discontinuing medications before he adds any.  Patient is already starting on short-term disability.  We discussed the possibility of formal physical therapy which patient was referred and see if this will be beneficial.

## 2017-02-14 NOTE — Progress Notes (Signed)
Patient ID: Frank [name], male    DOB: 12/30/1990, 27 y.o.   MRN: [id number]  PCP: Porfirio OarJeffery, Gianni Fuchs, PA-C  Chief Complaint  Patient presents with  . Short-Term Disability    Pt is here to discuss getting started on Short-Term Disability  . Medication Refill    Ambien 10 MG, Adderall XR 30 MG. Adderall 30 MG, Percocet    Subjective:   Presents for evaluation of his chronic back pain and insomnia. He is accompanied by his father.  He traveled to [place] with his family for the holiday. Developed bilateral AOM and influenza. Treated with amoxicillin and prednisone. Is feeling improved, still some intermittent coughing and increased fatigue.  Medications were authorized 02/09/2017. [place] for the next 3-4 months.  He is in a new job, and does not yet qualify for [employer]. His employer has advised that he take a leave of absence, due to missing work to attend to his medical problems. The hope is that more focused attention to his treatments can help get better control. He's going to try the implantable spinal stimulator, as part of a trial with [employer]. His psych evaluation is 03/06/2017. They plan is to begin either 1/20 or 1/28, and then take 12 weeks initially, with a return to work date of 05/28/2017.  He has not yet initiated the application for short term disability. He believes that his portion of the online application requires my input.  Dr. Ollen BowlHarkins has added Nucynta to his medications. He's on day of of a 20 day trial.  Last UDS was 10/13/2016, and results were as expected. [place]CC[place] reviewed. All prescriptions known to me.  Review of Systems No CP, SOB, HA, dizziness. Resolving URI symptoms. No fever, chills, nausea, vomiting.    Patient Active Problem List   Diagnosis Date Noted  . Low testosterone in male 09/29/2016  . 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 Chronic prescription opiate use  01/17/2016  . Scheuermann's disease 12/21/2015  . Nonallopathic lesion of thoracic region 12/21/2015  . Nonallopathic lesion of sacral region 12/21/2015  . Nonallopathic lesion of lumbosacral region 12/21/2015  . Myofascial pain 10/12/2014  . Kyphosis of thoracic region 10/02/2014  . Degeneration of intervertebral disc of thoracic region 10/02/2014  . Excessive daytime sleepiness 03/04/2014  . Patient overweight 03/04/2014  . Snoring 03/04/2014  . Insomnia 01/30/2012  . ADD (attention deficit disorder) 08/23/2011  . Acne 08/23/2011  . History of OCD (obsessive compulsive disorder) 08/23/2011  . BMI 38.0-38.9,adult 08/23/2011  . Narcolepsy 07/26/2011     Prior to Admission medications   Medication Sig Start Date End Date Taking? Authorizing Provider  albuterol (PROVENTIL HFA;VENTOLIN HFA) 108 (90 Base) MCG/ACT inhaler Inhale 2 puffs into the lungs as needed for wheezing or shortness of breath. 08/22/16  Yes Judi SaaSmith, Zachary M, DO  amphetamine-dextroamphetamine (ADDERALL XR) 30 MG 24 hr capsule Take 1 capsule (30 mg total) by mouth daily. 02/09/17  Yes Kesha Hurrell, PA-C  amphetamine-dextroamphetamine (ADDERALL) 30 MG tablet Take 1.5 tablets by mouth 2 (two) times daily. 02/09/17  Yes Tracee Mccreery, PA-C  oxyCODONE-acetaminophen (PERCOCET) 10-325 MG tablet Take 1-2 tablets by mouth every 8 (eight) hours as needed for pain. 02/09/17  Yes Theda Payer, PA-C  traZODone (DESYREL) 50 MG tablet Take 0.5-1 tablets (25-50 mg total) by mouth at bedtime as needed for sleep. 01/11/17  Yes Toure Edmonds, PA-C  Vitamin D, Ergocalciferol, (DRISDOL) 50000 units CAPS capsule Take  1 capsule (50,000 Units total) by mouth every 7 (seven) days. 01/11/17  Yes Juna Caban, PA-C  zolpidem (AMBIEN) 10 MG tablet Take 1 tablet (10 mg total) by mouth at bedtime as needed. 02/09/17  Yes Marvella Jenning, PA-C  clomiPHENE (CLOMID) 50 MG tablet TK 1/2 T PO D 01/15/17   [provider]     No Known  Allergies     Objective:  Physical Exam  Constitutional: He is oriented to person, place, and time. He appears well-developed and well-nourished. He is active and cooperative. No distress.  BP 128/78 (BP Location: Left Arm, Patient Position: Sitting, Cuff Size: Large)   Pulse 100   Temp 98 F (36.7 C) (Oral)   Resp 18   Ht 6' (1.829 m)   Wt 242 lb (109.8 kg)   SpO2 97%   BMI 32.82 kg/m   HENT:  Head: Normocephalic and atraumatic.  Right Ear: Hearing, external ear and ear canal normal. Tympanic membrane is injected.  Left Ear: Hearing, tympanic membrane, external ear and ear canal normal.  Eyes: Conjunctivae are normal. No scleral icterus.  Neck: Normal range of motion. Neck supple. No thyromegaly present.  Cardiovascular: Normal rate, regular rhythm and normal heart sounds.  Pulses:      Radial pulses are 2+ on the right side, and 2+ on the left side.  Pulmonary/Chest: Effort normal and breath sounds normal.  Lymphadenopathy:       Head (right side): No tonsillar, no preauricular, no posterior auricular and no occipital adenopathy present.       Head (left side): No tonsillar, no preauricular, no posterior auricular and no occipital adenopathy present.    He has no cervical adenopathy.       Right: No supraclavicular adenopathy present.       Left: No supraclavicular adenopathy present.  Neurological: He is alert and oriented to person, place, and time. No sensory deficit.  Skin: Skin is warm, dry and intact. No rash noted. No cyanosis or erythema. Nails show no clubbing.  Psychiatric: He has a normal mood and affect. His speech is normal and behavior is normal.       Assessment & Plan:   1. Degeneration of intervertebral disc of thoracic region 2. Kyphosis of thoracic region, unspecified kyphosis type 3. Nonallopathic lesion of thoracic region 4. Nonallopathic lesion of sacral region 5. Nonallopathic lesion of lumbosacral region 6. Scheuermann's disease 7. Insomnia due  to medical condition 8. Excessive daytime sleepiness 9. Chronic prescription opiate use - ToxASSURE Select 13 (MW), Urine  Patient will initiate the short-term disability application using the diagnoses as above, and talk with his employer again to clarify start date. He will send me the forms that I need to complete.  Return in about 4 weeks (around 03/14/2017) for re-evaluation of pain.   Fernande Bras, PA-C Primary Care at [facility]

## 2017-02-14 NOTE — Assessment & Plan Note (Signed)
Decision today to treat with OMT was based on Physical Exam  After verbal consent patient was treated with HVLA, ME, FPR techniques in cervical, thoracic, rib, lumbar and sacral areas  Patient tolerated the procedure well with improvement in symptoms  Patient given exercises, stretches and lifestyle modifications  See medications in patient instructions if given  Patient will follow up in 6 weeks 

## 2017-02-14 NOTE — Patient Instructions (Addendum)
Send me the forms when you get them.    IF you received an x-ray today, you will receive an invoice from [facility]. Please contact [facility] at [phone] with questions or concerns regarding your invoice.   IF you received labwork today, you will receive an invoice from [facility]. Please contact [facility] at [phone] with questions or concerns regarding your invoice.   Our billing staff will not be able to assist you with questions regarding bills from these companies.  You will be contacted with the lab results as soon as they are available. The fastest way to get your results is to activate your My Chart account. Instructions are located on the last page of this paperwork. If you have not heard from us regarding the results in 2 weeks, please contact this office.

## 2017-02-14 NOTE — Telephone Encounter (Signed)
Pt being seen today

## 2017-02-14 NOTE — Assessment & Plan Note (Signed)
Following up with primary care provider.

## 2017-02-15 ENCOUNTER — Telehealth: Payer: Self-pay | Admitting: Physician Assistant

## 2017-02-15 NOTE — Telephone Encounter (Signed)
Copied from CRM [phone]. Topic: General - Other >> Feb 15, 2017 12:53 PM Gerrianne ScalePayne, Angela L wrote: Reason for CRM: patient calling to see if [employer] has sent over his paper work if so give him a call just to let him know but h has paperwork that [employer] had sent him so he's going to fax that paper work just incase you didn't get first paper work from [employer]

## 2017-02-17 LAB — TOXASSURE SELECT 13 (MW), URINE

## 2017-02-20 ENCOUNTER — Encounter: Payer: Self-pay | Admitting: Physician Assistant

## 2017-02-20 NOTE — Telephone Encounter (Signed)
Pt checking on status of fmla paperwork  Please call [phone]

## 2017-02-20 NOTE — Telephone Encounter (Signed)
Patient needs FMLA forms completed. I am not sure what he is needing these forms for--so I left them blank I will forms in [name]'s CMA box down at 104. Please return to the FMLA/Disability desk within 5-7 business days. I will get them faxed and sent in for the patient. Thank you

## 2017-02-21 NOTE — Telephone Encounter (Signed)
Forms signed and returned to FMLA/Disablity desk

## 2017-02-21 NOTE — Telephone Encounter (Signed)
Paperwork scanned and faxed on 02/21/17 °

## 2017-03-12 ENCOUNTER — Other Ambulatory Visit: Payer: Self-pay | Admitting: Physician Assistant

## 2017-03-12 DIAGNOSIS — F988 Other specified behavioral and emotional disorders with onset usually occurring in childhood and adolescence: Secondary | ICD-10-CM

## 2017-03-12 DIAGNOSIS — G4700 Insomnia, unspecified: Secondary | ICD-10-CM

## 2017-03-12 DIAGNOSIS — G47429 Narcolepsy in conditions classified elsewhere without cataplexy: Secondary | ICD-10-CM

## 2017-03-12 NOTE — Telephone Encounter (Signed)
[facility] called back [facility] [address]

## 2017-03-13 ENCOUNTER — Telehealth: Payer: Self-pay | Admitting: Physician Assistant

## 2017-03-13 MED ORDER — OXYCODONE-ACETAMINOPHEN 10-325 MG PO TABS
1.0000 | ORAL_TABLET | Freq: Three times a day (TID) | ORAL | 0 refills | Status: DC | PRN
Start: 2017-03-13 — End: 2017-03-14

## 2017-03-13 MED ORDER — AMPHETAMINE-DEXTROAMPHETAMINE 30 MG PO TABS: 45.0000 mg | ORAL_TABLET | Freq: Two times a day (BID) | ORAL | 0 refills | Status: DC

## 2017-03-13 MED ORDER — ZOLPIDEM TARTRATE 10 MG PO TABS: 10.0000 mg | ORAL_TABLET | Freq: Every evening | ORAL | 0 refills | Status: DC | PRN

## 2017-03-13 MED ORDER — AMPHETAMINE-DEXTROAMPHET ER 30 MG PO CP24: 30.0000 mg | ORAL_CAPSULE | Freq: Every day | ORAL | 0 refills | Status: DC

## 2017-03-13 NOTE — Telephone Encounter (Signed)
Pls advise see note below  

## 2017-03-13 NOTE — Telephone Encounter (Signed)
Patients father called and said he really needs a call back from [place] in Medical Records, he said that [employer] has sent four other request with no response.  Call back is [phone]

## 2017-03-13 NOTE — Telephone Encounter (Signed)
Copied from CRM [phone]. Topic: Quick Communication - Rx Refill/Question >> Mar 13, 2017  2:08 PM [name], Frank Verdi, [name] wrote: Medication: Adderall 30mg , Adderall 1.5 tablets, Oxycodone, Zolpidem   Has the patient contacted their pharmacy? Yes  Patient calling because his medications were sent to the wrong pharmacy. Patient has been in contact with the new pharmacy and was told that he would have to call his doctor office to have the medications sent to them    Preferred Pharmacy (with phone number or street name): [facility] [address] [phone]   Agent: Please be advised that RX refills may take up to 3 business days. We ask that you follow-up with your pharmacy.

## 2017-03-13 NOTE — Telephone Encounter (Signed)
Pt requesting a change in his pharmacy. [facility] [address]   # [phone],  Not [facility]. Pt states all his medications went to the wrong pharmacy. He has been in contact with the new pharmacy and was told to call his pcp to have the meds sent there.

## 2017-03-13 NOTE — Telephone Encounter (Signed)
Please advise 

## 2017-03-14 ENCOUNTER — Encounter: Payer: Self-pay | Admitting: Physician Assistant

## 2017-03-14 ENCOUNTER — Ambulatory Visit (INDEPENDENT_AMBULATORY_CARE_PROVIDER_SITE_OTHER): Payer: 59 | Admitting: Physician Assistant

## 2017-03-14 ENCOUNTER — Other Ambulatory Visit: Payer: Self-pay

## 2017-03-14 VITALS — BP 120/66 | HR 92 | Temp 98.0°F | Resp 18 | Ht 72.0 in | Wt 237.8 lb

## 2017-03-14 DIAGNOSIS — G4701 Insomnia due to medical condition: Secondary | ICD-10-CM | POA: Diagnosis not present

## 2017-03-14 DIAGNOSIS — M7918 Myalgia, other site: Secondary | ICD-10-CM | POA: Diagnosis not present

## 2017-03-14 DIAGNOSIS — M5134 Other intervertebral disc degeneration, thoracic region: Secondary | ICD-10-CM

## 2017-03-14 DIAGNOSIS — F988 Other specified behavioral and emotional disorders with onset usually occurring in childhood and adolescence: Secondary | ICD-10-CM | POA: Diagnosis not present

## 2017-03-14 DIAGNOSIS — Z79891 Long term (current) use of opiate analgesic: Secondary | ICD-10-CM | POA: Diagnosis not present

## 2017-03-14 DIAGNOSIS — G4700 Insomnia, unspecified: Secondary | ICD-10-CM

## 2017-03-14 DIAGNOSIS — G47429 Narcolepsy in conditions classified elsewhere without cataplexy: Secondary | ICD-10-CM | POA: Diagnosis not present

## 2017-03-14 MED ORDER — AMPHETAMINE-DEXTROAMPHETAMINE 30 MG PO TABS: 45.0000 mg | ORAL_TABLET | Freq: Two times a day (BID) | ORAL | 0 refills | Status: DC

## 2017-03-14 MED ORDER — ZOLPIDEM TARTRATE 10 MG PO TABS: 10.0000 mg | ORAL_TABLET | Freq: Every evening | ORAL | 0 refills | Status: DC | PRN

## 2017-03-14 MED ORDER — OXYCODONE-ACETAMINOPHEN 10-325 MG PO TABS: 1.0000 | ORAL_TABLET | Freq: Three times a day (TID) | ORAL | 0 refills | Status: DC | PRN

## 2017-03-14 MED ORDER — AMPHETAMINE-DEXTROAMPHET ER 30 MG PO CP24: 30.0000 mg | ORAL_CAPSULE | Freq: Every day | ORAL | 0 refills | Status: DC

## 2017-03-14 NOTE — Progress Notes (Signed)
Patient ID: Frank [name], male    DOB: Feb 06, 1991, 27 y.o.   MRN: [id number]  PCP: Porfirio Oar, PA-C  Chief Complaint  Patient presents with  . Pain    Pt states pain is worse because he has been moving around more.  . Follow-up  . Medication Problem    Pt states medications were sent to the wrong pharmacy. [facility] [address] in [place]    Subjective:   Presents for evaluation of chronic pain, ADD and medication refills.  I received an electronic request for his controlled substances and authorized them yesterday. Because they were requested by his pharmacy in [place], that is where they were sent. He has moved back to [place], at least temporarily, and is now using a Insurance claims handler. He called the [facility] pharmacy to cancel the prescriptions sent there.  He continues to work with specialists to address the chronic, debilitating pain, currently pursuing an implantable device in the back. He has some anxiety about it, but is pursuing anything that may help at this point. Recall that he has taken a leave of absence from work in order to spend all his time and energy on this issue.  Has passed the psych evaluation. Had a very good experience with the provider. His parents want him to discuss the possibility of fibromyalgia, which they understand is often missed in men. Sees a new PT next week, and is considering working with a Systems analyst. His goal for this month is to get off the Adderall XR. He is using an alarm to remind him to take the immediate release, trying to keep him from crashing, which causes increased difficulty with concentration. Still not sleeping well.  Stress persists. Feels angry.  Continues to awaken frequently. Feels a numbness/tingling sensation in the neck and extending down both arms. When he crosses his legs too long, or sitting on the floor for more than a few minutes, he develops the same in the legs.  Endocrinology  has increased the Clomid dose to address low testosterone.    Review of Systems As above. No CP, SOB, HA, dizziness. No GI/GU symptoms. No new muscle or joint pain.    Patient Active Problem List   Diagnosis Date Noted  . Low testosterone in male 09/29/2016  . 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 Chronic prescription opiate use 01/17/2016  . Scheuermann's disease 12/21/2015  . Nonallopathic lesion of thoracic region 12/21/2015  . Nonallopathic lesion of sacral region 12/21/2015  . Nonallopathic lesion of lumbosacral region 12/21/2015  . Myofascial pain 10/12/2014  . Kyphosis of thoracic region 10/02/2014  . Degeneration of intervertebral disc of thoracic region 10/02/2014  . Excessive daytime sleepiness 03/04/2014  . Patient overweight 03/04/2014  . Snoring 03/04/2014  . Insomnia 01/30/2012  . ADD (attention deficit disorder) 08/23/2011  . Acne 08/23/2011  . History of OCD (obsessive compulsive disorder) 08/23/2011  . BMI 38.0-38.9,adult 08/23/2011  . Narcolepsy 07/26/2011     Prior to Admission medications   Medication Sig Start Date End Date Taking? Authorizing Provider  albuterol (PROVENTIL HFA;VENTOLIN HFA) 108 (90 Base) MCG/ACT inhaler Inhale 2 puffs into the lungs as needed for wheezing or shortness of breath. 08/22/16  Yes [name], DO  amphetamine-dextroamphetamine (ADDERALL XR) 30 MG 24 hr capsule Take 1 capsule (30 mg total) by mouth daily. 03/13/17  Yes Kelwin Gibler, PA-C  amphetamine-dextroamphetamine (ADDERALL) 30 MG tablet Take 1.5 tablets by  mouth 2 (two) times daily. 03/13/17  Yes Julianna Vanwagner, PA-C  clomiPHENE (CLOMID) 50 MG tablet TK 1 T PO D 01/15/17  Yes [provider]  NUCYNTA 75 MG tablet TK 1 T PO Q 4 TO 6 H PRN 01/29/17  Yes [provider]  oxyCODONE-acetaminophen (PERCOCET) 10-325 MG tablet Take 1-2 tablets by mouth every 8 (eight) hours as needed for pain. 03/13/17  Yes Brydan Downard, PA-C    traZODone (DESYREL) 50 MG tablet Take 0.5-1 tablets (25-50 mg total) by mouth at bedtime as needed for sleep. 01/11/17  Yes Lovely Kerins, PA-C  Vitamin D, Ergocalciferol, (DRISDOL) 50000 units CAPS capsule Take 1 capsule (50,000 Units total) by mouth every 7 (seven) days. 01/11/17  Yes Audra Kagel, PA-C  zolpidem (AMBIEN) 10 MG tablet Take 1 tablet (10 mg total) by mouth at bedtime as needed. 03/13/17  Yes Korby Ratay, PA-C     No Known Allergies     Objective:  Physical Exam  Constitutional: He is oriented to person, place, and time. He appears well-developed and well-nourished. He is active and cooperative. No distress.  BP 120/66 (BP Location: Left Arm, Patient Position: Sitting, Cuff Size: Large)   Pulse 92   Temp 98 F (36.7 C) (Oral)   Resp 18   Ht 6' (1.829 m)   Wt 237 lb 12.8 oz (107.9 kg)   SpO2 97%   BMI 32.25 kg/m   HENT:  Head: Normocephalic and atraumatic.  Right Ear: Hearing normal.  Left Ear: Hearing normal.  Eyes: Conjunctivae are normal. No scleral icterus.  Neck: Normal range of motion. Neck supple. No thyromegaly present.  Cardiovascular: Normal rate, regular rhythm and normal heart sounds.  Pulses:      Radial pulses are 2+ on the right side, and 2+ on the left side.  Pulmonary/Chest: Effort normal and breath sounds normal.  Musculoskeletal:  Tenderness at 6 of 18 points: RIGHT lower SCM, LEFT sternum, RIGHT knee medial fat pad, BILATERAL trapezius, LEFT upper outer buttock.  Lymphadenopathy:       Head (right side): No tonsillar, no preauricular, no posterior auricular and no occipital adenopathy present.       Head (left side): No tonsillar, no preauricular, no posterior auricular and no occipital adenopathy present.    He has no cervical adenopathy.       Right: No supraclavicular adenopathy present.       Left: No supraclavicular adenopathy present.  Neurological: He is alert and oriented to person, place, and time. No sensory deficit.   Skin: Skin is warm, dry and intact. No rash noted. No cyanosis or erythema. Nails show no clubbing.  Psychiatric: He has a normal mood and affect. His speech is normal and behavior is normal.      Assessment & Plan:   Problem List Items Addressed This Visit    Narcolepsy   Relevant Medications   zolpidem (AMBIEN) 10 MG tablet   ADD (attention deficit disorder) - Primary    Recommend working to reduce/eliminate the immediate release stimulant first, even consider increasing the XR dose if needed. Also read about Vyvanse and Mydayis as alternatives.      Relevant Medications   amphetamine-dextroamphetamine (ADDERALL XR) 30 MG 24 hr capsule   amphetamine-dextroamphetamine (ADDERALL) 30 MG tablet   Insomnia   Relevant Medications   zolpidem (AMBIEN) 10 MG tablet   Myofascial pain    Continue pursuit of implantable nerve stimulator. In the meantime, continue current opiate dose.  Degeneration of intervertebral disc of thoracic region    Continue pursuit of implantable nerve stimulator. In the meantime, continue current opiate dose.      Relevant Medications   oxyCODONE-acetaminophen (PERCOCET) 10-325 MG tablet   Chronic prescription opiate use    Continue pursuit of implantable nerve stimulator. In the meantime, continue current opiate dose.      Relevant Medications   oxyCODONE-acetaminophen (PERCOCET) 10-325 MG tablet       Return in about 4 weeks (around 04/11/2017) for re-evalaution of pain, insomnia.   Fernande Bras, PA-C Primary Care at [facility]

## 2017-03-14 NOTE — Assessment & Plan Note (Signed)
Recommend working to reduce/eliminate the immediate release stimulant first, even consider increasing the XR dose if needed. Also read about Vyvanse and Mydayis as alternatives.

## 2017-03-14 NOTE — Assessment & Plan Note (Signed)
Continue pursuit of implantable nerve stimulator. In the meantime, continue current opiate dose.

## 2017-03-14 NOTE — Assessment & Plan Note (Signed)
Continue pursuit of implantable nerve stimulator. In the meantime, continue current opiate dose. 

## 2017-03-14 NOTE — Patient Instructions (Addendum)
Look in to Vyvanse and Mydayis as alternatives to the Adderall XR. Try to eliminate the immediate release first, if you're trying to avoid the crashes. We could also consider increasing the Adderall XR dose, to see if that would help you reduce the immediate release doses.  I recommend Dr. Romero BellingSean Ellison or Dr. Ernest Haberhristina Gherghe for endocrinology here.    IF you received an x-ray today, you will receive an invoice from [facility]. Please contact [facility] at [phone] with questions or concerns regarding your invoice.   IF you received labwork today, you will receive an invoice from [facility]. Please contact [facility] at [phone] with questions or concerns regarding your invoice.   Our billing staff will not be able to assist you with questions regarding bills from these companies.  You will be contacted with the lab results as soon as they are available. The fastest way to get your results is to activate your My Chart account. Instructions are located on the last page of this paperwork. If you have not heard from us regarding the results in 2 weeks, please contact this office.

## 2017-03-14 NOTE — Telephone Encounter (Signed)
Addressed at OV today.

## 2017-03-19 NOTE — Telephone Encounter (Signed)
Patient was sent two emails on his private email account along with a mychart message telling him that his records were sent with no problem on 03/14/17.

## 2017-03-20 NOTE — Progress Notes (Signed)
Tawana ScaleZach Karesha Trzcinski D.O. [facility] [address] [place], [place] [address] Phone: [phone] Subjective:     CC: Back pain follow-up  BJY:NWGNFAOZHYHPI:Subjective  [name] is a 27 y.o. male coming in with complaint of neck pain.  History of Scheuermans disease.  Please see previous notes about this.  Found to have mild degenerative disease at L4-L5.  Has had significant difficulty.  Has had found to have low testosterone and sent to endocrinology.  Seen another provider and still attempting to get a implantable nerve stimulator.  Patient gets chronic pain medications from primary care provider.  Patient has been seen by me for more of chronic pain and osteopathic manipulation.  Patient states overall seems to be stable.  Not making significant progress.  States that he is going to be doing formal physical therapy for the next month.  Patient states that behavioral health has given him the go ahead to try a experimental external stimulator which may be placed in the next month depending on what type of results patient gets with more conservative therapy.  Patient is no longer doing any of the medications that we have prescribed over the course of time.  Patient feels like staying out of work at this time and focusing on himself will be the most beneficial.     Past Medical History:  Diagnosis Date  . ADD (attention deficit disorder)   . Anxiety    Past Surgical History:  Procedure Laterality Date  . KNEE SURGERY    . WISDOM TOOTH EXTRACTION  2013   Social History   Socioeconomic History  . Marital status: Single    Spouse name: None  . Number of children: None  . Years of education: None  . Highest education level: Bachelor's degree (e.g., BA, AB, BS)  Social Needs  . Financial resource strain: None  . Food insecurity - worry: None  . Food insecurity - inability: None  . Transportation needs - medical: None  . Transportation needs - non-medical: None  Occupational History   Employer: [employer]  Tobacco Use  . Smoking status: Never Smoker  . Smokeless tobacco: Never Used  Substance and Sexual Activity  . Alcohol use: Yes    Alcohol/week: 7.2 oz    Types: 7 Standard drinks or equivalent, 5 Shots of liquor per week  . Drug use: No  . Sexual activity: Yes  Other Topics Concern  . None  Social History Narrative   Caffeine 1-2 cups a week.   Moved back in with his parents from [place], [place] in 02/2017 to address his chronic pain and disability more aggressively (leave of absence from work).   No Known Allergies Family History  Problem Relation Age of Onset  . Stroke Maternal Grandmother   . Cancer Paternal Grandmother        Breast  . Hypertension Paternal Grandmother   . Diabetes Paternal Grandfather   . Prostate cancer Father        and grandfather     Past medical history, social, surgical and family history all reviewed in electronic medical record.  No pertanent information unless stated regarding to the chief complaint.   Review of Systems:Review of systems updated and as accurate as of 03/21/17  No headache, visual changes, nausea, vomiting, diarrhea, constipation, dizziness, abdominal pain, skin rash, fevers, chills, night sweats, weight loss, swollen lymph nodes, joint swelling, chest pain, shortness of breath, mood changes.  Positive muscle aches, body aches  Wt Readings from Last 3  Encounters:  03/21/17 240 lb (108.9 kg)  03/14/17 237 lb 12.8 oz (107.9 kg)  02/14/17 242 lb (109.8 kg)     Objective  Blood pressure 130/82, pulse 85, height 6' (1.829 m), weight 240 lb (108.9 kg), SpO2 97 %. Systems examined below as of 03/21/17   General: No apparent distress alert and oriented x3 mood and affect normal, dressed appropriately.  HEENT: Pupils equal, extraocular movements intact  Respiratory: Patient's speak in full sentences and does not appear short of breath  Cardiovascular: No lower extremity edema, non tender, no erythema  Skin: Warm dry  intact with no signs of infection or rash on extremities or on axial skeleton.  Abdomen: Soft nontender  Neuro: Cranial nerves II through XII are intact, neurovascularly intact in all extremities with 2+ DTRs and 2+ pulses.  Lymph: No lymphadenopathy of posterior or anterior cervical chain or axillae bilaterally.  Gait normal with good balance and coordination.  MSK: Moderate tender with full range of motion and good stability and symmetric strength and tone of shoulders, elbows, wrist, hip, knee and ankles bilaterally.  Back Exam:  Inspection: Mild loss of lordosis Motion: Flexion 45 deg, Extension 25 deg, Side Bending to 30 deg bilaterally,  Rotation to 35 deg bilaterally  SLR laying: Negative  XSLR laying: Negative  Palpable tenderness: Tender to palpation more in the thoracolumbar junction bilaterally.  No spinous process tenderness.  Mild pain over the right sacroiliac joint. FABER: Mild positive right. Sensory change: Gross sensation intact to all lumbar and sacral dermatomes.  Reflexes: 2+ at both patellar tendons, 2+ at achilles tendons, Babinski's downgoing.  Strength at foot  Plantar-flexion: 5/5 Dorsi-flexion: 5/5 Eversion: 5/5 Inversion: 5/5  Leg strength  Quad: 5/5 Hamstring: 5/5 Hip flexor: 5/5 Hip abductors: 5/5  Gait unremarkable.  Osteopathic findings C2 flexed rotated and side bent right C4 flexed rotated and side bent left C7 flexed rotated and side bent left T3 extended rotated and side bent right inhaled third rib T11 extended rotated and side bent left L1 flexed rotated and side bent right Sacrum right on right      Impression and Recommendations:     This case required medical decision making of moderate complexity.      Note: This dictation was prepared with Dragon dictation along with smaller phrase technology. Any transcriptional errors that result from this process are unintentional.

## 2017-03-21 ENCOUNTER — Encounter: Payer: Self-pay | Admitting: Family Medicine

## 2017-03-21 ENCOUNTER — Ambulatory Visit: Payer: 59 | Admitting: Family Medicine

## 2017-03-21 VITALS — BP 130/82 | HR 85 | Ht 72.0 in | Wt 240.0 lb

## 2017-03-21 DIAGNOSIS — M4200 Juvenile osteochondrosis of spine, site unspecified: Secondary | ICD-10-CM | POA: Diagnosis not present

## 2017-03-21 DIAGNOSIS — M999 Biomechanical lesion, unspecified: Secondary | ICD-10-CM

## 2017-03-21 NOTE — Assessment & Plan Note (Signed)
Patient is really having more of a chronic pain syndrome.  Discussed with patient in great length about icing regimen and home exercise.  We discussed stability.  Patient is getting chronic pain medications from primary care provider.  Seen in the other specialist and is considering the stimulator.  I think at this point this is a good idea.  Encouraged him to follow through with a formal physical therapy and hopefully that this will be beneficial.  Patient given different diet recommendations that I think would be beneficial to decrease inflammation.  Follow-up again in 4-6 weeks if manipulation continues to help.

## 2017-03-21 NOTE — Patient Instructions (Signed)
Good to see you  I am hoping we will see some improvement.  [place] is great  Tell them scheurmanns disease, tight hip flexors poor core strength  Consider dry needling.  Ice can help  For the foot do not lace the eye near the toes Still avoid being barefoot.  Would consider more omega 3 and less omega 6 foods.  See me again in 4-6 weeks if you feel this helps

## 2017-03-21 NOTE — Assessment & Plan Note (Signed)
Decision today to treat with OMT was based on Physical Exam  After verbal consent patient was treated with HVLA, ME, FPR techniques in cervical, thoracic, lumbar and sacral areas  Patient tolerated the procedure well with improvement in symptoms  Patient given exercises, stretches and lifestyle modifications  See medications in patient instructions if given  Patient will follow up in 4-6 weeks 

## 2017-03-26 ENCOUNTER — Encounter: Payer: Self-pay | Admitting: Family Medicine

## 2017-04-10 ENCOUNTER — Encounter: Payer: Self-pay | Admitting: Physician Assistant

## 2017-04-10 ENCOUNTER — Other Ambulatory Visit: Payer: Self-pay

## 2017-04-10 ENCOUNTER — Ambulatory Visit: Payer: 59 | Admitting: Physician Assistant

## 2017-04-10 VITALS — BP 118/70 | HR 88 | Temp 97.6°F | Resp 16 | Ht 72.0 in | Wt 238.0 lb

## 2017-04-10 DIAGNOSIS — G47429 Narcolepsy in conditions classified elsewhere without cataplexy: Secondary | ICD-10-CM

## 2017-04-10 DIAGNOSIS — F988 Other specified behavioral and emotional disorders with onset usually occurring in childhood and adolescence: Secondary | ICD-10-CM

## 2017-04-10 DIAGNOSIS — G4701 Insomnia due to medical condition: Secondary | ICD-10-CM | POA: Diagnosis not present

## 2017-04-10 DIAGNOSIS — R7989 Other specified abnormal findings of blood chemistry: Secondary | ICD-10-CM | POA: Diagnosis not present

## 2017-04-10 DIAGNOSIS — G4700 Insomnia, unspecified: Secondary | ICD-10-CM | POA: Diagnosis not present

## 2017-04-10 DIAGNOSIS — G4719 Other hypersomnia: Secondary | ICD-10-CM

## 2017-04-10 DIAGNOSIS — M7918 Myalgia, other site: Secondary | ICD-10-CM

## 2017-04-10 DIAGNOSIS — Z79891 Long term (current) use of opiate analgesic: Secondary | ICD-10-CM | POA: Diagnosis not present

## 2017-04-10 DIAGNOSIS — M5134 Other intervertebral disc degeneration, thoracic region: Secondary | ICD-10-CM | POA: Diagnosis not present

## 2017-04-10 MED ORDER — ZOLPIDEM TARTRATE 10 MG PO TABS: 10.0000 mg | ORAL_TABLET | Freq: Every evening | ORAL | 0 refills | Status: DC | PRN

## 2017-04-10 MED ORDER — AMPHETAMINE-DEXTROAMPHETAMINE 30 MG PO TABS: 45.0000 mg | ORAL_TABLET | Freq: Two times a day (BID) | ORAL | 0 refills | Status: DC

## 2017-04-10 MED ORDER — OXYCODONE-ACETAMINOPHEN 10-325 MG PO TABS: 1.0000 | ORAL_TABLET | Freq: Three times a day (TID) | ORAL | 0 refills | Status: DC | PRN

## 2017-04-10 NOTE — Progress Notes (Signed)
Patient ID: Frank [name], male    DOB: 12/19/1990, 27 y.o.   MRN: [id number]  PCP: Frank [name]  Chief Complaint  Patient presents with  . Follow-up    4wk f/u pain and insomnia  . Medication Refill    all meds    Subjective:   Presents for evaluation of chronic pain, resulting insomnia and mood. Last visit with Terrilee FilesZach Smith, DO 03/21/2017. Osteopathic manipulations. Next visit 04/18/2017. Last visit with neurosurgery (Dr. Norville HaggardHarkin, at [facility]), exploring an implantable nerve stimulator. External stimulator study remains pending. Next visit is 1-2 weeks. TIW workouts with a trainer. Has started Nycynta. "Dulls the spear" of the pain. PT starts formally today, at [facility]. Currently not working (leave of absence) in order to focus on getting healthy, as his situation had become unmanageable. Pain and paresthesias continue to disrupt sleep. Tries to naps during the day, but has the same during the daytime sleep. Pain is worst in the mornings, feels like he's trapped in concrete. Takes about 2 hours to "ease up." He has applied for disability, and expects denial, so will need to change insurance.  Has reduced Adderall dose. Has stopped the XR formulation, and notes a "crash" 4-5 hours after each IR dose. Parents report that he is less irritable, more resilient, without the XR. He Is increasing caffeine.  Needs to establish with a local endocrinologist, to continue Clomid for low testosterone. He is living with his parents in [place] presently, and isn't able to drive to [place] for visits.   Review of Systems Constitutional: Positive for activity change (Increased physical training on purpose since last visit. ), appetite change (Lots of appetite increase since decreasing adderall.), diaphoresis and fatigue. Negative for chills and unexpected weight change.  HENT: Positive for tinnitus. Negative for congestion, hearing loss, sinus pressure and  sinus pain.   Eyes: Negative.  Negative for photophobia, pain and itching.  Respiratory: Negative.  Negative for cough, chest tightness and shortness of breath.   Cardiovascular: Negative.  Negative for chest pain and palpitations.  Gastrointestinal: Negative.  Negative for abdominal distention, abdominal pain, constipation, diarrhea, nausea and vomiting.  Musculoskeletal: Positive for arthralgias, back pain, gait problem, joint swelling, myalgias, neck pain and neck stiffness.       "My whole body hurts."  Neurological: Positive for tremors and numbness ("Shooting nerve pain that goes down legs and causes numbness in leg/feet. Occasional numbness and pain in pinky, ring and index fingers bilaterally."). Negative for dizziness, weakness and light-headedness.  Psychiatric/Behavioral: Positive for sleep disturbance. Negative for dysphoric mood (Improving.).     [facility] 04/10/2017 03/14/2017 10/13/2016 01/17/2016 04/24/2015  Decreased Interest 0 0 0 0 0  Down, Depressed, Hopeless 0 0 0 0 0  PHQ - 2 Score 0 0 0 0 0  Altered sleeping - - - - -  Tired, decreased energy - - - - -  Change in appetite - - - - -  Feeling bad or failure about yourself  - - - - -  Trouble concentrating - - - - -  Moving slowly or fidgety/restless - - - - -  Suicidal thoughts - - - - -  PHQ-9 Score - - - - -  Difficult doing work/chores - - - - -    Patient Active Problem List   Diagnosis Date Noted  . Low testosterone in male 09/29/2016  . 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 Chronic  prescription opiate use 01/17/2016  . Scheuermann's disease 12/21/2015  . Nonallopathic lesion of thoracic region 12/21/2015  . Nonallopathic lesion of sacral region 12/21/2015  . Nonallopathic lesion of lumbosacral region 12/21/2015  . Myofascial pain 10/12/2014  . Kyphosis of thoracic region 10/02/2014  . Degeneration of intervertebral disc of thoracic region 10/02/2014  . Excessive daytime  sleepiness 03/04/2014  . Patient overweight 03/04/2014  . Snoring 03/04/2014  . Insomnia 01/30/2012  . ADD (attention deficit disorder) 08/23/2011  . Acne 08/23/2011  . History of OCD (obsessive compulsive disorder) 08/23/2011  . BMI 38.0-38.9,adult 08/23/2011  . Narcolepsy 07/26/2011     Prior to Admission medications   Medication Sig Start Date End Date Taking? Authorizing Provider  albuterol (PROVENTIL HFA;VENTOLIN HFA) 108 (90 Base) MCG/ACT inhaler Inhale 2 puffs into the lungs as needed for wheezing or shortness of breath. 08/22/16  Yes [name], DO  amphetamine-dextroamphetamine (ADDERALL XR) 30 MG 24 hr capsule Take 1 capsule (30 mg total) by mouth daily. 03/14/17  Yes Zeniah Briney, [name]  amphetamine-dextroamphetamine (ADDERALL) 30 MG tablet Take 1.5 tablets by mouth 2 (two) times daily. 03/14/17  Yes Jasaiah Karwowski, [name]  clomiPHENE (CLOMID) 50 MG tablet Tk 1 pill a day 01/15/17  Yes [provider]  NUCYNTA 75 MG tablet TK 1 T PO Q 4 TO 6 H PRN 01/29/17  Yes [provider]  oxyCODONE-acetaminophen (PERCOCET) 10-325 MG tablet Take 1-2 tablets by mouth every 8 (eight) hours as needed for pain. 03/14/17  Yes Cartier Mapel, [name]  traZODone (DESYREL) 50 MG tablet Take 0.5-1 tablets (25-50 mg total) by mouth at bedtime as needed for sleep. 01/11/17  Yes Mariadejesus Cade, [name]  Vitamin D, Ergocalciferol, (DRISDOL) 50000 units CAPS capsule Take 1 capsule (50,000 Units total) by mouth every 7 (seven) days. 01/11/17  Yes Avneet Ashmore, [name]  zolpidem (AMBIEN) 10 MG tablet Take 1 tablet (10 mg total) by mouth at bedtime as needed. 03/14/17  Yes Kaitlynd Phillips, [name]     No Known Allergies     Objective:  Physical Exam  Constitutional: He is oriented to person, place, and time. He appears well-developed and well-nourished. He is active and cooperative. No distress.  BP 118/70   Pulse 88   Temp 97.6 F (36.4 C)   Resp 16   Ht 6' (1.829 m)   Wt 238 lb (108  kg)   SpO2 98%   BMI 32.28 kg/m   HENT:  Head: Normocephalic and atraumatic.  Right Ear: Hearing normal.  Left Ear: Hearing normal.  Eyes: Conjunctivae are normal. No scleral icterus.  Neck: Normal range of motion. Neck supple. No thyromegaly present.  Cardiovascular: Normal rate, regular rhythm and normal heart sounds.  Pulses:      Radial pulses are 2+ on the right side, and 2+ on the left side.  Pulmonary/Chest: Effort normal and breath sounds normal.  Musculoskeletal: Normal range of motion. He exhibits tenderness (generalized). He exhibits no edema or deformity.  Lymphadenopathy:       Head (right side): No tonsillar, no preauricular, no posterior auricular and no occipital adenopathy present.       Head (left side): No tonsillar, no preauricular, no posterior auricular and no occipital adenopathy present.    He has no cervical adenopathy.       Right: No supraclavicular adenopathy present.       Left: No supraclavicular adenopathy present.  Neurological: He is alert and oriented to person, place, and time. No sensory deficit.  Skin:  Skin is warm, dry and intact. No rash noted. No cyanosis or erythema. Nails show no clubbing.  Psychiatric: He has a normal mood and affect. His speech is normal and behavior is normal.           Assessment & Plan:   Problem List Items Addressed This Visit    Narcolepsy    Stable. Continue current treatment.      Relevant Medications   zolpidem (AMBIEN) 10 MG tablet   ADD (attention deficit disorder)    He has eliminated the XR formulation of Adderall. Will continue working to reduce the IR doses. Encouraged him NOT to replace it with other stimulants (caffeine).      Relevant Medications   amphetamine-dextroamphetamine (ADDERALL) 30 MG tablet   Insomnia    Due to pain. Reduce stimulants (Adderall and caffeine).      Relevant Medications   zolpidem (AMBIEN) 10 MG tablet   Excessive daytime sleepiness   Myofascial pain     Continue current treatment, including medications, osteopathic manipulations, physical training, PT and proceed with external stimulator study in hopes that success will allow for implanted stimulator.      Degeneration of intervertebral disc of thoracic region - Primary   Relevant Medications   oxyCODONE-acetaminophen (PERCOCET) 10-325 MG tablet   Chronic prescription opiate use    COntinue efforts to reduce need for opiates.      Relevant Medications   oxyCODONE-acetaminophen (PERCOCET) 10-325 MG tablet   Low testosterone in male   Relevant Orders   Ambulatory referral to Endocrinology       Return in about 3 weeks (around 05/01/2017).   Fernande Bras, [name]

## 2017-04-10 NOTE — Progress Notes (Signed)
Subjective:    Patient ID: Frank [name], male    DOB: 12-Nov-1990, 27 y.o.   MRN: [id number]  Chief Complaint  Patient presents with  . Follow-up    4wk f/u pain and insomnia  . Medication Refill    all meds    HPI Patient is coming in for a 4 week follow up on his back pain and insomnia. Patient has had degeneration of intervertebral discs and kyphosis of his thoracic region since 2016. He has had chronic back pain in his lower, mid, and upper back since prior to that time. The pain is severe enough to wake him every night after 3-4 hours of sleep. He takes Percocet and Nucynta for the pain but claims that the medications only 'dull the spear' not take it away. The pain is worst in the morning. Patient claims he wakes up feeling "trapped in cement" and it takes two hours after waking up (after he has taking his medications,) for him to feel free. Associated symptoms include numbness and paresthesias that radiate down both legs and both arms into the pinky, ring and index fingers.   Patient wakes up nightly after 3-4 hours of sleep due to pain and paresthesias. He takes Ambien and Trazadone, which enable sleep onset but do not improve sleep maintenance. He takes the trazadone nightly and the Ambien as needed.   Patient has been trying to decrease his dose of Aderrall in an attempt to decrease muscle tightness and pain. He has completely eliminated the Aderrall XR from his medication regimen but still used the instant release. He experiences a 'crash' after 4-5 hours of medication onset. He has been increasing his caffeine intake to 4-5 cups of coffee a day in an attempt to combat his increased fatigue.   Patient has been attending training 3 times a week for the past two weeks. Today (04/10/2017,) is his evaluation with PT at [facility]. He is hoping the PT evaluation will be able to guide his training sessions. He is currently on a medical leave from work, living with his parents. At this  time he is able to prioritize "getting better." Patient is seeing Dr. Norville Haggard at [facility], with whom he may start a 7 day external nerve-stimulator trial within the next month.   Patient has an endocrinologist in [place] that manages his low Testosterone with Clomid. Patient would like a referral to a local endocrinologist.  He saw Dr. Katrinka Blazing (DO,) last on 03/21/2017 for myofascial and osteopathic manipulations. His next scheduled appointment with him is 04/18/2017.  Patient Active Problem List   Diagnosis Date Noted  . Low testosterone in male 09/29/2016  . 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 Chronic prescription opiate use 01/17/2016  . Scheuermann's disease 12/21/2015  . Nonallopathic lesion of thoracic region 12/21/2015  . Nonallopathic lesion of sacral region 12/21/2015  . Nonallopathic lesion of lumbosacral region 12/21/2015  . Myofascial pain 10/12/2014  . Kyphosis of thoracic region 10/02/2014  . Degeneration of intervertebral disc of thoracic region 10/02/2014  . Excessive daytime sleepiness 03/04/2014  . Patient overweight 03/04/2014  . Snoring 03/04/2014  . Insomnia 01/30/2012  . ADD (attention deficit disorder) 08/23/2011  . Acne 08/23/2011  . History of OCD (obsessive compulsive disorder) 08/23/2011  . BMI 38.0-38.9,adult 08/23/2011  . Narcolepsy 07/26/2011   No Known Allergies   Prior to Admission medications   Medication Sig Start Date End Date Taking? Authorizing Provider  amphetamine-dextroamphetamine (ADDERALL XR) 30  MG 24 hr capsule Take 1 capsule (30 mg total) by mouth daily. 03/14/17  Yes Jeffery, [name], PA-C  amphetamine-dextroamphetamine (ADDERALL) 30 MG tablet Take 1.5 tablets by mouth 2 (two) times daily. 04/10/17  Yes Jeffery, [name], PA-C  clomiPHENE (CLOMID) 50 MG tablet Tk 1 pill a day 01/15/17  Yes [provider]  NUCYNTA 75 MG tablet TK 1 T PO Q 4 TO 6 H PRN 01/29/17  Yes [provider]    oxyCODONE-acetaminophen (PERCOCET) 10-325 MG tablet Take 1-2 tablets by mouth every 8 (eight) hours as needed for pain. 04/10/17  Yes Jeffery, [name], PA-C  traZODone (DESYREL) 50 MG tablet Take 0.5-1 tablets (25-50 mg total) by mouth at bedtime as needed for sleep. 01/11/17  Yes Jeffery, [name], PA-C  Vitamin D, Ergocalciferol, (DRISDOL) 50000 units CAPS capsule Take 1 capsule (50,000 Units total) by mouth every 7 (seven) days. 01/11/17  Yes Jeffery, [name], PA-C  zolpidem (AMBIEN) 10 MG tablet Take 1 tablet (10 mg total) by mouth at bedtime as needed. 04/10/17  Yes Porfirio Oar, PA-C    Past Medical History:  Diagnosis Date  . ADD (attention deficit disorder)   . Anxiety   . Narcolepsy through school   Social History   Socioeconomic History  . Marital status: Single    Spouse name: Not on file  . Number of children: Not on file  . Years of education: Not on file  . Highest education level: Bachelor's degree (e.g., BA, AB, BS)  Social Needs  . Financial resource strain: Not on file  . Food insecurity - worry: Not on file  . Food insecurity - inability: Not on file  . Transportation needs - medical: Not on file  . Transportation needs - non-medical: Not on file  Occupational History    Employer: [employer]  Tobacco Use  . Smoking status: Never Smoker  . Smokeless tobacco: Never Used  Substance and Sexual Activity  . Alcohol use: Yes    Alcohol/week: 7.2 oz    Types: 5 Shots of liquor, 7 Standard drinks or equivalent per week    Comment: Mostly just weekends  . Drug use: No  . Sexual activity: Yes    Birth control/protection: Condom  Other Topics Concern  . Not on file  Social History Narrative   Caffeine 4-5 cups a day. Decreasing adderall has increased need for coffee.   Moved back in with his parents from [place], [place] in 02/2017 to address his chronic pain and disability more aggressively (leave of absence from work).   Family History  Problem Relation Age of Onset  .  Stroke Maternal Grandmother   . Cancer Paternal Grandmother        Breast  . Hypertension Paternal Grandmother   . Diabetes Paternal Grandfather   . Prostate cancer Father        and grandfather   Past Surgical History:  Procedure Laterality Date  . KNEE SURGERY    . WISDOM TOOTH EXTRACTION  2013    Review of Systems  Constitutional: Positive for activity change (Increased physical training on purpose since last visit. ), appetite change (Lots of appetite increase since decreasing adderall.), diaphoresis and fatigue. Negative for chills and unexpected weight change.  HENT: Positive for tinnitus. Negative for congestion, hearing loss, sinus pressure and sinus pain.   Eyes: Negative.  Negative for photophobia, pain and itching.  Respiratory: Negative.  Negative for cough, chest tightness and shortness of breath.   Cardiovascular: Negative.  Negative for chest pain and  palpitations.  Gastrointestinal: Negative.  Negative for abdominal distention, abdominal pain, constipation, diarrhea, nausea and vomiting.  Musculoskeletal: Positive for arthralgias, back pain, gait problem, joint swelling, myalgias, neck pain and neck stiffness.       "My whole body hurts."  Neurological: Positive for tremors and numbness ("Shooting nerve pain that goes down legs and causes numbness in leg/feet. Occasional numbness and pain in pinky, ring and index fingers bilaterally."). Negative for dizziness, weakness and light-headedness.  Psychiatric/Behavioral: Positive for sleep disturbance. Negative for dysphoric mood (Improving.).       Objective:   Physical Exam  Constitutional: He is oriented to person, place, and time. He appears well-developed and well-nourished.  BP 118/70   Pulse 88   Temp 97.6 F (36.4 C)   Resp 16   Ht 6' (1.829 m)   Wt 238 lb (108 kg)   SpO2 98%   BMI 32.28 kg/m   HENT:  Head: Normocephalic and atraumatic.  Eyes: Conjunctivae and EOM are normal. Pupils are equal, round, and  reactive to light.  Neck: Normal range of motion. Neck supple.  Cardiovascular: Normal rate, regular rhythm, normal heart sounds and intact distal pulses. Exam reveals no gallop and no friction rub.  No murmur heard. Pulmonary/Chest: Effort normal and breath sounds normal.  Musculoskeletal: Normal range of motion. He exhibits tenderness (Pain is constant.).  Neurological: He is alert and oriented to person, place, and time. He has normal reflexes.  Skin: Skin is warm and dry. No rash noted. He is not diaphoretic. No erythema. No pallor.  Psychiatric: He has a normal mood and affect. His behavior is normal. Judgment and thought content normal.   Filed Weights   04/10/17 0920  Weight: 238 lb (108 kg)      Assessment & Plan:   1. Degeneration of intervertebral disc of thoracic region Continue with PT and training 3 times a week. Continue to take medication for pain management as needed. Continue decreasing Aderrall dose to decrease muscle tension and pain. Follow up with Dr. Norville Haggard for nerve stimulator trial participation.   2. Myofascial pain Pain requires Percocet and Nucynta for management. Pain is severe enough to wake patient up 3-4 hours after falling asleep. Sleep disturbance and decreasing of Adderall dose has increased patient's fatigue. Pain is accompanied by paresthesias and numbness in his legs, feet and pinky, ring and index fingers of both hands. Patient has had osteopathic manipulations with Dr. Terrilee Files, DO. His last appointment was on 03/21/2017 and his next one is 04/18/2017. Patient is following up for thoracic disc generation with Dr. Norville Haggard of [facility].   3. Insomnia due to medical condition Currently managed with nightly Trazodone and Ambien as needed. Patient has no problem falling asleep, but continues to wake up after 3-4 hours with pain and paresthesias. Patient is on a medical leave from work right now so it trying to nap during the day. Even  naps are disturbed by pain and paresthesia. Patient is prioritizing correcting the pain (as mentioned above,) in order to decrease insomnia.   4. Excessive daytime sleepiness Patient's decreased dose of Adderall and lack of quality sleep at night has compounded his daytime sleepiness. Patient is using 4-5 cups of caffeine daily, but remains excessively tired. Patient is looking into other ways to increase his energy during the day.  5. Chronic prescription opiate use Patient is up to date on his toxicology screen (last done on 02/14/2017.) He uses his Percocet as directed and has added  Nucynta to his regimen in an effort to lower his Percocet dose. Patient has decreased his Adderall dose, started PT, does osteopathic manipulations, and will be participating in a patient trial in an effort to decrease his pain and wean off chronic opiate use. He is motivated to decrease his pain through other methods.   - oxyCODONE-acetaminophen (PERCOCET) 10-325 MG tablet; Take 1-2 tablets by mouth every 8 (eight) hours as needed for pain.  Dispense: 90 tablet; Refill: 0  6. Low testosterone in male Patient is currently taking Clomid to regulate his FSH and testosterone levels. He is currently being managed by an endocrinologist in [place], but wants a referral to one closer to his parents home where he is living now.   - Ambulatory referral to Endocrinology   7. Narcolepsy due to underlying condition without cataplexy  Patient is currently decreasing Adderall dosage in an effort to decrease his chronic pain and muscle tension. He notices a crash 4-5 hours after taking his instant-release Adderall and has completely eliminated the extended-release Adderall from his medication regimen. He has been supplementing his decrease in Adderall with 4-5 cups of coffee daily. Patient takes Trazodone nightly to sleep and Ambien as needed.   - zolpidem (AMBIEN) 10 MG tablet; Take 1 tablet (10 mg total) by mouth at bedtime as  needed.  Dispense: 30 tablet; Refill: 0  8. Insomnia, unspecified type Patient is waking up every 3-4 hours with pain and paresthesias. He is taking Trazodone nightly and Ambien prn for sleep initiation. His sleep maintenance is still an issue. He is prioritizing pain management (as described above,) in order to eliminate the root cause of his insomnia.  - zolpidem (AMBIEN) 10 MG tablet; Take 1 tablet (10 mg total) by mouth at bedtime as needed.  Dispense: 30 tablet; Refill: 0  9. Attention deficit disorder, unspecified hyperactivity presence Patient is currently decreasing Adderall dosage in an effort to decrease his chronic pain and muscle tension. He notices a crash 4-5 hours after taking his instant-release Adderall and has completely eliminated the extended-release Adderall from his medication regimen. He has been supplementing his decrease in Adderall with 4-5 cups of coffee daily. The decrease in Adderall dose has helped him stop hyper-focusing on his pain. This will be re-evaluated at his next follow up.  - amphetamine-dextroamphetamine (ADDERALL) 30 MG tablet; Take 1.5 tablets by mouth 2 (two) times daily.  Dispense: 90 tablet; Refill: 0  Return in about 3 weeks (around 05/01/2017).

## 2017-04-10 NOTE — Patient Instructions (Addendum)
Try to reduce the other stimulant (caffeine).    IF you received an x-ray today, you will receive an invoice from [facility]. Please contact [facility] at [phone] with questions or concerns regarding your invoice.   IF you received labwork today, you will receive an invoice from [facility]. Please contact [facility] at [phone] with questions or concerns regarding your invoice.   Our billing staff will not be able to assist you with questions regarding bills from these companies.  You will be contacted with the lab results as soon as they are available. The fastest way to get your results is to activate your My Chart account. Instructions are located on the last page of this paperwork. If you have not heard from us regarding the results in 2 weeks, please contact this office.

## 2017-04-11 NOTE — Assessment & Plan Note (Signed)
Continue current treatment, including medications, osteopathic manipulations, physical training, PT and proceed with external stimulator study in hopes that success will allow for implanted stimulator.

## 2017-04-11 NOTE — Assessment & Plan Note (Signed)
He has eliminated the XR formulation of Adderall. Will continue working to reduce the IR doses. Encouraged him NOT to replace it with other stimulants (caffeine).

## 2017-04-11 NOTE — Assessment & Plan Note (Signed)
Stable.  Continue current treatment

## 2017-04-11 NOTE — Assessment & Plan Note (Signed)
Due to pain. Reduce stimulants (Adderall and caffeine).

## 2017-04-11 NOTE — Assessment & Plan Note (Signed)
COntinue efforts to reduce need for opiates.

## 2017-04-17 NOTE — Progress Notes (Signed)
[name] [address] [place], [place] [address] Phone: [phone] Subjective:      CC: Back pain follow-up  [id number]  Frank [name] is a 27 y.o. male coming in with complaint of back pain. Scheurmann's disease.  Please see previous note for more in-depth information.  Chart was reviewed in its entirety.  Continues to have significant amount of back pain.  States that it is disabling him from regular functioning and has taken a leave of absence from his job.  Continues to follow-up with Dr. Ollen Bowl and is working at getting a potential deep nerve stimulator.  Seen psychology.  Patient in the process of attempting to get disability.  Patient is still on chronic narcotics that is prescribed by his primary care provider as well as nycenta.  Working with physical therapy and a Systems analyst 3 days a week now. Patient is also being followed by endocrinology for low testosterone.  Patient is in the process of changing to a different endocrinologist.  Patient states that he has been working with a physical therapist once a week and a personal trainer 3 times a week. Patient states that he is in constant pain when working with these professionals.      Past Medical History:  Diagnosis Date  . ADD (attention deficit disorder)   . Anxiety   . Narcolepsy through school   Past Surgical History:  Procedure Laterality Date  . KNEE SURGERY    . WISDOM TOOTH EXTRACTION  2013   Social History   Socioeconomic History  . Marital status: Single    Spouse name: None  . Number of children: None  . Years of education: None  . Highest education level: Bachelor's degree (e.g., BA, AB, BS)  Social Needs  . Financial resource strain: None  . Food insecurity - worry: None  . Food insecurity - inability: None  . Transportation needs - medical: None  . Transportation needs - non-medical: None  Occupational History    Employer: [employer]  Tobacco Use  . Smoking  status: Never Smoker  . Smokeless tobacco: Never Used  Substance and Sexual Activity  . Alcohol use: Yes    Alcohol/week: 7.2 oz    Types: 5 Shots of liquor, 7 Standard drinks or equivalent per week    Comment: Mostly just weekends  . Drug use: No  . Sexual activity: Yes    Birth control/protection: Condom  Other Topics Concern  . None  Social History Narrative   Caffeine 4-5 cups a day. Decreasing adderall has increased need for coffee.   Moved back in with his parents from [place], [place] in 02/2017 to address his chronic pain and disability more aggressively (leave of absence from work).   No Known Allergies Family History  Problem Relation Age of Onset  . Stroke Maternal Grandmother   . Cancer Paternal Grandmother        Breast  . Hypertension Paternal Grandmother   . Diabetes Paternal Grandfather   . Prostate cancer Father        and grandfather     Past medical history, social, surgical and family history all reviewed in electronic medical record.  No pertanent information unless stated regarding to the chief complaint.   Review of Systems:Review of systems updated and as accurate as of 04/18/17  No headache, visual changes, nausea, vomiting, diarrhea, constipation, dizziness, abdominal pain, skin rash, fevers, chills, night sweats, weight loss, swollen lymph nodes, body aches, joint swelling,chest pain, shortness  of breath, mood changes.  Positive muscle aches  Objective  Blood pressure 120/70, pulse 70, height 6' (1.829 m), weight 238 lb (108 kg), SpO2 98 %. Systems examined below as of 04/18/17   General: No apparent distress alert and oriented x3 mood and affect normal, dressed appropriately.  HEENT: Pupils equal, extraocular movements intact  Respiratory: Patient's speak in full sentences and does not appear short of breath  Cardiovascular: No lower extremity edema, non tender, no erythema  Skin: Warm dry intact with no signs of infection or rash on extremities or on  axial skeleton.  Abdomen: Soft nontender  Neuro: Cranial nerves II through XII are intact, neurovascularly intact in all extremities with 2+ DTRs and 2+ pulses.  Lymph: No lymphadenopathy of posterior or anterior cervical chain or axillae bilaterally.  Gait normal with good balance and coordination.  MSK:  Non tender with full range of motion and good stability and symmetric strength and tone of shoulders, elbows, wrist, hip, knee and ankles bilaterally.  Back pain is still there.  Patient has some hypermobility in extension normal range of motion otherwise.  Patient does have a mild positive Pearlean BrownieFaber on the right side which is new.  Negative straight leg test.  Diffuse tenderness to palpation in the paraspinal musculature mostly around the lumbosacral area in the thoracic lumbar area.   Osteopathic findings C2 flexed rotated and side bent right C4 flexed rotated and side bent left C7 flexed rotated and side bent left T8 extended rotated and side bent right inhaled rib L1 flexed rotated and side bent right Sacrum right on right     Impression and Recommendations:     This case required medical decision making of moderate complexity.      Note: This dictation was prepared with Dragon dictation along with smaller phrase technology. Any transcriptional errors that result from this process are unintentional.

## 2017-04-18 ENCOUNTER — Ambulatory Visit (INDEPENDENT_AMBULATORY_CARE_PROVIDER_SITE_OTHER): Payer: 59 | Admitting: Family Medicine

## 2017-04-18 ENCOUNTER — Encounter: Payer: Self-pay | Admitting: Family Medicine

## 2017-04-18 VITALS — BP 120/70 | HR 70 | Ht 72.0 in | Wt 238.0 lb

## 2017-04-18 DIAGNOSIS — M4200 Juvenile osteochondrosis of spine, site unspecified: Secondary | ICD-10-CM | POA: Diagnosis not present

## 2017-04-18 DIAGNOSIS — M999 Biomechanical lesion, unspecified: Secondary | ICD-10-CM

## 2017-04-18 NOTE — Assessment & Plan Note (Signed)
Decision today to treat with OMT was based on Physical Exam  After verbal consent patient was treated with HVLA, ME, FPR techniques in cervical, thoracic, lumbar and sacral areas  Patient tolerated the procedure well with improvement in symptoms  Patient given exercises, stretches and lifestyle modifications  See medications in patient instructions if given  Patient will follow up in 4 weeks 

## 2017-04-18 NOTE — Patient Instructions (Signed)
Good to see you  Keep it up  I do see some improvement! I think [place] has been good for you  We will get the orthotics.  See me again in 4 weeks

## 2017-04-18 NOTE — Assessment & Plan Note (Signed)
Continues to have pain.  Unfortunately workup has not shown any significant pathology that would likely contribute for this.  Patient continues to have pain that is affecting daily activities.    Patient is working with a trainer up to 4 times a week.  We discussed icing regimen and home exercises.  Discussed continuing some of the vitamin supplementations.  Patient responds well to osteopathic manipulation will continue as long as it continues to be helpful.  Follow-up with me again in 4 weeks

## 2017-05-03 ENCOUNTER — Ambulatory Visit (INDEPENDENT_AMBULATORY_CARE_PROVIDER_SITE_OTHER): Payer: 59 | Admitting: Physician Assistant

## 2017-05-03 ENCOUNTER — Encounter: Payer: Self-pay | Admitting: Physician Assistant

## 2017-05-03 ENCOUNTER — Other Ambulatory Visit: Payer: Self-pay

## 2017-05-03 VITALS — BP 128/78 | HR 95 | Temp 97.7°F | Resp 16 | Ht 72.0 in | Wt 239.8 lb

## 2017-05-03 DIAGNOSIS — R7989 Other specified abnormal findings of blood chemistry: Secondary | ICD-10-CM | POA: Diagnosis not present

## 2017-05-03 DIAGNOSIS — M5134 Other intervertebral disc degeneration, thoracic region: Secondary | ICD-10-CM | POA: Diagnosis not present

## 2017-05-03 DIAGNOSIS — F988 Other specified behavioral and emotional disorders with onset usually occurring in childhood and adolescence: Secondary | ICD-10-CM

## 2017-05-03 DIAGNOSIS — G4719 Other hypersomnia: Secondary | ICD-10-CM

## 2017-05-03 MED ORDER — BUPROPION HCL ER (XL) 150 MG PO TB24: 150.0000 mg | ORAL_TABLET | Freq: Every day | ORAL | 3 refills | Status: DC

## 2017-05-03 NOTE — Patient Instructions (Addendum)
Please call Dr. George HughEllison's office to schedule an appointment: [phone]    IF you received an x-ray today, you will receive an invoice from [facility]. Please contact [facility] at [phone] with questions or concerns regarding your invoice.   IF you received labwork today, you will receive an invoice from [facility]. Please contact [facility] at [phone] with questions or concerns regarding your invoice.   Our billing staff will not be able to assist you with questions regarding bills from these companies.  You will be contacted with the lab results as soon as they are available. The fastest way to get your results is to activate your My Chart account. Instructions are located on the last page of this paperwork. If you have not heard from us regarding the results in 2 weeks, please contact this office.

## 2017-05-03 NOTE — Progress Notes (Signed)
Patient ID: Frank PallSamuel J [name], male    DOB: 11/27/1990, 27 y.o.   MRN: [id number]  PCP: Porfirio OarJeffery, Audra Bellard, PA-C  Chief Complaint  Patient presents with  . Back Pain    follow up     Subjective:   Presents for evaluation of chronic back pain, ADD.  Has seen Dr. Katrinka BlazingSmith and Dr. Norville HaggardHarkin since his last visit with me. Seeing improvements in core strengthening, and so deferring re-evaluation for possible surgery.  Adderall has been on backorder, so he's reduced the dose. Hasn't needed it as much, since he isn't currently working. 15-30 mg total daily dose, usually not taking it on the weekends. Is better at not increasing other stimulants (caffeine), and is drinking more water. Feels like he's eating way too much. Food is comfort.  Working out is going well, though it causes significant muscle spasm and fatigue. Can't sit or bend in the afternoons after his sessions.  Has never tried bupropion. His mood is significantly altered by the chronic pain and lack of sleep. Addressing the pain and the pain itself, consume his life. Currently not working, in order to focus 100% on efforts to minimize his pain.    Review of Systems As above.    Patient Active Problem List   Diagnosis Date Noted  . Low testosterone in male 09/29/2016  . 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 Chronic prescription opiate use 01/17/2016  . Scheuermann's disease 12/21/2015  . Nonallopathic lesion of thoracic region 12/21/2015  . Nonallopathic lesion of sacral region 12/21/2015  . Nonallopathic lesion of lumbosacral region 12/21/2015  . Myofascial pain 10/12/2014  . Kyphosis of thoracic region 10/02/2014  . Degeneration of intervertebral disc of thoracic region 10/02/2014  . Excessive daytime sleepiness 03/04/2014  . Patient overweight 03/04/2014  . Snoring 03/04/2014  . Insomnia 01/30/2012  . ADD (attention deficit disorder) 08/23/2011  . Acne 08/23/2011  . History of OCD  (obsessive compulsive disorder) 08/23/2011  . BMI 38.0-38.9,adult 08/23/2011  . Narcolepsy 07/26/2011     Prior to Admission medications   Medication Sig Start Date End Date Taking? Authorizing Provider  amphetamine-dextroamphetamine (ADDERALL) 30 MG tablet Take 1.5 tablets by mouth 2 (two) times daily. 04/10/17  Yes Zakk Borgen, PA-C  clomiPHENE (CLOMID) 50 MG tablet Tk 1 pill a day 01/15/17  Yes [provider]  NUCYNTA 75 MG tablet TK 1 T PO Q 4 TO 6 H PRN 01/29/17  Yes [provider]  oxyCODONE-acetaminophen (PERCOCET) 10-325 MG tablet Take 1-2 tablets by mouth every 8 (eight) hours as needed for pain. 04/10/17  Yes Alia Parsley, PA-C  traZODone (DESYREL) 50 MG tablet Take 0.5-1 tablets (25-50 mg total) by mouth at bedtime as needed for sleep. 01/11/17  Yes Owen Pratte, PA-C  Vitamin D, Ergocalciferol, (DRISDOL) 50000 units CAPS capsule Take 1 capsule (50,000 Units total) by mouth every 7 (seven) days. 01/11/17  Yes Davyn Elsasser, PA-C  zolpidem (AMBIEN) 10 MG tablet Take 1 tablet (10 mg total) by mouth at bedtime as needed. 04/10/17  Yes Deniece Rankin, PA-C     No Known Allergies     Objective:  Physical Exam  Constitutional: He is oriented to person, place, and time. He appears well-developed and well-nourished. He is active and cooperative. No distress.  BP 128/78   Pulse 95   Temp 97.7 F (36.5 C)   Resp 16   Ht 6' (1.829 m)   Wt 239 lb 12.8 oz (108.8 kg)  SpO2 97%   BMI 32.52 kg/m   HENT:  Head: Normocephalic and atraumatic.  Right Ear: Hearing normal.  Left Ear: Hearing normal.  Eyes: Conjunctivae are normal. No scleral icterus.  Neck: Normal range of motion. Neck supple. No thyromegaly present.  Cardiovascular: Normal rate, regular rhythm and normal heart sounds.  Pulses:      Radial pulses are 2+ on the right side, and 2+ on the left side.  Pulmonary/Chest: Effort normal and breath sounds normal.  Lymphadenopathy:       Head  (right side): No tonsillar, no preauricular, no posterior auricular and no occipital adenopathy present.       Head (left side): No tonsillar, no preauricular, no posterior auricular and no occipital adenopathy present.    He has no cervical adenopathy.       Right: No supraclavicular adenopathy present.       Left: No supraclavicular adenopathy present.  Neurological: He is alert and oriented to person, place, and time. No sensory deficit.  Skin: Skin is warm, dry and intact. No rash noted. No cyanosis or erythema. Nails show no clubbing.  Psychiatric: He has a normal mood and affect. His speech is normal and behavior is normal.    Wt Readings from Last 3 Encounters:  05/21/17 240 lb (108.9 kg)  05/03/17 239 lb 12.8 oz (108.8 kg)  04/18/17 238 lb (108 kg)        Assessment & Plan:   Problem List Items Addressed This Visit    ADD (attention deficit disorder)    Encouraged continued efforts to reduce stimulants (both prescribed, and OTC, i.e. caffeine), which should in turn reduce insomnia. Trial of bupropion.      Relevant Medications   buPROPion (WELLBUTRIN XL) 150 MG 24 hr tablet   Excessive daytime sleepiness    Thought likely due to insomnia and chronic pain. History of narcolepsy. Trial of bupropion.      Relevant Medications   buPROPion (WELLBUTRIN XL) 150 MG 24 hr tablet   Degeneration of intervertebral disc of thoracic region - Primary    Continue current treatment with PT, trainer, specialty care.      Low testosterone in male    Referral to local endocrinologist          Return in about 1 month (around 05/31/2017) for Re-evaluation of mood, attention, fatigue, pain.   Fernande Bras, PA-C Primary Care at [facility]

## 2017-05-14 ENCOUNTER — Encounter: Payer: Self-pay | Admitting: Physician Assistant

## 2017-05-14 ENCOUNTER — Other Ambulatory Visit: Payer: Self-pay | Admitting: Physician Assistant

## 2017-05-14 DIAGNOSIS — G47429 Narcolepsy in conditions classified elsewhere without cataplexy: Secondary | ICD-10-CM

## 2017-05-14 DIAGNOSIS — Z79891 Long term (current) use of opiate analgesic: Secondary | ICD-10-CM

## 2017-05-14 DIAGNOSIS — F988 Other specified behavioral and emotional disorders with onset usually occurring in childhood and adolescence: Secondary | ICD-10-CM

## 2017-05-14 DIAGNOSIS — G4700 Insomnia, unspecified: Secondary | ICD-10-CM

## 2017-05-15 MED ORDER — OXYCODONE-ACETAMINOPHEN 10-325 MG PO TABS: 1.0000 | ORAL_TABLET | Freq: Three times a day (TID) | ORAL | 0 refills | Status: DC | PRN

## 2017-05-15 MED ORDER — AMPHETAMINE-DEXTROAMPHETAMINE 30 MG PO TABS: 45.0000 mg | ORAL_TABLET | Freq: Two times a day (BID) | ORAL | 0 refills | Status: DC

## 2017-05-15 MED ORDER — ZOLPIDEM TARTRATE 10 MG PO TABS: 10.0000 mg | ORAL_TABLET | Freq: Every evening | ORAL | 0 refills | Status: DC | PRN

## 2017-05-15 NOTE — Telephone Encounter (Signed)
Patient is requesting a refill of the following medications: Requested Prescriptions   Pending Prescriptions Disp Refills  . zolpidem (AMBIEN) 10 MG tablet 30 tablet 0    Sig: Take 1 tablet (10 mg total) by mouth at bedtime as needed.  Marland Kitchen. oxyCODONE-acetaminophen (PERCOCET) 10-325 MG tablet 90 tablet 0    Sig: Take 1-2 tablets by mouth every 8 (eight) hours as needed for pain.  Marland Kitchen. amphetamine-dextroamphetamine (ADDERALL) 30 MG tablet 90 tablet 0    Sig: Take 1.5 tablets by mouth 2 (two) times daily.    Date of patient request: 05/14/17 Last office visit: 05/03/17 Date of last refill: 04/10/17 Last refill amount: Am-#30 0RF, Add-#90 0RF, Perc-#90 0RF Follow up time period per chart: 06/05/17

## 2017-05-15 NOTE — Telephone Encounter (Signed)
Please Advise.....  Patient is requesting a refill of the following medications: Requested Prescriptions   Pending Prescriptions Disp Refills  . zolpidem (AMBIEN) 10 MG tablet 30 tablet 0    Sig: Take 1 tablet (10 mg total) by mouth at bedtime as needed.  Marland Kitchen. oxyCODONE-acetaminophen (PERCOCET) 10-325 MG tablet 90 tablet 0    Sig: Take 1-2 tablets by mouth every 8 (eight) hours as needed for pain.  Marland Kitchen. amphetamine-dextroamphetamine (ADDERALL) 30 MG tablet 90 tablet 0    Sig: Take 1.5 tablets by mouth 2 (two) times daily.    Date of patient request: 05/14/17 Last office visit: 05/03/17 Date of last refill: 04/10/2017 Last refill amount: 90-0,90-0,30-0 Follow up time period per chart: 1 month

## 2017-05-17 ENCOUNTER — Encounter: Payer: Self-pay | Admitting: Physician Assistant

## 2017-05-20 NOTE — Progress Notes (Signed)
[name] [address] [place], [place] [address] Phone: [phone] Subjective:    I'm seeing this patient by the request  of:  Porfirio Oar, PA-C   CC: Chronic back pain follow-up  [id number]  Frank [name] is a 27 y.o. male coming in with complaint of chronic back pain.  Please see previous note for more in-depth information.  Patient has been continuing working with physical therapist and Systems analyst.  Patient is still on chronic narcotics given by primary care provider.  Continues to see pain management as well to discuss the stimulator but is attempting to try to hold off on this if possible.  Patient states that unfortunately continues to have significant muscle spasms and fatigue.  Patient states that he is not sleeping well. His pain is worse with movement. He is taking a generic Wellbutrin.    Patient continues known to have chronic pain.  States that it seems to be a severe overall.  Still does not feel that he can work at this time.  Resultant chronic foot pain.  Been wearing custom orthotics.  Wants another pair.  Past Medical History:  Diagnosis Date  . ADD (attention deficit disorder)   . Anxiety   . Narcolepsy through school   Past Surgical History:  Procedure Laterality Date  . KNEE SURGERY    . WISDOM TOOTH EXTRACTION  2013   Social History   Socioeconomic History  . Marital status: Single    Spouse name: Not on file  . Number of children: 0  . Years of education: Not on file  . Highest education level: Bachelor's degree (e.g., BA, AB, BS)  Occupational History    Employer: [employer]  Social Needs  . Financial resource strain: Not on file  . Food insecurity:    Worry: Not on file    Inability: Not on file  . Transportation needs:    Medical: Not on file    Non-medical: Not on file  Tobacco Use  . Smoking status: Never Smoker  . Smokeless tobacco: Never Used  Substance and Sexual Activity  . Alcohol use:  Yes    Alcohol/week: 7.2 oz    Types: 5 Shots of liquor, 7 Standard drinks or equivalent per week    Comment: Mostly just weekends  . Drug use: No  . Sexual activity: Yes    Birth control/protection: Condom  Lifestyle  . Physical activity:    Days per week: Not on file    Minutes per session: Not on file  . Stress: Not on file  Relationships  . Social connections:    Talks on phone: Not on file    Gets together: Not on file    Attends religious service: Not on file    Active member of club or organization: Not on file    Attends meetings of clubs or organizations: Not on file    Relationship status: Not on file  Other Topics Concern  . Not on file  Social History Narrative   Caffeine 4-5 cups a day. Decreasing adderall has increased need for coffee.   Moved back in with his parents from [place], [place] in 02/2017 to address his chronic pain and disability more aggressively (leave of absence from work).   No Known Allergies Family History  Problem Relation Age of Onset  . Stroke Maternal Grandmother   . Cancer Paternal Grandmother        Breast  . Hypertension Paternal Grandmother   .  Diabetes Paternal Grandfather   . Prostate cancer Father        and grandfather     Past medical history, social, surgical and family history all reviewed in electronic medical record.  No pertanent information unless stated regarding to the chief complaint.   Review of Systems:Review of systems updated and as accurate as of 05/21/17  No headache, visual changes, nausea, vomiting, diarrhea, constipation, dizziness, abdominal pain, skin rash, fevers, chills, night sweats, weight loss, swollen lymph nodes,  joint swelling, chest pain, shortness of breath, mood changes.  Positive muscle aches, body aches    Objective  Blood pressure 122/70, pulse 82, height 6' (1.829 m), weight 240 lb (108.9 kg), SpO2 98 %. Systems examined below as of 05/21/17   General: No apparent distress alert and oriented  x3 mood and affect normal, dressed appropriately.  HEENT: Pupils equal, extraocular movements intact  Respiratory: Patient's speak in full sentences and does not appear short of breath  Cardiovascular: No lower extremity edema, non tender, no erythema  Skin: Warm dry intact with no signs of infection or rash on extremities or on axial skeleton.  Abdomen: Soft nontender  Neuro: Cranial nerves II through XII are intact, neurovascularly intact in all extremities with 2+ DTRs and 2+ pulses.  Lymph: No lymphadenopathy of posterior or anterior cervical chain or axillae bilaterally.  Gait normal with good balance and coordination.  MSK:  Non tender with full range of motion and good stability and symmetric strength and tone of shoulders, elbows, wrist, hip, knee and ankles bilaterally.  Back Exam:  Inspection: Mild loss of lordosis.  Still not great core strength Motion: Flexion 45 deg, Extension 25 deg, Side Bending to 35 deg bilaterally, Rotation to 35 deg bilaterally  SLR laying: Negative  XSLR laying: Negative  Palpable tenderness: Tender to palpation the paraspinal musculature lumbar spine right greater than left. FABER: negative. Sensory change: Gross sensation intact to all lumbar and sacral dermatomes.  Reflexes: 2+ at both patellar tendons, 2+ at achilles tendons, Babinski's downgoing.  Strength at foot  Plantar-flexion: 5/5 Dorsi-flexion: 5/5 Eversion: 5/5 Inversion: 5/5  Leg strength  Quad: 5/5 Hamstring: 5/5 Hip flexor: 5/5 Hip abductors: 5/5  Gait unremarkable.     Procedure Note   Patient was fitted for a : standard, cushioned, semi-rigid orthotic. The orthotic was heated and afterward the patient patient seated position and molded The patient was positioned in subtalar neutral position and 10 degrees of ankle dorsiflexion in a weight bearing stance. After completion of molding, patient did have orthotic management The blank was ground to a stable position for weight  bearing. Size:10.5  Base: Carbon fiber Additional Posting and Padding: Lateral wedges noted.  Increased metatarsal pad The patient ambulated these, and they were very comfortable.  Impression and Recommendations:     This case required medical decision making of moderate complexity.      Note: This dictation was prepared with Dragon dictation along with smaller phrase technology. Any transcriptional errors that result from this process are unintentional.

## 2017-05-21 ENCOUNTER — Ambulatory Visit (INDEPENDENT_AMBULATORY_CARE_PROVIDER_SITE_OTHER): Payer: 59 | Admitting: Family Medicine

## 2017-05-21 ENCOUNTER — Encounter: Payer: Self-pay | Admitting: Family Medicine

## 2017-05-21 VITALS — BP 122/70 | HR 82 | Ht 72.0 in | Wt 240.0 lb

## 2017-05-21 DIAGNOSIS — M999 Biomechanical lesion, unspecified: Secondary | ICD-10-CM

## 2017-05-21 DIAGNOSIS — M216X1 Other acquired deformities of right foot: Secondary | ICD-10-CM | POA: Diagnosis not present

## 2017-05-21 DIAGNOSIS — M4200 Juvenile osteochondrosis of spine, site unspecified: Secondary | ICD-10-CM | POA: Diagnosis not present

## 2017-05-21 NOTE — Assessment & Plan Note (Signed)
Decision today to treat with OMT was based on Physical Exam  After verbal consent patient was treated with HVLA, ME, FPR techniques in cervical, thoracic, lumbar and sacral areas  Patient tolerated the procedure well with improvement in symptoms  Patient given exercises, stretches and lifestyle modifications  See medications in patient instructions if given  Patient will follow up in 4 weeks 

## 2017-05-21 NOTE — Patient Instructions (Signed)
Good to see you  Have them to continue to work on hip abductors, Psoas muscles and lats seem to be the worse today  Got orthotics for you  I like the functional exercises they are giving you and will continue them  Continue with the high protein  See me again in 6 weeks

## 2017-05-21 NOTE — Assessment & Plan Note (Signed)
Thought likely due to insomnia and chronic pain. History of narcolepsy. Trial of bupropion.

## 2017-05-21 NOTE — Assessment & Plan Note (Signed)
Patient has been working diligently for quite some time now.  Patient though imaging has not shown any specific findings.  Has been going with a physical therapist as well as a Systems analystpersonal trainer full-time at this moment 5-6 times a week.  Patient is not working at the time secondary to pain.  Patient is on multiple different medications including narcotics from primary care provider as well as seen a pain management.  Still holding on a stimulator at this moment.  We are continuing to attempt osteopathic manipulation to try to help with any muscle imbalances and pain relief.  Discussed though any further workup as likely will not show any other findings.  See patient again in 6 weeks

## 2017-05-21 NOTE — Assessment & Plan Note (Addendum)
Encouraged continued efforts to reduce stimulants (both prescribed, and OTC, i.e. caffeine), which should in turn reduce insomnia. Trial of bupropion.

## 2017-05-21 NOTE — Assessment & Plan Note (Signed)
Continue current treatment with PT, trainer, specialty care.

## 2017-05-21 NOTE — Assessment & Plan Note (Signed)
Referral to local endocrinologist

## 2017-05-26 NOTE — Progress Notes (Signed)
  Procedure Note   Patient was fitted for a : standard, cushioned, semi-rigid orthotic. The orthotic was heated and afterward the patient patient seated position and molded The patient was positioned in subtalar neutral position and 10 degrees of ankle dorsiflexion in a weight bearing stance. After completion of molding, patient did have orthotic management The blank was ground to a stable position for weight bearing. Size: 11 Base: Carbon fiber Additional Posting and Padding: Left and right medial: 270/90 Transverse 250/35 x2  The patient ambulated these, and they were very comfortable.

## 2017-05-28 ENCOUNTER — Encounter: Payer: Self-pay | Admitting: Family Medicine

## 2017-05-28 ENCOUNTER — Ambulatory Visit (INDEPENDENT_AMBULATORY_CARE_PROVIDER_SITE_OTHER): Payer: 59 | Admitting: Family Medicine

## 2017-05-28 DIAGNOSIS — G5761 Lesion of plantar nerve, right lower limb: Secondary | ICD-10-CM | POA: Diagnosis not present

## 2017-05-28 NOTE — Assessment & Plan Note (Signed)
Fitted in custom orthotics.  Discussed icing regimen and home exercises.  Discussed wearing the slowly over the course of next several days.  Patient will follow up with me again in 4-6 weeks.

## 2017-05-29 ENCOUNTER — Ambulatory Visit: Payer: 59 | Admitting: Endocrinology

## 2017-06-05 ENCOUNTER — Encounter: Payer: Self-pay | Admitting: Physician Assistant

## 2017-06-05 ENCOUNTER — Ambulatory Visit: Payer: 59 | Admitting: Physician Assistant

## 2017-06-05 ENCOUNTER — Other Ambulatory Visit: Payer: Self-pay

## 2017-06-05 VITALS — BP 132/74 | HR 76 | Temp 98.1°F | Resp 16 | Ht 72.0 in | Wt 237.0 lb

## 2017-06-05 DIAGNOSIS — F988 Other specified behavioral and emotional disorders with onset usually occurring in childhood and adolescence: Secondary | ICD-10-CM

## 2017-06-05 DIAGNOSIS — G4719 Other hypersomnia: Secondary | ICD-10-CM | POA: Diagnosis not present

## 2017-06-05 DIAGNOSIS — M7918 Myalgia, other site: Secondary | ICD-10-CM

## 2017-06-05 DIAGNOSIS — G4700 Insomnia, unspecified: Secondary | ICD-10-CM | POA: Diagnosis not present

## 2017-06-05 DIAGNOSIS — Z79891 Long term (current) use of opiate analgesic: Secondary | ICD-10-CM | POA: Diagnosis not present

## 2017-06-05 MED ORDER — BUPROPION HCL ER (XL) 300 MG PO TB24: 300.0000 mg | ORAL_TABLET | Freq: Every day | ORAL | 1 refills | Status: DC

## 2017-06-05 MED ORDER — OXYCODONE-ACETAMINOPHEN 10-325 MG PO TABS: 1.0000 | ORAL_TABLET | Freq: Three times a day (TID) | ORAL | 0 refills | Status: DC | PRN

## 2017-06-05 MED ORDER — ZOLPIDEM TARTRATE 10 MG PO TABS: 10.0000 mg | ORAL_TABLET | Freq: Every evening | ORAL | 0 refills | Status: DC | PRN

## 2017-06-05 NOTE — Progress Notes (Signed)
Patient ID: Frank [name], male    DOB: Apr 16, 1990, 27 y.o.   MRN: [id number]  PCP: Porfirio Oar, PA-C  Chief Complaint  Patient presents with  . Fatigue    follow up , still exhausted not sleeping   . Pain    follow up, pain is worse   . ADD    follow up     Subjective:   Presents for evaluation of chronic pain, fatigue, insomnia, mood.  At his last visit, we added bupropion, in hopes of improving his mood, helping with the ADD so he could reduce stimulant therapy and caffeine. Initially he felt worse. Describes a general "awful" feeling. "I couldn't get out of my head, wet blanket feeling, like weighted down, not myself." Those resolved, thinks he's not any better than he was before starting it.  Can fall asleep, but cannot stay asleep, due to the pain. Takes 1-2 30-60 minute naps/day.  Following a PT session, he has such pain that he cannot get comfortable. Much more fit. Has improved strength and endurance. "Very difficult mental battle to get up and do it again." Increasing anxiety about pain, worried that it will occur when he is out, traveling, etc. Withdrawing more from family, "tired of being the sad sack."  Has only taken Adderall a couple of times, has a hard time completing the simplest of tasks, and is worried about functioning when he returns to work. Has also tried cutting back on caffeine, in attempt to reduce stimulant treatment in general.  Previous diagnosis of narcolepsy, "level one," with a sleep study. Was falling asleep in class, anytime he was not adequately stimulated.  Using a portable TENS unit, seems to be helping the soreness and spasm, but not the pain.   Review of Systems As above.  Depression screen [facility] 2/9 06/05/2017 05/03/2017 04/10/2017 03/14/2017 10/13/2016  Decreased Interest 0 0 0 0 0  Down, Depressed, Hopeless 0 0 0 0 0  PHQ - 2 Score 0 0 0 0 0  Altered sleeping - - - - -  Tired, decreased energy - - - - -  Change in  appetite - - - - -  Feeling bad or failure about yourself  - - - - -  Trouble concentrating - - - - -  Moving slowly or fidgety/restless - - - - -  Suicidal thoughts - - - - -  PHQ-9 Score - - - - -  Difficult doing work/chores - - - - -     Patient Active Problem List   Diagnosis Date Noted  . Low testosterone in male 09/29/2016  . 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 Chronic prescription opiate use 01/17/2016  . Scheuermann's disease 12/21/2015  . Nonallopathic lesion of thoracic region 12/21/2015  . Nonallopathic lesion of sacral region 12/21/2015  . Nonallopathic lesion of lumbosacral region 12/21/2015  . Myofascial pain 10/12/2014  . Kyphosis of thoracic region 10/02/2014  . Degeneration of intervertebral disc of thoracic region 10/02/2014  . Excessive daytime sleepiness 03/04/2014  . Patient overweight 03/04/2014  . Snoring 03/04/2014  . Insomnia 01/30/2012  . ADD (attention deficit disorder) 08/23/2011  . Acne 08/23/2011  . History of OCD (obsessive compulsive disorder) 08/23/2011  . BMI 38.0-38.9,adult 08/23/2011  . Narcolepsy 07/26/2011     Prior to Admission medications   Medication Sig Start Date End Date Taking? Authorizing Provider  amphetamine-dextroamphetamine (ADDERALL) 30 MG tablet Take 1.5 tablets by mouth 2 (two) times daily.  05/15/17  Yes Kalifa Cadden, PA-C  buPROPion (WELLBUTRIN XL) 150 MG 24 hr tablet Take 1 tablet (150 mg total) by mouth daily. 05/03/17  Yes Aalivia Mcgraw, PA-C  clomiPHENE (CLOMID) 50 MG tablet Tk 1 pill a day 01/15/17  Yes [provider]  NUCYNTA 75 MG tablet TK 1 T PO Q 4 TO 6 H PRN 01/29/17  Yes [provider]  oxyCODONE-acetaminophen (PERCOCET) 10-325 MG tablet Take 1-2 tablets by mouth every 8 (eight) hours as needed for pain. 05/15/17  Yes Porfiria Heinrich, PA-C  traZODone (DESYREL) 50 MG tablet Take 0.5-1 tablets (25-50 mg total) by mouth at bedtime as needed for sleep. 01/11/17  Yes Reilley Valentine,  Aydn Ferrara, PA-C  Vitamin D, Ergocalciferol, (DRISDOL) 50000 units CAPS capsule Take 1 capsule (50,000 Units total) by mouth every 7 (seven) days. 01/11/17  Yes Sajid Ruppert, PA-C  zolpidem (AMBIEN) 10 MG tablet Take 1 tablet (10 mg total) by mouth at bedtime as needed. 05/15/17  Yes Pawan Knechtel, PA-C     No Known Allergies     Objective:  Physical Exam  Constitutional: He is oriented to person, place, and time. He appears well-developed and well-nourished. He is active and cooperative. No distress.  BP 132/74   Pulse 76   Temp 98.1 F (36.7 C)   Resp 16   Ht 6' (1.829 m)   Wt 237 lb (107.5 kg)   SpO2 96%   BMI 32.14 kg/m    HENT:  Head: Normocephalic and atraumatic.  Right Ear: Hearing normal.  Left Ear: Hearing normal.  Eyes: Conjunctivae are normal. No scleral icterus.  Neck: Normal range of motion. Neck supple. No thyromegaly present.  Cardiovascular: Normal rate, regular rhythm and normal heart sounds.  Pulses:      Radial pulses are 2+ on the right side, and 2+ on the left side.  Pulmonary/Chest: Effort normal and breath sounds normal.  Musculoskeletal:       Cervical back: Normal.       Thoracic back: Normal.       Lumbar back: He exhibits tenderness and pain. He exhibits no bony tenderness.  Lymphadenopathy:       Head (right side): No tonsillar, no preauricular, no posterior auricular and no occipital adenopathy present.       Head (left side): No tonsillar, no preauricular, no posterior auricular and no occipital adenopathy present.    He has no cervical adenopathy.       Right: No supraclavicular adenopathy present.       Left: No supraclavicular adenopathy present.  Neurological: He is alert and oriented to person, place, and time. No sensory deficit.  Skin: Skin is warm, dry and intact. No rash noted. No cyanosis or erythema. Nails show no clubbing.  Psychiatric: He has a normal mood and affect. His speech is normal and behavior is normal.             Assessment & Plan:   Problem List Items Addressed This Visit    ADD (attention deficit disorder)    Increase bupropion from 150 mg to 300 mg.  Continue Adderall as needed with goal to eliminate stimulant therapy.      Relevant Medications   buPROPion (WELLBUTRIN XL) 300 MG 24 hr tablet   Insomnia    Persists, primarily due to pain.  Continue zolpidem.      Relevant Medications   zolpidem (AMBIEN) 10 MG tablet   Excessive daytime sleepiness    Persists.  Worse with reducing stimulants.  Increase bupropion from 150 mg to 300 mg.  Would consider Nuvigil if this is ineffective given his history of narcolepsy.      Relevant Medications   buPROPion (WELLBUTRIN XL) 300 MG 24 hr tablet   Myofascial pain - Primary    Continue with physical therapy, increased exercise, follow-up with Dr. Ayesha MohairZack Smith and pain management.      Relevant Medications   oxyCODONE-acetaminophen (PERCOCET) 10-325 MG tablet   Chronic prescription opiate use    Continue efforts to manage pain in hopes of reducing opiate use.          Return in about 1 month (around 07/03/2017) for re-evalaution of mood, pain, insomnia, fatigue.   Fernande Brashelle S. Silas Sedam, PA-C Primary Care at [facility]

## 2017-06-05 NOTE — Patient Instructions (Signed)
     IF you received an x-ray today, you will receive an invoice from [facility]. Please contact  Radiology at [phone] with questions or concerns regarding your invoice.   IF you received labwork today, you will receive an invoice from [facility]. Please contact [facility] at [phone] with questions or concerns regarding your invoice.   Our billing staff will not be able to assist you with questions regarding bills from these companies.  You will be contacted with the lab results as soon as they are available. The fastest way to get your results is to activate your My Chart account. Instructions are located on the last page of this paperwork. If you have not heard from us regarding the results in 2 weeks, please contact this office.     

## 2017-06-06 ENCOUNTER — Telehealth: Payer: Self-pay | Admitting: Physician Assistant

## 2017-06-06 NOTE — Telephone Encounter (Signed)
Copied from CRM [id number]. Topic: Quick Communication - See Telephone Encounter >> Jun 06, 2017  8:52 AM Louie BunPalacios Medina, Rosey Batheresa D wrote: CRM for notification. See Telephone encounter for: 06/06/17. Patient called and [facility] back [facility] about additional disability forms he needs her to file out. Please call patient back.

## 2017-06-06 NOTE — Telephone Encounter (Signed)
PEC message sent to [facility] Re: disability information

## 2017-06-06 NOTE — Telephone Encounter (Signed)
Please get the forms so that I can review them. Patient can photograph them and upload in My Chart, so I can see them.

## 2017-06-08 ENCOUNTER — Encounter: Payer: Self-pay | Admitting: Physician Assistant

## 2017-06-08 NOTE — Telephone Encounter (Signed)
ALL disability messages need to come to the FMLA/DISABILITY POOL there is a strict process that has to be followed to make sure these forms are completed correctly.  Patient brought forms in on 06/07/17 to be completed by [name]--it states on paperwork that they are due by 4/26 so I am hoping the pt got an extension on them.  I will place the forms in [name]'s box on 06/08/17 please return to the FMLA/Disability desk within 5-7 business days. Thank you!

## 2017-06-10 NOTE — Assessment & Plan Note (Signed)
Continue with physical therapy, increased exercise, follow-up with Dr. Ayesha Mohair and pain management.

## 2017-06-10 NOTE — Assessment & Plan Note (Signed)
Persists, primarily due to pain.  Continue zolpidem.

## 2017-06-10 NOTE — Assessment & Plan Note (Signed)
Increase bupropion from 150 mg to 300 mg.  Continue Adderall as needed with goal to eliminate stimulant therapy.

## 2017-06-10 NOTE — Assessment & Plan Note (Signed)
Continue efforts to manage pain in hopes of reducing opiate use.

## 2017-06-10 NOTE — Assessment & Plan Note (Signed)
Persists.  Worse with reducing stimulants.  Increase bupropion from 150 mg to 300 mg.  Would consider Nuvigil if this is ineffective given his history of narcolepsy.

## 2017-06-13 NOTE — Telephone Encounter (Signed)
Forms scanned and faxed on 06/13/17

## 2017-06-13 NOTE — Telephone Encounter (Signed)
Forms completed. Returned to [employer]

## 2017-06-19 ENCOUNTER — Encounter: Payer: Self-pay | Admitting: Physician Assistant

## 2017-06-26 NOTE — Progress Notes (Signed)
[name] [address] [place], [place] [address] Phone: [phone] Subjective:     CC: Back pain follow-up  [id number]  Frank [name] is a 27 y.o. male coming in with complaint of neck pain.  Please see previous notes.  Patient has had some mild degenerative disc disease at L4-L5.  Patient has been treated for low testosterone as well.  Patient is on chronic narcotics from primary care provider.  Working with physical therapy and is out of work.  Patient has been doing relatively well overall though.  Patient feels that the physical therapy has been helpful.  States that he is having more good days than bad days.  No other significant medication changes at this time. Patient continues to follow-up with pain management as well and they were holding on a spinal cord stimulator at this moment with patient making some progress.      Past Medical History:  Diagnosis Date  . ADD (attention deficit disorder)   . Anxiety   . Narcolepsy through school   Past Surgical History:  Procedure Laterality Date  . KNEE SURGERY    . WISDOM TOOTH EXTRACTION  2013   Social History   Socioeconomic History  . Marital status: Single    Spouse name: Not on file  . Number of children: 0  . Years of education: Not on file  . Highest education level: Bachelor's degree (e.g., BA, AB, BS)  Occupational History    Employer: [employer]  Social Needs  . Financial resource strain: Not on file  . Food insecurity:    Worry: Not on file    Inability: Not on file  . Transportation needs:    Medical: Not on file    Non-medical: Not on file  Tobacco Use  . Smoking status: Never Smoker  . Smokeless tobacco: Never Used  Substance and Sexual Activity  . Alcohol use: Yes    Alcohol/week: 7.2 oz    Types: 5 Shots of liquor, 7 Standard drinks or equivalent per week    Comment: Mostly just weekends  . Drug use: No  . Sexual activity: Yes    Birth control/protection:  Condom  Lifestyle  . Physical activity:    Days per week: Not on file    Minutes per session: Not on file  . Stress: Not on file  Relationships  . Social connections:    Talks on phone: Not on file    Gets together: Not on file    Attends religious service: Not on file    Active member of club or organization: Not on file    Attends meetings of clubs or organizations: Not on file    Relationship status: Not on file  Other Topics Concern  . Not on file  Social History Narrative   Caffeine 4-5 cups a day. Decreasing adderall has increased need for coffee.   Moved back in with his parents from [place], [place] in 02/2017 to address his chronic pain and disability more aggressively (leave of absence from work).   No Known Allergies Family History  Problem Relation Age of Onset  . Stroke Maternal Grandmother   . Cancer Paternal Grandmother        Breast  . Hypertension Paternal Grandmother   . Diabetes Paternal Grandfather   . Prostate cancer Father        and grandfather     Past medical history, social, surgical and family history all reviewed in electronic medical record.  No pertanent information unless stated regarding to the chief complaint.   Review of Systems:Review of systems updated and as accurate as of 06/27/17  No headache, visual changes, nausea, vomiting, diarrhea, constipation, dizziness, abdominal pain, skin rash, fevers, chills, night sweats, weight loss, swollen lymph nodes, chest pain, shortness of breath, mood changes.  Positive muscle aches and body aches  Objective  Blood pressure 140/74, pulse 79, height 6' (1.829 m), weight 240 lb (108.9 kg), SpO2 98 %. Systems examined below as of 06/27/17   General: No apparent distress alert and oriented x3 mood and affect normal, dressed appropriately.  HEENT: Pupils equal, extraocular movements intact  Respiratory: Patient's speak in full sentences and does not appear short of breath  Cardiovascular: No lower extremity  edema, non tender, no erythema  Skin: Warm dry intact with no signs of infection or rash on extremities or on axial skeleton.  Abdomen: Soft nontender  Neuro: Cranial nerves II through XII are intact, neurovascularly intact in all extremities with 2+ DTRs and 2+ pulses.  Lymph: No lymphadenopathy of posterior or anterior cervical chain or axillae bilaterally.  Gait normal with good balance and coordination.  MSK:  Non tender with full range of motion and good stability and symmetric strength and tone of shoulders, elbows, wrist, hip, knee and ankles bilaterally.  Back Exam:  Inspection: Mild loss of lordosis Motion: Flexion 45 deg, Extension 25 deg, Side Bending to 35 deg bilaterally,  Rotation to 35 deg bilaterally  SLR laying: Negative  XSLR laying: Negative  Palpable tenderness: Tender to palpation of the paraspinal musculature lumbar spine diffusely more so in the thoracolumbar juncture. FABER: negative. Sensory change: Gross sensation intact to all lumbar and sacral dermatomes.  Reflexes: 2+ at both patellar tendons, 2+ at achilles tendons, Babinski's downgoing.  Strength at foot  Plantar-flexion: 5/5 Dorsi-flexion: 5/5 Eversion: 5/5 Inversion: 5/5  Leg strength  Quad: 5/5 Hamstring: 5/5 Hip flexor: 5/5 Hip abductors: 4+/5  Gait unremarkable. Osteopathic findings C2 flexed rotated and side bent right C4 flexed rotated and side bent left C6 flexed rotated and side bent left T3 extended rotated and side bent right inhaled third rib T9 extended rotated and side bent left L2 flexed rotated and side bent right Sacrum right on right      Impression and Recommendations:     This case required medical decision making of moderate complexity.      Note: This dictation was prepared with Dragon dictation along with smaller phrase technology. Any transcriptional errors that result from this process are unintentional.

## 2017-06-27 ENCOUNTER — Encounter: Payer: Self-pay | Admitting: Family Medicine

## 2017-06-27 ENCOUNTER — Ambulatory Visit (INDEPENDENT_AMBULATORY_CARE_PROVIDER_SITE_OTHER): Payer: 59 | Admitting: Family Medicine

## 2017-06-27 VITALS — BP 140/74 | HR 79 | Ht 72.0 in | Wt 240.0 lb

## 2017-06-27 DIAGNOSIS — M4200 Juvenile osteochondrosis of spine, site unspecified: Secondary | ICD-10-CM | POA: Diagnosis not present

## 2017-06-27 DIAGNOSIS — M999 Biomechanical lesion, unspecified: Secondary | ICD-10-CM

## 2017-06-27 NOTE — Assessment & Plan Note (Signed)
More of a chronic pain than anything else.  Patient continues to have difficulty.  Patient is going to be doing some type of remote work in the near future.  We discussed with patient about icing regimen and home exercises.  We discussed which activities to doing which wants to avoid.  Patient is to increase activity slowly over the course the next several days.  Patient will make no significant changes to treatment at this time.  Follow-up with me again in 5 to 6 weeks.

## 2017-06-27 NOTE — Patient Instructions (Signed)
Good to see you  I am glad you are making progress keep it up! Stay active, best thing you can do for yourself.  As long as you do well see me again in 5-6 weeks

## 2017-06-27 NOTE — Assessment & Plan Note (Signed)
Decision today to treat with OMT was based on Physical Exam  After verbal consent patient was treated with HVLA, ME, FPR techniques in cervical, thoracic, lumbar and sacral areas  Patient tolerated the procedure well with improvement in symptoms  Patient given exercises, stretches and lifestyle modifications  See medications in patient instructions if given  Patient will follow up in 5-6 weeks 

## 2017-06-28 ENCOUNTER — Ambulatory Visit: Payer: 59 | Admitting: Endocrinology

## 2017-06-28 ENCOUNTER — Encounter: Payer: Self-pay | Admitting: Endocrinology

## 2017-06-28 VITALS — BP 130/84 | HR 83 | Ht 72.0 in | Wt 242.0 lb

## 2017-06-28 DIAGNOSIS — R7989 Other specified abnormal findings of blood chemistry: Secondary | ICD-10-CM | POA: Diagnosis not present

## 2017-06-28 LAB — CBC WITH DIFFERENTIAL/PLATELET
BASOS ABS: 0 10*3/uL (ref 0.0–0.1)
BASOS PCT: 0.5 % (ref 0.0–3.0)
EOS ABS: 0.1 10*3/uL (ref 0.0–0.7)
Eosinophils Relative: 1.3 % (ref 0.0–5.0)
HEMATOCRIT: 45.3 % (ref 39.0–52.0)
HEMOGLOBIN: 15.8 g/dL (ref 13.0–17.0)
LYMPHS PCT: 21.5 % (ref 12.0–46.0)
Lymphs Abs: 1.4 10*3/uL (ref 0.7–4.0)
MCHC: 34.8 g/dL (ref 30.0–36.0)
MCV: 92 fl (ref 78.0–100.0)
MONOS PCT: 7.4 % (ref 3.0–12.0)
Monocytes Absolute: 0.5 10*3/uL (ref 0.1–1.0)
Neutro Abs: 4.4 10*3/uL (ref 1.4–7.7)
Neutrophils Relative %: 69.3 % (ref 43.0–77.0)
Platelets: 143 10*3/uL — ABNORMAL LOW (ref 150.0–400.0)
RBC: 4.93 Mil/uL (ref 4.22–5.81)
RDW: 12.6 % (ref 11.5–15.5)
WBC: 6.4 10*3/uL (ref 4.0–10.5)

## 2017-06-28 LAB — TSH: TSH: 1.96 u[IU]/mL (ref 0.35–4.50)

## 2017-06-28 LAB — IBC PANEL
Iron: 73 ug/dL (ref 42–165)
SATURATION RATIOS: 23.9 % (ref 20.0–50.0)
Transferrin: 218 mg/dL (ref 212.0–360.0)

## 2017-06-28 NOTE — Progress Notes (Signed)
Subjective:    Patient ID: Frank [name], male    DOB: [birth year], 27 y.o.   MRN: [id number]  HPI Pt is referred by Porfirio Oar, PA, for hypogonadism.  Pt reports he had puberty at the normal age.  He has no biological children.  He says he has never taken illicit androgens.  He has been on clomid since 2018.  He does not take antiandrogens.  He denies any h/o infertility, XRT, or genital infection.  He has never had surgery, or a serious injury to the head or genital area. He has no h/o sleep apnea or DVT.   He does not consume alcohol excessively.  He lost 80 lbs in 2018.  He has severe pain at the lower back, and assoc mild depression. He is not considering fertility now, but wold like to consider this option for the future.   Past Medical History:  Diagnosis Date  . ADD (attention deficit disorder)   . Anxiety   . Narcolepsy through school    Past Surgical History:  Procedure Laterality Date  . KNEE SURGERY    . WISDOM TOOTH EXTRACTION  2013    Social History   Socioeconomic History  . Marital status: Single    Spouse name: Not on file  . Number of children: 0  . Years of education: Not on file  . Highest education level: Bachelor's degree (e.g., BA, AB, BS)  Occupational History    Employer: [employer]  Social Needs  . Financial resource strain: Not on file  . Food insecurity:    Worry: Not on file    Inability: Not on file  . Transportation needs:    Medical: Not on file    Non-medical: Not on file  Tobacco Use  . Smoking status: Never Smoker  . Smokeless tobacco: Never Used  Substance and Sexual Activity  . Alcohol use: Yes    Alcohol/week: 7.2 oz    Types: 5 Shots of liquor, 7 Standard drinks or equivalent per week    Comment: Mostly just weekends  . Drug use: No  . Sexual activity: Yes    Birth control/protection: Condom  Lifestyle  . Physical activity:    Days per week: Not on file    Minutes per session: Not on file  . Stress: Not on file    Relationships  . Social connections:    Talks on phone: Not on file    Gets together: Not on file    Attends religious service: Not on file    Active member of club or organization: Not on file    Attends meetings of clubs or organizations: Not on file    Relationship status: Not on file  . Intimate partner violence:    Fear of current or ex partner: Not on file    Emotionally abused: Not on file    Physically abused: Not on file    Forced sexual activity: Not on file  Other Topics Concern  . Not on file  Social History Narrative   Caffeine 4-5 cups a day. Decreasing adderall has increased need for coffee.   Moved back in with his parents from [place], [place] in 02/2017 to address his chronic pain and disability more aggressively (leave of absence from work).    Current Outpatient Medications on File Prior to Visit  Medication Sig Dispense Refill  . amphetamine-dextroamphetamine (ADDERALL) 30 MG tablet Take 1.5 tablets by mouth 2 (two) times daily. (Patient taking differently: Take 45 mg by mouth  2 (two) times daily. Down to 5 mg as needed will be completely off soon.) 90 tablet 0  . buPROPion (WELLBUTRIN XL) 300 MG 24 hr tablet Take 1 tablet (300 mg total) by mouth daily. 30 tablet 1  . clomiPHENE (CLOMID) 50 MG tablet Tk 1 pill a day  3  . NUCYNTA 75 MG tablet TK 1 T PO Q 4 TO 6 H PRN  0  . oxyCODONE-acetaminophen (PERCOCET) 10-325 MG tablet Take 1-2 tablets by mouth every 8 (eight) hours as needed for pain. 100 tablet 0  . traZODone (DESYREL) 50 MG tablet Take 0.5-1 tablets (25-50 mg total) by mouth at bedtime as needed for sleep. 90 tablet 3  . Vitamin D, Ergocalciferol, (DRISDOL) 50000 units CAPS capsule Take 1 capsule (50,000 Units total) by mouth every 7 (seven) days. 12 capsule 3  . zolpidem (AMBIEN) 10 MG tablet Take 1 tablet (10 mg total) by mouth at bedtime as needed. 30 tablet 0   No current facility-administered medications on file prior to visit.     No Known  Allergies  Family History  Problem Relation Age of Onset  . Stroke Maternal Grandmother   . Cancer Paternal Grandmother        Breast  . Hypertension Paternal Grandmother   . Diabetes Paternal Grandfather   . Prostate cancer Father        and grandfather  . Other Neg Hx     BP 130/84   Pulse 83   Ht 6' (1.829 m)   Wt 242 lb (109.8 kg)   SpO2 96%   BMI 32.82 kg/m     Review of Systems denies numbness, erectile dysfunction, weight change, decreased urinary stream, gynecomastia, muscle weakness, fever, headache, easy bruising, sob, rash, diplopia, rhinorrhea, and chest pain.       Objective:   Physical Exam VS: see vs page GEN: no distress HEAD: head: no deformity eyes: no periorbital swelling, no proptosis external nose and ears are normal mouth: no lesion seen NECK: supple, thyroid is not enlarged CHEST WALL: no deformity LUNGS: clear to auscultation BREASTS:  Slight bilat pseudogynecomastia CV: reg rate and rhythm, no murmur ABD: abdomen is soft, nontender.  no hepatosplenomegaly.  not distended.  no hernia GENITALIA:  Normal male.   MUSCULOSKELETAL: muscle bulk and strength are grossly normal.  no obvious joint swelling.  gait is normal and steady EXTEMITIES: no leg edema PULSES: no carotid bruit. NEURO:  cn 2-12 grossly intact.   readily moves all 4's.  sensation is intact to touch on all 4's SKIN:  Normal texture and temperature.  No rash or suspicious lesion is visible.  Normal hair distribution.  NODES:  None palpable at the neck PSYCH: alert, well-oriented.  Does not appear anxious nor depressed.  Lab Results  Component Value Date   TSH 1.09 09/11/2016   Lab Results  Component Value Date   TESTOSTERONE 225 (L) 09/11/2016   Lab Results  Component Value Date   WBC 5.6 09/11/2016   HGB 15.8 09/11/2016   HCT 44.7 09/11/2016   MCV 90.3 09/11/2016   PLT 163.0 09/11/2016   outside test results are reviewed (2018): Prolacti=6.5 LH=4  I have  reviewed outside records, and summarized: Pt was noted to have hypogonadism, and referred here.  He was seen for several medical problems, and was advised to reduce consumption of controlled substances.  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  We'll let you know about the results.  Testosterone treatment has risks, including increased fertility, hair loss, prostate cancer, benign prostate enlargement, blood clots, liver problems, lower hdl ("good cholesterol"), polycythemia (opposite of anemia), sleep apnea, and behavior changes.   Please come back for a follow-up appointment in 1 year.

## 2017-06-28 NOTE — Patient Instructions (Addendum)
blood tests are requested for you today.  We'll let you know about the results.  Testosterone treatment has risks, including increased fertility, hair loss, prostate cancer, benign prostate enlargement, blood clots, liver problems, lower hdl ("good cholesterol"), polycythemia (opposite of anemia), sleep apnea, and behavior changes.  Please come back for a follow-up appointment in 1 year.   

## 2017-06-29 LAB — TESTOSTERONE,FREE AND TOTAL
Testosterone, Free: 14.2 pg/mL (ref 9.3–26.5)
Testosterone: 655 ng/dL (ref 264–916)

## 2017-07-10 ENCOUNTER — Other Ambulatory Visit: Payer: Self-pay | Admitting: Physician Assistant

## 2017-07-11 ENCOUNTER — Encounter: Payer: Self-pay | Admitting: Physician Assistant

## 2017-07-11 ENCOUNTER — Ambulatory Visit: Payer: 59 | Admitting: Physician Assistant

## 2017-07-11 ENCOUNTER — Other Ambulatory Visit: Payer: Self-pay

## 2017-07-11 VITALS — BP 138/70 | HR 78 | Temp 97.6°F | Resp 16 | Ht 72.0 in | Wt 233.6 lb

## 2017-07-11 DIAGNOSIS — F988 Other specified behavioral and emotional disorders with onset usually occurring in childhood and adolescence: Secondary | ICD-10-CM | POA: Diagnosis not present

## 2017-07-11 DIAGNOSIS — G4719 Other hypersomnia: Secondary | ICD-10-CM | POA: Diagnosis not present

## 2017-07-11 DIAGNOSIS — J209 Acute bronchitis, unspecified: Secondary | ICD-10-CM | POA: Diagnosis not present

## 2017-07-11 DIAGNOSIS — G4701 Insomnia due to medical condition: Secondary | ICD-10-CM | POA: Diagnosis not present

## 2017-07-11 DIAGNOSIS — M7918 Myalgia, other site: Secondary | ICD-10-CM

## 2017-07-11 DIAGNOSIS — G4700 Insomnia, unspecified: Secondary | ICD-10-CM

## 2017-07-11 MED ORDER — AMPHETAMINE-DEXTROAMPHETAMINE 15 MG PO TABS: 15.0000 mg | ORAL_TABLET | Freq: Two times a day (BID) | ORAL | 0 refills | Status: DC

## 2017-07-11 MED ORDER — OXYCODONE-ACETAMINOPHEN 10-325 MG PO TABS: 1.0000 | ORAL_TABLET | Freq: Three times a day (TID) | ORAL | 0 refills | Status: AC | PRN

## 2017-07-11 MED ORDER — AZELASTINE HCL 0.1 % NA SOLN: 2.0000 | Freq: Two times a day (BID) | NASAL | 0 refills | Status: DC

## 2017-07-11 MED ORDER — AMPHETAMINE-DEXTROAMPHETAMINE 15 MG PO TABS
15.0000 mg | ORAL_TABLET | Freq: Two times a day (BID) | ORAL | 0 refills | Status: DC
Start: 2017-07-11 — End: 2017-07-11

## 2017-07-11 MED ORDER — ALBUTEROL SULFATE HFA 108 (90 BASE) MCG/ACT IN AERS: 2.0000 | INHALATION_SPRAY | RESPIRATORY_TRACT | 1 refills | Status: DC | PRN

## 2017-07-11 MED ORDER — BENZONATATE 100 MG PO CAPS: 100.0000 mg | ORAL_CAPSULE | Freq: Three times a day (TID) | ORAL | 0 refills | Status: DC | PRN

## 2017-07-11 MED ORDER — BUPROPION HCL ER (XL) 300 MG PO TB24: 300.0000 mg | ORAL_TABLET | Freq: Every day | ORAL | 3 refills | Status: DC

## 2017-07-11 MED ORDER — TRAZODONE HCL 50 MG PO TABS: 50.0000 mg | ORAL_TABLET | Freq: Every evening | ORAL | 3 refills | Status: DC | PRN

## 2017-07-11 MED ORDER — ZOLPIDEM TARTRATE 10 MG PO TABS
10.0000 mg | ORAL_TABLET | Freq: Every evening | ORAL | 0 refills | Status: DC | PRN
Start: 2017-07-15 — End: 2021-06-22

## 2017-07-11 NOTE — Patient Instructions (Addendum)
I'll see you in July!!!    IF you received an x-ray today, you will receive an invoice from [facility]. Please contact [facility] at [phone] with questions or concerns regarding your invoice.   IF you received labwork today, you will receive an invoice from [place] [facility]. Please contact [facility] at [phone] with questions or concerns regarding your invoice.   Our billing staff will not be able to assist you with questions regarding bills from these companies.  You will be contacted with the lab results as soon as they are available. The fastest way to get your results is to activate your My Chart account. Instructions are located on the last page of this paperwork. If you have not heard from [place] regarding the results in 2 weeks, please contact this office.

## 2017-07-11 NOTE — Progress Notes (Signed)
Patient ID: Frank [name], male    DOB: 1990-06-17, 27 y.o.   MRN: [id number]  PCP: Porfirio Oar, PA-C  Chief Complaint  Patient presents with  . Insomnia    pain still keeping him up   . Fatigue    pretty bad, some days better than other   . Mood    doing alright     Subjective:   Presents for evaluation of insomnia, chronic pain due to myofascial syndrome, DJD, Scheuermann's Syndrome, ADD, mood.  At his last visit, we increased bupropion XL from 150 mg to 300 mg, in the hope that we could reduce stimulant use. We also discussed the possibility of using Nuvigil to help with daytime somnolence, though ultimate goal is to minimize pain such that he can sleep well at night.  Mood is improved with increased bupropion dose. Is sleeping a little bit better lately. Has increased trazodone to 1.5 tablets (75 mg).  Has a new job! Accounting and finance. Once he completes the training, he'll be working from home and with very flexible hours. Concentration has been more difficult, anxiety much worse. Has good insight, and while substantial, is manageable.  Last week he had a respiratory illness, with fever, nausea/vomiting. He had wheezing and chest congestion for about 5 days. Lost some weight. His mother developed similar symptoms a few days later.  He has seen Dr. Everardo All, and testosterone was up into the 600's. Last visit with Dr. Terrilee Files revealed considerable progress.   Review of Systems As above.  Depression screen  [facility] 2/9 07/11/2017 06/05/2017 05/03/2017 04/10/2017 03/14/2017  Decreased Interest 1 0 0 0 0  Down, Depressed, Hopeless 0 0 0 0 0  PHQ - 2 Score 1 0 0 0 0  Altered sleeping 3 - - - -  Tired, decreased energy 3 - - - -  Change in appetite 0 - - - -  Feeling bad or failure about yourself  0 - - - -  Trouble concentrating 3 - - - -  Moving slowly or fidgety/restless 0 - - - -  Suicidal thoughts 0 - - - -  PHQ-9 Score 10 - - - -  Difficult doing work/chores  Somewhat difficult - - - -     Patient Active Problem List   Diagnosis Date Noted  . Low testosterone in male 09/29/2016  . 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 Chronic prescription opiate use 01/17/2016  . Scheuermann's disease 12/21/2015  . Nonallopathic lesion of thoracic region 12/21/2015  . Nonallopathic lesion of sacral region 12/21/2015  . Nonallopathic lesion of lumbosacral region 12/21/2015  . Myofascial pain 10/12/2014  . Kyphosis of thoracic region 10/02/2014  . Degeneration of intervertebral disc of thoracic region 10/02/2014  . Excessive daytime sleepiness 03/04/2014  . Patient overweight 03/04/2014  . Snoring 03/04/2014  . Insomnia 01/30/2012  . ADD (attention deficit disorder) 08/23/2011  . Acne 08/23/2011  . History of OCD (obsessive compulsive disorder) 08/23/2011  . BMI 38.0-38.9,adult 08/23/2011  . Narcolepsy 07/26/2011     Prior to Admission medications   Medication Sig Start Date End Date Taking? Authorizing Provider  amphetamine-dextroamphetamine (ADDERALL) 30 MG tablet Take 1.5 tablets by mouth 2 (two) times daily. Patient taking differently: Take 45 mg by mouth 2 (two) times daily. Down to 5 mg as needed will be completely off soon. 05/15/17  Yes Shaneta Cervenka, PA-C  buPROPion (WELLBUTRIN XL) 300 MG 24 hr tablet Take 1 tablet (300 mg total)  by mouth daily. 06/05/17  Yes Ryley Teater, PA-C  clomiPHENE (CLOMID) 50 MG tablet Tk 1 pill a day 01/15/17  Yes [provider]  oxyCODONE-acetaminophen (PERCOCET) 10-325 MG tablet Take 1-2 tablets by mouth every 8 (eight) hours as needed for pain. 06/05/17  Yes Emiya Loomer, PA-C  traZODone (DESYREL) 50 MG tablet Take 0.5-1 tablets (25-50 mg total) by mouth at bedtime as needed for sleep. 01/11/17  Yes Tabb Croghan, PA-C  Vitamin D, Ergocalciferol, (DRISDOL) 50000 units CAPS capsule Take 1 capsule (50,000 Units total) by mouth every 7 (seven) days. 01/11/17  Yes Cande Mastropietro, PA-C    zolpidem (AMBIEN) 10 MG tablet Take 1 tablet (10 mg total) by mouth at bedtime as needed. 06/05/17  Yes Jacobey Gura, PA-C  NUCYNTA 100 MG TABS TAKE 1 TABLET BY ORAL ROUTE EVERY 6 HOURS AS NEEDED 06/29/17  Yes [provider]     No Known Allergies     Objective:  Physical Exam  Constitutional: He is oriented to person, place, and time. He appears well-developed and well-nourished. He is active and cooperative. No distress.  BP 138/70   Pulse 78   Temp 97.6 F (36.4 C)   Resp 16   Ht 6' (1.829 m)   Wt 233 lb 9.6 oz (106 kg)   SpO2 98%   BMI 31.68 kg/m   HENT:  Head: Normocephalic and atraumatic.  Right Ear: Hearing normal.  Left Ear: Hearing normal.  Eyes: Conjunctivae are normal. No scleral icterus.  Neck: Normal range of motion. Neck supple. No thyromegaly present.  Cardiovascular: Normal rate, regular rhythm and normal heart sounds.  Pulses:      Radial pulses are 2+ on the right side, and 2+ on the left side.  Pulmonary/Chest: Effort normal and breath sounds normal.  Lymphadenopathy:       Head (right side): No tonsillar, no preauricular, no posterior auricular and no occipital adenopathy present.       Head (left side): No tonsillar, no preauricular, no posterior auricular and no occipital adenopathy present.    He has no cervical adenopathy.       Right: No supraclavicular adenopathy present.       Left: No supraclavicular adenopathy present.  Neurological: He is alert and oriented to person, place, and time. No sensory deficit.  Skin: Skin is warm, dry and intact. No rash noted. No cyanosis or erythema. Nails show no clubbing.  Psychiatric: He has a normal mood and affect. His speech is normal and behavior is normal.   Wt Readings from Last 3 Encounters:  07/11/17 233 lb 9.6 oz (106 kg)  06/28/17 242 lb (109.8 kg)  06/27/17 240 lb (108.9 kg)       Assessment & Plan:   Problem List Items Addressed This Visit    Myofascial pain    Continue work  with Dr. Katrinka Blazing, Dr. Ollen Bowl and PT. Continue oxycodone.      Relevant Medications   oxyCODONE-acetaminophen (PERCOCET) 10-325 MG tablet (Start on 08/13/2017)   oxyCODONE-acetaminophen (PERCOCET) 10-325 MG tablet (Start on 07/14/2017)   Insomnia    Improved with increased trazodone dose. OK to increase from 75 mg to 100 mg if OK with his other providers. I believe that improved sleep will benefit all the current issues.      Relevant Medications   traZODone (DESYREL) 50 MG tablet   zolpidem (AMBIEN) 10 MG tablet (Start on 07/15/2017)   Excessive daytime sleepiness    Chronic. Better with improved sleep, increased bupropion  use and PRN stimulant. Would still consider Nuvigil in the future if needed.      Relevant Medications   buPROPion (WELLBUTRIN XL) 300 MG 24 hr tablet   ADD (attention deficit disorder) - Primary    Using 15 mg 2-3 times/day, depending on the work schedule.      Relevant Medications   amphetamine-dextroamphetamine (ADDERALL) 15 MG tablet   buPROPion (WELLBUTRIN XL) 300 MG 24 hr tablet   amphetamine-dextroamphetamine (ADDERALL) 15 MG tablet (Start on 09/09/2017)   amphetamine-dextroamphetamine (ADDERALL) 15 MG tablet (Start on 08/10/2017)    Other Visit Diagnoses    Acute bronchitis, unspecified organism       Relevant Medications   azelastine (ASTELIN) 0.1 % nasal spray   albuterol (PROVENTIL HFA;VENTOLIN HFA) 108 (90 Base) MCG/ACT inhaler   benzonatate (TESSALON) 100 MG capsule       Return in about 6 weeks (around 08/22/2017) for re-evaluation, as planned.   Fernande Bras, PA-C [facility] at [facility]

## 2017-07-11 NOTE — Assessment & Plan Note (Signed)
Using 15 mg 2-3 times/day, depending on the work schedule.

## 2017-07-11 NOTE — Assessment & Plan Note (Signed)
Improved with increased trazodone dose. OK to increase from 75 mg to 100 mg if OK with his other providers. I believe that improved sleep will benefit all the current issues.

## 2017-07-11 NOTE — Assessment & Plan Note (Signed)
Continue work with Dr. Katrinka Blazing, Dr. Ollen Bowl and PT. Continue oxycodone.

## 2017-07-11 NOTE — Assessment & Plan Note (Signed)
Chronic. Better with improved sleep, increased bupropion use and PRN stimulant. Would still consider Nuvigil in the future if needed.

## 2017-07-31 NOTE — Progress Notes (Signed)
[name] [address] [place], [place] [address] Phone: [phone] Subjective:      CC: Back pain  [id number]  Frank [name] is a 27 y.o. male coming in with complaint of back pain. Patient was sick for a couple of weeks and was not working out or going to physical therapy. He feels like his pain is improving but has not pushed himself lately. Is going to try to get dry needling performed again by his PT.  Patient has had significant difficulty with this.  Finally he is working again.  Going through training.  Somewhat feels like he is making progress.     Past Medical History:  Diagnosis Date  . ADD (attention deficit disorder)   . Anxiety   . Narcolepsy through school   Past Surgical History:  Procedure Laterality Date  . KNEE SURGERY    . WISDOM TOOTH EXTRACTION  2013   Social History   Socioeconomic History  . Marital status: Single    Spouse name: Not on file  . Number of children: 0  . Years of education: Not on file  . Highest education level: Bachelor's degree (e.g., BA, AB, BS)  Occupational History    Employer: [employer]  Social Needs  . Financial resource strain: Not on file  . Food insecurity:    Worry: Not on file    Inability: Not on file  . Transportation needs:    Medical: Not on file    Non-medical: Not on file  Tobacco Use  . Smoking status: Never Smoker  . Smokeless tobacco: Never Used  Substance and Sexual Activity  . Alcohol use: Yes    Alcohol/week: 7.2 oz    Types: 5 Shots of liquor, 7 Standard drinks or equivalent per week    Comment: Mostly just weekends  . Drug use: No  . Sexual activity: Yes    Birth control/protection: Condom  Lifestyle  . Physical activity:    Days per week: Not on file    Minutes per session: Not on file  . Stress: Not on file  Relationships  . Social connections:    Talks on phone: Not on file    Gets together: Not on file    Attends religious service: Not on file   Active member of club or organization: Not on file    Attends meetings of clubs or organizations: Not on file    Relationship status: Not on file  Other Topics Concern  . Not on file  Social History Narrative   Caffeine 4-5 cups a day. Decreasing adderall has increased need for coffee.   Moved back in with his parents from [place], [place] in 02/2017 to address his chronic pain and disability more aggressively (leave of absence from work).   No Known Allergies Family History  Problem Relation Age of Onset  . Stroke Maternal Grandmother   . Cancer Paternal Grandmother        Breast  . Hypertension Paternal Grandmother   . Diabetes Paternal Grandfather   . Prostate cancer Father        and grandfather  . Other Neg Hx      Past medical history, social, surgical and family history all reviewed in electronic medical record.  No pertanent information unless stated regarding to the chief complaint.   Review of Systems:Review of systems updated and as accurate as of 08/01/17  No headache, visual changes, nausea, vomiting, diarrhea, constipation, dizziness, abdominal pain, skin rash, fevers,  chills, night sweats, weight loss, swollen lymph nodes, chest pain, shortness of breath, mood changes.  Positive muscle aches and body aches  Objective  Blood pressure 108/68, pulse 79, height 6' (1.829 m), weight 243 lb (110.2 kg), SpO2 98 %. Systems examined below as of 08/01/17   General: No apparent distress alert and oriented x3 mood and affect normal, dressed appropriately.  HEENT: Pupils equal, extraocular movements intact  Respiratory: Patient's speak in full sentences and does not appear short of breath  Cardiovascular: No lower extremity edema, non tender, no erythema  Skin: Warm dry intact with no signs of infection or rash on extremities or on axial skeleton.  Abdomen: Soft nontender  Neuro: Cranial nerves II through XII are intact, neurovascularly intact in all extremities with 2+ DTRs and 2+  pulses.  Lymph: No lymphadenopathy of posterior or anterior cervical chain or axillae bilaterally.  Gait normal with good balance and coordination.  MSK:  Non tender with full range of motion and good stability and symmetric strength and tone of shoulders, elbows, wrist, hip, knee and ankles bilaterally.    Back Exam:  Inspection: Unremarkable  Motion: Flexion 45 deg, Extension 25 deg, Side Bending to 35 deg bilaterally,  Rotation to 35 deg bilaterally  SLR laying: Negative  XSLR laying: Negative  Palpable tenderness: Tender to palpation the paraspinal musculature. FABER: negative. Sensory change: Gross sensation intact to all lumbar and sacral dermatomes.  Reflexes: 2+ at both patellar tendons, 2+ at achilles tendons, Babinski's downgoing.  Strength at foot  Plantar-flexion: 5/5 Dorsi-flexion: 5/5 Eversion: 5/5 Inversion: 5/5  Leg strength  Quad: 5/5 Hamstring: 5/5 Hip flexor: 5/5 Hip abductors: 5/5  Gait unremarkable.   Impression and Recommendations:     This case required medical decision making of moderate complexity.      Note: This dictation was prepared with Dragon dictation along with smaller phrase technology. Any transcriptional errors that result from this process are unintentional.

## 2017-08-01 ENCOUNTER — Ambulatory Visit (INDEPENDENT_AMBULATORY_CARE_PROVIDER_SITE_OTHER): Payer: 59 | Admitting: Family Medicine

## 2017-08-01 ENCOUNTER — Encounter: Payer: Self-pay | Admitting: Family Medicine

## 2017-08-01 VITALS — BP 108/68 | HR 79 | Ht 72.0 in | Wt 243.0 lb

## 2017-08-01 DIAGNOSIS — M999 Biomechanical lesion, unspecified: Secondary | ICD-10-CM | POA: Diagnosis not present

## 2017-08-01 DIAGNOSIS — M545 Low back pain, unspecified: Secondary | ICD-10-CM

## 2017-08-01 DIAGNOSIS — M5134 Other intervertebral disc degeneration, thoracic region: Secondary | ICD-10-CM | POA: Diagnosis not present

## 2017-08-01 NOTE — Patient Instructions (Addendum)
Good to see you  Lets not change much  Lets try the dry needling on the inside part of the scapula.  I like where we are  Would consider a little plateau with lifting due to recent illness for 2-3 weeks then increase again  See me again in 6 weeks

## 2017-08-01 NOTE — Assessment & Plan Note (Signed)
Discussed topical anti-inflammatories and home exercises and posture.  Discussed which activity to doing which wants to avoid.

## 2017-08-01 NOTE — Assessment & Plan Note (Signed)
Decision today to treat with OMT was based on Physical Exam  After verbal consent patient was treated with HVLA, ME, FPR techniques in cervical, thoracic, lumbar and sacral areas  Patient tolerated the procedure well with improvement in symptoms  Patient given exercises, stretches and lifestyle modifications  See medications in patient instructions if given  Patient will follow up in 4-6 weeks 

## 2017-08-07 ENCOUNTER — Other Ambulatory Visit: Payer: Self-pay | Admitting: Physician Assistant

## 2017-08-07 DIAGNOSIS — J209 Acute bronchitis, unspecified: Secondary | ICD-10-CM

## 2017-08-22 ENCOUNTER — Telehealth: Payer: Self-pay | Admitting: Family Medicine

## 2017-08-22 NOTE — Telephone Encounter (Signed)
Copied from CRM [id number]. Topic: Inquiry >> Aug 22, 2017 11:30 AM Yvonna Alanisobinson, Andra M wrote: Reason for CRM: Patient called to inquire about the prior authorization form for his OxyCODONE-acetaminophen (PERCOCET) 10-325 MG tablet. Patient would like someone from the office to call him back on today within the next hour at [phone]. Patient has been without his medication for the last eight days. Patient states that he was told by one of our office staff on last week, that this would had been taken care of this week.       Thank You!!!

## 2017-08-24 NOTE — Telephone Encounter (Signed)
Please see note below. 

## 2017-08-29 ENCOUNTER — Ambulatory Visit: Payer: Managed Care, Other (non HMO) | Admitting: Family Medicine

## 2017-08-29 VITALS — BP 120/78 | HR 81 | Ht 72.0 in | Wt 241.0 lb

## 2017-08-29 DIAGNOSIS — M4200 Juvenile osteochondrosis of spine, site unspecified: Secondary | ICD-10-CM

## 2017-08-29 DIAGNOSIS — M999 Biomechanical lesion, unspecified: Secondary | ICD-10-CM

## 2017-08-29 NOTE — Assessment & Plan Note (Signed)
Decision today to treat with OMT was based on Physical Exam  After verbal consent patient was treated with HVLA, ME, FPR techniques in cervical, thoracic, rib, lumbar and sacral areas  Patient tolerated the procedure well with improvement in symptoms  Patient given exercises, stretches and lifestyle modifications  See medications in patient instructions if given  Patient will follow up in 4 weeks 

## 2017-08-29 NOTE — Telephone Encounter (Signed)
[facility] is [name].

## 2017-08-29 NOTE — Assessment & Plan Note (Signed)
Continues to have pain though.  Patient is on chronic narcotics which I have declined to fill for him.  He would have to follow-up with the providers to do given this medication.  Encourage patient to continue to be active and try to continue to increase activity.  Patient's work-up though has not been fairly significant for anything else.  Patient has not wanting to have the nerve stimulator placed at this moment which I think is fine.  Following up with me again 4 to 8 weeks

## 2017-08-29 NOTE — Patient Instructions (Addendum)
Good to see you  Regarding your pain medicines I think you should stop and we will see how you do  See me again next week at 1030 (ok to double book)

## 2017-08-29 NOTE — Progress Notes (Signed)
[name] [address] [place], [place] [address] Phone: [phone] Subjective:   :    CC: Back pain  [id number]  Frank [name] is a 27 y.o. male coming in with complaint of back pain. He did suffer a fall since we last saw him. He did have some radiating symptoms for two weeks. Denies any of those symptoms today. Did continue with physical therapy. Had a lot of muscular tightness that he has been trying to work out since fall. Has tried handing upside down but that made his pain worse. Dry needling at physical therapy has seemed to calm down tightness he states.  Patient states that it was just a aggravation of underlying problems and continued to have some radicular symptoms that has almost completely resolved.  Still the tightness in the chronic pain daily.  Had been seen another provider and given pain medications but is wondering if he wants to go back to continue the pain medications are not.      Past Medical History:  Diagnosis Date  . ADD (attention deficit disorder)   . Anxiety   . Narcolepsy through school   Past Surgical History:  Procedure Laterality Date  . KNEE SURGERY    . WISDOM TOOTH EXTRACTION  2013   Social History   Socioeconomic History  . Marital status: Single    Spouse name: Not on file  . Number of children: 0  . Years of education: Not on file  . Highest education level: Bachelor's degree (e.g., BA, AB, BS)  Occupational History    Employer: [employer]  Social Needs  . Financial resource strain: Not on file  . Food insecurity:    Worry: Not on file    Inability: Not on file  . Transportation needs:    Medical: Not on file    Non-medical: Not on file  Tobacco Use  . Smoking status: Never Smoker  . Smokeless tobacco: Never Used  Substance and Sexual Activity  . Alcohol use: Yes    Alcohol/week: 7.2 oz    Types: 5 Shots of liquor, 7 Standard drinks or equivalent per week    Comment: Mostly just weekends  .  Drug use: No  . Sexual activity: Yes    Birth control/protection: Condom  Lifestyle  . Physical activity:    Days per week: Not on file    Minutes per session: Not on file  . Stress: Not on file  Relationships  . Social connections:    Talks on phone: Not on file    Gets together: Not on file    Attends religious service: Not on file    Active member of club or organization: Not on file    Attends meetings of clubs or organizations: Not on file    Relationship status: Not on file  Other Topics Concern  . Not on file  Social History Narrative   Caffeine 4-5 cups a day. Decreasing adderall has increased need for coffee.   Moved back in with his parents from [place], [place] in 02/2017 to address his chronic pain and disability more aggressively (leave of absence from work).   No Known Allergies Family History  Problem Relation Age of Onset  . Stroke Maternal Grandmother   . Cancer Paternal Grandmother        Breast  . Hypertension Paternal Grandmother   . Diabetes Paternal Grandfather   . Prostate cancer Father        and grandfather  .  Other Neg Hx      Past medical history, social, surgical and family history all reviewed in electronic medical record.  No pertanent information unless stated regarding to the chief complaint.   Review of Systems:Review of systems updated and as accurate as of 08/29/17  No headache, visual changes, nausea, vomiting, diarrhea, constipation, dizziness, abdominal pain, skin rash, fevers, chills, night sweats, weight loss, swollen lymph nodes,joint swelling,chest pain, shortness of breath, mood changes.  Positive muscle aches and body aches  Objective  Blood pressure 120/78, pulse 81, height 6' (1.829 m), weight 241 lb (109.3 kg), SpO2 98 %. Systems examined below as of 08/29/17   General: No apparent distress alert and oriented x3 mood and affect normal, dressed appropriately.  HEENT: Pupils equal, extraocular movements intact  Respiratory:  Patient's speak in full sentences and does not appear short of breath  Cardiovascular: No lower extremity edema, non tender, no erythema  Skin: Warm dry intact with no signs of infection or rash on extremities or on axial skeleton.  Abdomen: Soft nontender  Neuro: Cranial nerves II through XII are intact, neurovascularly intact in all extremities with 2+ DTRs and 2+ pulses.  Lymph: No lymphadenopathy of posterior or anterior cervical chain or axillae bilaterally.  Gait normal with good balance and coordination.  MSK:  Non tender with full range of motion and good stability and symmetric strength and tone of shoulders, elbows, wrist, hip, knee and ankles bilaterally.  Back exam shows some loss of lordosis.  Patient has diffuse tenderness to palpation in the paraspinal musculature mostly in the thoracolumbar on the left side as well as the sacroiliac joint on the right side.  Positive Faber negative straight leg test.  4+ out of 5 strength in lower extreme knees bilaterally.  Almost complete full range of motion  Osteopathic findings C2 flexed rotated and side bent right C4 flexed rotated and side bent left C6 flexed rotated and side bent left T9 extended rotated and side bent left inhaled rib L2 flexed rotated and side bent left Sacrum right on right     Impression and Recommendations:     This case required medical decision making of moderate complexity.      Note: This dictation was prepared with Dragon dictation along with smaller phrase technology. Any transcriptional errors that result from this process are unintentional.

## 2017-09-04 NOTE — Progress Notes (Signed)
Tawana ScaleZach Ceil Roderick D.O. [facility] [address] [place], [place] [address] Phone: [phone]) [phone] Subjective:    I'm seeing this patient by the request  of:    CC: Back pain  BJY:[id number]:Subjective  [name] is a 27 y.o. male coming in with complaint of back pain. States his back is tight today.  Patient is going to be traveling.  Here for manipulation.  Was seen last week.  Feels like he is done relatively well.  Still having pain around the scapula in the lower back.       Past Medical History:  Diagnosis Date  . ADD (attention deficit disorder)   . Anxiety   . Narcolepsy through school   Past Surgical History:  Procedure Laterality Date  . KNEE SURGERY    . WISDOM TOOTH EXTRACTION  2013   Social History   Socioeconomic History  . Marital status: Single    Spouse name: Not on file  . Number of children: 0  . Years of education: Not on file  . Highest education level: Bachelor's degree (e.g., BA, AB, BS)  Occupational History    Employer: [employer]  Social Needs  . Financial resource strain: Not on file  . Food insecurity:    Worry: Not on file    Inability: Not on file  . Transportation needs:    Medical: Not on file    Non-medical: Not on file  Tobacco Use  . Smoking status: Never Smoker  . Smokeless tobacco: Never Used  Substance and Sexual Activity  . Alcohol use: Yes    Alcohol/week: 7.2 oz    Types: 5 Shots of liquor, 7 Standard drinks or equivalent per week    Comment: Mostly just weekends  . Drug use: No  . Sexual activity: Yes    Birth control/protection: Condom  Lifestyle  . Physical activity:    Days per week: Not on file    Minutes per session: Not on file  . Stress: Not on file  Relationships  . Social connections:    Talks on phone: Not on file    Gets together: Not on file    Attends religious service: Not on file    Active member of club or organization: Not on file    Attends meetings of clubs or organizations: Not on file   Relationship status: Not on file  Other Topics Concern  . Not on file  Social History Narrative   Caffeine 4-5 cups a day. Decreasing adderall has increased need for coffee.   Moved back in with [place] parents from [place], [place] in 02/2017 to address his chronic pain and disability more aggressively (leave of absence from work).   No Known Allergies Family History  Problem Relation Age of Onset  . Stroke Maternal Grandmother   . Cancer Paternal Grandmother        Breast  . Hypertension Paternal Grandmother   . Diabetes Paternal Grandfather   . Prostate cancer Father        and grandfather  . Other Neg Hx      Past medical history, social, surgical and family history all reviewed in electronic medical record.  No pertanent information unless stated regarding to the chief complaint.   Review of Systems:Review of systems updated and as accurate as of 09/05/17  No headache, visual changes, nausea, vomiting, diarrhea, constipation, dizziness, abdominal pain, skin rash, fevers, chills, night sweats, weight loss, swollen lymph nodes,chest pain, shortness of breath, mood changes.  Positive muscle  aches and body aches  Objective  Blood pressure 130/74, pulse 89, height 6' (1.829 m), weight 245 lb (111.1 kg), SpO2 97 %. Systems examined below as of 09/05/17   General: No apparent distress alert and oriented x3 mood and affect normal, dressed appropriately.  HEENT: Pupils equal, extraocular movements intact  Respiratory: Patient's speak in full sentences and does not appear short of breath  Cardiovascular: No lower extremity edema, non tender, no erythema  Skin: Warm dry intact with no signs of infection or rash on extremities or on axial skeleton.  Abdomen: Soft nontender  Neuro: Cranial nerves II through XII are intact, neurovascularly intact in all extremities with 2+ DTRs and 2+ pulses.  Lymph: No lymphadenopathy of posterior or anterior cervical chain or axillae bilaterally.  Gait normal  with good balance and coordination.  MSK:  Non tender with full range of motion and good stability and symmetric strength and tone of shoulders, elbows, wrist, hip, knee and ankles bilaterally.  Back Exam:  Inspection: Mild to moderate loss of lordosis Motion: Flexion 40 deg, Extension 35 deg, Side Bending to 25 deg bilaterally,  Rotation to 25 deg bilaterally  SLR laying: Negative  XSLR laying: Negative  Palpable tenderness: Diffuse tenderness noted in the paraspinal musculature from the cervical spine down to the sacrum.  Even to light palpation. FABER: Tightness bilaterally. Sensory change: Gross sensation intact to all lumbar and sacral dermatomes.  Reflexes: 2+ at both patellar tendons, 2+ at achilles tendons, Babinski's downgoing.  Strength at foot  Plantar-flexion: 5/5 Dorsi-flexion: 5/5 Eversion: 5/5 Inversion: 5/5  Leg strength  Quad: 5/5 Hamstring: 5/5 Hip flexor: 5/5 Hip abductors: 4/5 but symmetric Gait unremarkable.   Osteopathic findings C2 flexed rotated and side bent left  T3 extended rotated and side bent right inhaled third rib T9 extended rotated and side bent left L3 flexed rotated and side bent right Sacrum right on right    Impression and Recommendations:     This case required medical decision making of moderate complexity.      Note: This dictation was prepared with Dragon dictation along with smaller phrase technology. Any transcriptional errors that result from this process are unintentional.

## 2017-09-05 ENCOUNTER — Ambulatory Visit: Payer: Managed Care, Other (non HMO) | Admitting: Family Medicine

## 2017-09-05 ENCOUNTER — Encounter: Payer: Self-pay | Admitting: Family Medicine

## 2017-09-05 VITALS — BP 130/74 | HR 89 | Ht 72.0 in | Wt 245.0 lb

## 2017-09-05 DIAGNOSIS — M7918 Myalgia, other site: Secondary | ICD-10-CM | POA: Diagnosis not present

## 2017-09-05 DIAGNOSIS — M999 Biomechanical lesion, unspecified: Secondary | ICD-10-CM

## 2017-09-05 NOTE — Assessment & Plan Note (Signed)
Continues to have pain that is out of proportion.  Once again discussed that we will not refill any of his chronic narcotics.  Patient would have to go to a pain clinic.  Patient will continue his other medications.  We discussed with him about over-the-counter medications that could be beneficial.  Patient will follow-up with me again in 4 weeks

## 2017-09-05 NOTE — Assessment & Plan Note (Signed)
Decision today to treat with OMT was based on Physical Exam  After verbal consent patient was treated with HVLA, ME, FPR techniques in cervical, thoracic, rib, lumbar and sacral areas  Patient tolerated the procedure well with improvement in symptoms  Patient given exercises, stretches and lifestyle modifications  See medications in patient instructions if given  Patient will follow up in 4 weeks 

## 2017-09-05 NOTE — Patient Instructions (Signed)
Good to see you  Tuned you up again  Stay active See me again in

## 2017-09-21 DIAGNOSIS — M546 Pain in thoracic spine: Secondary | ICD-10-CM | POA: Diagnosis not present

## 2017-09-21 DIAGNOSIS — M999 Biomechanical lesion, unspecified: Secondary | ICD-10-CM | POA: Diagnosis not present

## 2017-09-21 DIAGNOSIS — M4200 Juvenile osteochondrosis of spine, site unspecified: Secondary | ICD-10-CM | POA: Diagnosis not present

## 2017-09-21 DIAGNOSIS — G8929 Other chronic pain: Secondary | ICD-10-CM | POA: Diagnosis not present

## 2017-09-26 NOTE — Progress Notes (Signed)
Tawana ScaleZach Aviyana Sonntag D.O. [facility] [address] [place], [place] [address] Phone: [phone] Subjective:     CC: Back pain  ZHY:[id number]:Subjective  [name] is a 27 y.o. male coming in with complaint of back pain. He went to [place] and feels that his back is tight due to flying. He also is having bilateral radicular pain that is causing his great toes to become numb, left greater than right.  Patient states that the back pain seems to be about the same just can be unrelenting at times.  No recent changes in medications      Past Medical History:  Diagnosis Date  . ADD (attention deficit disorder)   . Anxiety   . Narcolepsy through school   Past Surgical History:  Procedure Laterality Date  . KNEE SURGERY    . WISDOM TOOTH EXTRACTION  2013   Social History   Socioeconomic History  . Marital status: Single    Spouse name: Not on file  . Number of children: 0  . Years of education: Not on file  . Highest education level: Bachelor's degree (e.g., BA, AB, BS)  Occupational History    Employer: [employer]  Social Needs  . Financial resource strain: Not on file  . Food insecurity:    Worry: Not on file    Inability: Not on file  . Transportation needs:    Medical: Not on file    Non-medical: Not on file  Tobacco Use  . Smoking status: Never Smoker  . Smokeless tobacco: Never Used  Substance and Sexual Activity  . Alcohol use: Yes    Alcohol/week: 12.0 standard drinks    Types: 5 Shots of liquor, 7 Standard drinks or equivalent per week    Comment: Mostly just weekends  . Drug use: No  . Sexual activity: Yes    Birth control/protection: Condom  Lifestyle  . Physical activity:    Days per week: Not on file    Minutes per session: Not on file  . Stress: Not on file  Relationships  . Social connections:    Talks on phone: Not on file    Gets together: Not on file    Attends religious service: Not on file    Active member of club or organization: Not on  file    Attends meetings of clubs or organizations: Not on file    Relationship status: Not on file  Other Topics Concern  . Not on file  Social History Narrative   Caffeine 4-5 cups a day. Decreasing adderall has increased need for coffee.   Moved back in with [place] parents from [place], [place] in 02/2017 to address his chronic pain and disability more aggressively (leave of absence from work).   No Known Allergies Family History  Problem Relation Age of Onset  . Stroke Maternal Grandmother   . Cancer Paternal Grandmother        Breast  . Hypertension Paternal Grandmother   . Diabetes Paternal Grandfather   . Prostate cancer Father        and grandfather  . Other Neg Hx      Past medical history, social, surgical and family history all reviewed in electronic medical record.  No pertanent information unless stated regarding to the chief complaint.   Review of Systems:Review of systems updated and as accurate as of 09/27/17  No headache, visual changes, nausea, vomiting, diarrhea, constipation, dizziness, abdominal pain, skin rash, fevers, chills, night sweats, weight loss, swollen lymph nodes,  body aches, joint swelling, muscle aches, chest pain, shortness of breath, mood changes.   Objective  Blood pressure 112/78, height 6' (1.829 m), weight 249 lb (112.9 kg). Systems examined below as of 09/27/17   General: No apparent distress alert and oriented x3 mood and affect normal, dressed appropriately.  HEENT: Pupils equal, extraocular movements intact  Respiratory: Patient's speak in full sentences and does not appear short of breath  Cardiovascular: No lower extremity edema, non tender, no erythema  Skin: Warm dry intact with no signs of infection or rash on extremities or on axial skeleton.  Abdomen: Soft nontender  Neuro: Cranial nerves II through XII are intact, neurovascularly intact in all extremities with 2+ DTRs and 2+ pulses.  Lymph: No lymphadenopathy of posterior or anterior  cervical chain or axillae bilaterally.  Gait normal with good balance and coordination.  MSK:  Non tender with full range of motion and good stability and symmetric strength and tone of shoulders, elbows, wrist, hip, knee and ankles bilaterally.  Back Exam:  Inspection: Mild loss of lordosis Motion: Flexion 45 deg, Extension 35 deg, Side Bending to 45 deg bilaterally,  Rotation to 45 deg bilaterally  SLR laying: Negative  XSLR laying: Negative  Palpable tenderness: Tender to palpation in the paraspinal musculature of the lumbar spine. FABER: Tightness bilaterally within the paraspinal musculature Sensory change: Gross sensation intact to all lumbar and sacral dermatomes.  Reflexes: 2+ at both patellar tendons, 2+ at achilles tendons, Babinski's downgoing.  Strength at foot  Plantar-flexion: 5/5 Dorsi-flexion: 5/5 Eversion: 5/5 Inversion: 5/5  Leg strength  Quad: 5/5 Hamstring: 5/5 Hip flexor: 5/5 Hip abductors: 4/5 but symmetric Gait unremarkable.  Osteopathic findings  C2 flexed rotated and side bent right C7 flexed rotated and side bent left T9 extended rotated and side bent left L3 flexed rotated and side bent right Sacrum right on right    Impression and Recommendations:     This case required medical decision making of moderate complexity.      Note: This dictation was prepared with Dragon dictation along with smaller phrase technology. Any transcriptional errors that result from this process are unintentional.

## 2017-09-27 ENCOUNTER — Encounter: Payer: Self-pay | Admitting: Family Medicine

## 2017-09-27 ENCOUNTER — Ambulatory Visit (INDEPENDENT_AMBULATORY_CARE_PROVIDER_SITE_OTHER): Payer: BLUE CROSS/BLUE SHIELD | Admitting: Family Medicine

## 2017-09-27 VITALS — BP 112/78 | Ht 72.0 in | Wt 249.0 lb

## 2017-09-27 DIAGNOSIS — M5134 Other intervertebral disc degeneration, thoracic region: Secondary | ICD-10-CM

## 2017-09-27 DIAGNOSIS — M999 Biomechanical lesion, unspecified: Secondary | ICD-10-CM

## 2017-09-27 NOTE — Patient Instructions (Signed)
Good to see you  Keep pushing it  Ice is your friend.  Keep working your tail off See me again in 4-5 weeks

## 2017-09-27 NOTE — Assessment & Plan Note (Signed)
Degenerative changes.  Discussed icing regimen and home exercises.  Discussed which activities of doing which wants to avoid. Patient responding well to osteopathic manipulation.  Discussed with patient again at great length about continuing to try to work on muscle imbalances, discussed weight loss, patient is low testosterone we will check again with him being off the testosterone at next visit in 4 weeks

## 2017-09-27 NOTE — Assessment & Plan Note (Signed)
Decision today to treat with OMT was based on Physical Exam  After verbal consent patient was treated with HVLA, ME, FPR techniques in cervical, thoracic, rib, lumbar and sacral areas  Patient tolerated the procedure well with improvement in symptoms  Patient given exercises, stretches and lifestyle modifications  See medications in patient instructions if given  Patient will follow up in 4 weeks 

## 2017-09-28 DIAGNOSIS — M999 Biomechanical lesion, unspecified: Secondary | ICD-10-CM | POA: Diagnosis not present

## 2017-09-28 DIAGNOSIS — G4700 Insomnia, unspecified: Secondary | ICD-10-CM | POA: Diagnosis not present

## 2017-09-28 DIAGNOSIS — G8929 Other chronic pain: Secondary | ICD-10-CM | POA: Diagnosis not present

## 2017-09-28 DIAGNOSIS — M546 Pain in thoracic spine: Secondary | ICD-10-CM | POA: Diagnosis not present

## 2017-09-28 DIAGNOSIS — M545 Low back pain: Secondary | ICD-10-CM | POA: Diagnosis not present

## 2017-09-28 DIAGNOSIS — M4200 Juvenile osteochondrosis of spine, site unspecified: Secondary | ICD-10-CM | POA: Diagnosis not present

## 2017-09-28 DIAGNOSIS — F988 Other specified behavioral and emotional disorders with onset usually occurring in childhood and adolescence: Secondary | ICD-10-CM | POA: Diagnosis not present

## 2017-09-28 DIAGNOSIS — Z79891 Long term (current) use of opiate analgesic: Secondary | ICD-10-CM | POA: Diagnosis not present

## 2017-10-01 ENCOUNTER — Ambulatory Visit: Payer: BLUE CROSS/BLUE SHIELD | Admitting: Family Medicine

## 2017-10-01 ENCOUNTER — Encounter: Payer: Self-pay | Admitting: Family Medicine

## 2017-10-01 VITALS — BP 114/80 | HR 102 | Ht 72.0 in | Wt 243.0 lb

## 2017-10-01 DIAGNOSIS — M5134 Other intervertebral disc degeneration, thoracic region: Secondary | ICD-10-CM | POA: Diagnosis not present

## 2017-10-01 DIAGNOSIS — M999 Biomechanical lesion, unspecified: Secondary | ICD-10-CM | POA: Diagnosis not present

## 2017-10-01 MED ORDER — DULOXETINE HCL 20 MG PO CPEP: 20.0000 mg | ORAL_CAPSULE | Freq: Every day | ORAL | 1 refills | Status: DC

## 2017-10-01 MED ORDER — MELOXICAM 15 MG PO TABS: 15.0000 mg | ORAL_TABLET | Freq: Every day | ORAL | 0 refills | Status: DC

## 2017-10-01 NOTE — Patient Instructions (Addendum)
We tried it again  Meloxicam daily for 10 days then stop.  Try to do it when needed for 3 days at a time Cymbalta 20 mg daily  Easy this week  See me again in 3-4 weeks

## 2017-10-01 NOTE — Progress Notes (Signed)
Tawana ScaleZach Macey Wurtz D.O. [facility] [address] [place], [place] [address] Phone: [phone] Subjective:    I'm seeing this patient by the request  of:    CC: Back pain exacerbation  YQI:[id number]:Subjective  Anastasia PallSamuel J Winstead is a 27 y.o. male coming in with complaint of back pain. He feels that he is locked up again today and would like another adjustment for his hips.  Patient also states that he is out of work at this moment and would like to try to increase activity but is finding it difficult.  Patient is talked about the other medications with his primary care provider but could not remember the names.  Wondering to know what could be done possibly.      Past Medical History:  Diagnosis Date  . ADD (attention deficit disorder)   . Anxiety   . Narcolepsy through school   Past Surgical History:  Procedure Laterality Date  . KNEE SURGERY    . WISDOM TOOTH EXTRACTION  2013   Social History   Socioeconomic History  . Marital status: Single    Spouse name: Not on file  . Number of children: 0  . Years of education: Not on file  . Highest education level: Bachelor's degree (e.g., BA, AB, BS)  Occupational History    Employer: [employer]  Social Needs  . Financial resource strain: Not on file  . Food insecurity:    Worry: Not on file    Inability: Not on file  . Transportation needs:    Medical: Not on file    Non-medical: Not on file  Tobacco Use  . Smoking status: Never Smoker  . Smokeless tobacco: Never Used  Substance and Sexual Activity  . Alcohol use: Yes    Alcohol/week: 12.0 standard drinks    Types: 5 Shots of liquor, 7 Standard drinks or equivalent per week    Comment: Mostly just weekends  . Drug use: No  . Sexual activity: Yes    Birth control/protection: Condom  Lifestyle  . Physical activity:    Days per week: Not on file    Minutes per session: Not on file  . Stress: Not on file  Relationships  . Social connections:    Talks on phone: Not on file      Gets together: Not on file    Attends religious service: Not on file    Active member of club or organization: Not on file    Attends meetings of clubs or organizations: Not on file    Relationship status: Not on file  Other Topics Concern  . Not on file  Social History Narrative   Caffeine 4-5 cups a day. Decreasing adderall has increased need for coffee.   Moved back in with [place] from [place], [place] in 02/2017 to address his chronic pain and disability more aggressively (leave of absence from work).   No Known Allergies Family History  Problem Relation Age of Onset  . Stroke Maternal Grandmother   . Cancer Paternal Grandmother        Breast  . Hypertension Paternal Grandmother   . Diabetes Paternal Grandfather   . Prostate cancer Father        and grandfather  . Other Neg Hx      Past medical history, social, surgical and family history all reviewed in electronic medical record.  No pertanent information unless stated regarding to the chief complaint.   Review of Systems:Review of systems updated and as accurate as  of 10/01/17  No headache, visual changes, nausea, vomiting, diarrhea, constipation, dizziness, abdominal pain, skin rash, fevers, chills, night sweats, weight loss, swollen lymph nodes, , chest pain, shortness of breath, mood changes.  Positive muscle aches, body aches  Objective  Blood pressure 114/80, pulse (!) 102, height 6' (1.829 m), weight 243 lb (110.2 kg), SpO2 94 %. Systems examined below as of 10/01/17   General: No apparent distress alert and oriented x3 mood and affect normal, dressed appropriately.  HEENT: Pupils equal, extraocular movements intact  Respiratory: Patient's speak in full sentences and does not appear short of breath  Cardiovascular: No lower extremity edema, non tender, no erythema  Skin: Warm dry intact with no signs of infection or rash on extremities or on axial skeleton.  Abdomen: Soft nontender  Neuro: Cranial nerves II  through XII are intact, neurovascularly intact in all extremities with 2+ DTRs and 2+ pulses.  Lymph: No lymphadenopathy of posterior or anterior cervical chain or axillae bilaterally.  Gait normal with good balance and coordination.  MSK:  Non tender with full range of motion and good stability and symmetric strength and tone of shoulders, elbows, wrist, hip, knee and ankles bilaterally.  Back exam shows some loss of lordosis of the lumbar spine and some increasing tightness from previous exam.  Patient has negative straight leg test but some tightness of the hamstrings bilaterally.  Mild positive Faber test on the left side.  Some tightness in the thoracolumbar juncture.  Osteopathic findings C2 flexed rotated and side bent right C6 flexed rotated and side bent left T3 extended rotated and side bent right inhaled third rib T6 extended rotated and side bent left L2 flexed rotated and side bent right Sacrum right on right     Impression and Recommendations:     This case required medical decision making of moderate complexity.      Note: This dictation was prepared with Dragon dictation along with smaller phrase technology. Any transcriptional errors that result from this process are unintentional.

## 2017-10-01 NOTE — Assessment & Plan Note (Signed)
Minimal overall.  Patient consented on chronic pain.  Started on meloxicam per primary care recommendations which seem to help in the past.  Patient also start on the low-dose of Cymbalta today.  I believe that this could be beneficial.  Encourage patient to continue the trazodone at night.  Continue the home exercises but take it a little easier this week.  Patient will follow-up with me again for his regular scheduled manipulation in 3 to 4 weeks

## 2017-10-01 NOTE — Assessment & Plan Note (Signed)
Decision today to treat with OMT was based on Physical Exam  After verbal consent patient was treated with HVLA, ME, FPR techniques in cervical, thoracic, rib, lumbar and sacral areas  Patient tolerated the procedure well with improvement in symptoms  Patient given exercises, stretches and lifestyle modifications  See medications in patient instructions if given  Patient will follow up in 3-4 weeks 

## 2017-10-05 DIAGNOSIS — M546 Pain in thoracic spine: Secondary | ICD-10-CM | POA: Diagnosis not present

## 2017-10-05 DIAGNOSIS — M999 Biomechanical lesion, unspecified: Secondary | ICD-10-CM | POA: Diagnosis not present

## 2017-10-05 DIAGNOSIS — G8929 Other chronic pain: Secondary | ICD-10-CM | POA: Diagnosis not present

## 2017-10-05 DIAGNOSIS — M4200 Juvenile osteochondrosis of spine, site unspecified: Secondary | ICD-10-CM | POA: Diagnosis not present

## 2017-10-08 DIAGNOSIS — M4200 Juvenile osteochondrosis of spine, site unspecified: Secondary | ICD-10-CM | POA: Diagnosis not present

## 2017-10-08 DIAGNOSIS — G8929 Other chronic pain: Secondary | ICD-10-CM | POA: Diagnosis not present

## 2017-10-08 DIAGNOSIS — M999 Biomechanical lesion, unspecified: Secondary | ICD-10-CM | POA: Diagnosis not present

## 2017-10-08 DIAGNOSIS — M546 Pain in thoracic spine: Secondary | ICD-10-CM | POA: Diagnosis not present

## 2017-10-12 DIAGNOSIS — G8929 Other chronic pain: Secondary | ICD-10-CM | POA: Diagnosis not present

## 2017-10-12 DIAGNOSIS — M999 Biomechanical lesion, unspecified: Secondary | ICD-10-CM | POA: Diagnosis not present

## 2017-10-12 DIAGNOSIS — M546 Pain in thoracic spine: Secondary | ICD-10-CM | POA: Diagnosis not present

## 2017-10-12 DIAGNOSIS — M4200 Juvenile osteochondrosis of spine, site unspecified: Secondary | ICD-10-CM | POA: Diagnosis not present

## 2017-10-19 DIAGNOSIS — M4200 Juvenile osteochondrosis of spine, site unspecified: Secondary | ICD-10-CM | POA: Diagnosis not present

## 2017-10-19 DIAGNOSIS — G8929 Other chronic pain: Secondary | ICD-10-CM | POA: Diagnosis not present

## 2017-10-19 DIAGNOSIS — M999 Biomechanical lesion, unspecified: Secondary | ICD-10-CM | POA: Diagnosis not present

## 2017-10-19 DIAGNOSIS — M546 Pain in thoracic spine: Secondary | ICD-10-CM | POA: Diagnosis not present

## 2017-10-21 NOTE — Progress Notes (Signed)
[name] [address] [place], [place] [address] Phone: [phone] Subjective:    I [name] am serving as a Neurosurgeon for Dr. Antoine Primas.   CC: Back pain follow-up  [id number]  Frank [name] is a 27 y.o. male coming in with complaint of back pain. States the back is doing fine. Hip is doing better but still painful with sitting and laying down. Here for manipulation.  Patient has been doing relatively well.  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  Patient is not taking any pain medications at the time.  Still has times where his back pain is in agony and stops him from all activities.  Patient though states that overall he does think he is improving slowly.  Patient is excited about a trip he is taking soon and will be flying there instead of driving which she usually does but unfortunately sitting in 1 position for a long amount of time causes more discomfort and pain now.     Past Medical History:  Diagnosis Date  . ADD (attention deficit disorder)   . Anxiety   . Narcolepsy through school   Past Surgical History:  Procedure Laterality Date  . KNEE SURGERY    . WISDOM TOOTH EXTRACTION  2013   Social History   Socioeconomic History  . Marital status: Single    Spouse name: Not on file  . Number of children: 0  . Years of education: Not on file  . Highest education level: Bachelor's degree (e.g., BA, AB, BS)  Occupational History    Employer: [employer]  Social Needs  . Financial resource strain: Not on file  . Food insecurity:    Worry: Not on file    Inability: Not on file  . Transportation needs:    Medical: Not on file    Non-medical: Not on file  Tobacco Use  . Smoking status: Never Smoker  . Smokeless tobacco: Never Used  Substance and Sexual Activity  . Alcohol use: Yes    Alcohol/week: 12.0 standard drinks    Types: 5 Shots of liquor, 7 Standard drinks or equivalent per week    Comment:  Mostly just weekends  . Drug use: No  . Sexual activity: Yes    Birth control/protection: Condom  Lifestyle  . Physical activity:    Days per week: Not on file    Minutes per session: Not on file  . Stress: Not on file  Relationships  . Social connections:    Talks on phone: Not on file    Gets together: Not on file    Attends religious service: Not on file    Active member of club or organization: Not on file    Attends meetings of clubs or organizations: Not on file    Relationship status: Not on file  Other Topics Concern  . Not on file  Social History Narrative   Caffeine 4-5 cups a day. Decreasing adderall has increased need for coffee.   Moved back in with his parents from [place], [place] in 02/2017 to address his chronic pain and disability more aggressively (leave of absence from work).   No Known Allergies Family History  Problem Relation Age of Onset  . Stroke Maternal Grandmother   . Cancer Paternal Grandmother        Breast  . Hypertension Paternal Grandmother   . Diabetes Paternal Grandfather   . Prostate cancer Father  and grandfather  . Other Neg Hx       Current Outpatient Medications (Respiratory):  .  albuterol (PROVENTIL HFA;VENTOLIN HFA) 108 (90 Base) MCG/ACT inhaler, Inhale 2 puffs into the lungs every 4 (four) hours as needed for wheezing or shortness of breath (cough, shortness of breath or wheezing.).  Current Outpatient Medications (Analgesics):  .  meloxicam (MOBIC) 15 MG tablet, Take 1 tablet (15 mg total) by mouth daily.   Current Outpatient Medications (Other):  Marland Kitchen  buPROPion (WELLBUTRIN XL) 300 MG 24 hr tablet, Take 1 tablet (300 mg total) by mouth daily. .  DULoxetine (CYMBALTA) 20 MG capsule, Take 1 capsule (20 mg total) by mouth daily. .  traZODone (DESYREL) 50 MG tablet, Take 1-2 tablets (50-100 mg total) by mouth at bedtime as needed for sleep. .  Vitamin D, Ergocalciferol, (DRISDOL) 50000 units CAPS capsule, Take 1 capsule (50,000  Units total) by mouth every 7 (seven) days. Marland Kitchen  zolpidem (AMBIEN) 10 MG tablet, Take 1 tablet (10 mg total) by mouth at bedtime as needed. Marland Kitchen  amphetamine-dextroamphetamine (ADDERALL) 15 MG tablet, Take 1 tablet by mouth 2 (two) times daily.    Past medical history, social, surgical and family history all reviewed in electronic medical record.  No pertanent information unless stated regarding to the chief complaint.   Review of Systems:  No headache, visual changes, nausea, vomiting, diarrhea, constipation, dizziness, abdominal pain, skin rash, fevers, chills, night sweats, weight loss, swollen lymph nodes,chest pain, shortness of breath, mood changes.  Positive muscle aches and body aches  Objective  Blood pressure 100/70, pulse 82, height 6' (1.829 m), weight 254 lb (115.2 kg), SpO2 98 %.    General: No apparent distress alert and oriented x3 mood and affect normal, dressed appropriately.  HEENT: Pupils equal, extraocular movements intact  Respiratory: Patient's speak in full sentences and does not appear short of breath  Cardiovascular: No lower extremity edema, non tender, no erythema  Skin: Warm dry intact with no signs of infection or rash on extremities or on axial skeleton.  Abdomen: Soft nontender  Neuro: Cranial nerves II through XII are intact, neurovascularly intact in all extremities with 2+ DTRs and 2+ pulses.  Lymph: No lymphadenopathy of posterior or anterior cervical chain or axillae bilaterally.  Gait normal with good balance and coordination.  MSK:  Non tender with full range of motion and good stability and symmetric strength and tone of shoulders, elbows, wrist, hip, knee and ankles bilaterally.  Back Exam:  Inspection: Mild loss of lordosis Motion: Flexion 40 deg, Extension 25 deg, Side Bending to 35 deg bilaterally,  Rotation to 35 deg bilaterally  SLR laying: Negative  XSLR laying: Negative  Palpable tenderness: Tender to palpation in the paraspinal musculature  diffusely of the lumbar spine from the thoracolumbar all the way to the lumbosacral area. FABER: Tightness on the left. Sensory change: Gross sensation intact to all lumbar and sacral dermatomes.  Reflexes: 2+ at both patellar tendons, 2+ at achilles tendons, Babinski's downgoing.  Strength at foot  Plantar-flexion: 5/5 Dorsi-flexion: 5/5 Eversion: 5/5 Inversion: 5/5  Leg strength  Quad: 5/5 Hamstring: 5/5 Hip flexor: 5/5 Hip abductors: 4/5 but symmetric  Osteopathic findings C2 flexed rotated and side bent right C4 flexed rotated and side bent left T3 extended rotated and side bent right inhaled third rib T5 extended rotated and side bent left L3 flexed rotated and side bent right Sacrum left on left     Impression and Recommendations:     This  case required medical decision making of moderate complexity. The above documentation has been reviewed and is accurate and complete Lyndal Pulley, DO       Note: This dictation was prepared with Dragon dictation along with smaller phrase technology. Any transcriptional errors that result from this process are unintentional.

## 2017-10-22 DIAGNOSIS — G8929 Other chronic pain: Secondary | ICD-10-CM | POA: Diagnosis not present

## 2017-10-22 DIAGNOSIS — M4200 Juvenile osteochondrosis of spine, site unspecified: Secondary | ICD-10-CM | POA: Diagnosis not present

## 2017-10-22 DIAGNOSIS — M999 Biomechanical lesion, unspecified: Secondary | ICD-10-CM | POA: Diagnosis not present

## 2017-10-22 DIAGNOSIS — M546 Pain in thoracic spine: Secondary | ICD-10-CM | POA: Diagnosis not present

## 2017-10-23 ENCOUNTER — Ambulatory Visit (INDEPENDENT_AMBULATORY_CARE_PROVIDER_SITE_OTHER): Payer: BLUE CROSS/BLUE SHIELD | Admitting: Family Medicine

## 2017-10-23 ENCOUNTER — Encounter: Payer: Self-pay | Admitting: Family Medicine

## 2017-10-23 VITALS — BP 100/70 | HR 82 | Ht 72.0 in | Wt 254.0 lb

## 2017-10-23 DIAGNOSIS — M999 Biomechanical lesion, unspecified: Secondary | ICD-10-CM

## 2017-10-23 DIAGNOSIS — M4200 Juvenile osteochondrosis of spine, site unspecified: Secondary | ICD-10-CM | POA: Diagnosis not present

## 2017-10-23 NOTE — Assessment & Plan Note (Signed)
Patient has this involves myofascial pain as well as likely chronic pain.  We discussed icing regimen.  Has been trying to be active.  Patient did not take the Cymbalta that was prescribed at last exam.  Discussed posture and ergonomics.  Discussed which activities to do which wants to avoid.  Discussed meloxicam.  Follow-up again in 4 to 8 weeks

## 2017-10-23 NOTE — Patient Instructions (Signed)
Good to see you  Good luck on the hunting.  Cymbalta 20 mg daily when you are ready  Lets hold on the injections for now.  Keep it up  See me again in 4 weeks

## 2017-10-23 NOTE — Assessment & Plan Note (Signed)
Decision today to treat with OMT was based on Physical Exam  After verbal consent patient was treated with HVLA, ME, FPR techniques in cervical, thoracic, rib,  lumbar and sacral areas  Patient tolerated the procedure well with improvement in symptoms  Patient given exercises, stretches and lifestyle modifications  See medications in patient instructions if given  Patient will follow up in 4-8 weeks 

## 2017-10-24 ENCOUNTER — Ambulatory Visit (INDEPENDENT_AMBULATORY_CARE_PROVIDER_SITE_OTHER): Payer: BLUE CROSS/BLUE SHIELD | Admitting: Family Medicine

## 2017-10-24 ENCOUNTER — Encounter: Payer: Self-pay | Admitting: Family Medicine

## 2017-10-24 DIAGNOSIS — M999 Biomechanical lesion, unspecified: Secondary | ICD-10-CM

## 2017-10-24 DIAGNOSIS — M25512 Pain in left shoulder: Secondary | ICD-10-CM

## 2017-10-24 MED ORDER — PREDNISONE 50 MG PO TABS: 50.0000 mg | ORAL_TABLET | Freq: Every day | ORAL | 0 refills | Status: DC

## 2017-10-24 NOTE — Progress Notes (Signed)
[name] [address] [place], [place] [address] Phone: [phone] Subjective:    I Frank [name] am serving as a Neurosurgeon for Dr. Antoine Primas.    CC: Neck pain  [id number]  Frank [name] is a 27 y.o. male coming in with complaint of neck pain. Neck spasm last night. Spasm is causing a tension headache. Pain radiates down the back and arm. Neck stiff.   Onset- Acute  Location- Left  Duration-  Character- Spasm Aggravating factors- Reliving factors-stretching helps moderately Therapies tried-icing and stretching Severity-7 out of 10     Past Medical History:  Diagnosis Date  . ADD (attention deficit disorder)   . Anxiety   . Narcolepsy through school   Past Surgical History:  Procedure Laterality Date  . KNEE SURGERY    . WISDOM TOOTH EXTRACTION  2013   Social History   Socioeconomic History  . Marital status: Single    Spouse name: Not on file  . Number of children: 0  . Years of education: Not on file  . Highest education level: Bachelor's degree (e.g., BA, AB, BS)  Occupational History    Employer: [employer]  Social Needs  . Financial resource strain: Not on file  . Food insecurity:    Worry: Not on file    Inability: Not on file  . Transportation needs:    Medical: Not on file    Non-medical: Not on file  Tobacco Use  . Smoking status: Never Smoker  . Smokeless tobacco: Never Used  Substance and Sexual Activity  . Alcohol use: Yes    Alcohol/week: 12.0 standard drinks    Types: 5 Shots of liquor, 7 Standard drinks or equivalent per week    Comment: Mostly just weekends  . Drug use: No  . Sexual activity: Yes    Birth control/protection: Condom  Lifestyle  . Physical activity:    Days per week: Not on file    Minutes per session: Not on file  . Stress: Not on file  Relationships  . Social connections:    Talks on phone: Not on file    Gets together: Not on file    Attends religious service: Not on file      Active member of club or organization: Not on file    Attends meetings of clubs or organizations: Not on file    Relationship status: Not on file  Other Topics Concern  . Not on file  Social History Narrative   Caffeine 4-5 cups a day. Decreasing adderall has increased need for coffee.   Moved back in with his parents from [place], [place] in 02/2017 to address his chronic pain and disability more aggressively (leave of absence from work).   No Known Allergies Family History  Problem Relation Age of Onset  . Stroke Maternal Grandmother   . Cancer Paternal Grandmother        Breast  . Hypertension Paternal Grandmother   . Diabetes Paternal Grandfather   . Prostate cancer Father        and grandfather  . Other Neg Hx     Current Outpatient Medications (Endocrine & Metabolic):  .  predniSONE (DELTASONE) 50 MG tablet, Take 1 tablet (50 mg total) by mouth daily.   Current Outpatient Medications (Respiratory):  .  albuterol (PROVENTIL HFA;VENTOLIN HFA) 108 (90 Base) MCG/ACT inhaler, Inhale 2 puffs into the lungs every 4 (four) hours as needed for wheezing or shortness of breath (cough, shortness of breath  or wheezing.).  Current Outpatient Medications (Analgesics):  .  meloxicam (MOBIC) 15 MG tablet, Take 1 tablet (15 mg total) by mouth daily.   Current Outpatient Medications (Other):  Marland Kitchen  buPROPion (WELLBUTRIN XL) 300 MG 24 hr tablet, Take 1 tablet (300 mg total) by mouth daily. .  DULoxetine (CYMBALTA) 20 MG capsule, Take 1 capsule (20 mg total) by mouth daily. .  traZODone (DESYREL) 50 MG tablet, Take 1-2 tablets (50-100 mg total) by mouth at bedtime as needed for sleep. .  Vitamin D, Ergocalciferol, (DRISDOL) 50000 units CAPS capsule, Take 1 capsule (50,000 Units total) by mouth every 7 (seven) days. Marland Kitchen  zolpidem (AMBIEN) 10 MG tablet, Take 1 tablet (10 mg total) by mouth at bedtime as needed. Marland Kitchen  amphetamine-dextroamphetamine (ADDERALL) 15 MG tablet, Take 1 tablet by mouth 2 (two)  times daily.    Past medical history, social, surgical and family history all reviewed in electronic medical record.  No pertanent information unless stated regarding to the chief complaint.   Review of Systems:  No headache, visual changes, nausea, vomiting, diarrhea, constipation, dizziness, abdominal pain, skin rash, fevers, chills, night sweats, weight loss, swollen lymph nodes, body aches, joint swelling, chest pain, shortness of breath, mood changes.  Positive muscle aches  Objective  Blood pressure 140/80, pulse 73, height 6' (1.829 m), weight 254 lb (115.2 kg), SpO2 98 %.    General: No apparent distress alert and oriented x3 mood and affect normal, dressed appropriately.  HEENT: Pupils equal, extraocular movements intact  Respiratory: Patient's speak in full sentences and does not appear short of breath  Cardiovascular: No lower extremity edema, non tender, no erythema  Skin: Warm dry intact with no signs of infection or rash on extremities or on axial skeleton.  Abdomen: Soft nontender  Neuro: Cranial nerves II through XII are intact, neurovascularly intact in all extremities with 2+ DTRs and 2+ pulses.  Lymph: No lymphadenopathy of posterior or anterior cervical chain or axillae bilaterally.  Gait normal with good balance and coordination.  MSK:  tender with full range of motion and good stability and symmetric strength and tone of shoulders, elbows, wrist, hip, knee and ankles bilaterally.  Neck: Inspection loss of lordosis. No palpable stepoffs. Negative Spurling's maneuver. Limited left side and rotation and sidebending Grip strength and sensation normal in bilateral hands Strength good C4 to T1 distribution No sensory change to C4 to T1 Negative Hoffman sign bilaterally Reflexes normal Left trapezius tightness with multiple trigger points  Osteopathic findings C2 flexed rotated and side bent right C4 flexed rotated and side bent left C6 flexed rotated and side  bent left  After verbal consent patient was prepped with alcohol swabs and with a 25-gauge half inch needle patient was injected with 0.5 cc of 0.5% Marcaine and 1 cc of Kenalog 40 mg/mL into 3 distinct trigger points.  This is in the left shoulder girdle.  No blood loss.  Postinjection instructions given    Impression and Recommendations:     This case required medical decision making of moderate complexity. The above documentation has been reviewed and is accurate and complete [name], DO       Note: This dictation was prepared with Dragon dictation along with smaller phrase technology. Any transcriptional errors that result from this process are unintentional.

## 2017-10-24 NOTE — Patient Instructions (Signed)
Good to see you  Ice is your friend Trigger point injections given  Ice is your friend Stay active  Prednisone starting tomorrow if not better Enjoy the trip  See me again in 4 weeks

## 2017-10-24 NOTE — Assessment & Plan Note (Signed)
Injection given, discussed icing regimen and home exercises.  Discussed which activities to do which wants to avoid.  Increase activity as tolerated.  Follow-up again in 4 to 8 weeks

## 2017-10-24 NOTE — Assessment & Plan Note (Signed)
Decision today to treat with OMT was based on Physical Exam  After verbal consent patient was treated with HVLA, ME, FPR techniques in cervical,  areas  Patient tolerated the procedure well with improvement in symptoms  Patient given exercises, stretches and lifestyle modifications  See medications in patient instructions if given  Patient will follow up in 4-8 weeks

## 2017-10-25 ENCOUNTER — Ambulatory Visit: Payer: BLUE CROSS/BLUE SHIELD | Admitting: Family Medicine

## 2017-10-29 DIAGNOSIS — G8929 Other chronic pain: Secondary | ICD-10-CM | POA: Diagnosis not present

## 2017-10-29 DIAGNOSIS — M4200 Juvenile osteochondrosis of spine, site unspecified: Secondary | ICD-10-CM | POA: Diagnosis not present

## 2017-10-29 DIAGNOSIS — M546 Pain in thoracic spine: Secondary | ICD-10-CM | POA: Diagnosis not present

## 2017-10-29 DIAGNOSIS — M999 Biomechanical lesion, unspecified: Secondary | ICD-10-CM | POA: Diagnosis not present

## 2017-11-02 DIAGNOSIS — F988 Other specified behavioral and emotional disorders with onset usually occurring in childhood and adolescence: Secondary | ICD-10-CM | POA: Diagnosis not present

## 2017-11-02 DIAGNOSIS — G4700 Insomnia, unspecified: Secondary | ICD-10-CM | POA: Diagnosis not present

## 2017-11-02 DIAGNOSIS — Z79891 Long term (current) use of opiate analgesic: Secondary | ICD-10-CM | POA: Diagnosis not present

## 2017-11-02 DIAGNOSIS — M545 Low back pain: Secondary | ICD-10-CM | POA: Diagnosis not present

## 2017-11-09 DIAGNOSIS — M999 Biomechanical lesion, unspecified: Secondary | ICD-10-CM | POA: Diagnosis not present

## 2017-11-09 DIAGNOSIS — G8929 Other chronic pain: Secondary | ICD-10-CM | POA: Diagnosis not present

## 2017-11-09 DIAGNOSIS — M546 Pain in thoracic spine: Secondary | ICD-10-CM | POA: Diagnosis not present

## 2017-11-09 DIAGNOSIS — M4200 Juvenile osteochondrosis of spine, site unspecified: Secondary | ICD-10-CM | POA: Diagnosis not present

## 2017-11-12 DIAGNOSIS — G8929 Other chronic pain: Secondary | ICD-10-CM | POA: Diagnosis not present

## 2017-11-12 DIAGNOSIS — M4200 Juvenile osteochondrosis of spine, site unspecified: Secondary | ICD-10-CM | POA: Diagnosis not present

## 2017-11-12 DIAGNOSIS — M999 Biomechanical lesion, unspecified: Secondary | ICD-10-CM | POA: Diagnosis not present

## 2017-11-12 DIAGNOSIS — M546 Pain in thoracic spine: Secondary | ICD-10-CM | POA: Diagnosis not present

## 2017-11-19 DIAGNOSIS — G8929 Other chronic pain: Secondary | ICD-10-CM | POA: Diagnosis not present

## 2017-11-19 DIAGNOSIS — M999 Biomechanical lesion, unspecified: Secondary | ICD-10-CM | POA: Diagnosis not present

## 2017-11-19 DIAGNOSIS — M546 Pain in thoracic spine: Secondary | ICD-10-CM | POA: Diagnosis not present

## 2017-11-19 DIAGNOSIS — M4200 Juvenile osteochondrosis of spine, site unspecified: Secondary | ICD-10-CM | POA: Diagnosis not present

## 2017-11-20 NOTE — Progress Notes (Signed)
[name] [address] [place], [place] [address] Phone: [phone] Subjective:   Frank [name], am serving as a scribe for Dr. Antoine Primas.   CC: Back pain and neck pain follow-up  [id number]  Frank [name] is a 27 y.o. male coming in with complaint of back pain. Has good and bad days. Is still doing PT which does help.  Patient states not as much severe pain.  Patient though states that the pain is never without pain though.  Patient has started taking the Cymbalta on a more regular basis.  No other significant medication changes.  Has noticed that his weight has been fluctuating though significantly     Past Medical History:  Diagnosis Date  . ADD (attention deficit disorder)   . Anxiety   . Narcolepsy through school   Past Surgical History:  Procedure Laterality Date  . KNEE SURGERY    . WISDOM TOOTH EXTRACTION  2013   Social History   Socioeconomic History  . Marital status: Single    Spouse name: Not on file  . Number of children: 0  . Years of education: Not on file  . Highest education level: Bachelor's degree (e.g., BA, AB, BS)  Occupational History    Employer: [employer]  Social Needs  . Financial resource strain: Not on file  . Food insecurity:    Worry: Not on file    Inability: Not on file  . Transportation needs:    Medical: Not on file    Non-medical: Not on file  Tobacco Use  . Smoking status: Never Smoker  . Smokeless tobacco: Never Used  Substance and Sexual Activity  . Alcohol use: Yes    Alcohol/week: 12.0 standard drinks    Types: 5 Shots of liquor, 7 Standard drinks or equivalent per week    Comment: Mostly just weekends  . Drug use: No  . Sexual activity: Yes    Birth control/protection: Condom  Lifestyle  . Physical activity:    Days per week: Not on file    Minutes per session: Not on file  . Stress: Not on file  Relationships  . Social connections:    Talks on phone: Not on file    Gets  together: Not on file    Attends religious service: Not on file    Active member of club or organization: Not on file    Attends meetings of clubs or organizations: Not on file    Relationship status: Not on file  Other Topics Concern  . Not on file  Social History Narrative   Caffeine 4-5 cups a day. Decreasing adderall has increased need for coffee.   Moved back in with his parents from [place], [place] in 02/2017 to address his chronic pain and disability more aggressively (leave of absence from work).   No Known Allergies Family History  Problem Relation Age of Onset  . Stroke Maternal Grandmother   . Cancer Paternal Grandmother        Breast  . Hypertension Paternal Grandmother   . Diabetes Paternal Grandfather   . Prostate cancer Father        and grandfather  . Other Neg Hx     Current Outpatient Medications (Endocrine & Metabolic):  .  predniSONE (DELTASONE) 50 MG tablet, Take 1 tablet (50 mg total) by mouth daily.   Current Outpatient Medications (Respiratory):  .  albuterol (PROVENTIL HFA;VENTOLIN HFA) 108 (90 Base) MCG/ACT inhaler, Inhale 2 puffs into the  lungs every 4 (four) hours as needed for wheezing or shortness of breath (cough, shortness of breath or wheezing.).  Current Outpatient Medications (Analgesics):  .  meloxicam (MOBIC) 15 MG tablet, Take 1 tablet (15 mg total) by mouth daily.   Current Outpatient Medications (Other):  .  amphetamine-dextroamphetamine (ADDERALL) 15 MG tablet, Take 1 tablet by mouth 2 (two) times daily. Marland Kitchen  buPROPion (WELLBUTRIN XL) 300 MG 24 hr tablet, Take 1 tablet (300 mg total) by mouth daily. .  DULoxetine (CYMBALTA) 20 MG capsule, Take 1 capsule (20 mg total) by mouth daily. .  traZODone (DESYREL) 50 MG tablet, Take 1-2 tablets (50-100 mg total) by mouth at bedtime as needed for sleep. .  Vitamin D, Ergocalciferol, (DRISDOL) 50000 units CAPS capsule, Take 1 capsule (50,000 Units total) by mouth every 7 (seven) days. Marland Kitchen  zolpidem  (AMBIEN) 10 MG tablet, Take 1 tablet (10 mg total) by mouth at bedtime as needed.    Past medical history, social, surgical and family history all reviewed in electronic medical record.  No pertanent information unless stated regarding to the chief complaint.   Review of Systems:  No headache, visual changes, nausea, vomiting, diarrhea, constipation, dizziness, abdominal pain, skin rash, fevers, chills, night sweats, weight loss, swollen lymph nodes, body aches, joint swelling,  chest pain, shortness of breath, mood changes.  Positive muscle aches  Objective  Blood pressure 124/88, pulse 83, height 6' (1.829 m), weight 262 lb (118.8 kg), SpO2 97 %.    General: No apparent distress alert and oriented x3 mood and affect normal, dressed appropriately.  HEENT: Pupils equal, extraocular movements intact  Respiratory: Patient's speak in full sentences and does not appear short of breath  Cardiovascular: No lower extremity edema, non tender, no erythema  Skin: Warm dry intact with no signs of infection or rash on extremities or on axial skeleton.  Abdomen: Soft nontender overweight Neuro: Cranial nerves II through XII are intact, neurovascularly intact in all extremities with 2+ DTRs and 2+ pulses.  Lymph: No lymphadenopathy of posterior or anterior cervical chain or axillae bilaterally.  Gait normal with good balance and coordination.  MSK:  tender with full range of motion and good stability and symmetric strength and tone of shoulders, elbows, wrist, hip, knee and ankles bilaterally.   Back exam shows loss of lordosis.  significant discomfort to palpation diffusely.  Patient has pain in the paraspinal musculature lumbar spine bilaterally.  More over the sacroiliac joints in the thoracolumbar juncture.  Osteopathic findings C2 flexed rotated and side bent left  T3 extended rotated and side bent right inhaled third rib T7 extended rotated and side bent left L4 flexed rotated and side bent  right Sacrum right on right     Impression and Recommendations:     This case required medical decision making of moderate complexity. The above documentation has been reviewed and is accurate and complete [name], DO       Note: This dictation was prepared with Dragon dictation along with smaller phrase technology. Any transcriptional errors that result from this process are unintentional.

## 2017-11-22 ENCOUNTER — Encounter: Payer: Self-pay | Admitting: Family Medicine

## 2017-11-22 ENCOUNTER — Other Ambulatory Visit (INDEPENDENT_AMBULATORY_CARE_PROVIDER_SITE_OTHER): Payer: BLUE CROSS/BLUE SHIELD

## 2017-11-22 ENCOUNTER — Ambulatory Visit: Payer: Self-pay | Admitting: Family Medicine

## 2017-11-22 VITALS — BP 124/88 | HR 83 | Ht 72.0 in | Wt 262.0 lb

## 2017-11-22 DIAGNOSIS — M4200 Juvenile osteochondrosis of spine, site unspecified: Secondary | ICD-10-CM

## 2017-11-22 DIAGNOSIS — M9902 Segmental and somatic dysfunction of thoracic region: Secondary | ICD-10-CM

## 2017-11-22 DIAGNOSIS — M2550 Pain in unspecified joint: Secondary | ICD-10-CM | POA: Diagnosis not present

## 2017-11-22 DIAGNOSIS — M9908 Segmental and somatic dysfunction of rib cage: Secondary | ICD-10-CM

## 2017-11-22 DIAGNOSIS — M9901 Segmental and somatic dysfunction of cervical region: Secondary | ICD-10-CM

## 2017-11-22 DIAGNOSIS — M999 Biomechanical lesion, unspecified: Secondary | ICD-10-CM

## 2017-11-22 DIAGNOSIS — M9904 Segmental and somatic dysfunction of sacral region: Secondary | ICD-10-CM

## 2017-11-22 DIAGNOSIS — M9903 Segmental and somatic dysfunction of lumbar region: Secondary | ICD-10-CM

## 2017-11-22 LAB — COMPREHENSIVE METABOLIC PANEL
ALT: 91 U/L — ABNORMAL HIGH (ref 0–53)
AST: 42 U/L — ABNORMAL HIGH (ref 0–37)
Albumin: 4.1 g/dL (ref 3.5–5.2)
Alkaline Phosphatase: 39 U/L (ref 39–117)
BUN: 26 mg/dL — AB (ref 6–23)
CHLORIDE: 107 meq/L (ref 96–112)
CO2: 26 meq/L (ref 19–32)
Calcium: 8.7 mg/dL (ref 8.4–10.5)
Creatinine, Ser: 0.96 mg/dL (ref 0.40–1.50)
GFR: 100.16 mL/min (ref >60.00)
GLUCOSE: 95 mg/dL (ref 70–99)
POTASSIUM: 3.9 meq/L (ref 3.5–5.1)
SODIUM: 140 meq/L (ref 135–145)
Total Bilirubin: 0.6 mg/dL (ref 0.2–1.2)
Total Protein: 6.7 g/dL (ref 6.0–8.3)

## 2017-11-22 LAB — TSH: TSH: 3.43 u[IU]/mL (ref 0.35–4.50)

## 2017-11-22 LAB — HEMOGLOBIN A1C: HEMOGLOBIN A1C: 5.1 % (ref 4.6–6.5)

## 2017-11-22 LAB — TESTOSTERONE: TESTOSTERONE: 318.09 ng/dL (ref 300.00–890.00)

## 2017-11-22 NOTE — Assessment & Plan Note (Signed)
Decision today to treat with OMT was based on Physical Exam  After verbal consent patient was treated with HVLA, ME, FPR techniques in cervical, thoracic, rib,  lumbar and sacral areas  Patient tolerated the procedure well with improvement in symptoms  Patient given exercises, stretches and lifestyle modifications  See medications in patient instructions if given  Patient will follow up in 4-8 weeks 

## 2017-11-22 NOTE — Assessment & Plan Note (Signed)
Chronic pain overall, with recent weight gain.  Will check testosterone again, continue the Cymbalta, check for A1c secondary to some mild polyuria and polydipsia patient states.  Responded well to manipulation.  Encourage weight loss and monitoring food again.  Follow-up again in 4 to 8 weeks

## 2017-11-22 NOTE — Patient Instructions (Signed)
Good to se eyou  Overall not terrible We will check labs Ice is your friend Continue with dry needling on the right No change in meds See me again in 4-5 weeks

## 2017-11-23 DIAGNOSIS — M4200 Juvenile osteochondrosis of spine, site unspecified: Secondary | ICD-10-CM | POA: Diagnosis not present

## 2017-11-23 DIAGNOSIS — G8929 Other chronic pain: Secondary | ICD-10-CM | POA: Diagnosis not present

## 2017-11-23 DIAGNOSIS — M999 Biomechanical lesion, unspecified: Secondary | ICD-10-CM | POA: Diagnosis not present

## 2017-11-23 DIAGNOSIS — M546 Pain in thoracic spine: Secondary | ICD-10-CM | POA: Diagnosis not present

## 2017-11-26 ENCOUNTER — Other Ambulatory Visit: Payer: Self-pay | Admitting: Family Medicine

## 2017-11-26 NOTE — Telephone Encounter (Signed)
Refill done.  

## 2017-11-30 DIAGNOSIS — G8929 Other chronic pain: Secondary | ICD-10-CM | POA: Diagnosis not present

## 2017-11-30 DIAGNOSIS — M4200 Juvenile osteochondrosis of spine, site unspecified: Secondary | ICD-10-CM | POA: Diagnosis not present

## 2017-11-30 DIAGNOSIS — M999 Biomechanical lesion, unspecified: Secondary | ICD-10-CM | POA: Diagnosis not present

## 2017-11-30 DIAGNOSIS — M546 Pain in thoracic spine: Secondary | ICD-10-CM | POA: Diagnosis not present

## 2017-12-03 DIAGNOSIS — G8929 Other chronic pain: Secondary | ICD-10-CM | POA: Diagnosis not present

## 2017-12-03 DIAGNOSIS — M4200 Juvenile osteochondrosis of spine, site unspecified: Secondary | ICD-10-CM | POA: Diagnosis not present

## 2017-12-03 DIAGNOSIS — F988 Other specified behavioral and emotional disorders with onset usually occurring in childhood and adolescence: Secondary | ICD-10-CM | POA: Diagnosis not present

## 2017-12-03 DIAGNOSIS — M999 Biomechanical lesion, unspecified: Secondary | ICD-10-CM | POA: Diagnosis not present

## 2017-12-03 DIAGNOSIS — M546 Pain in thoracic spine: Secondary | ICD-10-CM | POA: Diagnosis not present

## 2017-12-03 DIAGNOSIS — Z23 Encounter for immunization: Secondary | ICD-10-CM | POA: Diagnosis not present

## 2017-12-07 DIAGNOSIS — M546 Pain in thoracic spine: Secondary | ICD-10-CM | POA: Diagnosis not present

## 2017-12-07 DIAGNOSIS — M4200 Juvenile osteochondrosis of spine, site unspecified: Secondary | ICD-10-CM | POA: Diagnosis not present

## 2017-12-07 DIAGNOSIS — M999 Biomechanical lesion, unspecified: Secondary | ICD-10-CM | POA: Diagnosis not present

## 2017-12-07 DIAGNOSIS — G8929 Other chronic pain: Secondary | ICD-10-CM | POA: Diagnosis not present

## 2017-12-10 DIAGNOSIS — M4200 Juvenile osteochondrosis of spine, site unspecified: Secondary | ICD-10-CM | POA: Diagnosis not present

## 2017-12-10 DIAGNOSIS — M546 Pain in thoracic spine: Secondary | ICD-10-CM | POA: Diagnosis not present

## 2017-12-10 DIAGNOSIS — G8929 Other chronic pain: Secondary | ICD-10-CM | POA: Diagnosis not present

## 2017-12-10 DIAGNOSIS — M999 Biomechanical lesion, unspecified: Secondary | ICD-10-CM | POA: Diagnosis not present

## 2017-12-14 DIAGNOSIS — M999 Biomechanical lesion, unspecified: Secondary | ICD-10-CM | POA: Diagnosis not present

## 2017-12-14 DIAGNOSIS — M4200 Juvenile osteochondrosis of spine, site unspecified: Secondary | ICD-10-CM | POA: Diagnosis not present

## 2017-12-14 DIAGNOSIS — G8929 Other chronic pain: Secondary | ICD-10-CM | POA: Diagnosis not present

## 2017-12-14 DIAGNOSIS — M546 Pain in thoracic spine: Secondary | ICD-10-CM | POA: Diagnosis not present

## 2017-12-17 DIAGNOSIS — G8929 Other chronic pain: Secondary | ICD-10-CM | POA: Diagnosis not present

## 2017-12-17 DIAGNOSIS — M546 Pain in thoracic spine: Secondary | ICD-10-CM | POA: Diagnosis not present

## 2017-12-17 DIAGNOSIS — M999 Biomechanical lesion, unspecified: Secondary | ICD-10-CM | POA: Diagnosis not present

## 2017-12-17 DIAGNOSIS — M4200 Juvenile osteochondrosis of spine, site unspecified: Secondary | ICD-10-CM | POA: Diagnosis not present

## 2017-12-19 NOTE — Progress Notes (Signed)
[name] [address] [place], [place] [address] Phone: [phone] Subjective:   Frank [name], am serving as a scribe for Dr. Antoine Primas.   CC: Back pain follow-up  BJY:NWGNFAOZHY  Frank [name] is a 27 y.o. male coming in with complaint of back pain. He has been doing ok since last visit. Is still going to physical therapy which he feel is helping his pain.  Patient is still frustrated with the difficulty with weight loss.  Trying to continue to monitor his diet.  Still has some aching pain here and there but overall would state that he is somewhat better than previous exam.    Past Medical History:  Diagnosis Date  . ADD (attention deficit disorder)   . Anxiety   . Narcolepsy through school   Past Surgical History:  Procedure Laterality Date  . KNEE SURGERY    . WISDOM TOOTH EXTRACTION  2013   Social History   Socioeconomic History  . Marital status: Single    Spouse name: Not on file  . Number of children: 0  . Years of education: Not on file  . Highest education level: Bachelor's degree (e.g., BA, AB, BS)  Occupational History    Employer: [employer]  Social Needs  . Financial resource strain: Not on file  . Food insecurity:    Worry: Not on file    Inability: Not on file  . Transportation needs:    Medical: Not on file    Non-medical: Not on file  Tobacco Use  . Smoking status: Never Smoker  . Smokeless tobacco: Never Used  Substance and Sexual Activity  . Alcohol use: Yes    Alcohol/week: 12.0 standard drinks    Types: 5 Shots of liquor, 7 Standard drinks or equivalent per week    Comment: Mostly just weekends  . Drug use: No  . Sexual activity: Yes    Birth control/protection: Condom  Lifestyle  . Physical activity:    Days per week: Not on file    Minutes per session: Not on file  . Stress: Not on file  Relationships  . Social connections:    Talks on phone: Not on file    Gets together: Not on file   Attends religious service: Not on file    Active member of club or organization: Not on file    Attends meetings of clubs or organizations: Not on file    Relationship status: Not on file  Other Topics Concern  . Not on file  Social History Narrative   Caffeine 4-5 cups a day. Decreasing adderall has increased need for coffee.   Moved back in with his parents from [place], [place] in 02/2017 to address his chronic pain and disability more aggressively (leave of absence from work).   No Known Allergies Family History  Problem Relation Age of Onset  . Stroke Maternal Grandmother   . Cancer Paternal Grandmother        Breast  . Hypertension Paternal Grandmother   . Diabetes Paternal Grandfather   . Prostate cancer Father        and grandfather  . Other Neg Hx     Current Outpatient Medications (Endocrine & Metabolic):  .  predniSONE (DELTASONE) 50 MG tablet, Take 1 tablet (50 mg total) by mouth daily.   Current Outpatient Medications (Respiratory):  .  albuterol (PROVENTIL HFA;VENTOLIN HFA) 108 (90 Base) MCG/ACT inhaler, Inhale 2 puffs into the lungs every 4 (four) hours as  needed for wheezing or shortness of breath (cough, shortness of breath or wheezing.).  Current Outpatient Medications (Analgesics):  .  meloxicam (MOBIC) 15 MG tablet, Take 1 tablet (15 mg total) by mouth daily.   Current Outpatient Medications (Other):  Marland Kitchen  buPROPion (WELLBUTRIN XL) 300 MG 24 hr tablet, Take 1 tablet (300 mg total) by mouth daily. .  DULoxetine (CYMBALTA) 20 MG capsule, TAKE 1 CAPSULE BY MOUTH EVERY DAY .  traZODone (DESYREL) 50 MG tablet, Take 1-2 tablets (50-100 mg total) by mouth at bedtime as needed for sleep. .  Vitamin D, Ergocalciferol, (DRISDOL) 50000 units CAPS capsule, Take 1 capsule (50,000 Units total) by mouth every 7 (seven) days. Marland Kitchen  zolpidem (AMBIEN) 10 MG tablet, Take 1 tablet (10 mg total) by mouth at bedtime as needed. Marland Kitchen  amphetamine-dextroamphetamine (ADDERALL) 15 MG tablet, Take  1 tablet by mouth 2 (two) times daily.    Past medical history, social, surgical and family history all reviewed in electronic medical record.  No pertanent information unless stated regarding to the chief complaint.   Review of Systems:  No headache, visual changes, nausea, vomiting, diarrhea, constipation, dizziness, abdominal pain, skin rash, fevers, chills, night sweats, weight loss, swollen lymph nodes, body aches, joint swelling, chest pain, shortness of breath, mood changes.  Positive muscle aches  Objective  Blood pressure 118/88, pulse 85, height 6' (1.829 m), weight 260 lb (117.9 kg), SpO2 98 %.    General: No apparent distress alert and oriented x3 mood and affect normal, dressed appropriately.  HEENT: Pupils equal, extraocular movements intact  Respiratory: Patient's speak in full sentences and does not appear short of breath  Cardiovascular: No lower extremity edema, non tender, no erythema  Skin: Warm dry intact with no signs of infection or rash on extremities or on axial skeleton.  Abdomen: Soft nontender  Neuro: Cranial nerves II through XII are intact, neurovascularly intact in all extremities with 2+ DTRs and 2+ pulses.  Lymph: No lymphadenopathy of posterior or anterior cervical chain or axillae bilaterally.  Gait normal with good balance and coordination.  MSK: Mild tender with full range of motion and good stability and symmetric strength and tone of shoulders, elbows, wrist, hip, knee and ankles bilaterally.  Back examination showed diffuse tenderness to palpation in the paraspinal musculature in the lumbar spine more around the right thoracolumbar juncture.  Tightness with Pearlean Brownie bilaterally.  Mild tightness increase in hamstrings bilaterally  Osteopathic findings C2 flexed rotated and side bent right C6 flexed rotated and side bent left T3 extended rotated and side bent right inhaled third rib T9 extended rotated and side bent left L4 flexed rotated and side bent  left Sacrum right on right'   Impression and Recommendations:     This case required medical decision making of moderate complexity. The above documentation has been reviewed and is accurate and complete [name], DO       Note: This dictation was prepared with Dragon dictation along with smaller phrase technology. Any transcriptional errors that result from this process are unintentional.

## 2017-12-21 ENCOUNTER — Encounter: Payer: Self-pay | Admitting: Family Medicine

## 2017-12-21 ENCOUNTER — Ambulatory Visit: Payer: BLUE CROSS/BLUE SHIELD | Admitting: Family Medicine

## 2017-12-21 VITALS — BP 118/88 | HR 85 | Ht 72.0 in | Wt 260.0 lb

## 2017-12-21 DIAGNOSIS — M4200 Juvenile osteochondrosis of spine, site unspecified: Secondary | ICD-10-CM | POA: Diagnosis not present

## 2017-12-21 DIAGNOSIS — M999 Biomechanical lesion, unspecified: Secondary | ICD-10-CM

## 2017-12-21 DIAGNOSIS — Z23 Encounter for immunization: Secondary | ICD-10-CM

## 2017-12-21 MED ORDER — TETANUS-DIPHTH-ACELL PERTUSSIS 5-2.5-18.5 LF-MCG/0.5 IM SUSP
0.5000 mL | Freq: Once | INTRAMUSCULAR | Status: AC
  Administered 2017-12-21: 0.5 mL via INTRAMUSCULAR

## 2017-12-21 NOTE — Patient Instructions (Signed)
Good to see you  Ice is your friend T-DAP given today  Keep it up  Make sure no soy  See me again in 4-8 weeks

## 2017-12-21 NOTE — Assessment & Plan Note (Signed)
Continues to have pain.  Discussed HEP, weight loss, change in sitting positon. Discussed Avoiding narcotics when possible will be better long term RTC in 4-8 weeks

## 2017-12-24 DIAGNOSIS — M4200 Juvenile osteochondrosis of spine, site unspecified: Secondary | ICD-10-CM | POA: Diagnosis not present

## 2017-12-24 DIAGNOSIS — M546 Pain in thoracic spine: Secondary | ICD-10-CM | POA: Diagnosis not present

## 2017-12-24 DIAGNOSIS — M999 Biomechanical lesion, unspecified: Secondary | ICD-10-CM | POA: Diagnosis not present

## 2017-12-24 DIAGNOSIS — G8929 Other chronic pain: Secondary | ICD-10-CM | POA: Diagnosis not present

## 2017-12-26 ENCOUNTER — Encounter: Payer: Self-pay | Admitting: Family Medicine

## 2017-12-26 ENCOUNTER — Ambulatory Visit: Payer: BLUE CROSS/BLUE SHIELD | Admitting: Family Medicine

## 2017-12-26 VITALS — BP 130/74 | HR 80 | Ht 72.0 in | Wt 257.0 lb

## 2017-12-26 DIAGNOSIS — M4200 Juvenile osteochondrosis of spine, site unspecified: Secondary | ICD-10-CM | POA: Diagnosis not present

## 2017-12-26 DIAGNOSIS — M999 Biomechanical lesion, unspecified: Secondary | ICD-10-CM

## 2017-12-26 MED ORDER — TIZANIDINE HCL 4 MG PO TABS: 4.0000 mg | ORAL_TABLET | Freq: Every evening | ORAL | 2 refills | Status: DC

## 2017-12-26 NOTE — Assessment & Plan Note (Signed)
Stable overall.  Discussed icing regimen and home exercise.  Discussed which activities doing which would avoid.  Discussed posture and ergonomics.  Follow-up again in 4 to 8 weeks

## 2017-12-26 NOTE — Assessment & Plan Note (Signed)
Decision today to treat with OMT was based on Physical Exam  After verbal consent patient was treated with HVLA, ME, FPR techniques in cervical, thoracic, rib, lumbar and sacral areas  Patient tolerated the procedure well with improvement in symptoms  Patient given exercises, stretches and lifestyle modifications  See medications in patient instructions if given  Patient will follow up in 4 weeks 

## 2017-12-26 NOTE — Patient Instructions (Signed)
Good to see you  Ice is your friend Zanaflex at night for at least 3 nights See me again as scheduled.

## 2017-12-26 NOTE — Progress Notes (Signed)
[facility] D.O. [facility] [address] [place], [place] [address] Phone: [phone] Subjective:    I Ronelle NighKana Thompson am serving as a Neurosurgeonscribe for Dr. Antoine PrimasZachary Wells Gerdeman.   CC: Back pain follow-up  BJY:NWGNFAOZHYHPI:Subjective  Frank PallSamuel J [name] is a 27 y.o. male coming in with complaint of back pain. States he is doing well today.   Back pain follow-up.  Has been doing relatively well overall.  Discussed icing regimen and home exercise patient is having aching pain overall.  Patient was playing golf multiple times.  Feels that this can seem to exacerbate it.     Past Medical History:  Diagnosis Date  . ADD (attention deficit disorder)   . Anxiety   . Narcolepsy through school   Past Surgical History:  Procedure Laterality Date  . KNEE SURGERY    . WISDOM TOOTH EXTRACTION  2013   Social History   Socioeconomic History  . Marital status: Single    Spouse name: Not on file  . Number of children: 0  . Years of education: Not on file  . Highest education level: Bachelor's degree (e.g., BA, AB, BS)  Occupational History    Employer: [employer]  Social Needs  . Financial resource strain: Not on file  . Food insecurity:    Worry: Not on file    Inability: Not on file  . Transportation needs:    Medical: Not on file    Non-medical: Not on file  Tobacco Use  . Smoking status: Never Smoker  . Smokeless tobacco: Never Used  Substance and Sexual Activity  . Alcohol use: Yes    Alcohol/week: 12.0 standard drinks    Types: 5 Shots of liquor, 7 Standard drinks or equivalent per week    Comment: Mostly just weekends  . Drug use: No  . Sexual activity: Yes    Birth control/protection: Condom  Lifestyle  . Physical activity:    Days per week: Not on file    Minutes per session: Not on file  . Stress: Not on file  Relationships  . Social connections:    Talks on phone: Not on file    Gets together: Not on file    Attends religious service: Not on file    Active member of club or  organization: Not on file    Attends meetings of clubs or organizations: Not on file    Relationship status: Not on file  Other Topics Concern  . Not on file  Social History Narrative   Caffeine 4-5 cups a day. Decreasing adderall has increased need for coffee.   [place]oved [place] from [place], [place] in 02/2017 to address his chronic pain and disability more aggressively (leave of absence from work).   No Known Allergies Family History  Problem Relation Age of Onset  . Stroke Maternal Grandmother   . Cancer Paternal Grandmother        Breast  . Hypertension Paternal Grandmother   . Diabetes Paternal Grandfather   . Prostate cancer Father        and grandfather  . Other Neg Hx     Current Outpatient Medications (Endocrine & Metabolic):  .  predniSONE (DELTASONE) 50 MG tablet, Take 1 tablet (50 mg total) by mouth daily.   Current Outpatient Medications (Respiratory):  .  albuterol (PROVENTIL HFA;VENTOLIN HFA) 108 (90 Base) MCG/ACT inhaler, Inhale 2 puffs into the lungs every 4 (four) hours as needed for wheezing or shortness of breath (cough, shortness of  breath or wheezing.).  Current Outpatient Medications (Analgesics):  .  meloxicam (MOBIC) 15 MG tablet, Take 1 tablet (15 mg total) by mouth daily.   Current Outpatient Medications (Other):  Marland Kitchen  buPROPion (WELLBUTRIN XL) 300 MG 24 hr tablet, Take 1 tablet (300 mg total) by mouth daily. .  DULoxetine (CYMBALTA) 20 MG capsule, TAKE 1 CAPSULE BY MOUTH EVERY DAY .  traZODone (DESYREL) 50 MG tablet, Take 1-2 tablets (50-100 mg total) by mouth at bedtime as needed for sleep. .  Vitamin D, Ergocalciferol, (DRISDOL) 50000 units CAPS capsule, Take 1 capsule (50,000 Units total) by mouth every 7 (seven) days. Marland Kitchen  zolpidem (AMBIEN) 10 MG tablet, Take 1 tablet (10 mg total) by mouth at bedtime as needed. Marland Kitchen  amphetamine-dextroamphetamine (ADDERALL) 15 MG tablet, Take 1 tablet by mouth 2 (two) times daily. Marland Kitchen  tiZANidine (ZANAFLEX) 4  MG tablet, Take 1 tablet (4 mg total) by mouth Nightly for 10 days.    Past medical history, social, surgical and family history all reviewed in electronic medical record.  No pertanent information unless stated regarding to the chief complaint.   Review of Systems:  No headache, visual changes, nausea, vomiting, diarrhea, constipation, dizziness, abdominal pain, skin rash, fevers, chills, night sweats, weight loss, swollen lymph nodes, chest pain, shortness of breath, mood changes.  Positive muscle aches, body aches, joint swelling  Objective  Blood pressure 130/74, pulse 80, height 6' (1.829 m), weight 257 lb (116.6 kg), SpO2 98 %.     General: No apparent distress alert and oriented x3 mood and affect normal, dressed appropriately.  HEENT: Pupils equal, extraocular movements intact  Respiratory: Patient's speak in full sentences and does not appear short of breath  Cardiovascular: No lower extremity edema, non tender, no erythema  Skin: Warm dry intact with no signs of infection or rash on extremities or on axial skeleton.  Abdomen: Soft nontender  Neuro: Cranial nerves II through XII are intact, neurovascularly intact in all extremities with 2+ DTRs and 2+ pulses.  Lymph: No lymphadenopathy of posterior or anterior cervical chain or axillae bilaterally.  Gait normal with good balance and coordination.  MSK:  Non tender with full range of motion and good stability and symmetric strength and tone of shoulders, elbows, wrist, hip, knee and ankles bilaterally.  Back exam has loss of lordosis with diffuse tenderness in the paraspinal musculature of the lumbar spine.  Patient does have some limited extension secondary to pain.  Positive Pearlean Brownie.  Osteopathic findings C2 flexed rotated and side bent right C6 flexed rotated and side bent left T3 extended rotated and side bent right inhaled third rib T5 extended rotated and side bent left L2 flexed rotated and side bent right Sacrum right on  right      Impression and Recommendations:     This case required medical decision making of moderate complexity. The above documentation has been reviewed and is accurate and complete [name], DO       Note: This dictation was prepared with Dragon dictation along with smaller phrase technology. Any transcriptional errors that result from this process are unintentional.

## 2017-12-27 DIAGNOSIS — G894 Chronic pain syndrome: Secondary | ICD-10-CM | POA: Diagnosis not present

## 2017-12-27 DIAGNOSIS — E349 Endocrine disorder, unspecified: Secondary | ICD-10-CM | POA: Diagnosis not present

## 2017-12-27 DIAGNOSIS — M40204 Unspecified kyphosis, thoracic region: Secondary | ICD-10-CM | POA: Diagnosis not present

## 2017-12-27 DIAGNOSIS — G479 Sleep disorder, unspecified: Secondary | ICD-10-CM | POA: Diagnosis not present

## 2017-12-28 DIAGNOSIS — M999 Biomechanical lesion, unspecified: Secondary | ICD-10-CM | POA: Diagnosis not present

## 2017-12-28 DIAGNOSIS — M546 Pain in thoracic spine: Secondary | ICD-10-CM | POA: Diagnosis not present

## 2017-12-28 DIAGNOSIS — M4200 Juvenile osteochondrosis of spine, site unspecified: Secondary | ICD-10-CM | POA: Diagnosis not present

## 2017-12-28 DIAGNOSIS — G8929 Other chronic pain: Secondary | ICD-10-CM | POA: Diagnosis not present

## 2018-01-04 DIAGNOSIS — G8929 Other chronic pain: Secondary | ICD-10-CM | POA: Diagnosis not present

## 2018-01-04 DIAGNOSIS — M999 Biomechanical lesion, unspecified: Secondary | ICD-10-CM | POA: Diagnosis not present

## 2018-01-04 DIAGNOSIS — M546 Pain in thoracic spine: Secondary | ICD-10-CM | POA: Diagnosis not present

## 2018-01-04 DIAGNOSIS — M4200 Juvenile osteochondrosis of spine, site unspecified: Secondary | ICD-10-CM | POA: Diagnosis not present

## 2018-01-14 DIAGNOSIS — M4200 Juvenile osteochondrosis of spine, site unspecified: Secondary | ICD-10-CM | POA: Diagnosis not present

## 2018-01-14 DIAGNOSIS — M546 Pain in thoracic spine: Secondary | ICD-10-CM | POA: Diagnosis not present

## 2018-01-14 DIAGNOSIS — G8929 Other chronic pain: Secondary | ICD-10-CM | POA: Diagnosis not present

## 2018-01-14 DIAGNOSIS — M999 Biomechanical lesion, unspecified: Secondary | ICD-10-CM | POA: Diagnosis not present

## 2018-01-16 DIAGNOSIS — G894 Chronic pain syndrome: Secondary | ICD-10-CM | POA: Diagnosis not present

## 2018-01-16 DIAGNOSIS — G479 Sleep disorder, unspecified: Secondary | ICD-10-CM | POA: Diagnosis not present

## 2018-01-16 DIAGNOSIS — Z79891 Long term (current) use of opiate analgesic: Secondary | ICD-10-CM | POA: Diagnosis not present

## 2018-01-16 DIAGNOSIS — E230 Hypopituitarism: Secondary | ICD-10-CM | POA: Diagnosis not present

## 2018-01-17 NOTE — Progress Notes (Signed)
[name] [address] [place], [place] [address] Phone: [phone] Subjective:   Frank [name], am serving as a scribe for Dr. Antoine Primas.    CC: Back pain  BJY:NWGNFAOZHY  Frank [name] is a 27 y.o. male coming in with complaint of back pain. Did have back pain with his Thanksgiving trip. Patient had stiffness from traveling to the beach. Did ride his bike and played tennis.  Patient is been doing relatively well.  Some mild soreness recently but nothing severe.  Has had no significant exacerbation.  Did try a half dose of that muscle relaxer during the day which he did find helpful.      Past Medical History:  Diagnosis Date  . ADD (attention deficit disorder)   . Anxiety   . Narcolepsy through school   Past Surgical History:  Procedure Laterality Date  . KNEE SURGERY    . WISDOM TOOTH EXTRACTION  2013   Social History   Socioeconomic History  . Marital status: Single    Spouse name: Not on file  . Number of children: 0  . Years of education: Not on file  . Highest education level: Bachelor's degree (e.g., BA, AB, BS)  Occupational History    Employer: [employer]  Social Needs  . Financial resource strain: Not on file  . Food insecurity:    Worry: Not on file    Inability: Not on file  . Transportation needs:    Medical: Not on file    Non-medical: Not on file  Tobacco Use  . Smoking status: Never Smoker  . Smokeless tobacco: Never Used  Substance and Sexual Activity  . Alcohol use: Yes    Alcohol/week: 12.0 standard drinks    Types: 5 Shots of liquor, 7 Standard drinks or equivalent per week    Comment: Mostly just weekends  . Drug use: No  . Sexual activity: Yes    Birth control/protection: Condom  Lifestyle  . Physical activity:    Days per week: Not on file    Minutes per session: Not on file  . Stress: Not on file  Relationships  . Social connections:    Talks on phone: Not on file    Gets together: Not on  file    Attends religious service: Not on file    Active member of club or organization: Not on file    Attends meetings of clubs or organizations: Not on file    Relationship status: Not on file  Other Topics Concern  . Not on file  Social History Narrative   Caffeine 4-5 cups a day. Decreasing adderall has increased need for coffee.   Moved back in with his parents from [place], [place] in 02/2017 to address his chronic pain and disability more aggressively (leave of absence from work).   No Known Allergies Family History  Problem Relation Age of Onset  . Stroke Maternal Grandmother   . Cancer Paternal Grandmother        Breast  . Hypertension Paternal Grandmother   . Diabetes Paternal Grandfather   . Prostate cancer Father        and grandfather  . Other Neg Hx     Current Outpatient Medications (Endocrine & Metabolic):  .  predniSONE (DELTASONE) 50 MG tablet, Take 1 tablet (50 mg total) by mouth daily.   Current Outpatient Medications (Respiratory):  .  albuterol (PROVENTIL HFA;VENTOLIN HFA) 108 (90 Base) MCG/ACT inhaler, Inhale 2 puffs into the lungs  every 4 (four) hours as needed for wheezing or shortness of breath (cough, shortness of breath or wheezing.).  Current Outpatient Medications (Analgesics):  .  meloxicam (MOBIC) 15 MG tablet, Take 1 tablet (15 mg total) by mouth daily.   Current Outpatient Medications (Other):  Marland Kitchen.  buPROPion (WELLBUTRIN XL) 300 MG 24 hr tablet, Take 1 tablet (300 mg total) by mouth daily. .  DULoxetine (CYMBALTA) 20 MG capsule, TAKE 1 CAPSULE BY MOUTH EVERY DAY .  traZODone (DESYREL) 50 MG tablet, Take 1-2 tablets (50-100 mg total) by mouth at bedtime as needed for sleep. .  Vitamin D, Ergocalciferol, (DRISDOL) 50000 units CAPS capsule, Take 1 capsule (50,000 Units total) by mouth every 7 (seven) days. Marland Kitchen.  zolpidem (AMBIEN) 10 MG tablet, Take 1 tablet (10 mg total) by mouth at bedtime as needed. Marland Kitchen.  amphetamine-dextroamphetamine (ADDERALL) 15 MG  tablet, Take 1 tablet by mouth 2 (two) times daily.    Past medical history, social, surgical and family history all reviewed in electronic medical record.  No pertanent information unless stated regarding to the chief complaint.   Review of Systems:  No headache, visual changes, nausea, vomiting, diarrhea, constipation, dizziness, abdominal pain, skin [name], fevers, chills, night sweats, weight loss, swollen lymph nodes, body aches, joint swelling,  chest pain, shortness of breath, mood changes.  Positive muscle aches  Objective  Blood pressure 122/80, pulse 85, height 6' (1.829 m), weight 257 lb (116.6 kg), SpO2 97 %.   General: No apparent distress alert and oriented x3 mood and affect normal, dressed appropriately.  HEENT: Pupils equal, extraocular movements intact  Respiratory: Patient's speak in full sentences and does not appear short of breath  Cardiovascular: No lower extremity edema, non tender, no erythema  Skin: Warm dry intact with no signs of infection or [name] on extremities or on axial skeleton.  Abdomen: Soft nontender  Neuro: Cranial nerves II through XII are intact, neurovascularly intact in all extremities with 2+ DTRs and 2+ pulses.  Lymph: No lymphadenopathy of posterior or anterior cervical chain or axillae bilaterally.  Gait normal with good balance and coordination.  MSK:  Non tender with full range of motion and good stability and symmetric strength and tone of shoulders, elbows, wrist, hip, knee and ankles bilaterally.  Back Exam:  Inspection: Loss of lordosis Motion: Flexion 45 deg, Extension 25 deg, Side Bending to 45 deg bilaterally,  Rotation to 45 deg bilaterally  SLR laying: Negative  XSLR laying: Negative  Palpable tenderness: Tender to palpation of the paraspinal musculature lumbar spine right greater than left. FABER: Tightness bilaterally. Sensory change: Gross sensation intact to all lumbar and sacral dermatomes.  Reflexes: 2+ at both patellar  tendons, 2+ at achilles tendons, Babinski's downgoing.  Strength at foot  Plantar-flexion: 5/5 Dorsi-flexion: 5/5 Eversion: 5/5 Inversion: 5/5  Leg strength  Quad: 5/5 Hamstring: 5/5 Hip flexor: 5/5 Hip abductors: 5/5  Gait unremarkable.   Osteopathic findings  C2 flexed rotated and side bent right C6 flexed rotated and side bent left T3 extended rotated and side bent right inhaled third rib T7 extended rotated and side bent left L2 flexed rotated and side bent right Sacrum right on right     Impression and Recommendations:     This case required medical decision making of moderate complexity. The above documentation has been reviewed and is accurate and complete Frank SaaZachary M Jovanni Rash, DO       Note: This dictation was prepared with Dragon dictation along with smaller phrase technology. Any transcriptional  errors that result from this process are unintentional.

## 2018-01-18 ENCOUNTER — Encounter: Payer: Self-pay | Admitting: Family Medicine

## 2018-01-18 ENCOUNTER — Ambulatory Visit: Payer: BLUE CROSS/BLUE SHIELD | Admitting: Family Medicine

## 2018-01-18 VITALS — BP 122/80 | HR 85 | Ht 72.0 in | Wt 257.0 lb

## 2018-01-18 DIAGNOSIS — G8929 Other chronic pain: Secondary | ICD-10-CM | POA: Diagnosis not present

## 2018-01-18 DIAGNOSIS — M9981 Other biomechanical lesions of cervical region: Secondary | ICD-10-CM

## 2018-01-18 DIAGNOSIS — M999 Biomechanical lesion, unspecified: Secondary | ICD-10-CM

## 2018-01-18 DIAGNOSIS — M4200 Juvenile osteochondrosis of spine, site unspecified: Secondary | ICD-10-CM | POA: Diagnosis not present

## 2018-01-18 DIAGNOSIS — M546 Pain in thoracic spine: Secondary | ICD-10-CM | POA: Diagnosis not present

## 2018-01-18 NOTE — Patient Instructions (Signed)
Good to see you  Ice is your friend Stay active Happy holidays!  Try 1/2 tab of the tanazidine up to 2 times a day and write me if it helps See me again in 4 weeks

## 2018-01-18 NOTE — Assessment & Plan Note (Signed)
Continues to have some discomfort.  Discussed increasing the muscle relaxer on a more regular basis.  Discussed the importance of staying active and weight loss again.  Patient will follow-up again in 4 weeks

## 2018-01-18 NOTE — Assessment & Plan Note (Signed)
Decision today to treat with OMT was based on Physical Exam  After verbal consent patient was treated with HVLA, ME, FPR techniques in cervical, thoracic, rib, lumbar and sacral areas  Patient tolerated the procedure well with improvement in symptoms  Patient given exercises, stretches and lifestyle modifications  See medications in patient instructions if given  Patient will follow up in 4-6 weeks 

## 2018-01-21 DIAGNOSIS — M4200 Juvenile osteochondrosis of spine, site unspecified: Secondary | ICD-10-CM | POA: Diagnosis not present

## 2018-01-21 DIAGNOSIS — M546 Pain in thoracic spine: Secondary | ICD-10-CM | POA: Diagnosis not present

## 2018-01-21 DIAGNOSIS — M999 Biomechanical lesion, unspecified: Secondary | ICD-10-CM | POA: Diagnosis not present

## 2018-01-21 DIAGNOSIS — G8929 Other chronic pain: Secondary | ICD-10-CM | POA: Diagnosis not present

## 2018-01-25 DIAGNOSIS — M4200 Juvenile osteochondrosis of spine, site unspecified: Secondary | ICD-10-CM | POA: Diagnosis not present

## 2018-01-25 DIAGNOSIS — G8929 Other chronic pain: Secondary | ICD-10-CM | POA: Diagnosis not present

## 2018-01-25 DIAGNOSIS — M546 Pain in thoracic spine: Secondary | ICD-10-CM | POA: Diagnosis not present

## 2018-01-25 DIAGNOSIS — M999 Biomechanical lesion, unspecified: Secondary | ICD-10-CM | POA: Diagnosis not present

## 2018-01-28 DIAGNOSIS — E349 Endocrine disorder, unspecified: Secondary | ICD-10-CM | POA: Diagnosis not present

## 2018-01-28 DIAGNOSIS — M999 Biomechanical lesion, unspecified: Secondary | ICD-10-CM | POA: Diagnosis not present

## 2018-01-28 DIAGNOSIS — G8929 Other chronic pain: Secondary | ICD-10-CM | POA: Diagnosis not present

## 2018-01-28 DIAGNOSIS — M546 Pain in thoracic spine: Secondary | ICD-10-CM | POA: Diagnosis not present

## 2018-01-28 DIAGNOSIS — M4200 Juvenile osteochondrosis of spine, site unspecified: Secondary | ICD-10-CM | POA: Diagnosis not present

## 2018-02-01 ENCOUNTER — Encounter: Payer: Self-pay | Admitting: Family Medicine

## 2018-02-01 DIAGNOSIS — G8929 Other chronic pain: Secondary | ICD-10-CM | POA: Diagnosis not present

## 2018-02-01 DIAGNOSIS — M999 Biomechanical lesion, unspecified: Secondary | ICD-10-CM | POA: Diagnosis not present

## 2018-02-01 DIAGNOSIS — M546 Pain in thoracic spine: Secondary | ICD-10-CM | POA: Diagnosis not present

## 2018-02-01 DIAGNOSIS — M4200 Juvenile osteochondrosis of spine, site unspecified: Secondary | ICD-10-CM | POA: Diagnosis not present

## 2018-02-01 MED ORDER — TIZANIDINE HCL 4 MG PO TABS: 4.0000 mg | ORAL_TABLET | Freq: Every evening | ORAL | 1 refills | Status: DC

## 2018-02-04 DIAGNOSIS — M546 Pain in thoracic spine: Secondary | ICD-10-CM | POA: Diagnosis not present

## 2018-02-04 DIAGNOSIS — G8929 Other chronic pain: Secondary | ICD-10-CM | POA: Diagnosis not present

## 2018-02-04 DIAGNOSIS — M4200 Juvenile osteochondrosis of spine, site unspecified: Secondary | ICD-10-CM | POA: Diagnosis not present

## 2018-02-04 DIAGNOSIS — M999 Biomechanical lesion, unspecified: Secondary | ICD-10-CM | POA: Diagnosis not present

## 2018-02-08 DIAGNOSIS — M999 Biomechanical lesion, unspecified: Secondary | ICD-10-CM | POA: Diagnosis not present

## 2018-02-08 DIAGNOSIS — M546 Pain in thoracic spine: Secondary | ICD-10-CM | POA: Diagnosis not present

## 2018-02-08 DIAGNOSIS — M4200 Juvenile osteochondrosis of spine, site unspecified: Secondary | ICD-10-CM | POA: Diagnosis not present

## 2018-02-08 DIAGNOSIS — G8929 Other chronic pain: Secondary | ICD-10-CM | POA: Diagnosis not present

## 2018-02-11 DIAGNOSIS — M546 Pain in thoracic spine: Secondary | ICD-10-CM | POA: Diagnosis not present

## 2018-02-11 DIAGNOSIS — G8929 Other chronic pain: Secondary | ICD-10-CM | POA: Diagnosis not present

## 2018-02-11 DIAGNOSIS — M4200 Juvenile osteochondrosis of spine, site unspecified: Secondary | ICD-10-CM | POA: Diagnosis not present

## 2018-02-11 DIAGNOSIS — M999 Biomechanical lesion, unspecified: Secondary | ICD-10-CM | POA: Diagnosis not present

## 2018-02-15 DIAGNOSIS — M4200 Juvenile osteochondrosis of spine, site unspecified: Secondary | ICD-10-CM | POA: Diagnosis not present

## 2018-02-15 DIAGNOSIS — M546 Pain in thoracic spine: Secondary | ICD-10-CM | POA: Diagnosis not present

## 2018-02-15 DIAGNOSIS — G894 Chronic pain syndrome: Secondary | ICD-10-CM | POA: Diagnosis not present

## 2018-02-15 DIAGNOSIS — G8929 Other chronic pain: Secondary | ICD-10-CM | POA: Diagnosis not present

## 2018-02-15 DIAGNOSIS — M999 Biomechanical lesion, unspecified: Secondary | ICD-10-CM | POA: Diagnosis not present

## 2018-02-15 DIAGNOSIS — E230 Hypopituitarism: Secondary | ICD-10-CM | POA: Diagnosis not present

## 2018-02-15 DIAGNOSIS — G4700 Insomnia, unspecified: Secondary | ICD-10-CM | POA: Diagnosis not present

## 2018-02-18 DIAGNOSIS — M999 Biomechanical lesion, unspecified: Secondary | ICD-10-CM | POA: Diagnosis not present

## 2018-02-18 DIAGNOSIS — M4200 Juvenile osteochondrosis of spine, site unspecified: Secondary | ICD-10-CM | POA: Diagnosis not present

## 2018-02-18 DIAGNOSIS — G8929 Other chronic pain: Secondary | ICD-10-CM | POA: Diagnosis not present

## 2018-02-18 DIAGNOSIS — M546 Pain in thoracic spine: Secondary | ICD-10-CM | POA: Diagnosis not present

## 2018-02-19 NOTE — Progress Notes (Signed)
[name] [address] [place], [place] [address] Phone: [phone] Subjective:   [name], am serving as a scribe for Dr. Antoine Primas.   CC: Back pain follow-up  YDX:[id number]  Frank [name] is a 28 y.o. male coming in with complaint of back pain. Has been working out. Left hip is bothering him during workouts. Has been getting dry needling for tightness. Also has shooting nerve pain that starts in SI joint. Can get radiating symptoms when sitting on left side. Left thoracic and cervical pain and tightness as well.      Past Medical History:  Diagnosis Date  . ADD (attention deficit disorder)   . Anxiety   . Narcolepsy through school   Past Surgical History:  Procedure Laterality Date  . KNEE SURGERY    . WISDOM TOOTH EXTRACTION  2013   Social History   Socioeconomic History  . Marital status: Single    Spouse name: Not on file  . Number of children: 0  . Years of education: Not on file  . Highest education level: Bachelor's degree (e.g., BA, AB, BS)  Occupational History    Employer: [employer]  Social Needs  . Financial resource strain: Not on file  . Food insecurity:    Worry: Not on file    Inability: Not on file  . Transportation needs:    Medical: Not on file    Non-medical: Not on file  Tobacco Use  . Smoking status: Never Smoker  . Smokeless tobacco: Never Used  Substance and Sexual Activity  . Alcohol use: Yes    Alcohol/week: 12.0 standard drinks    Types: 5 Shots of liquor, 7 Standard drinks or equivalent per week    Comment: Mostly just weekends  . Drug use: No  . Sexual activity: Yes    Birth control/protection: Condom  Lifestyle  . Physical activity:    Days per week: Not on file    Minutes per session: Not on file  . Stress: Not on file  Relationships  . Social connections:    Talks on phone: Not on file    Gets together: Not on file    Attends religious service: Not on file    Active member of  club or organization: Not on file    Attends meetings of clubs or organizations: Not on file    Relationship status: Not on file  Other Topics Concern  . Not on file  Social History Narrative   Caffeine 4-5 cups a day. Decreasing adderall has increased need for coffee.   Moved back in with his parents from [place], [place] in 02/2017 to address his chronic pain and disability more aggressively (leave of absence from work).   No Known Allergies Family History  Problem Relation Age of Onset  . Stroke Maternal Grandmother   . Cancer Paternal Grandmother        Breast  . Hypertension Paternal Grandmother   . Diabetes Paternal Grandfather   . Prostate cancer Father        and grandfather  . Other Neg Hx     Current Outpatient Medications (Endocrine & Metabolic):  .  predniSONE (DELTASONE) 50 MG tablet, Take 1 tablet (50 mg total) by mouth daily.   Current Outpatient Medications (Respiratory):  .  albuterol (PROVENTIL HFA;VENTOLIN HFA) 108 (90 Base) MCG/ACT inhaler, Inhale 2 puffs into the lungs every 4 (four) hours as needed for wheezing or shortness of breath (cough, shortness of breath  or wheezing.).  Current Outpatient Medications (Analgesics):  .  meloxicam (MOBIC) 15 MG tablet, Take 1 tablet (15 mg total) by mouth daily.   Current Outpatient Medications (Other):  Marland Kitchen  buPROPion (WELLBUTRIN XL) 300 MG 24 hr tablet, Take 1 tablet (300 mg total) by mouth daily. .  DULoxetine (CYMBALTA) 20 MG capsule, TAKE 1 CAPSULE BY MOUTH EVERY DAY .  tiZANidine (ZANAFLEX) 4 MG tablet, Take 1 tablet (4 mg total) by mouth Nightly. .  traZODone (DESYREL) 50 MG tablet, Take 1-2 tablets (50-100 mg total) by mouth at bedtime as needed for sleep. .  Vitamin D, Ergocalciferol, (DRISDOL) 50000 units CAPS capsule, Take 1 capsule (50,000 Units total) by mouth every 7 (seven) days. Marland Kitchen  zolpidem (AMBIEN) 10 MG tablet, Take 1 tablet (10 mg total) by mouth at bedtime as needed. Marland Kitchen  amphetamine-dextroamphetamine  (ADDERALL) 15 MG tablet, Take 1 tablet by mouth 2 (two) times daily.    Past medical history, social, surgical and family history all reviewed in electronic medical record.  No pertanent information unless stated regarding to the chief complaint.   Review of Systems:  No headache, visual changes, nausea, vomiting, diarrhea, constipation, dizziness, abdominal pain, skin rash, fevers, chills, night sweats, weight loss, swollen lymph nodes, body aches, joint swelling,  chest pain, shortness of breath, mood changes.  Positive muscle aches  Objective  Blood pressure (!) 112/58, pulse 91, height 6' (1.829 m), weight 260 lb (117.9 kg), SpO2 97 %.    General: No apparent distress alert and oriented x3 mood and affect normal, dressed appropriately.  HEENT: Pupils equal, extraocular movements intact  Respiratory: Patient's speak in full sentences and does not appear short of breath  Cardiovascular: No lower extremity edema, non tender, no erythema  Skin: Warm dry intact with no signs of infection or rash on extremities or on axial skeleton.  Abdomen: Soft nontender  Neuro: Cranial nerves II through XII are intact, neurovascularly intact in all extremities with 2+ DTRs and 2+ pulses.  Lymph: No lymphadenopathy of posterior or anterior cervical chain or axillae bilaterally.  Gait normal with good balance and coordination.  MSK:  Non tender with full range of motion and good stability and symmetric strength and tone of shoulders, elbows, wrist, hip, knee and ankles bilaterally.  Back Exam:  Inspection: Loss of lordosis Motion: Flexion 45 deg, Extension 25 deg, Side Bending to 30 deg bilaterally, Rotation to 25 deg bilaterally  SLR laying: Negative  XSLR laying: Negative  Palpable tenderness: Tender to palpation more in the thoracolumbar juncture right side, as well as the left sacroiliac joint.  This is different than some of his previous exams.. FABER: negative. Sensory change: Gross sensation  intact to all lumbar and sacral dermatomes.  Reflexes: 2+ at both patellar tendons, 2+ at achilles tendons, Babinski's downgoing.  Strength at foot  Plantar-flexion: 5/5 Dorsi-flexion: 5/5 Eversion: 5/5 Inversion: 5/5  Leg strength  Quad: 5/5 Hamstring: 5/5 Hip flexor: 5/5 Hip abductors: 4/5   Osteopathic findings C2 flexed rotated and side bent right C4 flexed rotated and side bent left C6 flexed rotated and side bent left T7 extended rotated and side bent right inhaled rib L2 flexed rotated and side bent right Sacrum left on left    Impression and Recommendations:     This case required medical decision making of moderate complexity. The above documentation has been reviewed and is accurate and complete [name], DO       Note: This dictation was prepared with Reubin Milan  dictation along with smaller phrase technology. Any transcriptional errors that result from this process are unintentional.

## 2018-02-20 ENCOUNTER — Ambulatory Visit: Payer: BLUE CROSS/BLUE SHIELD | Admitting: Family Medicine

## 2018-02-20 ENCOUNTER — Encounter: Payer: Self-pay | Admitting: Family Medicine

## 2018-02-20 VITALS — BP 112/58 | HR 91 | Ht 72.0 in | Wt 260.0 lb

## 2018-02-20 DIAGNOSIS — M999 Biomechanical lesion, unspecified: Secondary | ICD-10-CM | POA: Diagnosis not present

## 2018-02-20 DIAGNOSIS — M4200 Juvenile osteochondrosis of spine, site unspecified: Secondary | ICD-10-CM | POA: Diagnosis not present

## 2018-02-20 NOTE — Assessment & Plan Note (Signed)
Decision today to treat with OMT was based on Physical Exam  After verbal consent patient was treated with HVLA, ME, FPR techniques in cervical, thoracic, rib,  lumbar and sacral areas  Patient tolerated the procedure well with improvement in symptoms  Patient given exercises, stretches and lifestyle modifications  See medications in patient instructions if given  Patient will follow up in 4-8 weeks 

## 2018-02-20 NOTE — Assessment & Plan Note (Signed)
Patient seems to be doing a little bit better overall.  Discussed with patient again at great length.  Discussed icing regimen and home exercises, discussed which activities to do which was to avoid.  Patient is to increase activity slowly over the course the next several days.  Follow-up with me again in 4 to 8 weeks.

## 2018-02-20 NOTE — Patient Instructions (Signed)
Good to see you  You are doing great  Keep it up  Still work on the scapula and then core stability  See me again in 4-5 weeks

## 2018-02-22 DIAGNOSIS — G8929 Other chronic pain: Secondary | ICD-10-CM | POA: Diagnosis not present

## 2018-02-22 DIAGNOSIS — M999 Biomechanical lesion, unspecified: Secondary | ICD-10-CM | POA: Diagnosis not present

## 2018-02-22 DIAGNOSIS — M4200 Juvenile osteochondrosis of spine, site unspecified: Secondary | ICD-10-CM | POA: Diagnosis not present

## 2018-02-22 DIAGNOSIS — M546 Pain in thoracic spine: Secondary | ICD-10-CM | POA: Diagnosis not present

## 2018-02-25 DIAGNOSIS — M999 Biomechanical lesion, unspecified: Secondary | ICD-10-CM | POA: Diagnosis not present

## 2018-02-25 DIAGNOSIS — G8929 Other chronic pain: Secondary | ICD-10-CM | POA: Diagnosis not present

## 2018-02-25 DIAGNOSIS — M546 Pain in thoracic spine: Secondary | ICD-10-CM | POA: Diagnosis not present

## 2018-02-25 DIAGNOSIS — M4200 Juvenile osteochondrosis of spine, site unspecified: Secondary | ICD-10-CM | POA: Diagnosis not present

## 2018-03-01 DIAGNOSIS — G8929 Other chronic pain: Secondary | ICD-10-CM | POA: Diagnosis not present

## 2018-03-01 DIAGNOSIS — M4200 Juvenile osteochondrosis of spine, site unspecified: Secondary | ICD-10-CM | POA: Diagnosis not present

## 2018-03-01 DIAGNOSIS — M546 Pain in thoracic spine: Secondary | ICD-10-CM | POA: Diagnosis not present

## 2018-03-01 DIAGNOSIS — M999 Biomechanical lesion, unspecified: Secondary | ICD-10-CM | POA: Diagnosis not present

## 2018-03-04 DIAGNOSIS — M4200 Juvenile osteochondrosis of spine, site unspecified: Secondary | ICD-10-CM | POA: Diagnosis not present

## 2018-03-04 DIAGNOSIS — M546 Pain in thoracic spine: Secondary | ICD-10-CM | POA: Diagnosis not present

## 2018-03-04 DIAGNOSIS — M999 Biomechanical lesion, unspecified: Secondary | ICD-10-CM | POA: Diagnosis not present

## 2018-03-04 DIAGNOSIS — G8929 Other chronic pain: Secondary | ICD-10-CM | POA: Diagnosis not present

## 2018-03-08 DIAGNOSIS — M546 Pain in thoracic spine: Secondary | ICD-10-CM | POA: Diagnosis not present

## 2018-03-08 DIAGNOSIS — G8929 Other chronic pain: Secondary | ICD-10-CM | POA: Diagnosis not present

## 2018-03-08 DIAGNOSIS — M999 Biomechanical lesion, unspecified: Secondary | ICD-10-CM | POA: Diagnosis not present

## 2018-03-08 DIAGNOSIS — M4200 Juvenile osteochondrosis of spine, site unspecified: Secondary | ICD-10-CM | POA: Diagnosis not present

## 2018-03-11 DIAGNOSIS — M999 Biomechanical lesion, unspecified: Secondary | ICD-10-CM | POA: Diagnosis not present

## 2018-03-11 DIAGNOSIS — G8929 Other chronic pain: Secondary | ICD-10-CM | POA: Diagnosis not present

## 2018-03-11 DIAGNOSIS — M4200 Juvenile osteochondrosis of spine, site unspecified: Secondary | ICD-10-CM | POA: Diagnosis not present

## 2018-03-11 DIAGNOSIS — M546 Pain in thoracic spine: Secondary | ICD-10-CM | POA: Diagnosis not present

## 2018-03-15 DIAGNOSIS — M546 Pain in thoracic spine: Secondary | ICD-10-CM | POA: Diagnosis not present

## 2018-03-15 DIAGNOSIS — G8929 Other chronic pain: Secondary | ICD-10-CM | POA: Diagnosis not present

## 2018-03-15 DIAGNOSIS — M4200 Juvenile osteochondrosis of spine, site unspecified: Secondary | ICD-10-CM | POA: Diagnosis not present

## 2018-03-15 DIAGNOSIS — M999 Biomechanical lesion, unspecified: Secondary | ICD-10-CM | POA: Diagnosis not present

## 2018-03-18 DIAGNOSIS — M999 Biomechanical lesion, unspecified: Secondary | ICD-10-CM | POA: Diagnosis not present

## 2018-03-18 DIAGNOSIS — M546 Pain in thoracic spine: Secondary | ICD-10-CM | POA: Diagnosis not present

## 2018-03-18 DIAGNOSIS — G8929 Other chronic pain: Secondary | ICD-10-CM | POA: Diagnosis not present

## 2018-03-18 DIAGNOSIS — M4200 Juvenile osteochondrosis of spine, site unspecified: Secondary | ICD-10-CM | POA: Diagnosis not present

## 2018-03-18 NOTE — Progress Notes (Signed)
[facility] Rhodesia [name] D.O. [facility] [address] [place], [place] [address] Phone: [phone] Subjective:   Frank [name], Frank [name], am serving as a scribe for Dr. Antoine PrimasZachary Adriaan [name].   CC: neck and back pain   OZH:[id number]:Subjective  Frank PallSamuel J [name] is a 28 y.o. male coming in with complaint of thoracic spine and cervical spine. Was golfing and had a workout session yesterday with a Systems analystpersonal trainer. Is having spasming in the mid-back now. Unable to rotate cervical spine in either direction. Denies any radicular symptoms.    Past Medical History:  Diagnosis Date  . ADD (attention deficit disorder)   . Anxiety   . Narcolepsy through school   Past Surgical History:  Procedure Laterality Date  . KNEE SURGERY    . WISDOM TOOTH EXTRACTION  2013   Social History   Socioeconomic History  . Marital status: Single    Spouse name: Not on file  . Number of children: 0  . Years of education: Not on file  . Highest education level: Bachelor's degree (e.g., BA, AB, BS)  Occupational History    Employer: [employer]  Social Needs  . Financial resource strain: Not on file  . Food insecurity:    Worry: Not on file    Inability: Not on file  . Transportation needs:    Medical: Not on file    Non-medical: Not on file  Tobacco Use  . Smoking status: Never Smoker  . Smokeless tobacco: Never Used  Substance and Sexual Activity  . Alcohol use: Yes    Alcohol/week: 12.0 standard drinks    Types: 5 Shots of liquor, 7 Standard drinks or equivalent per week    Comment: Mostly just weekends  . Drug use: No  . Sexual activity: Yes    Birth control/protection: Condom  Lifestyle  . Physical activity:    Days per week: Not on file    Minutes per session: Not on file  . Stress: Not on file  Relationships  . Social connections:    Talks on phone: Not on file    Gets together: Not on file    Attends religious service: Not on file    Active member of club or organization: Not on file    Attends meetings  of clubs or organizations: Not on file    Relationship status: Not on file  Other Topics Concern  . Not on file  Social History Narrative   Caffeine 4-5 cups a day. Decreasing adderall has increased need for coffee.   [place] in 02[place]2017 to address his chronic pain and disability more aggressively (leave of absence from work).   No Known Allergies Family History  Problem Relation Age of Onset  . Stroke Maternal Grandmother   . Cancer Paternal Grandmother        Breast  . Hypertension Paternal Grandmother   . Diabetes Paternal Grandfather   . Prostate cancer Father        and grandfather  . Other Neg Hx     Current Outpatient Medications (Endocrine & Metabolic):  .  predniSONE (DELTASONE) 50 MG tablet, Take 1 tablet (50 mg total) by mouth daily.   Current Outpatient Medications (Respiratory):  .  albuterol (PROVENTIL HFA;VENTOLIN HFA) 108 (90 Base) MCG/ACT inhaler, Inhale 2 puffs into the lungs every 4 (four) hours as needed for wheezing or shortness of breath (cough, shortness of breath or wheezing.).  Current Outpatient Medications (Analgesics):  .  meloxicam (MOBIC)  15 MG tablet, Take 1 tablet (15 mg total) by mouth daily.   Current Outpatient Medications (Other):  Marland Kitchen.  buPROPion (WELLBUTRIN XL) 300 MG 24 hr tablet, Take 1 tablet (300 mg total) by mouth daily. .  DULoxetine (CYMBALTA) 20 MG capsule, TAKE 1 CAPSULE BY MOUTH EVERY DAY .  tiZANidine (ZANAFLEX) 4 MG tablet, Take 1 tablet (4 mg total) by mouth Nightly. .  traZODone (DESYREL) 50 MG tablet, Take 1-2 tablets (50-100 mg total) by mouth at bedtime as needed for sleep. .  Vitamin D, Ergocalciferol, (DRISDOL) 50000 units CAPS capsule, Take 1 capsule (50,000 Units total) by mouth every 7 (seven) days. Marland Kitchen.  zolpidem (AMBIEN) 10 MG tablet, Take 1 tablet (10 mg total) by mouth at bedtime as needed. Marland Kitchen.  amphetamine-dextroamphetamine (ADDERALL) 15 MG tablet, Take 1 tablet by mouth 2 (two) times  daily.    Past medical history, social, surgical and family history all reviewed in electronic medical record.  No pertanent information unless stated regarding to the chief complaint.   Review of Systems:  No headache, visual changes, nausea, vomiting, diarrhea, constipation, dizziness, abdominal pain, skin rash, fevers, chills, night sweats, weight loss, swollen lymph nodes,  chest pain, shortness of breath, mood changes.  Positive muscle aches and body aches  Objective  Blood pressure 132/82, pulse 86, height 6' (1.829 m), weight 256 lb (116.1 kg), SpO2 97 %.   General: No apparent distress alert and oriented x3 mood and affect normal, dressed appropriately.  HEENT: Pupils equal, extraocular movements intact  Respiratory: Patient's speak in full sentences and does not appear short of breath  Cardiovascular: No lower extremity edema, non tender, no erythema  Skin: Warm dry intact with no signs of infection or rash on extremities or on axial skeleton.  Abdomen: Soft nontender  Neuro: Cranial nerves II through XII are intact, neurovascularly intact in all extremities with 2+ DTRs and 2+ pulses.  Lymph: No lymphadenopathy of posterior or anterior cervical chain or axillae bilaterally.  Gait normal with good balance and coordination.  MSK:  Non tender with full range of motion and good stability and symmetric strength and tone of shoulders, elbows, wrist, hip, knee and ankles bilaterally.  Back Exam:  Inspection: Loss of lordosis Motion: Flexion 45 deg, Extension 20 deg, Side Bending to 35 deg bilaterally,  Rotation to 35 deg bilaterally  SLR laying: Negative  XSLR laying: Negative  Palpable tenderness: To palpation the paraspinal musculature more in the thoracolumbar left side and sacrum contralateral side. FABER: Positive Faber on the right. Sensory change: Gross sensation intact to all lumbar and sacral dermatomes.  Reflexes: 2+ at both patellar tendons, 2+ at achilles tendons,  Babinski's downgoing.  Strength at foot  Plantar-flexion: 5/5 Dorsi-flexion: 5/5 Eversion: 5/5 Inversion: 5/5  Leg strength  Quad: 5/5 Hamstring: 5/5 Hip flexor: 5/5 Hip abductors: 5/5  Gait unremarkable. Neck exam has loss of lordosis.  Negative Spurling's.  Mild limited range of motion in all planes by 5 degrees  Osteopathic findings C2 flexed rotated and side bent right C6 flexed rotated and side bent left T3 extended rotated and side bent right inhaled third rib T7 extended rotated and side bent left L2 flexed rotated and side bent right Sacrum right on right    Impression and Recommendations:     This case required medical decision making of moderate complexity. The above documentation has been reviewed and is accurate and complete Frank SaaZachary M Metztli Sachdev, DO       Note: This dictation was prepared  with Dragon dictation along with smaller phrase technology. Any transcriptional errors that result from this process are unintentional.

## 2018-03-19 DIAGNOSIS — E230 Hypopituitarism: Secondary | ICD-10-CM | POA: Diagnosis not present

## 2018-03-19 DIAGNOSIS — G4700 Insomnia, unspecified: Secondary | ICD-10-CM | POA: Diagnosis not present

## 2018-03-19 DIAGNOSIS — E349 Endocrine disorder, unspecified: Secondary | ICD-10-CM | POA: Diagnosis not present

## 2018-03-19 DIAGNOSIS — G894 Chronic pain syndrome: Secondary | ICD-10-CM | POA: Diagnosis not present

## 2018-03-19 DIAGNOSIS — Z79891 Long term (current) use of opiate analgesic: Secondary | ICD-10-CM | POA: Diagnosis not present

## 2018-03-19 DIAGNOSIS — F988 Other specified behavioral and emotional disorders with onset usually occurring in childhood and adolescence: Secondary | ICD-10-CM | POA: Diagnosis not present

## 2018-03-20 ENCOUNTER — Encounter: Payer: Self-pay | Admitting: Family Medicine

## 2018-03-20 ENCOUNTER — Ambulatory Visit: Payer: BLUE CROSS/BLUE SHIELD | Admitting: Family Medicine

## 2018-03-20 VITALS — BP 132/82 | HR 86 | Ht 72.0 in | Wt 256.0 lb

## 2018-03-20 DIAGNOSIS — M4200 Juvenile osteochondrosis of spine, site unspecified: Secondary | ICD-10-CM | POA: Diagnosis not present

## 2018-03-20 DIAGNOSIS — M999 Biomechanical lesion, unspecified: Secondary | ICD-10-CM

## 2018-03-20 DIAGNOSIS — M9981 Other biomechanical lesions of cervical region: Secondary | ICD-10-CM | POA: Diagnosis not present

## 2018-03-20 NOTE — Patient Instructions (Signed)
Good to see you  Ice is your friend Ibuprofen  L-arginine daily could help muscle aches  See me again in 3 weeks

## 2018-03-20 NOTE — Assessment & Plan Note (Signed)
Decision today to treat with OMT was based on Physical Exam  After verbal consent patient was treated with HVLA, ME, FPR techniques in cervical, thoracic, rib, lumbar and sacral areas  Patient tolerated the procedure well with improvement in symptoms  Patient given exercises, stretches and lifestyle modifications  See medications in patient instructions if given  Patient will follow up in 4-6 weeks 

## 2018-03-20 NOTE — Assessment & Plan Note (Signed)
Sjogren's disease can be playing a role but I think it is highly unlikely to be the main problem.  I do believe that there is some anxiety and depression that is playing a role and more the tightness.  We discussed significantly about different movements.  We discussed keeping most movements in one plane and try not to do multidirectional changes.  Seems like it is not as much as with the strength training but more with the functional aspect of certain things.  Patient is responding to manipulation fairly well.  Patient should be following up with me again in 3 weeks instead of 4 weeks to make sure making progress.

## 2018-03-22 ENCOUNTER — Encounter: Payer: Self-pay | Admitting: Family Medicine

## 2018-03-22 DIAGNOSIS — M4200 Juvenile osteochondrosis of spine, site unspecified: Secondary | ICD-10-CM | POA: Diagnosis not present

## 2018-03-22 DIAGNOSIS — G8929 Other chronic pain: Secondary | ICD-10-CM | POA: Diagnosis not present

## 2018-03-22 DIAGNOSIS — M546 Pain in thoracic spine: Secondary | ICD-10-CM | POA: Diagnosis not present

## 2018-03-22 DIAGNOSIS — M999 Biomechanical lesion, unspecified: Secondary | ICD-10-CM | POA: Diagnosis not present

## 2018-03-25 DIAGNOSIS — G8929 Other chronic pain: Secondary | ICD-10-CM | POA: Diagnosis not present

## 2018-03-25 DIAGNOSIS — M999 Biomechanical lesion, unspecified: Secondary | ICD-10-CM | POA: Diagnosis not present

## 2018-03-25 DIAGNOSIS — M546 Pain in thoracic spine: Secondary | ICD-10-CM | POA: Diagnosis not present

## 2018-03-25 DIAGNOSIS — M4200 Juvenile osteochondrosis of spine, site unspecified: Secondary | ICD-10-CM | POA: Diagnosis not present

## 2018-03-28 DIAGNOSIS — E230 Hypopituitarism: Secondary | ICD-10-CM | POA: Diagnosis not present

## 2018-03-28 DIAGNOSIS — M40204 Unspecified kyphosis, thoracic region: Secondary | ICD-10-CM | POA: Diagnosis not present

## 2018-03-29 DIAGNOSIS — M999 Biomechanical lesion, unspecified: Secondary | ICD-10-CM | POA: Diagnosis not present

## 2018-03-29 DIAGNOSIS — G8929 Other chronic pain: Secondary | ICD-10-CM | POA: Diagnosis not present

## 2018-03-29 DIAGNOSIS — M4200 Juvenile osteochondrosis of spine, site unspecified: Secondary | ICD-10-CM | POA: Diagnosis not present

## 2018-03-29 DIAGNOSIS — M546 Pain in thoracic spine: Secondary | ICD-10-CM | POA: Diagnosis not present

## 2018-04-01 DIAGNOSIS — M999 Biomechanical lesion, unspecified: Secondary | ICD-10-CM | POA: Diagnosis not present

## 2018-04-01 DIAGNOSIS — G8929 Other chronic pain: Secondary | ICD-10-CM | POA: Diagnosis not present

## 2018-04-01 DIAGNOSIS — M546 Pain in thoracic spine: Secondary | ICD-10-CM | POA: Diagnosis not present

## 2018-04-01 DIAGNOSIS — M4200 Juvenile osteochondrosis of spine, site unspecified: Secondary | ICD-10-CM | POA: Diagnosis not present

## 2018-04-05 DIAGNOSIS — M546 Pain in thoracic spine: Secondary | ICD-10-CM | POA: Diagnosis not present

## 2018-04-05 DIAGNOSIS — G8929 Other chronic pain: Secondary | ICD-10-CM | POA: Diagnosis not present

## 2018-04-05 DIAGNOSIS — M4200 Juvenile osteochondrosis of spine, site unspecified: Secondary | ICD-10-CM | POA: Diagnosis not present

## 2018-04-05 DIAGNOSIS — M999 Biomechanical lesion, unspecified: Secondary | ICD-10-CM | POA: Diagnosis not present

## 2018-04-08 DIAGNOSIS — M546 Pain in thoracic spine: Secondary | ICD-10-CM | POA: Diagnosis not present

## 2018-04-08 DIAGNOSIS — G8929 Other chronic pain: Secondary | ICD-10-CM | POA: Diagnosis not present

## 2018-04-08 DIAGNOSIS — M999 Biomechanical lesion, unspecified: Secondary | ICD-10-CM | POA: Diagnosis not present

## 2018-04-08 DIAGNOSIS — M4200 Juvenile osteochondrosis of spine, site unspecified: Secondary | ICD-10-CM | POA: Diagnosis not present

## 2018-04-11 ENCOUNTER — Ambulatory Visit: Payer: BLUE CROSS/BLUE SHIELD | Admitting: Family Medicine

## 2018-04-11 ENCOUNTER — Encounter: Payer: Self-pay | Admitting: Family Medicine

## 2018-04-11 VITALS — BP 128/90 | HR 90 | Ht 72.0 in | Wt 264.0 lb

## 2018-04-11 DIAGNOSIS — M9901 Segmental and somatic dysfunction of cervical region: Secondary | ICD-10-CM | POA: Diagnosis not present

## 2018-04-11 DIAGNOSIS — M999 Biomechanical lesion, unspecified: Secondary | ICD-10-CM

## 2018-04-11 DIAGNOSIS — M4200 Juvenile osteochondrosis of spine, site unspecified: Secondary | ICD-10-CM

## 2018-04-11 MED ORDER — PREDNISONE 50 MG PO TABS: 50.0000 mg | ORAL_TABLET | Freq: Every day | ORAL | 0 refills | Status: DC

## 2018-04-11 NOTE — Patient Instructions (Signed)
Good to see you  Ice is your friend Sent in prednisone but try Duexis 3 times a day for 3 days first then if not better take prednisone See me again in 3-4 weeks

## 2018-04-11 NOTE — Assessment & Plan Note (Signed)
Decision today to treat with OMT was based on Physical Exam  After verbal consent patient was treated with HVLA, ME, FPR techniques in cervical, thoracic, rib,  lumbar and sacral areas  Patient tolerated the procedure well with improvement in symptoms  Patient given exercises, stretches and lifestyle modifications  See medications in patient instructions if given  Patient will follow up in 4-8 weeks 

## 2018-04-11 NOTE — Progress Notes (Signed)
[name] [address] [place], [place] [address] Phone: [phone] Subjective:     CC: Neck pain follow-up  EBX:[id number]  Frank [name] is a 28 y.o. male coming in with complaint of neck pain. Patient feels that his neck pain has not gotten any better. Pain occurring on left side and radiates down into scapula. Patient has been doing light workouts. Had to take 6 days off due to pain.  Patient denies any significant radiation down the arms or legs.  Just more tightness than usual.  Patient will be traveling make sure now that he is able to do so.      Past Medical History:  Diagnosis Date  . ADD (attention deficit disorder)   . Anxiety   . Narcolepsy through school   Past Surgical History:  Procedure Laterality Date  . KNEE SURGERY    . WISDOM TOOTH EXTRACTION  2013   Social History   Socioeconomic History  . Marital status: Single    Spouse name: Not on file  . Number of children: 0  . Years of education: Not on file  . Highest education level: Bachelor's degree (e.g., BA, AB, BS)  Occupational History    Employer: [employer]  Social Needs  . Financial resource strain: Not on file  . Food insecurity:    Worry: Not on file    Inability: Not on file  . Transportation needs:    Medical: Not on file    Non-medical: Not on file  Tobacco Use  . Smoking status: Never Smoker  . Smokeless tobacco: Never Used  Substance and Sexual Activity  . Alcohol use: Yes    Alcohol/week: 12.0 standard drinks    Types: 5 Shots of liquor, 7 Standard drinks or equivalent per week    Comment: Mostly just weekends  . Drug use: No  . Sexual activity: Yes    Birth control/protection: Condom  Lifestyle  . Physical activity:    Days per week: Not on file    Minutes per session: Not on file  . Stress: Not on file  Relationships  . Social connections:    Talks on phone: Not on file    Gets together: Not on file    Attends religious service: Not on  file    Active member of club or organization: Not on file    Attends meetings of clubs or organizations: Not on file    Relationship status: Not on file  Other Topics Concern  . Not on file  Social History Narrative   Caffeine 4-5 cups a day. Decreasing adderall has increased need for coffee.   Moved back in with his parents from [place], [place] in 02/2017 to address his chronic pain and disability more aggressively (leave of absence from work).   No Known Allergies Family History  Problem Relation Age of Onset  . Stroke Maternal Grandmother   . Cancer Paternal Grandmother        Breast  . Hypertension Paternal Grandmother   . Diabetes Paternal Grandfather   . Prostate cancer Father        and grandfather  . Other Neg Hx     Current Outpatient Medications (Endocrine & Metabolic):  .  predniSONE (DELTASONE) 50 MG tablet, Take 1 tablet (50 mg total) by mouth daily.   Current Outpatient Medications (Respiratory):  .  albuterol (PROVENTIL HFA;VENTOLIN HFA) 108 (90 Base) MCG/ACT inhaler, Inhale 2 puffs into the lungs every 4 (four) hours as needed  for wheezing or shortness of breath (cough, shortness of breath or wheezing.).  Current Outpatient Medications (Analgesics):  .  meloxicam (MOBIC) 15 MG tablet, Take 1 tablet (15 mg total) by mouth daily.   Current Outpatient Medications (Other):  Marland Kitchen  buPROPion (WELLBUTRIN XL) 300 MG 24 hr tablet, Take 1 tablet (300 mg total) by mouth daily. .  DULoxetine (CYMBALTA) 20 MG capsule, TAKE 1 CAPSULE BY MOUTH EVERY DAY .  tiZANidine (ZANAFLEX) 4 MG tablet, Take 1 tablet (4 mg total) by mouth Nightly. .  traZODone (DESYREL) 50 MG tablet, Take 1-2 tablets (50-100 mg total) by mouth at bedtime as needed for sleep. .  Vitamin D, Ergocalciferol, (DRISDOL) 50000 units CAPS capsule, Take 1 capsule (50,000 Units total) by mouth every 7 (seven) days. Marland Kitchen  zolpidem (AMBIEN) 10 MG tablet, Take 1 tablet (10 mg total) by mouth at bedtime as needed. Marland Kitchen   amphetamine-dextroamphetamine (ADDERALL) 15 MG tablet, Take 1 tablet by mouth 2 (two) times daily.    Past medical history, social, surgical and family history all reviewed in electronic medical record.  No pertanent information unless stated regarding to the chief complaint.   Review of Systems:  No headache, visual changes, nausea, vomiting, diarrhea, constipation, dizziness, abdominal pain, skin rash, fevers, chills, night sweats, weight loss, swollen lymph nodes, body aches, joint swelling, , chest pain, shortness of breath, mood changes.  Positive muscle aches  Objective  Blood pressure 128/90, pulse 90, height 6' (1.829 m), weight 264 lb (119.7 kg), SpO2 98 %.    General: No apparent distress alert and oriented x3 mood and affect normal, dressed appropriately.  HEENT: Pupils equal, extraocular movements intact  Respiratory: Patient's speak in full sentences and does not appear short of breath  Cardiovascular: No lower extremity edema, non tender, no erythema  Skin: Warm dry intact with no signs of infection or rash on extremities or on axial skeleton.  Abdomen: Soft nontender  Neuro: Cranial nerves II through XII are intact, neurovascularly intact in all extremities with 2+ DTRs and 2+ pulses.  Lymph: No lymphadenopathy of posterior or anterior cervical chain or axillae bilaterally.  Gait normal with good balance and coordination.  MSK:  tender with full range of motion and good stability and symmetric strength and tone of shoulders, elbows, wrist, hip, knee and ankles bilaterally.  Back exam does have some mild loss of lordosis.  Tightness in the thoracolumbar junction right greater than left.  Mild pain over the sacroiliac joints bilaterally.  Straight leg test.  Osteopathic findings  C6 flexed rotated and side bent left T3 extended rotated and side bent right inhaled third rib T9 extended rotated and side bent left L2 flexed rotated and side bent right Sacrum right on  right     Impression and Recommendations:     This case required medical decision making of moderate complexity. The above documentation has been reviewed and is accurate and complete [name], DO       Note: This dictation was prepared with Dragon dictation along with smaller phrase technology. Any transcriptional errors that result from this process are unintentional.

## 2018-04-11 NOTE — Assessment & Plan Note (Signed)
Patient has been doing very well.  We discussed which activities to do which wants to avoid.  Patient will continue to work on the muscle imbalances.  Prednisone given for patient's trip in case he has worsening pain again.  Follow-up with me again in 4 to 8 weeks

## 2018-04-12 DIAGNOSIS — M999 Biomechanical lesion, unspecified: Secondary | ICD-10-CM | POA: Diagnosis not present

## 2018-04-12 DIAGNOSIS — G8929 Other chronic pain: Secondary | ICD-10-CM | POA: Diagnosis not present

## 2018-04-12 DIAGNOSIS — M546 Pain in thoracic spine: Secondary | ICD-10-CM | POA: Diagnosis not present

## 2018-04-12 DIAGNOSIS — M4200 Juvenile osteochondrosis of spine, site unspecified: Secondary | ICD-10-CM | POA: Diagnosis not present

## 2018-04-22 DIAGNOSIS — M999 Biomechanical lesion, unspecified: Secondary | ICD-10-CM | POA: Diagnosis not present

## 2018-04-22 DIAGNOSIS — G8929 Other chronic pain: Secondary | ICD-10-CM | POA: Diagnosis not present

## 2018-04-22 DIAGNOSIS — M4200 Juvenile osteochondrosis of spine, site unspecified: Secondary | ICD-10-CM | POA: Diagnosis not present

## 2018-04-22 DIAGNOSIS — M546 Pain in thoracic spine: Secondary | ICD-10-CM | POA: Diagnosis not present

## 2018-04-26 DIAGNOSIS — M546 Pain in thoracic spine: Secondary | ICD-10-CM | POA: Diagnosis not present

## 2018-04-26 DIAGNOSIS — G8929 Other chronic pain: Secondary | ICD-10-CM | POA: Diagnosis not present

## 2018-04-26 DIAGNOSIS — M4200 Juvenile osteochondrosis of spine, site unspecified: Secondary | ICD-10-CM | POA: Diagnosis not present

## 2018-04-26 DIAGNOSIS — M999 Biomechanical lesion, unspecified: Secondary | ICD-10-CM | POA: Diagnosis not present

## 2018-05-03 DIAGNOSIS — M546 Pain in thoracic spine: Secondary | ICD-10-CM | POA: Diagnosis not present

## 2018-05-03 DIAGNOSIS — M4200 Juvenile osteochondrosis of spine, site unspecified: Secondary | ICD-10-CM | POA: Diagnosis not present

## 2018-05-03 DIAGNOSIS — G8929 Other chronic pain: Secondary | ICD-10-CM | POA: Diagnosis not present

## 2018-05-03 DIAGNOSIS — M999 Biomechanical lesion, unspecified: Secondary | ICD-10-CM | POA: Diagnosis not present

## 2018-05-10 DIAGNOSIS — M546 Pain in thoracic spine: Secondary | ICD-10-CM | POA: Diagnosis not present

## 2018-05-10 DIAGNOSIS — M4200 Juvenile osteochondrosis of spine, site unspecified: Secondary | ICD-10-CM | POA: Diagnosis not present

## 2018-05-10 DIAGNOSIS — M999 Biomechanical lesion, unspecified: Secondary | ICD-10-CM | POA: Diagnosis not present

## 2018-05-10 DIAGNOSIS — G8929 Other chronic pain: Secondary | ICD-10-CM | POA: Diagnosis not present

## 2018-05-13 ENCOUNTER — Ambulatory Visit: Payer: BLUE CROSS/BLUE SHIELD | Admitting: Family Medicine

## 2018-05-14 ENCOUNTER — Other Ambulatory Visit: Payer: Self-pay

## 2018-05-14 ENCOUNTER — Encounter: Payer: Self-pay | Admitting: Family Medicine

## 2018-05-14 ENCOUNTER — Ambulatory Visit: Payer: BLUE CROSS/BLUE SHIELD | Admitting: Family Medicine

## 2018-05-14 DIAGNOSIS — M542 Cervicalgia: Secondary | ICD-10-CM | POA: Diagnosis not present

## 2018-05-14 MED ORDER — NORTRIPTYLINE HCL 10 MG PO CAPS: 10.0000 mg | ORAL_CAPSULE | Freq: Every day | ORAL | 1 refills | Status: DC

## 2018-05-14 NOTE — Patient Instructions (Addendum)
Good to see you  Nortriptyline at night to help with nerve pain  Continue muscle relaxer if you need it Trigger point injections given today  See me again in 5-6 weeks or call if you need [place]

## 2018-05-14 NOTE — Progress Notes (Signed)
[name] [address] [place], [place] [address] Phone: [phone] Subjective:   Frank [name], am serving as a scribe for Dr. Antoine Primas.   CC: Neck pain  [id number]  Frank [name] is a 28 y.o. male coming in with complaint of neck pain. Patient had increase in pain 4 days ago. Unable to move neck without pain.  Patient states it has been more tightness.  Does not know exactly what he did.  Does not remember any specific injury, no significant radiation down the arm but states that some difficulty with moving the shoulder on a regular basis.      Past Medical History:  Diagnosis Date  . ADD (attention deficit disorder)   . Anxiety   . Narcolepsy through school   Past Surgical History:  Procedure Laterality Date  . KNEE SURGERY    . WISDOM TOOTH EXTRACTION  2013   Social History   Socioeconomic History  . Marital status: Single    Spouse name: Not on file  . Number of children: 0  . Years of education: Not on file  . Highest education level: Bachelor's degree (e.g., BA, AB, BS)  Occupational History    Employer: [employer]  Social Needs  . Financial resource strain: Not on file  . Food insecurity:    Worry: Not on file    Inability: Not on file  . Transportation needs:    Medical: Not on file    Non-medical: Not on file  Tobacco Use  . Smoking status: Never Smoker  . Smokeless tobacco: Never Used  Substance and Sexual Activity  . Alcohol use: Yes    Alcohol/week: 12.0 standard drinks    Types: 5 Shots of liquor, 7 Standard drinks or equivalent per week    Comment: Mostly just weekends  . Drug use: No  . Sexual activity: Yes    Birth control/protection: Condom  Lifestyle  . Physical activity:    Days per week: Not on file    Minutes per session: Not on file  . Stress: Not on file  Relationships  . Social connections:    Talks on phone: Not on file    Gets together: Not on file    Attends religious service: Not  on file    Active member of club or organization: Not on file    Attends meetings of clubs or organizations: Not on file    Relationship status: Not on file  Other Topics Concern  . Not on file  Social History Narrative   Caffeine 4-5 cups a day. Decreasing adderall has increased need for coffee.   Moved back in with his parents from [place], [place] in 02/2017 to address his chronic pain and disability more aggressively (leave of absence from work).   No Known Allergies Family History  Problem Relation Age of Onset  . Stroke Maternal Grandmother   . Cancer Paternal Grandmother        Breast  . Hypertension Paternal Grandmother   . Diabetes Paternal Grandfather   . Prostate cancer Father        and grandfather  . Other Neg Hx     Current Outpatient Medications (Endocrine & Metabolic):  .  predniSONE (DELTASONE) 50 MG tablet, Take 1 tablet (50 mg total) by mouth daily.   Current Outpatient Medications (Respiratory):  .  albuterol (PROVENTIL HFA;VENTOLIN HFA) 108 (90 Base) MCG/ACT inhaler, Inhale 2 puffs into the lungs every 4 (four) hours as needed for  wheezing or shortness of breath (cough, shortness of breath or wheezing.).  Current Outpatient Medications (Analgesics):  .  meloxicam (MOBIC) 15 MG tablet, Take 1 tablet (15 mg total) by mouth daily.   Current Outpatient Medications (Other):  Marland Kitchen  buPROPion (WELLBUTRIN XL) 300 MG 24 hr tablet, Take 1 tablet (300 mg total) by mouth daily. .  DULoxetine (CYMBALTA) 20 MG capsule, TAKE 1 CAPSULE BY MOUTH EVERY DAY .  traZODone (DESYREL) 50 MG tablet, Take 1-2 tablets (50-100 mg total) by mouth at bedtime as needed for sleep. .  Vitamin D, Ergocalciferol, (DRISDOL) 50000 units CAPS capsule, Take 1 capsule (50,000 Units total) by mouth every 7 (seven) days. Marland Kitchen  zolpidem (AMBIEN) 10 MG tablet, Take 1 tablet (10 mg total) by mouth at bedtime as needed. Marland Kitchen  amphetamine-dextroamphetamine (ADDERALL) 15 MG tablet, Take 1 tablet by mouth 2 (two) times  daily. .  nortriptyline (PAMELOR) 10 MG capsule, Take 1 capsule (10 mg total) by mouth at bedtime.    Past medical history, social, surgical and family history all reviewed in electronic medical record.  No pertanent information unless stated regarding to the chief complaint.   Review of Systems:  No headache, visual changes, nausea, vomiting, diarrhea, constipation, dizziness, abdominal pain, skin rash, fevers, chills, night sweats, weight loss, swollen lymph nodes,chest pain, shortness of breath, mood changes.  Positive muscle aches, body aches  Objective  Blood pressure 112/78, pulse 83, height 6' (1.829 m), weight 271 lb (122.9 kg), SpO2 98 %.     General: No apparent distress alert and oriented x3 mood and affect normal, dressed appropriately.  HEENT: Pupils equal, extraocular movements intact  Respiratory: Patient's speak in full sentences and does not appear short of breath  Cardiovascular: No lower extremity edema, non tender, no erythema  Skin: Warm dry intact with no signs of infection or rash on extremities or on axial skeleton.  Abdomen: Soft nontender  Neuro: Cranial nerves II through XII are intact, neurovascularly intact in all extremities with 2+ DTRs and 2+ pulses.  Lymph: No lymphadenopathy of posterior or anterior cervical chain or axillae bilaterally.  Gait normal with good balance and coordination.  MSK:  tender with full range of motion and good stability and symmetric strength and tone of shoulders, elbows, wrist, hip, knee and ankles bilaterally.  Neck exam shows loss of lordosis.  Patient does have significant tightness of the right trapezius compared to the contralateral side with multiple, 4 trigger points noted.  Negative Spurling's.   After verbal consent patient was prepped with alcohol swabs and with a 25-gauge half inch needle injected and 4 distinct trigger points in the right neck and shoulder girdle region.  Total of 3 cc of 0.5% Marcaine and 1 cc of  Kenalog 40 mg/mL used.  No blood loss.  Band-Aids placed.  Postinjection instructions given   Impression and Recommendations:     This case required medical decision making of moderate complexity. The above documentation has been reviewed and is accurate and complete [name], DO       Note: This dictation was prepared with Dragon dictation along with smaller phrase technology. Any transcriptional errors that result from this process are unintentional.

## 2018-05-14 NOTE — Assessment & Plan Note (Signed)
Patient does have some trigger points in the neck and was done today.  Discussed icing regimen and home exercise.  Discussed which activities to do which wants to avoid.  Increase activity as tolerated.  Patient did want to manipulation but secondary to the corona outbreak we discussed that we will not be doing this for the next month.  Patient was somewhat frustrated at this decision.  Discussed with patient continue the home exercises icing regimen, we discussed the possibility of nortriptyline at night that could be more beneficial as well to help with some of the nerve pain.  Patient will consider it and has been noncompliant on medications previously.  Follow-up again in 4 to 8 weeks

## 2018-05-17 DIAGNOSIS — G8929 Other chronic pain: Secondary | ICD-10-CM | POA: Diagnosis not present

## 2018-05-17 DIAGNOSIS — M546 Pain in thoracic spine: Secondary | ICD-10-CM | POA: Diagnosis not present

## 2018-05-17 DIAGNOSIS — M4200 Juvenile osteochondrosis of spine, site unspecified: Secondary | ICD-10-CM | POA: Diagnosis not present

## 2018-05-17 DIAGNOSIS — M999 Biomechanical lesion, unspecified: Secondary | ICD-10-CM | POA: Diagnosis not present

## 2018-05-20 ENCOUNTER — Ambulatory Visit: Payer: BLUE CROSS/BLUE SHIELD | Admitting: Family Medicine

## 2018-05-20 DIAGNOSIS — M4200 Juvenile osteochondrosis of spine, site unspecified: Secondary | ICD-10-CM | POA: Diagnosis not present

## 2018-05-20 DIAGNOSIS — G8929 Other chronic pain: Secondary | ICD-10-CM | POA: Diagnosis not present

## 2018-05-20 DIAGNOSIS — M546 Pain in thoracic spine: Secondary | ICD-10-CM | POA: Diagnosis not present

## 2018-05-20 DIAGNOSIS — M999 Biomechanical lesion, unspecified: Secondary | ICD-10-CM | POA: Diagnosis not present

## 2018-05-24 DIAGNOSIS — M999 Biomechanical lesion, unspecified: Secondary | ICD-10-CM | POA: Diagnosis not present

## 2018-05-24 DIAGNOSIS — G8929 Other chronic pain: Secondary | ICD-10-CM | POA: Diagnosis not present

## 2018-05-24 DIAGNOSIS — M546 Pain in thoracic spine: Secondary | ICD-10-CM | POA: Diagnosis not present

## 2018-05-24 DIAGNOSIS — M4200 Juvenile osteochondrosis of spine, site unspecified: Secondary | ICD-10-CM | POA: Diagnosis not present

## 2018-05-31 ENCOUNTER — Other Ambulatory Visit: Payer: Self-pay | Admitting: Family Medicine

## 2018-05-31 DIAGNOSIS — G8929 Other chronic pain: Secondary | ICD-10-CM | POA: Diagnosis not present

## 2018-05-31 DIAGNOSIS — M999 Biomechanical lesion, unspecified: Secondary | ICD-10-CM | POA: Diagnosis not present

## 2018-05-31 DIAGNOSIS — M4200 Juvenile osteochondrosis of spine, site unspecified: Secondary | ICD-10-CM | POA: Diagnosis not present

## 2018-05-31 DIAGNOSIS — M546 Pain in thoracic spine: Secondary | ICD-10-CM | POA: Diagnosis not present

## 2018-06-03 ENCOUNTER — Other Ambulatory Visit: Payer: Self-pay | Admitting: *Deleted

## 2018-06-03 MED ORDER — DULOXETINE HCL 20 MG PO CPEP: ORAL_CAPSULE | ORAL | 1 refills | Status: DC

## 2018-06-07 DIAGNOSIS — M546 Pain in thoracic spine: Secondary | ICD-10-CM | POA: Diagnosis not present

## 2018-06-07 DIAGNOSIS — G8929 Other chronic pain: Secondary | ICD-10-CM | POA: Diagnosis not present

## 2018-06-07 DIAGNOSIS — M999 Biomechanical lesion, unspecified: Secondary | ICD-10-CM | POA: Diagnosis not present

## 2018-06-07 DIAGNOSIS — M4200 Juvenile osteochondrosis of spine, site unspecified: Secondary | ICD-10-CM | POA: Diagnosis not present

## 2018-06-19 NOTE — Progress Notes (Signed)
[name] [address] [place], [place] [address] Phone: [phone] Subjective:      CC: Back pain follow-up  [id number]  DIYOR Frank [name] is a 28 y.o. male coming in with complaint of back pain.  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  Has chronic pain no.  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  We have attempted osteopathic manipulation.  Patient states that his left hip has been hurting for 2 weeks now. He states pain is over the left SI joint. Pain with rotation and lying down. Pain decreases with activity. Antalgic gait today.     Past Medical History:  Diagnosis Date  . ADD (attention deficit disorder)   . Anxiety   . Narcolepsy through school   Past Surgical History:  Procedure Laterality Date  . KNEE SURGERY    . WISDOM TOOTH EXTRACTION  2013   Social History   Socioeconomic History  . Marital status: Single    Spouse name: Not on file  . Number of children: 0  . Years of education: Not on file  . Highest education level: Bachelor's degree (e.g., BA, AB, BS)  Occupational History    Employer: [employer]  Social Needs  . Financial resource strain: Not on file  . Food insecurity:    Worry: Not on file    Inability: Not on file  . Transportation needs:    Medical: Not on file    Non-medical: Not on file  Tobacco Use  . Smoking status: Never Smoker  . Smokeless tobacco: Never Used  Substance and Sexual Activity  . Alcohol use: Yes    Alcohol/week: 12.0 standard drinks    Types: 5 Shots of liquor, 7 Standard drinks or equivalent per week    Comment: Mostly just weekends  . Drug use: No  . Sexual activity: Yes    Birth control/protection: Condom  Lifestyle  . Physical activity:    Days per week: Not on file    Minutes per session: Not on file  . Stress: Not on file  Relationships  . Social connections:    Talks on phone: Not on file    Gets together: Not on file    Attends religious  service: Not on file    Active member of club or organization: Not on file    Attends meetings of clubs or organizations: Not on file    Relationship status: Not on file  Other Topics Concern  . Not on file  Social History Narrative   Caffeine 4-5 cups a day. Decreasing adderall has increased need for coffee.   Moved back in with his parents from [place], [place] in 02/2017 to address his chronic pain and disability more aggressively (leave of absence from work).   No Known Allergies Family History  Problem Relation Age of Onset  . Stroke Maternal Grandmother   . Cancer Paternal Grandmother        Breast  . Hypertension Paternal Grandmother   . Diabetes Paternal Grandfather   . Prostate cancer Father        and grandfather  . Other Neg Hx     Current Outpatient Medications (Endocrine & Metabolic):  .  predniSONE (DELTASONE) 50 MG tablet, Take 1 tablet (50 mg total) by mouth daily.   Current Outpatient Medications (Respiratory):  .  albuterol (PROVENTIL HFA;VENTOLIN HFA) 108 (90 Base) MCG/ACT inhaler, Inhale 2 puffs into the lungs every 4 (four) hours as needed for  wheezing or shortness of breath (cough, shortness of breath or wheezing.).  Current Outpatient Medications (Analgesics):  .  meloxicam (MOBIC) 15 MG tablet, Take 1 tablet (15 mg total) by mouth daily.   Current Outpatient Medications (Other):  Marland Kitchen.  buPROPion (WELLBUTRIN XL) 300 MG 24 hr tablet, Take 1 tablet (300 mg total) by mouth daily. .  DULoxetine (CYMBALTA) 20 MG capsule, TAKE 1 CAPSULE BY MOUTH EVERY DAY .  nortriptyline (PAMELOR) 10 MG capsule, Take 1 capsule (10 mg total) by mouth at bedtime. Marland Kitchen.  tiZANidine (ZANAFLEX) 4 MG tablet, TAKE 1 TABLET BY MOUTH EVERY DAY IN THE EVENING .  traZODone (DESYREL) 50 MG tablet, Take 1-2 tablets (50-100 mg total) by mouth at bedtime as needed for sleep. .  Vitamin D, Ergocalciferol, (DRISDOL) 50000 units CAPS capsule, Take 1 capsule (50,000 Units total) by mouth every 7 (seven)  days. Marland Kitchen.  zolpidem (AMBIEN) 10 MG tablet, Take 1 tablet (10 mg total) by mouth at bedtime as needed. Marland Kitchen.  amphetamine-dextroamphetamine (ADDERALL) 15 MG tablet, Take 1 tablet by mouth 2 (two) times daily.    Past medical history, social, surgical and family history all reviewed in electronic medical record.  No pertanent information unless stated regarding to the chief complaint.   Review of Systems:  No headache, visual changes, nausea, vomiting, diarrhea, constipation, dizziness, abdominal pain, skin rash, fevers, chills, night sweats, weight loss, swollen lymph nodes,  joint swelling, s, chest pain, shortness of breath, mood changes.  Positive muscle aches, body aches  Objective  Blood pressure 110/80, pulse 76, height 6' (1.829 m), weight 261 lb (118.4 kg), SpO2 96 %.    General: No apparent distress alert and oriented x3 mood and affect normal, dressed appropriately.  HEENT: Pupils equal, extraocular movements intact  Respiratory: Patient's speak in full sentences and does not appear short of breath  Cardiovascular: No lower extremity edema, non tender, no erythema  Skin: Warm dry intact with no signs of infection or rash on extremities or on axial skeleton.  Abdomen: Soft nontender  Neuro: Cranial nerves II through XII are intact, neurovascularly intact in all extremities with 2+ DTRs and 2+ pulses.  Lymph: No lymphadenopathy of posterior or anterior cervical chain or axillae bilaterally.  Gait normal with good balance and coordination.  MSK:  Non tender with full range of motion and good stability and symmetric strength and tone of shoulders, elbows, wrist, hip, knee and ankles bilaterally.    Back Exam:  Inspection: Loss of lordosis Motion: Flexion 45 deg, Extension 25 deg, Side Bending to 35 deg bilaterally,  Rotation to 35 deg bilaterally  SLR laying: Negative  XSLR laying: Negative  Palpable tenderness: Tender to palpation of paraspinal musculature lumbar spine left greater  than right and significantly over the left sacroiliac joint FABER: Positive left. Sensory change: Gross sensation intact to all lumbar and sacral dermatomes.  Reflexes: 2+ at both patellar tendons, 2+ at achilles tendons, Babinski's downgoing.  Strength at foot  Plantar-flexion: 5/5 Dorsi-flexion: 5/5 Eversion: 5/5 Inversion: 5/5  Leg strength  Quad: 5/5 Hamstring: 5/5 Hip flexor: 5/5 Hip abductors: 4/5 but symmetric Gait unremarkable.  Osteopathic findings C2 flexed rotated and side bent right C6 flexed rotated and side bent left T5 extended rotated and side bent right inhaled rib T9 extended rotated and side bent left L2 flexed rotated and side bent right Sacrum left on left    Impression and Recommendations:     This case required medical decision making of moderate complexity. The above  documentation has been reviewed and is accurate and complete Lyndal Pulley, DO       Note: This dictation was prepared with Dragon dictation along with smaller phrase technology. Any transcriptional errors that result from this process are unintentional.

## 2018-06-20 ENCOUNTER — Ambulatory Visit: Payer: BLUE CROSS/BLUE SHIELD | Admitting: Family Medicine

## 2018-06-20 ENCOUNTER — Other Ambulatory Visit: Payer: Self-pay

## 2018-06-20 ENCOUNTER — Encounter: Payer: Self-pay | Admitting: Family Medicine

## 2018-06-20 VITALS — BP 110/80 | HR 76 | Ht 72.0 in | Wt 261.0 lb

## 2018-06-20 DIAGNOSIS — M25552 Pain in left hip: Secondary | ICD-10-CM

## 2018-06-20 DIAGNOSIS — M4200 Juvenile osteochondrosis of spine, site unspecified: Secondary | ICD-10-CM

## 2018-06-20 DIAGNOSIS — M999 Biomechanical lesion, unspecified: Secondary | ICD-10-CM

## 2018-06-20 MED ORDER — KETOROLAC TROMETHAMINE 60 MG/2ML IM SOLN
60.0000 mg | Freq: Once | INTRAMUSCULAR | Status: AC
  Administered 2018-06-20: 60 mg via INTRAMUSCULAR

## 2018-06-20 NOTE — Assessment & Plan Note (Signed)
Decision today to treat with OMT was based on Physical Exam  After verbal consent patient was treated with HVLA, ME, FPR techniques in cervical, thoracic rib,, lumbar and sacral areas  Patient tolerated the procedure well with improvement in symptoms  Patient given exercises, stretches and lifestyle modifications  See medications in patient instructions if given  Patient will follow up in 4 weeks

## 2018-06-20 NOTE — Assessment & Plan Note (Signed)
Patient does have underlying Sjogren's disease that could be contributing to some of the discomfort and pain.  Probably some of it is psychosomatic at this point.  Patient has not having significant breakthrough pain improvement at the moment.  Discussed with her medications.  Do not want to do chronic narcotics in this individual.  Patient has had different injections over the course of time.  We discussed the possibility of having to repeat this.  Discussed Toradol and given a Toradol injection today.  [place] and hopefully will be able to do more of the recovery.  Follow-up with me in 3 to 4 weeks.

## 2018-06-20 NOTE — Patient Instructions (Signed)
Go to see you  Lots of tightness Tried toradol injection to help with the pain and the SI joint [place] is right and keep working on it Enjoy the [employer] magnesium 400mg  at night See me again in 3 weeks

## 2018-06-27 ENCOUNTER — Ambulatory Visit: Payer: 59 | Admitting: Endocrinology

## 2018-07-05 DIAGNOSIS — G8929 Other chronic pain: Secondary | ICD-10-CM | POA: Diagnosis not present

## 2018-07-05 DIAGNOSIS — M4200 Juvenile osteochondrosis of spine, site unspecified: Secondary | ICD-10-CM | POA: Diagnosis not present

## 2018-07-05 DIAGNOSIS — M546 Pain in thoracic spine: Secondary | ICD-10-CM | POA: Diagnosis not present

## 2018-07-05 DIAGNOSIS — M999 Biomechanical lesion, unspecified: Secondary | ICD-10-CM | POA: Diagnosis not present

## 2018-07-10 ENCOUNTER — Telehealth: Payer: Self-pay

## 2018-07-10 NOTE — Telephone Encounter (Signed)
Canceled 06/27/18 appt. Called pt to reschedule appt. Asked to all back to reschedule. States, "now is not a good time."

## 2018-07-10 NOTE — Progress Notes (Signed)
[facility] [address] [place], [place] [address] Phone: [phone] Subjective:   I Ronelle NighKana Thompson am serving as a Neurosurgeonscribe for Dr. Antoine PrimasZachary Ameenah Prosser.  I'm seeing this patient by the request  of:    CC: Back pain follow-up  BJY:[id number]:Subjective  Frank [name] is a 28 y.o. male coming in with complaint of back pain. States that he is not doing well. Hasn't been able to exercise. Sitting and standing is painful.  Patient states that even daily activities become more difficult.  Patient continues to take the trazodone at night but states that he is having breakthrough pain that is waking him up.  Patient states that it seems to be unrelenting.  Continues to take some of the other medicines such as the muscle relaxer on a regular basis.      Past Medical History:  Diagnosis Date  . ADD (attention deficit disorder)   . Anxiety   . Narcolepsy through school   Past Surgical History:  Procedure Laterality Date  . KNEE SURGERY    . WISDOM TOOTH EXTRACTION  2013   Social History   Socioeconomic History  . Marital status: Single    Spouse name: Not on file  . Number of children: 0  . Years of education: Not on file  . Highest education level: Bachelor's degree (e.g., BA, AB, BS)  Occupational History    Employer: [employer]  Social Needs  . Financial resource strain: Not on file  . Food insecurity:    Worry: Not on file    Inability: Not on file  . Transportation needs:    Medical: Not on file    Non-medical: Not on file  Tobacco Use  . Smoking status: Never Smoker  . Smokeless tobacco: Never Used  Substance and Sexual Activity  . Alcohol use: Yes    Alcohol/week: 12.0 standard drinks    Types: 5 Shots of liquor, 7 Standard drinks or equivalent per week    Comment: Mostly just weekends  . Drug use: No  . Sexual activity: Yes    Birth control/protection: Condom  Lifestyle  . Physical activity:    Days per week: Not on file    Minutes per session: Not  on file  . Stress: Not on file  Relationships  . Social connections:    Talks on phone: Not on file    Gets together: Not on file    Attends religious service: Not on file    Active member of club or organization: Not on file    Attends meetings of clubs or organizations: Not on file    Relationship status: Not on file  Other Topics Concern  . Not on file  Social History Narrative   Caffeine 4-5 cups a day. Decreasing adderall has increased need for coffee.   Moved back in with his parents from [place], [place] in 02/2017 to address his chronic pain and disability more aggressively (leave of absence from work).   No Known Allergies Family History  Problem Relation Age of Onset  . Stroke Maternal Grandmother   . Cancer Paternal Grandmother        Breast  . Hypertension Paternal Grandmother   . Diabetes Paternal Grandfather   . Prostate cancer Father        and grandfather  . Other Neg Hx     Current Outpatient Medications (Endocrine & Metabolic):  .  predniSONE (DELTASONE) 50 MG tablet, Take 1 tablet (50 mg total) by mouth  daily.   Current Outpatient Medications (Respiratory):  .  albuterol (PROVENTIL HFA;VENTOLIN HFA) 108 (90 Base) MCG/ACT inhaler, Inhale 2 puffs into the lungs every 4 (four) hours as needed for wheezing or shortness of breath (cough, shortness of breath or wheezing.).  Current Outpatient Medications (Analgesics):  .  meloxicam (MOBIC) 15 MG tablet, Take 1 tablet (15 mg total) by mouth daily.   Current Outpatient Medications (Other):  Marland Kitchen  buPROPion (WELLBUTRIN XL) 300 MG 24 hr tablet, Take 1 tablet (300 mg total) by mouth daily. .  DULoxetine (CYMBALTA) 20 MG capsule, TAKE 1 CAPSULE BY MOUTH EVERY DAY .  nortriptyline (PAMELOR) 10 MG capsule, Take 1 capsule (10 mg total) by mouth at bedtime. Marland Kitchen  tiZANidine (ZANAFLEX) 4 MG tablet, TAKE 1 TABLET BY MOUTH EVERY DAY IN THE EVENING .  traZODone (DESYREL) 50 MG tablet, Take 1-2 tablets (50-100 mg total) by mouth at  bedtime as needed for sleep. .  Vitamin D, Ergocalciferol, (DRISDOL) 50000 units CAPS capsule, Take 1 capsule (50,000 Units total) by mouth every 7 (seven) days. Marland Kitchen  zolpidem (AMBIEN) 10 MG tablet, Take 1 tablet (10 mg total) by mouth at bedtime as needed. Marland Kitchen  amphetamine-dextroamphetamine (ADDERALL) 15 MG tablet, Take 1 tablet by mouth 2 (two) times daily.    Past medical history, social, surgical and family history all reviewed in electronic medical record.  No pertanent information unless stated regarding to the chief complaint.   Review of Systems:  No headache, visual changes, nausea, vomiting, diarrhea, constipation, dizziness, abdominal pain, skin rash, fevers, chills, night sweats, weight loss, swollen lymph nodes, body aches, joint swelling, chest pain, shortness of breath, mood changes.  Positive muscle aches  Objective  Blood pressure 130/80, pulse 79, height 6' (1.829 m), weight 261 lb (118.4 kg), SpO2 98 %.    General: No apparent distress alert and oriented x3 mood and affect normal, dressed appropriately.  HEENT: Pupils equal, extraocular movements intact  Respiratory: Patient's speak in full sentences and does not appear short of breath  Cardiovascular: No lower extremity edema, non tender, no erythema  Skin: Warm dry intact with no signs of infection or rash on extremities or on axial skeleton.  Abdomen: Soft nontender  Neuro: Cranial nerves II through XII are intact, neurovascularly intact in all extremities with 2+ DTRs and 2+ pulses.  Lymph: No lymphadenopathy of posterior or anterior cervical chain or axillae bilaterally.  Gait normal with good balance and coordination.  MSK:  Non tender with full range of motion and good stability and symmetric strength and tone of shoulders, elbows, wrist, hip, knee and ankles bilaterally.   Back Exam:  Inspection: Unremarkable  Motion: Flexion 35 deg, Extension 25 deg, Side Bending to 35 deg bilaterally,  Rotation to 35 deg  bilaterally  SLR laying: Negative  XSLR laying: Negative  Palpable tenderness: Tender to palpation in the paraspinal musculature. FABER: Positive Faber. Sensory change: Gross sensation intact to all lumbar and sacral dermatomes.  Reflexes: 2+ at both patellar tendons, 2+ at achilles tendons, Babinski's downgoing.  Strength at foot  Plantar-flexion: 5/5 Dorsi-flexion: 5/5 Eversion: 5/5 Inversion: 5/5  Leg strength  Quad: 5/5 Hamstring: 5/5 Hip flexor: 5/5 Hip abductors: 5/5   Osteopathic findings  C2 flexed rotated and side bent right C6 flexed rotated and side bent left T3 extended rotated and side bent right inhaled third rib T7 extended rotated and side bent left L4 flexed rotated and side bent right Sacrum right on right     Impression and  Recommendations:     This case required medical decision making of moderate complexity. The above documentation has been reviewed and is accurate and complete Lyndal Pulley, DO       Note: This dictation was prepared with Dragon dictation along with smaller phrase technology. Any transcriptional errors that result from this process are unintentional.

## 2018-07-11 ENCOUNTER — Encounter: Payer: Self-pay | Admitting: Family Medicine

## 2018-07-11 ENCOUNTER — Ambulatory Visit (INDEPENDENT_AMBULATORY_CARE_PROVIDER_SITE_OTHER): Payer: BLUE CROSS/BLUE SHIELD | Admitting: Family Medicine

## 2018-07-11 ENCOUNTER — Other Ambulatory Visit: Payer: Self-pay

## 2018-07-11 VITALS — BP 130/80 | HR 79 | Ht 72.0 in | Wt 261.0 lb

## 2018-07-11 DIAGNOSIS — M4200 Juvenile osteochondrosis of spine, site unspecified: Secondary | ICD-10-CM

## 2018-07-11 DIAGNOSIS — M5134 Other intervertebral disc degeneration, thoracic region: Secondary | ICD-10-CM | POA: Diagnosis not present

## 2018-07-11 DIAGNOSIS — M999 Biomechanical lesion, unspecified: Secondary | ICD-10-CM

## 2018-07-11 MED ORDER — METHYLPREDNISOLONE ACETATE 80 MG/ML IJ SUSP
80.0000 mg | Freq: Once | INTRAMUSCULAR | Status: AC
  Administered 2018-07-11: 80 mg via INTRAMUSCULAR

## 2018-07-11 MED ORDER — KETOROLAC TROMETHAMINE 60 MG/2ML IM SOLN
60.0000 mg | Freq: Once | INTRAMUSCULAR | Status: AC
  Administered 2018-07-11: 60 mg via INTRAMUSCULAR

## 2018-07-11 NOTE — Patient Instructions (Signed)
Great to see you  Sorry you are hurting 2 injections today  Contonue the meds Try to forsce yourself to get out and at least walk and stretch afterward.  See me again in 3 weeks and if not better may need to consider getting harkins involved again

## 2018-07-11 NOTE — Assessment & Plan Note (Signed)
Decision today to treat with OMT was based on Physical Exam  After verbal consent patient was treated with HVLA, ME, FPR techniques in cervical, thoracic, rib lumbar and sacral areas  Patient tolerated the procedure well with improvement in symptoms  Patient given exercises, stretches and lifestyle modifications  See medications in patient instructions if given  Patient will follow up in 3 weeks 

## 2018-07-11 NOTE — Assessment & Plan Note (Signed)
Mild to moderate overall.  Discussed icing regimen and home exercises.  Discussed posture and ergonomics and weight loss.  Continue Cymbalta.  May need to increase.  Patient at this point would not want to.  Patient will come back in 3 weeks.  May need referral back to pain management.

## 2018-07-24 DIAGNOSIS — F988 Other specified behavioral and emotional disorders with onset usually occurring in childhood and adolescence: Secondary | ICD-10-CM | POA: Diagnosis not present

## 2018-07-24 DIAGNOSIS — M545 Low back pain: Secondary | ICD-10-CM | POA: Diagnosis not present

## 2018-07-24 DIAGNOSIS — G4700 Insomnia, unspecified: Secondary | ICD-10-CM | POA: Diagnosis not present

## 2018-07-24 DIAGNOSIS — G8929 Other chronic pain: Secondary | ICD-10-CM | POA: Diagnosis not present

## 2018-07-31 ENCOUNTER — Other Ambulatory Visit: Payer: Self-pay | Admitting: Family Medicine

## 2018-08-01 ENCOUNTER — Ambulatory Visit (INDEPENDENT_AMBULATORY_CARE_PROVIDER_SITE_OTHER)
Admission: RE | Admit: 2018-08-01 | Discharge: 2018-08-01 | Disposition: A | Discharge status: Home / Self Care | Payer: BC Managed Care – PPO | Source: Ambulatory Visit | Attending: Family Medicine | Admitting: Family Medicine

## 2018-08-01 ENCOUNTER — Ambulatory Visit: Payer: BLUE CROSS/BLUE SHIELD | Admitting: Family Medicine

## 2018-08-01 ENCOUNTER — Other Ambulatory Visit: Payer: Self-pay

## 2018-08-01 ENCOUNTER — Encounter: Payer: Self-pay | Admitting: Family Medicine

## 2018-08-01 VITALS — BP 116/70 | HR 81 | Ht 72.0 in | Wt 270.0 lb

## 2018-08-01 DIAGNOSIS — M4200 Juvenile osteochondrosis of spine, site unspecified: Secondary | ICD-10-CM

## 2018-08-01 DIAGNOSIS — M25552 Pain in left hip: Secondary | ICD-10-CM | POA: Diagnosis not present

## 2018-08-01 DIAGNOSIS — G8929 Other chronic pain: Secondary | ICD-10-CM

## 2018-08-01 DIAGNOSIS — M999 Biomechanical lesion, unspecified: Secondary | ICD-10-CM | POA: Diagnosis not present

## 2018-08-01 NOTE — Patient Instructions (Addendum)
Good to see you  Xray downstairs on the hip Muscle and fatigue related Muscle relaxer nightly for 3 nights See me again in 4 weeks

## 2018-08-01 NOTE — Assessment & Plan Note (Signed)
Decision today to treat with OMT was based on Physical Exam  After verbal consent patient was treated with HVLA, ME, FPR techniques in cervical, thoracic, rib,  lumbar and sacral areas  Patient tolerated the procedure well with improvement in symptoms  Patient given exercises, stretches and lifestyle modifications  See medications in patient instructions if given  Patient will follow up in 4-8 weeks 

## 2018-08-01 NOTE — Progress Notes (Signed)
Tawana ScaleZach Smith D.O. [facility] [address] [place], [place] [address] Phone: [phone] Subjective:   I Frank NighKana Katrese [name] am serving as a Neurosurgeonscribe for Dr. Antoine PrimasZachary Smith.   CC: Low back pain follow-up  BJY:NWGNFAOZHYHPI:Subjective  Frank [name] is a 28 y.o. male coming in with complaint of back pain. States that he is a little better than last time. Lower back gives him trouble. Left Hip got better but 6 days ago he states he couldn't walk or move. Believes something is wrong with his hip. Sitting is painful. Has been using inversion table to help.   Patient does have a history of Sjogren's disease. Just feels more tight on the lower back.  Or muscle related.  Started to work out with his trainer again.     Past Medical History:  Diagnosis Date  . ADD (attention deficit disorder)   . Anxiety   . Narcolepsy through school   Past Surgical History:  Procedure Laterality Date  . KNEE SURGERY    . WISDOM TOOTH EXTRACTION  2013   Social History   Socioeconomic History  . Marital status: Single    Spouse name: Not on file  . Number of children: 0  . Years of education: Not on file  . Highest education level: Bachelor's degree (e.g., BA, AB, BS)  Occupational History    Employer: [employer]  Social Needs  . Financial resource strain: Not on file  . Food insecurity    Worry: Not on file    Inability: Not on file  . Transportation needs    Medical: Not on file    Non-medical: Not on file  Tobacco Use  . Smoking status: Never Smoker  . Smokeless tobacco: Never Used  Substance and Sexual Activity  . Alcohol use: Yes    Alcohol/week: 12.0 standard drinks    Types: 5 Shots of liquor, 7 Standard drinks or equivalent per week    Comment: Mostly just weekends  . Drug use: No  . Sexual activity: Yes    Birth control/protection: Condom  Lifestyle  . Physical activity    Days per week: Not on file    Minutes per session: Not on file  . Stress: Not on file  Relationships  . Social  Musicianconnections    Talks on phone: Not on file    Gets together: Not on file    Attends religious service: Not on file    Active member of club or organization: Not on file    Attends meetings of clubs or organizations: Not on file    Relationship status: Not on file  Other Topics Concern  . Not on file  Social History Narrative   Caffeine 4-5 cups a day. Decreasing adderall has increased need for coffee.   Moved back in with his parents from [place], [place] in 02/2017 to address his chronic pain and disability more aggressively (leave of absence from work).   No Known Allergies Family History  Problem Relation Age of Onset  . Stroke Maternal Grandmother   . Cancer Paternal Grandmother        Breast  . Hypertension Paternal Grandmother   . Diabetes Paternal Grandfather   . Prostate cancer Father        and grandfather  . Other Neg Hx     Current Outpatient Medications (Endocrine & Metabolic):  .  predniSONE (DELTASONE) 50 MG tablet, Take 1 tablet (50 mg total) by mouth daily.   Current Outpatient Medications (Respiratory):  .  albuterol (PROVENTIL HFA;VENTOLIN HFA) 108 (90 Base) MCG/ACT inhaler, Inhale 2 puffs into the lungs every 4 (four) hours as needed for wheezing or shortness of breath (cough, shortness of breath or wheezing.).  Current Outpatient Medications (Analgesics):  .  meloxicam (MOBIC) 15 MG tablet, Take 1 tablet (15 mg total) by mouth daily.   Current Outpatient Medications (Other):  Marland Kitchen  buPROPion (WELLBUTRIN XL) 300 MG 24 hr tablet, Take 1 tablet (300 mg total) by mouth daily. .  DULoxetine (CYMBALTA) 20 MG capsule, TAKE 1 CAPSULE BY MOUTH EVERY DAY .  nortriptyline (PAMELOR) 10 MG capsule, TAKE 1 CAPSULE (10 MG TOTAL) BY MOUTH AT BEDTIME. Marland Kitchen  tiZANidine (ZANAFLEX) 4 MG tablet, TAKE 1 TABLET BY MOUTH EVERY DAY IN THE EVENING .  traZODone (DESYREL) 50 MG tablet, Take 1-2 tablets (50-100 mg total) by mouth at bedtime as needed for sleep. .  Vitamin D, Ergocalciferol,  (DRISDOL) 50000 units CAPS capsule, Take 1 capsule (50,000 Units total) by mouth every 7 (seven) days. Marland Kitchen  zolpidem (AMBIEN) 10 MG tablet, Take 1 tablet (10 mg total) by mouth at bedtime as needed. Marland Kitchen  amphetamine-dextroamphetamine (ADDERALL) 15 MG tablet, Take 1 tablet by mouth 2 (two) times daily.    Past medical history, social, surgical and family history all reviewed in electronic medical record.  No pertanent information unless stated regarding to the chief complaint.   Review of Systems:  No headache, visual changes, nausea, vomiting, diarrhea, constipation, dizziness, abdominal pain, skin rash, fevers, chills, night sweats, weight loss, swollen lymph nodes,  chest pain, shortness of breath, mood changes.  Positive muscle aches and body aches  Objective  Blood pressure 116/70, pulse 81, height 6' (1.829 m), weight 270 lb (122.5 kg), SpO2 98 %.    General: No apparent distress alert and oriented x3 mood and affect normal, dressed appropriately.  HEENT: Pupils equal, extraocular movements intact  Respiratory: Patient's speak in full sentences and does not appear short of breath  Cardiovascular: No lower extremity edema, non tender, no erythema  Skin: Warm dry intact with no signs of infection or rash on extremities or on axial skeleton.  Abdomen: Soft nontender  Neuro: Cranial nerves II through XII are intact, neurovascularly intact in all extremities with 2+ DTRs and 2+ pulses.  Lymph: No lymphadenopathy of posterior or anterior cervical chain or axillae bilaterally.  Gait normal with good balance and coordination.  MSK:  Non tender with full range of motion and good stability and symmetric strength and tone of shoulders, elbows, wrist, hip, knee and ankles bilaterally.  Back Exam:  Inspection: Loss of lordosis Motion: Flexion 35 deg, Extension 25 deg, Side Bending to 35 deg bilaterally,  Rotation to 30 deg bilaterally  SLR laying: Negative  XSLR laying: Negative  Palpable  tenderness: Diffuse tenderness in the soft tissue of the lumbar spine more left than right. FABER: Tightness bilaterally. Sensory change: Gross sensation intact to all lumbar and sacral dermatomes.  Reflexes: 2+ at both patellar tendons, 2+ at achilles tendons, Babinski's downgoing.  Strength at foot  Plantar-flexion: 5/5 Dorsi-flexion: 5/5 Eversion: 5/5 Inversion: 5/5  Leg strength  Quad: 5/5 Hamstring: 5/5 Hip flexor: 5/5 Hip abductors: 5/5  Gait unremarkable.   Osteopathic findings C4 flexed rotated and side bent left C7 flexed rotated and side bent left T3 extended rotated and side bent right inhaled third rib T11 extended rotated and side bent left L2 flexed rotated and side bent right Sacrum right on right    Impression and Recommendations:  This case required medical decision making of moderate complexity. The above documentation has been reviewed and is accurate and complete Lyndal Pulley, DO       Note: This dictation was prepared with Dragon dictation along with smaller phrase technology. Any transcriptional errors that result from this process are unintentional.

## 2018-08-01 NOTE — Assessment & Plan Note (Signed)
Patient continues to have aching and pain.  More of a left sided hip pain.  We will get an x-ray today to further evaluate.  I do feel that most of this is muscle related.  Started working out again and I think that is contributing to some of it.  We discussed diet and exercises in greater detail.  Attempted osteopathic manipulation again today.  Follow-up again in 4 to 8 weeks

## 2018-08-22 ENCOUNTER — Encounter: Payer: Self-pay | Admitting: Family Medicine

## 2018-08-22 ENCOUNTER — Other Ambulatory Visit: Payer: Self-pay

## 2018-08-22 ENCOUNTER — Ambulatory Visit: Payer: BC Managed Care – PPO | Admitting: Family Medicine

## 2018-08-22 VITALS — BP 124/80 | HR 97 | Ht 72.0 in | Wt 268.0 lb

## 2018-08-22 DIAGNOSIS — M999 Biomechanical lesion, unspecified: Secondary | ICD-10-CM | POA: Diagnosis not present

## 2018-08-22 DIAGNOSIS — M4200 Juvenile osteochondrosis of spine, site unspecified: Secondary | ICD-10-CM

## 2018-08-22 NOTE — Progress Notes (Signed)
Frank [name] D.O. [facility] [address] [place], [place] [address] Phone: [phone] Subjective:   I Frank NighKana [name] am serving as a Neurosurgeonscribe for Dr. Antoine PrimasZachary Nickoles [name].   CC: Back pain follow-up  WGN:FAOZHYQMVHHPI:Subjective  Frank PallSamuel J [name] is a 28 y.o. male coming in with complaint of back pain.  Patient has Scheuermann disease.  Patient has had chronic pain for quite some time.  More likely chronic pain syndrome with myofascial pain.  Has responded somewhat to osteopathic manipulation.  Patient states he does not feel well. Worked out yesterday and believes he is tight. Hip is doing a lot better but sitting and certain movements with working out is painful.      Past Medical History:  Diagnosis Date  . ADD (attention deficit disorder)   . Anxiety   . Narcolepsy through school   Past Surgical History:  Procedure Laterality Date  . KNEE SURGERY    . WISDOM TOOTH EXTRACTION  2013   Social History   Socioeconomic History  . Marital status: Single    Spouse name: Not on file  . Number of children: 0  . Years of education: Not on file  . Highest education level: Bachelor's degree (e.g., BA, AB, BS)  Occupational History    Employer: [employer]  Social Needs  . Financial resource strain: Not on file  . Food insecurity    Worry: Not on file    Inability: Not on file  . Transportation needs    Medical: Not on file    Non-medical: Not on file  Tobacco Use  . Smoking status: Never Smoker  . Smokeless tobacco: Never Used  Substance and Sexual Activity  . Alcohol use: Yes    Alcohol/week: 12.0 standard drinks    Types: 5 Shots of liquor, 7 Standard drinks or equivalent per week    Comment: Mostly just weekends  . Drug use: No  . Sexual activity: Yes    Birth control/protection: Condom  Lifestyle  . Physical activity    Days per week: Not on file    Minutes per session: Not on file  . Stress: Not on file  Relationships  . Social Musicianconnections    Talks on phone: Not on file   Gets together: Not on file    Attends religious service: Not on file    Active member of club or organization: Not on file    Attends meetings of clubs or organizations: Not on file    Relationship status: Not on file  Other Topics Concern  . Not on file  Social History Narrative   Caffeine 4-5 cups a day. Decreasing adderall has increased need for coffee.   Moved back in with [place] parents from [place], [place] in 02/2017 to address his chronic pain and disability more aggressively (leave of absence from work).   No Known Allergies Family History  Problem Relation Age of Onset  . Stroke Maternal Grandmother   . Cancer Paternal Grandmother        Breast  . Hypertension Paternal Grandmother   . Diabetes Paternal Grandfather   . Prostate cancer Father        and grandfather  . Other Neg Hx     Current Outpatient Medications (Endocrine & Metabolic):  .  predniSONE (DELTASONE) 50 MG tablet, Take 1 tablet (50 mg total) by mouth daily.   Current Outpatient Medications (Respiratory):  .  albuterol (PROVENTIL HFA;VENTOLIN HFA) 108 (90 Base) MCG/ACT inhaler, Inhale 2 puffs into the lungs every  4 (four) hours as needed for wheezing or shortness of breath (cough, shortness of breath or wheezing.).  Current Outpatient Medications (Analgesics):  .  meloxicam (MOBIC) 15 MG tablet, Take 1 tablet (15 mg total) by mouth daily.   Current Outpatient Medications (Other):  Marland Kitchen  buPROPion (WELLBUTRIN XL) 300 MG 24 hr tablet, Take 1 tablet (300 mg total) by mouth daily. .  DULoxetine (CYMBALTA) 20 MG capsule, TAKE 1 CAPSULE BY MOUTH EVERY DAY .  nortriptyline (PAMELOR) 10 MG capsule, TAKE 1 CAPSULE (10 MG TOTAL) BY MOUTH AT BEDTIME. Marland Kitchen  tiZANidine (ZANAFLEX) 4 MG tablet, TAKE 1 TABLET BY MOUTH EVERY DAY IN THE EVENING .  traZODone (DESYREL) 50 MG tablet, Take 1-2 tablets (50-100 mg total) by mouth at bedtime as needed for sleep. .  Vitamin D, Ergocalciferol, (DRISDOL) 50000 units CAPS capsule, Take 1 capsule  (50,000 Units total) by mouth every 7 (seven) days. Marland Kitchen  zolpidem (AMBIEN) 10 MG tablet, Take 1 tablet (10 mg total) by mouth at bedtime as needed. Marland Kitchen  amphetamine-dextroamphetamine (ADDERALL) 15 MG tablet, Take 1 tablet by mouth 2 (two) times daily.    Past medical history, social, surgical and family history all reviewed in electronic medical record.  No pertanent information unless stated regarding to the chief complaint.   Review of Systems:  No headache, visual changes, nausea, vomiting, diarrhea, constipation, dizziness, abdominal pain, skin rash, fevers, chills, night sweats, weight loss, swollen lymph nodes, body aches, joint swelling,  chest pain, shortness of breath, mood changes. Positive muscle aches   Objective  Blood pressure 124/80, pulse 97, height 6' (1.829 m), weight 268 lb (121.6 kg), SpO2 98 %.    General: No apparent distress alert and oriented x3 mood and affect normal, dressed appropriately.  HEENT: Pupils equal, extraocular movements intact  Respiratory: Patient's speak in full sentences and does not appear short of breath  Cardiovascular: No lower extremity edema, non tender, no erythema  Skin: Warm dry intact with no signs of infection or rash on extremities or on axial skeleton.  Abdomen: Soft nontender  Neuro: Cranial nerves II through XII are intact, neurovascularly intact in all extremities with 2+ DTRs and 2+ pulses.  Lymph: No lymphadenopathy of posterior or anterior cervical chain or axillae bilaterally.  Gait normal with good balance and coordination.  MSK:  Non tender with full range of motion and good stability and symmetric strength and tone of shoulders, elbows, wrist, hip, knee and ankles bilaterally.  Back exam shows tightness at the thoracolumbar juncture.  Left greater than right.  Patient does have tenderness to palpation and is out of proportion to the amount of palpation. Negative straight leg test.  Positive Faber test right greater than left.   Tender to palpation over the sacroiliac joints bilaterally.  Osteopathic findings C6 flexed rotated and side bent left T3 extended rotated and side bent right inhaled third rib T11 extended rotated and side bent left  L1 flexed rotated and side bent righleftt Sacrum right on right    Impression and Recommendations:     This case required medical decision making of moderate complexity. The above documentation has been reviewed and is accurate and complete Lyndal Pulley, DO       Note: This dictation was prepared with Dragon dictation along with smaller phrase technology. Any transcriptional errors that result from this process are unintentional.

## 2018-08-22 NOTE — Assessment & Plan Note (Signed)
Decision today to treat with OMT was based on Physical Exam  After verbal consent patient was treated with HVLA, ME, FPR techniques in cervical, thoracic, rib lumbar and sacral areas  Patient tolerated the procedure well with improvement in symptoms  Patient given exercises, stretches and lifestyle modifications  See medications in patient instructions if given  Patient will follow up in 4 weeks 

## 2018-08-22 NOTE — Patient Instructions (Addendum)
Good to see you Ice is your friend Myofacial scraping.  See em again in 4 weeks

## 2018-08-22 NOTE — Assessment & Plan Note (Signed)
Stable  HEP  Discussed weight loss

## 2018-09-19 ENCOUNTER — Ambulatory Visit (INDEPENDENT_AMBULATORY_CARE_PROVIDER_SITE_OTHER): Payer: BC Managed Care – PPO | Admitting: Family Medicine

## 2018-09-19 ENCOUNTER — Other Ambulatory Visit: Payer: Self-pay

## 2018-09-19 ENCOUNTER — Encounter: Payer: Self-pay | Admitting: Family Medicine

## 2018-09-19 VITALS — BP 116/72 | HR 84 | Ht 72.0 in | Wt 273.0 lb

## 2018-09-19 DIAGNOSIS — M999 Biomechanical lesion, unspecified: Secondary | ICD-10-CM

## 2018-09-19 DIAGNOSIS — M4200 Juvenile osteochondrosis of spine, site unspecified: Secondary | ICD-10-CM | POA: Diagnosis not present

## 2018-09-19 MED ORDER — DICLOFENAC SODIUM 75 MG PO TBEC: 75.0000 mg | DELAYED_RELEASE_TABLET | Freq: Two times a day (BID) | ORAL | 0 refills | Status: DC

## 2018-09-19 NOTE — Assessment & Plan Note (Signed)
Decision today to treat with OMT was based on Physical Exam  After verbal consent patient was treated with HVLA, ME, FPR techniques in cervical, thoracic, rib lumbar and sacral areas  Patient tolerated the procedure well with improvement in symptoms  Patient given exercises, stretches and lifestyle modifications  See medications in patient instructions if given  Patient will follow up in 4-5 weeks 

## 2018-09-19 NOTE — Progress Notes (Signed)
Frank ScaleZach [name] D.O. [facility] [address] [place], [place] [address] Phone: [phone] Subjective:    I'm seeing this patient by the request  of:    CC: Back pain follow-up  BJY:NWGNFAOZHYHPI:Subjective  Frank PallSamuel J [name] is a 28 y.o. male coming in with complaint of back pain. States he feels about the same.  Patient has not been able to go workout because his trainer has been on vacation.  Having difficulty doing it on his own.  Continues to have pain all the time.  Seems to be unrelenting.     Past Medical History:  Diagnosis Date  . ADD (attention deficit disorder)   . Anxiety   . Narcolepsy through school   Past Surgical History:  Procedure Laterality Date  . KNEE SURGERY    . WISDOM TOOTH EXTRACTION  2013   Social History   Socioeconomic History  . Marital status: Single    Spouse name: Not on file  . Number of children: 0  . Years of education: Not on file  . Highest education level: Bachelor's degree (e.g., BA, AB, BS)  Occupational History    Employer: [employer]  Social Needs  . Financial resource strain: Not on file  . Food insecurity    Worry: Not on file    Inability: Not on file  . Transportation needs    Medical: Not on file    Non-medical: Not on file  Tobacco Use  . Smoking status: Never Smoker  . Smokeless tobacco: Never Used  Substance and Sexual Activity  . Alcohol use: Yes    Alcohol/week: 12.0 standard drinks    Types: 5 Shots of liquor, 7 Standard drinks or equivalent per week    Comment: Mostly just weekends  . Drug use: No  . Sexual activity: Yes    Birth control/protection: Condom  Lifestyle  . Physical activity    Days per week: Not on file    Minutes per session: Not on file  . Stress: Not on file  Relationships  . Social Musicianconnections    Talks on phone: Not on file    Gets together: Not on file    Attends religious service: Not on file    Active member of club or organization: Not on file    Attends meetings of clubs or  organizations: Not on file    Relationship status: Not on file  Other Topics Concern  . Not on file  Social History Narrative   Caffeine 4-5 cups a day. Decreasing adderall has increased need for coffee.   Moved back in with his parents from [place], [place] in 02/2017 to address his chronic pain and disability more aggressively (leave of absence from work).   No Known Allergies Family History  Problem Relation Age of Onset  . Stroke Maternal Grandmother   . Cancer Paternal Grandmother        Breast  . Hypertension Paternal Grandmother   . Diabetes Paternal Grandfather   . Prostate cancer Father        and grandfather  . Other Neg Hx     Current Outpatient Medications (Endocrine & Metabolic):  .  predniSONE (DELTASONE) 50 MG tablet, Take 1 tablet (50 mg total) by mouth daily.   Current Outpatient Medications (Respiratory):  .  albuterol (PROVENTIL HFA;VENTOLIN HFA) 108 (90 Base) MCG/ACT inhaler, Inhale 2 puffs into the lungs every 4 (four) hours as needed for wheezing or shortness of breath (cough, shortness of breath or wheezing.).  Current Outpatient  Medications (Analgesics):  .  meloxicam (MOBIC) 15 MG tablet, Take 1 tablet (15 mg total) by mouth daily.   Current Outpatient Medications (Other):  .  amphetamine-dextroamphetamine (ADDERALL) 15 MG tablet, Take 1 tablet by mouth 2 (two) times daily. Marland Kitchen  buPROPion (WELLBUTRIN XL) 300 MG 24 hr tablet, Take 1 tablet (300 mg total) by mouth daily. .  DULoxetine (CYMBALTA) 20 MG capsule, TAKE 1 CAPSULE BY MOUTH EVERY DAY .  nortriptyline (PAMELOR) 10 MG capsule, TAKE 1 CAPSULE (10 MG TOTAL) BY MOUTH AT BEDTIME. Marland Kitchen  tiZANidine (ZANAFLEX) 4 MG tablet, TAKE 1 TABLET BY MOUTH EVERY DAY IN THE EVENING .  traZODone (DESYREL) 50 MG tablet, Take 1-2 tablets (50-100 mg total) by mouth at bedtime as needed for sleep. .  Vitamin D, Ergocalciferol, (DRISDOL) 50000 units CAPS capsule, Take 1 capsule (50,000 Units total) by mouth every 7 (seven) days. Marland Kitchen   zolpidem (AMBIEN) 10 MG tablet, Take 1 tablet (10 mg total) by mouth at bedtime as needed.    Past medical history, social, surgical and family history all reviewed in electronic medical record.  No pertanent information unless stated regarding to the chief complaint.   Review of Systems:  No headache, visual changes, nausea, vomiting, diarrhea, constipation, dizziness, abdominal pain, skin rash, fevers, chills, night sweats, weight loss, swollen lymph nodes, body aches, joint swelling, muscle aches, chest pain, shortness of breath, mood changes.   Objective  There were no vitals taken for this visit. Systems examined below as of    General: No apparent distress alert and oriented x3 mood and affect normal, dressed appropriately.  HEENT: Pupils equal, extraocular movements intact  Respiratory: Patient's speak in full sentences and does not appear short of breath  Cardiovascular: No lower extremity edema, non tender, no erythema  Skin: Warm dry intact with no signs of infection or rash on extremities or on axial skeleton.  Abdomen: Soft nontender  Neuro: Cranial nerves II through XII are intact, neurovascularly intact in all extremities with 2+ DTRs and 2+ pulses.  Lymph: No lymphadenopathy of posterior or anterior cervical chain or axillae bilaterally.  Gait normal with good balance and coordination.  MSK:  tender with full range of motion and good stability and symmetric strength and tone of shoulders, elbows, wrist, hip, knee and ankles bilaterally.  Back exam shows the patient does have poor core strength.  Significant tightness with Corky Sox test bilaterally.  Negative straight leg test.  Severely tender to palpation in the paraspinal musculature lumbar spine right greater than left  Osteopathic findings  C6 flexed rotated and side bent left T3 extended rotated and side bent right inhaled third rib T9 extended rotated and side bent left L2 flexed rotated and side bent right Sacrum  right on right    Impression and Recommendations:     This case required medical decision making of moderate complexity. The above documentation has been reviewed and is accurate and complete Lyndal Pulley, DO       Note: This dictation was prepared with Dragon dictation along with smaller phrase technology. Any transcriptional errors that result from this process are unintentional.

## 2018-09-19 NOTE — Assessment & Plan Note (Signed)
Patient has many different chronic pain but it seems to be multifactorial.  I do believe that most of patient's pain is secondary to more of the myofascial and likely more psychological.  Patient encouraged to continue to increase activity as tolerated.  Encouraged to try to focus on the feeling of better from time to time.  Patient will follow-up with me again 4 to 5 weeks.

## 2018-09-19 NOTE — Patient Instructions (Addendum)
Stop meloxicam Ice your ankle See me again in 5 weeks

## 2018-10-01 ENCOUNTER — Other Ambulatory Visit: Payer: Self-pay | Admitting: Family Medicine

## 2018-10-03 DIAGNOSIS — G8929 Other chronic pain: Secondary | ICD-10-CM | POA: Diagnosis not present

## 2018-10-03 DIAGNOSIS — G4700 Insomnia, unspecified: Secondary | ICD-10-CM | POA: Diagnosis not present

## 2018-10-03 DIAGNOSIS — M545 Low back pain: Secondary | ICD-10-CM | POA: Diagnosis not present

## 2018-10-03 DIAGNOSIS — F988 Other specified behavioral and emotional disorders with onset usually occurring in childhood and adolescence: Secondary | ICD-10-CM | POA: Diagnosis not present

## 2018-10-10 DIAGNOSIS — E230 Hypopituitarism: Secondary | ICD-10-CM | POA: Diagnosis not present

## 2018-10-22 DIAGNOSIS — Z20828 Contact with and (suspected) exposure to other viral communicable diseases: Secondary | ICD-10-CM | POA: Diagnosis not present

## 2018-10-22 NOTE — Assessment & Plan Note (Signed)
Discussed posture and ergonomics, and discussed which activities of doing which wants to avoid.  Patient continues to have pain on a daily basis.  Potentially consider the increase in Cymbalta.  Follow-up again in 4 to 8 weeks

## 2018-10-22 NOTE — Assessment & Plan Note (Signed)
Decision today to treat with OMT was based on Physical Exam  After verbal consent patient was treated with HVLA, ME, FPR techniques in cervical, thoracic, rib,  lumbar and sacral areas  Patient tolerated the procedure well with improvement in symptoms  Patient given exercises, stretches and lifestyle modifications  See medications in patient instructions if given  Patient will follow up in 4-8 weeks 

## 2018-10-22 NOTE — Progress Notes (Signed)
[name] [place], [place] [address] Phone: [phone] Subjective:   I Frank [name] am serving as a Education administrator for Dr. Hulan Saas.   CC: back pain follow up  EGB:[id number]  Frank [name] is a 28 y.o. male coming in with complaint of back pain. States the back is tight. Hip is also giving him some pain. Upper traps have chronic spasm. He has been doing dry needling for the upper traps.       Past Medical History:  Diagnosis Date  . ADD (attention deficit disorder)   . Anxiety   . Narcolepsy through school   Past Surgical History:  Procedure Laterality Date  . KNEE SURGERY    . WISDOM TOOTH EXTRACTION  2013   Social History   Socioeconomic History  . Marital status: Single    Spouse name: Not on file  . Number of children: 0  . Years of education: Not on file  . Highest education level: Bachelor's degree (e.g., BA, AB, BS)  Occupational History    Employer: [employer]  Social Needs  . Financial resource strain: Not on file  . Food insecurity    Worry: Not on file    Inability: Not on file  . Transportation needs    Medical: Not on file    Non-medical: Not on file  Tobacco Use  . Smoking status: Never Smoker  . Smokeless tobacco: Never Used  Substance and Sexual Activity  . Alcohol use: Yes    Alcohol/week: 12.0 standard drinks    Types: 5 Shots of liquor, 7 Standard drinks or equivalent per week    Comment: Mostly just weekends  . Drug use: No  . Sexual activity: Yes    Birth control/protection: Condom  Lifestyle  . Physical activity    Days per week: Not on file    Minutes per session: Not on file  . Stress: Not on file  Relationships  . Social Herbalist on phone: Not on file    Gets together: Not on file    Attends religious service: Not on file    Active member of club or organization: Not on file    Attends meetings of clubs or organizations: Not on file    Relationship status: Not on file   Other Topics Concern  . Not on file  Social History Narrative   Caffeine 4-5 cups a day. Decreasing adderall has increased need for coffee.   Moved back in with his parents from [place], [place] in 02/2017 to address his chronic pain and disability more aggressively (leave of absence from work).   No Known Allergies Family History  Problem Relation Age of Onset  . Stroke Maternal Grandmother   . Cancer Paternal Grandmother        Breast  . Hypertension Paternal Grandmother   . Diabetes Paternal Grandfather   . Prostate cancer Father        and grandfather  . Other Neg Hx     Current Outpatient Medications (Endocrine & Metabolic):  .  predniSONE (DELTASONE) 50 MG tablet, Take 1 tablet (50 mg total) by mouth daily.   Current Outpatient Medications (Respiratory):  .  albuterol (PROVENTIL HFA;VENTOLIN HFA) 108 (90 Base) MCG/ACT inhaler, Inhale 2 puffs into the lungs every 4 (four) hours as needed for wheezing or shortness of breath (cough, shortness of breath or wheezing.).  Current Outpatient Medications (Analgesics):  .  diclofenac (VOLTAREN) 75 MG EC tablet, Take  1 tablet (75 mg total) by mouth 2 (two) times daily. .  meloxicam (MOBIC) 15 MG tablet, Take 1 tablet (15 mg total) by mouth daily.   Current Outpatient Medications (Other):  Marland Kitchen.  buPROPion (WELLBUTRIN XL) 300 MG 24 hr tablet, Take 1 tablet (300 mg total) by mouth daily. .  DULoxetine (CYMBALTA) 20 MG capsule, TAKE 1 CAPSULE BY MOUTH EVERY DAY .  nortriptyline (PAMELOR) 10 MG capsule, TAKE 1 CAPSULE (10 MG TOTAL) BY MOUTH AT BEDTIME. Marland Kitchen.  tiZANidine (ZANAFLEX) 4 MG tablet, TAKE 1 TABLET BY MOUTH EVERY DAY IN THE EVENING .  traZODone (DESYREL) 50 MG tablet, Take 1-2 tablets (50-100 mg total) by mouth at bedtime as needed for sleep. .  Vitamin D, Ergocalciferol, (DRISDOL) 50000 units CAPS capsule, Take 1 capsule (50,000 Units total) by mouth every 7 (seven) days. Marland Kitchen.  zolpidem (AMBIEN) 10 MG tablet, Take 1 tablet (10 mg total) by  mouth at bedtime as needed. Marland Kitchen.  amphetamine-dextroamphetamine (ADDERALL) 15 MG tablet, Take 1 tablet by mouth 2 (two) times daily.    Past medical history, social, surgical and family history all reviewed in electronic medical record.  No pertanent information unless stated regarding to the chief complaint.   Review of Systems:  No headache, visual changes, nausea, vomiting, diarrhea, constipation, dizziness, abdominal pain, skin rash, fevers, chills, night sweats, weight loss, swollen lymph nodes, body aches, joint swelling,  chest pain, shortness of breath, mood changes.  Positive muscle aches  Objective  Blood pressure 128/80, pulse 88, height 6' (1.829 m), weight 267 lb (121.1 kg), SpO2 98 %.   General: No apparent distress alert and oriented x3 mood and affect normal, dressed appropriately.  HEENT: Pupils equal, extraocular movements intact  Respiratory: Patient's speak in full sentences and does not appear short of breath  Cardiovascular: No lower extremity edema, non tender, no erythema  Skin: Warm dry intact with no signs of infection or rash on extremities or on axial skeleton.  Abdomen: Soft nontender  Neuro: Cranial nerves II through XII are intact, neurovascularly intact in all extremities with 2+ DTRs and 2+ pulses.  Lymph: No lymphadenopathy of posterior or anterior cervical chain or axillae bilaterally.  Gait normal with good balance and coordination.  MSK:  tender with full range of motion and good stability and symmetric strength and tone of shoulders, elbows, wrist, hip, knee and ankles bilaterally.  Back Exam:  Inspection: Unremarkable  Motion: Flexion 35 deg, Extension 25 deg, Side Bending to 35 deg bilaterally,  Rotation to 35 deg bilaterally  SLR laying: Negative  XSLR laying: Negative  Palpable tenderness: Tender to palpation the paraspinal musculature lumbar spine right greater than left. FABER: negative. Sensory change: Gross sensation intact to all lumbar and  sacral dermatomes.  Reflexes: 2+ at both patellar tendons, 2+ at achilles tendons, Babinski's downgoing.  Strength at foot  Plantar-flexion: 5/5 Dorsi-flexion: 5/5 Eversion: 5/5 Inversion: 5/5  Leg strength  Quad: 5/5 Hamstring: 5/5 Hip flexor: 5/5 Hip abductors: 5/5  Gait unremarkable.  Osteopathic findings C2 flexed rotated and side bent right C7 flexed rotated and side bent left T3 extended rotated and side bent right inhaled third rib T5 extended rotated and side bent left L2 flexed rotated and side bent right Sacrum right on right    Impression and Recommendations:     This case required medical decision making of moderate complexity. The above documentation has been reviewed and is accurate and complete Frank SaaZachary M Jamaurie Bernier, DO       Note:  This dictation was prepared with Dragon dictation along with smaller phrase technology. Any transcriptional errors that result from this process are unintentional.

## 2018-10-23 ENCOUNTER — Other Ambulatory Visit: Payer: Self-pay

## 2018-10-23 ENCOUNTER — Ambulatory Visit: Payer: BC Managed Care – PPO | Admitting: Family Medicine

## 2018-10-23 ENCOUNTER — Encounter: Payer: Self-pay | Admitting: Family Medicine

## 2018-10-23 VITALS — BP 128/80 | HR 88 | Ht 72.0 in | Wt 267.0 lb

## 2018-10-23 DIAGNOSIS — M999 Biomechanical lesion, unspecified: Secondary | ICD-10-CM

## 2018-10-23 DIAGNOSIS — M4200 Juvenile osteochondrosis of spine, site unspecified: Secondary | ICD-10-CM

## 2018-10-23 NOTE — Patient Instructions (Signed)
Have a good trip Good movement See me again in 4 weeks

## 2018-11-08 DIAGNOSIS — R7989 Other specified abnormal findings of blood chemistry: Secondary | ICD-10-CM | POA: Diagnosis not present

## 2018-11-08 DIAGNOSIS — E230 Hypopituitarism: Secondary | ICD-10-CM | POA: Diagnosis not present

## 2018-11-20 ENCOUNTER — Other Ambulatory Visit: Payer: Self-pay

## 2018-11-20 ENCOUNTER — Encounter: Payer: Self-pay | Admitting: Family Medicine

## 2018-11-20 ENCOUNTER — Ambulatory Visit: Payer: BC Managed Care – PPO | Admitting: Family Medicine

## 2018-11-20 VITALS — BP 128/82 | HR 103 | Ht 72.0 in | Wt 268.0 lb

## 2018-11-20 DIAGNOSIS — M4200 Juvenile osteochondrosis of spine, site unspecified: Secondary | ICD-10-CM

## 2018-11-20 DIAGNOSIS — M999 Biomechanical lesion, unspecified: Secondary | ICD-10-CM | POA: Diagnosis not present

## 2018-11-20 NOTE — Progress Notes (Signed)
[name] [place], [place] [address] Phone: [phone] Subjective:   [name], am serving as a scribe for Dr. Hulan Saas.  I'm seeing this patient by the request  of:    CC: Low back pain follow-up  [id number]     Update- 11/20/18: Frank [name] is a 28 y.o. male coming in with complaint of back pain. Pain has been increasing since last visit. Is having some radiating pain in left side. Pain throughout the entire spine. Continues to do physical therapy 2x a week.  Patient still has some significant tightness noted.  Seen other providers for low testosterone.  So far Most recent testing has done really well.      Past Medical History:  Diagnosis Date  . ADD (attention deficit disorder)   . Anxiety   . Narcolepsy through school   Past Surgical History:  Procedure Laterality Date  . KNEE SURGERY    . WISDOM TOOTH EXTRACTION  2013   Social History   Socioeconomic History  . Marital status: Single    Spouse name: Not on file  . Number of children: 0  . Years of education: Not on file  . Highest education level: Bachelor's degree (e.g., BA, AB, BS)  Occupational History    Employer: [employer]  Social Needs  . Financial resource strain: Not on file  . Food insecurity    Worry: Not on file    Inability: Not on file  . Transportation needs    Medical: Not on file    Non-medical: Not on file  Tobacco Use  . Smoking status: Never Smoker  . Smokeless tobacco: Never Used  Substance and Sexual Activity  . Alcohol use: Yes    Alcohol/week: 12.0 standard drinks    Types: 5 Shots of liquor, 7 Standard drinks or equivalent per week    Comment: Mostly just weekends  . Drug use: No  . Sexual activity: Yes    Birth control/protection: Condom  Lifestyle  . Physical activity    Days per week: Not on file    Minutes per session: Not on file  . Stress: Not on file  Relationships  . Social Herbalist on  phone: Not on file    Gets together: Not on file    Attends religious service: Not on file    Active member of club or organization: Not on file    Attends meetings of clubs or organizations: Not on file    Relationship status: Not on file  Other Topics Concern  . Not on file  Social History Narrative   Caffeine 4-5 cups a day. Decreasing adderall has increased need for coffee.   Moved back in with his parents from [place], [place] in 02/2017 to address his chronic pain and disability more aggressively (leave of absence from work).   No Known Allergies Family History  Problem Relation Age of Onset  . Stroke Maternal Grandmother   . Cancer Paternal Grandmother        Breast  . Hypertension Paternal Grandmother   . Diabetes Paternal Grandfather   . Prostate cancer Father        and grandfather  . Other Neg Hx     Current Outpatient Medications (Endocrine & Metabolic):  .  predniSONE (DELTASONE) 50 MG tablet, Take 1 tablet (50 mg total) by mouth daily.   Current Outpatient Medications (Respiratory):  .  albuterol (PROVENTIL HFA;VENTOLIN HFA) 108 (90 Base)  MCG/ACT inhaler, Inhale 2 puffs into the lungs every 4 (four) hours as needed for wheezing or shortness of breath (cough, shortness of breath or wheezing.).  Current Outpatient Medications (Analgesics):  .  diclofenac (VOLTAREN) 75 MG EC tablet, Take 1 tablet (75 mg total) by mouth 2 (two) times daily. .  meloxicam (MOBIC) 15 MG tablet, Take 1 tablet (15 mg total) by mouth daily.   Current Outpatient Medications (Other):  Marland Kitchen  buPROPion (WELLBUTRIN XL) 300 MG 24 hr tablet, Take 1 tablet (300 mg total) by mouth daily. .  DULoxetine (CYMBALTA) 20 MG capsule, TAKE 1 CAPSULE BY MOUTH EVERY DAY .  nortriptyline (PAMELOR) 10 MG capsule, TAKE 1 CAPSULE (10 MG TOTAL) BY MOUTH AT BEDTIME. Marland Kitchen  tiZANidine (ZANAFLEX) 4 MG tablet, TAKE 1 TABLET BY MOUTH EVERY DAY IN THE EVENING .  traZODone (DESYREL) 50 MG tablet, Take 1-2 tablets (50-100 mg total)  by mouth at bedtime as needed for sleep. .  Vitamin D, Ergocalciferol, (DRISDOL) 50000 units CAPS capsule, Take 1 capsule (50,000 Units total) by mouth every 7 (seven) days. Marland Kitchen  zolpidem (AMBIEN) 10 MG tablet, Take 1 tablet (10 mg total) by mouth at bedtime as needed. Marland Kitchen  amphetamine-dextroamphetamine (ADDERALL) 15 MG tablet, Take 1 tablet by mouth 2 (two) times daily.    Past medical history, social, surgical and family history all reviewed in electronic medical record.  No pertanent information unless stated regarding to the chief complaint.   Review of Systems:  No headache, visual changes, nausea, vomiting, diarrhea, constipation, dizziness, abdominal pain, skin rash, fevers, chills, night sweats, weight loss, swollen lymph nodes, b joint swelling,  chest pain, shortness of breath, mood changes.  Positive muscle aches, body aches  Objective  Blood pressure 128/82, pulse (!) 103, height 6' (1.829 m), weight 268 lb (121.6 kg), SpO2 98 %.    General: No apparent distress alert and oriented x3 mood and affect normal, dressed appropriately.  HEENT: Pupils equal, extraocular movements intact  Respiratory: Patient's speak in full sentences and does not appear short of breath  Cardiovascular: No lower extremity edema, non tender, no erythema  Skin: Warm dry intact with no signs of infection or rash on extremities or on axial skeleton.  Abdomen: Soft nontender  Neuro: Cranial nerves II through XII are intact, neurovascularly intact in all extremities with 2+ DTRs and 2+ pulses.  Lymph: No lymphadenopathy of posterior or anterior cervical chain or axillae bilaterally.  Gait normal with good balance and coordination.  MSK:  Non tender with full range of motion and good stability and symmetric strength and tone of shoulders, elbows, wrist, hip, knee and ankles bilaterally.  Patient's low back exam does have some loss of lordosis and mild increase in stiffness from previous exam.  Patient does have  some voluntary guarding noted.  Tightness of the hamstrings bilaterally but no radicular symptoms.  Tightness with Pearlean Brownie bilaterally as well.  Tenderness diffusely and out of proportion to even light palpation in the paraspinal spinal musculature from the thoracic all the way to the lumbar.  Osteopathic findings C2 flexed rotated and side bent right C6 flexed rotated and side bent left T3 extended rotated and side bent right inhaled third rib T6 extended rotated and side bent left L2 flexed rotated and side bent right Sacrum right on right    Impression and Recommendations:     This case required medical decision making of moderate complexity. The above documentation has been reviewed and is accurate and complete [name]  Tamala Julian, DO       Note: This dictation was prepared with Dragon dictation along with smaller phrase technology. Any transcriptional errors that result from this process are unintentional.

## 2018-11-20 NOTE — Assessment & Plan Note (Signed)
Decision today to treat with OMT was based on Physical Exam  After verbal consent patient was treated with HVLA, ME, FPR techniques in cervical, thoracic, rib,  lumbar and sacral areas  Patient tolerated the procedure well with improvement in symptoms  Patient given exercises, stretches and lifestyle modifications  See medications in patient instructions if given  Patient will follow up in 4-8 weeks 

## 2018-11-20 NOTE — Patient Instructions (Addendum)
Good to see you  Ice is your friend Stay active  Get back in the groove See me again in 6 ish weeks

## 2018-11-20 NOTE — Assessment & Plan Note (Signed)
Continue tightness.  Discussed posture and ergonomics, which activities of doing which wants to avoid.  Patient will continue to work with his physical therapist as well as Physiological scientist.  Encourage patient to continue to work on core strength and stability.  Follow-up again in 4 to 8 weeks

## 2018-11-30 ENCOUNTER — Other Ambulatory Visit: Payer: Self-pay | Admitting: Family Medicine

## 2018-12-01 ENCOUNTER — Other Ambulatory Visit: Payer: Self-pay | Admitting: Family Medicine

## 2018-12-28 ENCOUNTER — Other Ambulatory Visit: Payer: Self-pay | Admitting: Family Medicine

## 2019-01-01 ENCOUNTER — Ambulatory Visit (INDEPENDENT_AMBULATORY_CARE_PROVIDER_SITE_OTHER): Payer: BC Managed Care – PPO | Admitting: Family Medicine

## 2019-01-01 ENCOUNTER — Encounter: Payer: Self-pay | Admitting: Family Medicine

## 2019-01-01 VITALS — BP 132/98 | HR 86 | Ht 72.0 in | Wt 268.0 lb

## 2019-01-01 DIAGNOSIS — M999 Biomechanical lesion, unspecified: Secondary | ICD-10-CM | POA: Diagnosis not present

## 2019-01-01 DIAGNOSIS — M5134 Other intervertebral disc degeneration, thoracic region: Secondary | ICD-10-CM

## 2019-01-01 MED ORDER — DULOXETINE HCL 30 MG PO CPEP: 30.0000 mg | ORAL_CAPSULE | Freq: Every day | ORAL | 3 refills | Status: DC

## 2019-01-01 NOTE — Assessment & Plan Note (Signed)
Decision today to treat with OMT was based on Physical Exam  After verbal consent patient was treated with HVLA, ME, FPR techniques in cervical, thoracic, rib,  lumbar and sacral areas  Patient tolerated the procedure well with improvement in symptoms  Patient given exercises, stretches and lifestyle modifications  See medications in patient instructions if given  Patient will follow up in 4-8 weeks 

## 2019-01-01 NOTE — Progress Notes (Signed)
Frank [name], Frank [name] Phone: [phone] Subjective:    CC: low back pain follow up   Frank [name]  Frank [name] is a 28 y.o. male coming in with complaint of low back pain.  Patient has had this for quite some time.  Patient has been seen multiple times.  Patient rates the severity of pain is 8 out of 10.  Feels it is more in the thoracolumbar juncture still.  Patient continues to work with his Systems analyst as well as physical therapist.  Patient is never without pain.  Patient was at the beach where he was able to play golf 3 times in a 2-day span.    Past Medical History:  Diagnosis Date  . ADD (attention deficit disorder)   . Anxiety   . Narcolepsy through school   Past Surgical History:  Procedure Laterality Date  . KNEE SURGERY    . WISDOM TOOTH EXTRACTION  2013   Social History   Socioeconomic History  . Marital status: Single    Spouse name: Not on file  . Number of children: 0  . Years of education: Not on file  . Highest education level: Bachelor's degree (e.g., BA, AB, BS)  Occupational History    Employer: [employer]  Social Needs  . Financial resource strain: Not on file  . Food insecurity    Worry: Not on file    Inability: Not on file  . Transportation needs    Medical: Not on file    Non-medical: Not on file  Tobacco Use  . Smoking status: Never Smoker  . Smokeless tobacco: Never Used  Substance and Sexual Activity  . Alcohol use: Yes    Alcohol/week: 12.0 standard drinks    Types: 5 Shots of liquor, 7 Standard drinks or equivalent per week    Comment: Mostly just weekends  . Drug use: No  . Sexual activity: Yes    Birth control/protection: Condom  Lifestyle  . Physical activity    Days per week: Not on file    Minutes per session: Not on file  . Stress: Not on file  Relationships  . Social Musician on phone: Not on file    Gets together: Not on file    Attends  religious service: Not on file    Active member of club or organization: Not on file    Attends meetings of clubs or organizations: Not on file    Relationship status: Not on file  Other Topics Concern  . Not on file  Social History Narrative   Caffeine 4-5 cups a day. Decreasing adderall has increased need for coffee.   Moved back in with his parents from [place], [place] in 02/2017 to address his chronic pain and disability more aggressively (leave of absence from work).   No Known Allergies Family History  Problem Relation Age of Onset  . Stroke Maternal Grandmother   . Cancer Paternal Grandmother        Breast  . Hypertension Paternal Grandmother   . Diabetes Paternal Grandfather   . Prostate cancer Father        and grandfather  . Other Neg Hx     Current Outpatient Medications (Endocrine & Metabolic):  .  predniSONE (DELTASONE) 50 MG tablet, Take 1 tablet (50 mg total) by mouth daily.   Current Outpatient Medications (Respiratory):  .  albuterol (PROVENTIL HFA;VENTOLIN HFA) 108 (90 Base) MCG/ACT inhaler, Inhale 2  puffs into the lungs every 4 (four) hours as needed for wheezing or shortness of breath (cough, shortness of breath or wheezing.).  Current Outpatient Medications (Analgesics):  .  diclofenac (VOLTAREN) 75 MG EC tablet, Take 1 tablet (75 mg total) by mouth 2 (two) times daily. .  meloxicam (MOBIC) 15 MG tablet, Take 1 tablet (15 mg total) by mouth daily.   Current Outpatient Medications (Other):  .  amphetamine-dextroamphetamine (ADDERALL) 15 MG tablet, Take 1 tablet by mouth 2 (two) times daily. Marland Kitchen  buPROPion (WELLBUTRIN XL) 300 MG 24 hr tablet, Take 1 tablet (300 mg total) by mouth daily. .  DULoxetine (CYMBALTA) 20 MG capsule, TAKE 1 CAPSULE BY MOUTH EVERY DAY .  DULoxetine (CYMBALTA) 30 MG capsule, Take 1 capsule (30 mg total) by mouth daily. .  nortriptyline (PAMELOR) 10 MG capsule, TAKE 1 CAPSULE (10 MG TOTAL) BY MOUTH AT BEDTIME. Marland Kitchen  tiZANidine (ZANAFLEX) 4 MG  tablet, TAKE 1 TABLET BY MOUTH EVERY DAY IN THE EVENING .  traZODone (DESYREL) 50 MG tablet, Take 1-2 tablets (50-100 mg total) by mouth at bedtime as needed for sleep. .  Vitamin D, Ergocalciferol, (DRISDOL) 50000 units CAPS capsule, Take 1 capsule (50,000 Units total) by mouth every 7 (seven) days. Marland Kitchen  zolpidem (AMBIEN) 10 MG tablet, Take 1 tablet (10 mg total) by mouth at bedtime as needed.    Past medical history, social, surgical and family history all reviewed in electronic medical record.  No pertanent information unless stated regarding to the chief complaint.   Review of Systems:  No headache, visual changes, nausea, vomiting, diarrhea, constipation, dizziness, abdominal pain, skin rash, fevers, chills, night sweats, weight loss, swollen lymph nodes, body aches, joint swelling,  chest pain, shortness of breath, mood changes.  Positive muscle aches  Objective  Blood pressure (!) 132/98, pulse 86, height 6' (1.829 m), weight 268 lb (121.6 kg), SpO2 97 %.   General: No apparent distress alert and oriented x3 mood and affect normal, dressed appropriately.  HEENT: Pupils equal, extraocular movements intact  Respiratory: Patient's speak in full sentences and does not appear short of breath  Cardiovascular: No lower extremity edema, non tender, no erythema  Skin: Warm dry intact with no signs of infection or rash on extremities or on axial skeleton.  Abdomen: Soft nontender  Neuro: Cranial nerves II through XII are intact, neurovascularly intact in all extremities with 2+ DTRs and 2+ pulses.  Lymph: No lymphadenopathy of posterior or anterior cervical chain or axillae bilaterally.  Gait patient is cautious MSK:  tender with full range of motion and good stability and symmetric strength and tone of shoulders, elbows, wrist, hip, knee and ankles bilaterally.   Neck exam shows some tightness in the thoracolumbar junction.  Patient does have mild positive Corky Sox on the right side which is a  little different than usual.  Patient's pain seems to be more localized thoracolumbar junction in the paraspinal musculature.  Mild pain in the right sacroiliac joint as well.  Osteopathic findings  C4 flexed rotated and side bent left C6 flexed rotated and side bent left T3 extended rotated and side bent right inhaled third rib T7 extended rotated and side bent left L2 flexed rotated and side bent right Sacrum right on right    Impression and Recommendations:     This case required medical decision making of moderate complexity. The above documentation has been reviewed and is accurate and complete Lyndal Pulley, DO       Note: This  dictation was prepared with Dragon dictation along with smaller phrase technology. Any transcriptional errors that result from this process are unintentional.

## 2019-01-01 NOTE — Patient Instructions (Signed)
No overhand lifting Cymbalta 30mg  See me again in 4 weeks

## 2019-01-01 NOTE — Assessment & Plan Note (Signed)
Patient continues to have more of the thoracolumbar juncture pain.  Discussed with patient icing regimen and home exercise, discussed which activities to do which wants to avoid.  Patient is to increase activity slowly over the course the next several weeks.  Patient will follow up with me again in 4 to 8 weeks

## 2019-01-26 ENCOUNTER — Other Ambulatory Visit: Payer: Self-pay | Admitting: Family Medicine

## 2019-01-27 ENCOUNTER — Encounter: Payer: Self-pay | Admitting: *Deleted

## 2019-01-29 ENCOUNTER — Encounter: Payer: Self-pay | Admitting: Family Medicine

## 2019-01-29 ENCOUNTER — Ambulatory Visit (INDEPENDENT_AMBULATORY_CARE_PROVIDER_SITE_OTHER): Payer: BC Managed Care – PPO | Admitting: Family Medicine

## 2019-01-29 VITALS — BP 112/84 | HR 86 | Ht 72.0 in | Wt 265.0 lb

## 2019-01-29 DIAGNOSIS — M999 Biomechanical lesion, unspecified: Secondary | ICD-10-CM

## 2019-01-29 DIAGNOSIS — M4200 Juvenile osteochondrosis of spine, site unspecified: Secondary | ICD-10-CM | POA: Diagnosis not present

## 2019-01-29 NOTE — Patient Instructions (Addendum)
  [address], [address] [place], [place] [address] Phone [phone]  Flonase 2x daily for 2 weeks Coop pillow Pillow cube Hand in neutral position with bench See me again 4-5 weeks

## 2019-01-29 NOTE — Assessment & Plan Note (Signed)
Decision today to treat with OMT was based on Physical Exam  After verbal consent patient was treated with HVLA, ME, FPR techniques in cervical, thoracic, rib lumbar and sacral areas  Patient tolerated the procedure well with improvement in symptoms  Patient given exercises, stretches and lifestyle modifications  See medications in patient instructions if given  Patient will follow up in 4-8 weeks 

## 2019-01-29 NOTE — Assessment & Plan Note (Signed)
Continues to have some mild discomfort.  We discussed again about posture and ergonomics, patient's pain still seems to be out of proportion to the amount of palpation.  Discussed which activities to do which was to avoid.  Patient will continue with his personal trainer as well as therapy

## 2019-01-29 NOTE — Progress Notes (Signed)
[name] [address] [place], [place] [address] Phone: [phone] Subjective:   [name], am serving as a scribe for Dr. Antoine Primas. This visit occurred during the SARS-CoV-2 public health emergency.  Safety protocols were in place, including screening questions prior to the visit, additional usage of staff PPE, and extensive cleaning of exam room while observing appropriate contact time as indicated for disinfecting solutions.     CC: Low back pain, mid back pain and neck pain follow-up  [id number]  Frank [name] is a 28 y.o. male coming in with complaint of back pain. Last seen on 01/01/2019. Has been having neck pain that is causing headaches and dizziness concurrently since last week. Continues to have some mild discomfort from time to time.  Patient states nothing is as unrelenting as it has been previously.  Is taking the Cymbalta on a regular basis and feels like it may have made some difference at the moment.    Past Medical History:  Diagnosis Date  . ADD (attention deficit disorder)   . Anxiety   . Narcolepsy through school   Past Surgical History:  Procedure Laterality Date  . KNEE SURGERY    . WISDOM TOOTH EXTRACTION  2013   Social History   Socioeconomic History  . Marital status: Single    Spouse name: Not on file  . Number of children: 0  . Years of education: Not on file  . Highest education level: Bachelor's degree (e.g., BA, AB, BS)  Occupational History    Employer: [employer]  Tobacco Use  . Smoking status: Never Smoker  . Smokeless tobacco: Never Used  Substance and Sexual Activity  . Alcohol use: Yes    Alcohol/week: 12.0 standard drinks    Types: 5 Shots of liquor, 7 Standard drinks or equivalent per week    Comment: Mostly just weekends  . Drug use: No  . Sexual activity: Yes    Birth control/protection: Condom  Other Topics Concern  . Not on file  Social History Narrative   Caffeine 4-5 cups a  day. Decreasing adderall has increased need for coffee.   Moved back in with his parents from [place], [place] in 02/2017 to address his chronic pain and disability more aggressively (leave of absence from work).   Social Determinants of Health   Financial Resource Strain:   . Difficulty of Paying Living Expenses: Not on file  Food Insecurity:   . Worried About Programme researcher, broadcasting/film/video in the Last Year: Not on file  . Ran Out of Food in the Last Year: Not on file  Transportation Needs:   . Lack of Transportation (Medical): Not on file  . Lack of Transportation (Non-Medical): Not on file  Physical Activity:   . Days of Exercise per Week: Not on file  . Minutes of Exercise per Session: Not on file  Stress:   . Feeling of Stress : Not on file  Social Connections:   . Frequency of Communication with Friends and Family: Not on file  . Frequency of Social Gatherings with Friends and Family: Not on file  . Attends Religious Services: Not on file  . Active Member of Clubs or Organizations: Not on file  . Attends Banker Meetings: Not on file  . Marital Status: Not on file   No Known Allergies Family History  Problem Relation Age of Onset  . Stroke Maternal Grandmother   . Cancer Paternal Grandmother  Breast  . Hypertension Paternal Grandmother   . Diabetes Paternal Grandfather   . Prostate cancer Father        and grandfather  . Other Neg Hx     Current Outpatient Medications (Endocrine & Metabolic):  .  predniSONE (DELTASONE) 50 MG tablet, Take 1 tablet (50 mg total) by mouth daily.   Current Outpatient Medications (Respiratory):  .  albuterol (PROVENTIL HFA;VENTOLIN HFA) 108 (90 Base) MCG/ACT inhaler, Inhale 2 puffs into the lungs every 4 (four) hours as needed for wheezing or shortness of breath (cough, shortness of breath or wheezing.).  Current Outpatient Medications (Analgesics):  .  diclofenac (VOLTAREN) 75 MG EC tablet, Take 1 tablet (75 mg total) by mouth 2  (two) times daily. .  meloxicam (MOBIC) 15 MG tablet, Take 1 tablet (15 mg total) by mouth daily.   Current Outpatient Medications (Other):  Marland Kitchen  buPROPion (WELLBUTRIN XL) 300 MG 24 hr tablet, Take 1 tablet (300 mg total) by mouth daily. .  DULoxetine (CYMBALTA) 20 MG capsule, TAKE 1 CAPSULE BY MOUTH EVERY DAY .  DULoxetine (CYMBALTA) 30 MG capsule, Take 1 capsule (30 mg total) by mouth daily. .  nortriptyline (PAMELOR) 10 MG capsule, TAKE 1 CAPSULE (10 MG TOTAL) BY MOUTH AT BEDTIME. Marland Kitchen  tiZANidine (ZANAFLEX) 4 MG tablet, TAKE 1 TABLET BY MOUTH EVERY DAY IN THE EVENING .  traZODone (DESYREL) 50 MG tablet, Take 1-2 tablets (50-100 mg total) by mouth at bedtime as needed for sleep. .  Vitamin D, Ergocalciferol, (DRISDOL) 50000 units CAPS capsule, Take 1 capsule (50,000 Units total) by mouth every 7 (seven) days. Marland Kitchen  zolpidem (AMBIEN) 10 MG tablet, Take 1 tablet (10 mg total) by mouth at bedtime as needed. Marland Kitchen  amphetamine-dextroamphetamine (ADDERALL) 15 MG tablet, Take 1 tablet by mouth 2 (two) times daily.    Past medical history, social, surgical and family history all reviewed in electronic medical record.  No pertanent information unless stated regarding to the chief complaint.   Review of Systems:  No headache, visual changes, nausea, vomiting, diarrhea, constipation, dizziness, abdominal pain, skin rash, fevers, chills, night sweats, weight loss, swollen lymph nodes,  chest pain, shortness of breath, mood changes.  Positive muscle aches and body aches  Objective  Blood pressure 112/84, pulse 86, height 6' (1.829 m), weight 265 lb (120.2 kg), SpO2 98 %.    General: No apparent distress alert and oriented x3 mood and affect normal, dressed appropriately.  HEENT: Pupils equal, extraocular movements intact  Respiratory: Patient's speak in full sentences and does not appear short of breath  Cardiovascular: No lower extremity edema, non tender, no erythema  Skin: Warm dry intact with no signs  of infection or rash on extremities or on axial skeleton.  Abdomen: Soft nontender  Neuro: Cranial nerves II through XII are intact, neurovascularly intact in all extremities with 2+ DTRs and 2+ pulses.  Lymph: No lymphadenopathy of posterior or anterior cervical chain or axillae bilaterally.  Gait normal with good balance and coordination.  MSK:  tender with full range of motion and good stability and symmetric strength and tone of shoulders, elbows, wrist, hip, knee and ankles bilaterally.  Pain is still out of proportion to the amount of palpation Back exam does have some loss of lordosis of the lumbar spine.  Pain in the thoracolumbar juncture as well as in the cervical thoracic area.  Tightness in the trapezius bilaterally.  Patient's neck exam has full range of motion but patient does have significant discomfort  with extension.  No dizziness are noted.  Osteopathic findings C6 flexed rotated and side bent left T6 extended rotated and side bent right inhaled rib L2 flexed rotated and side bent right Sacrum right on right      Impression and Recommendations:     This case required medical decision making of moderate complexity. The above documentation has been reviewed and is accurate and complete Judi SaaZachary M Judie Hollick, DO       Note: This dictation was prepared with Dragon dictation along with smaller phrase technology. Any transcriptional errors that result from this process are unintentional.

## 2019-01-30 ENCOUNTER — Other Ambulatory Visit: Payer: Self-pay | Admitting: Family Medicine

## 2019-02-20 ENCOUNTER — Other Ambulatory Visit: Payer: Self-pay | Admitting: Family Medicine

## 2019-02-26 ENCOUNTER — Encounter: Payer: Self-pay | Admitting: Family Medicine

## 2019-02-26 ENCOUNTER — Other Ambulatory Visit: Payer: Self-pay

## 2019-02-26 ENCOUNTER — Ambulatory Visit (INDEPENDENT_AMBULATORY_CARE_PROVIDER_SITE_OTHER): Payer: 59 | Admitting: Family Medicine

## 2019-02-26 VITALS — BP 118/88 | HR 89 | Ht 72.0 in | Wt 262.0 lb

## 2019-02-26 DIAGNOSIS — M4200 Juvenile osteochondrosis of spine, site unspecified: Secondary | ICD-10-CM

## 2019-02-26 DIAGNOSIS — M999 Biomechanical lesion, unspecified: Secondary | ICD-10-CM | POA: Diagnosis not present

## 2019-02-26 NOTE — Assessment & Plan Note (Signed)
Making progress at this time.  Discussed posture and ergonomics.  We discussed the possibility of different things to help with extension.  Encouraged him to continue to work with his physical therapist as well as his Systems analyst.  Patient will continue to increase activity slowly.  Patient will follow up with me again 4 to 8 weeks.

## 2019-02-26 NOTE — Assessment & Plan Note (Signed)
Decision today to treat with OMT was based on Physical Exam  After verbal consent patient was treated with HVLA, ME, FPR techniques in cervical, thoracic, rib, lumbar and sacral areas  Patient tolerated the procedure well with improvement in symptoms  Patient given exercises, stretches and lifestyle modifications  See medications in patient instructions if given  Patient will follow up in 4-6 weeks 

## 2019-02-26 NOTE — Patient Instructions (Signed)
Yoga wheel See me again in 4-6 weeks

## 2019-02-26 NOTE — Progress Notes (Signed)
[facility] [place] Phone: [phone] Subjective:   Frank [name], am serving as a scribe for Dr. Hulan Saas. This visit occurred during the SARS-CoV-2 public health emergency.  Safety protocols were in place, including screening questions prior to the visit, additional usage of staff PPE, and extensive cleaning of exam room while observing appropriate contact time as indicated for disinfecting solutions.    CC:   Back pain follow-up  [id number]  Frank [name] is a 29 y.o. male coming in with complaint of back pain. Last seen on 01/29/2019 for OMT. Patient states that he has been doing ok. Feels that he has an ear infection. Dizziness has improved where as he use to have dizziness with positional changes. Back pain has overall been good. Feels that his thoracic spine is tight.      Past Medical History:  Diagnosis Date  . ADD (attention deficit disorder)   . Anxiety   . Narcolepsy through school   Past Surgical History:  Procedure Laterality Date  . KNEE SURGERY    . WISDOM TOOTH EXTRACTION  2013   Social History   Socioeconomic History  . Marital status: Single    Spouse name: Not on file  . Number of children: 0  . Years of education: Not on file  . Highest education level: Bachelor's degree (e.g., BA, AB, BS)  Occupational History    Employer: [employer]  Tobacco Use  . Smoking status: Never Smoker  . Smokeless tobacco: Never Used  Substance and Sexual Activity  . Alcohol use: Yes    Alcohol/week: 12.0 standard drinks    Types: 5 Shots of liquor, 7 Standard drinks or equivalent per week    Comment: Mostly just weekends  . Drug use: [name]  . Sexual activity: Yes    Birth control/protection: Condom  Other Topics Concern  . Not on file  Social History Narrative   Caffeine 4-5 cups a day. Decreasing adderall has increased need for coffee.   Moved back in with his parents from [place], [place] in 02/2017  to address his chronic pain and disability more aggressively (leave of absence from work).   Social Determinants of Health   Financial Resource Strain:   . Difficulty of Paying Living Expenses: Not on file  Food Insecurity:   . Worried About Charity fundraiser in the Last Year: Not on file  . Ran Out of Food in the Last Year: Not on file  Transportation Needs:   . Lack of Transportation (Medical): Not on file  . Lack of Transportation (Non-Medical): Not on file  Physical Activity:   . Days of Exercise per Week: Not on file  . Minutes of Exercise per Session: Not on file  Stress:   . Feeling of Stress : Not on file  Social Connections:   . Frequency of Communication with Friends and Family: Not on file  . Frequency of Social Gatherings with Friends and Family: Not on file  . Attends Religious Services: Not on file  . Active Member of Clubs or Organizations: Not on file  . Attends Archivist Meetings: Not on file  . Marital Status: Not on file   [name] Known Allergies Family History  Problem Relation Age of Onset  . Stroke Maternal Grandmother   . Cancer Paternal Grandmother        Breast  . Hypertension Paternal Grandmother   . Diabetes Paternal Grandfather   . Prostate cancer  Father        and grandfather  . Other Neg Hx     Current Outpatient Medications (Endocrine & Metabolic):  .  predniSONE (DELTASONE) 50 MG tablet, Take 1 tablet (50 mg total) by mouth daily.   Current Outpatient Medications (Respiratory):  .  albuterol (PROVENTIL HFA;VENTOLIN HFA) 108 (90 Base) MCG/ACT inhaler, Inhale 2 puffs into the lungs every 4 (four) hours as needed for wheezing or shortness of breath (cough, shortness of breath or wheezing.).  Current Outpatient Medications (Analgesics):  .  diclofenac (VOLTAREN) 75 MG EC tablet, Take 1 tablet (75 mg total) by mouth 2 (two) times daily. .  meloxicam (MOBIC) 15 MG tablet, Take 1 tablet (15 mg total) by mouth daily.   Current  Outpatient Medications (Other):  Marland Kitchen  buPROPion (WELLBUTRIN XL) 300 MG 24 hr tablet, Take 1 tablet (300 mg total) by mouth daily. .  DULoxetine (CYMBALTA) 20 MG capsule, TAKE 1 CAPSULE BY MOUTH EVERY DAY .  DULoxetine (CYMBALTA) 30 MG capsule, Take 1 capsule (30 mg total) by mouth daily. .  nortriptyline (PAMELOR) 10 MG capsule, TAKE 1 CAPSULE (10 MG TOTAL) BY MOUTH AT BEDTIME. Marland Kitchen  tiZANidine (ZANAFLEX) 4 MG tablet, TAKE 1 TABLET BY MOUTH EVERY DAY IN THE EVENING .  traZODone (DESYREL) 50 MG tablet, Take 1-2 tablets (50-100 mg total) by mouth at bedtime as needed for sleep. .  Vitamin D, Ergocalciferol, (DRISDOL) 50000 units CAPS capsule, Take 1 capsule (50,000 Units total) by mouth every 7 (seven) days. Marland Kitchen  zolpidem (AMBIEN) 10 MG tablet, Take 1 tablet (10 mg total) by mouth at bedtime as needed. Marland Kitchen  amphetamine-dextroamphetamine (ADDERALL) 15 MG tablet, Take 1 tablet by mouth 2 (two) times daily.    Past medical history, social, surgical and family history all reviewed in electronic medical record.  [name] pertanent information unless stated regarding to the chief complaint.   Review of Systems:  [name] headache, visual changes, nausea, vomiting, diarrhea, constipation, dizziness, abdominal pain, skin rash, fevers, chills, night sweats, weight loss, swollen lymph nodes, body aches, joint swelling, muscle aches, chest pain, shortness of breath, mood changes.   Objective  Blood pressure 118/88, pulse 89, height 6' (1.829 m), weight 262 lb (118.8 kg), SpO2 97 %.    General: [name] apparent distress alert and oriented x3 mood and affect normal, dressed appropriately.  HEENT: Pupils equal, extraocular movements intact  Respiratory: Patient's speak in full sentences and does not appear short of breath  Cardiovascular: [name] lower extremity edema, non tender, [name] erythema  Skin: Warm dry intact with [name] signs of infection or rash on extremities or on axial skeleton.  Abdomen: Soft nontender  Neuro: Cranial nerves  II through XII are intact, neurovascularly intact in all extremities with 2+ DTRs and 2+ pulses.  Lymph: [name] lymphadenopathy of posterior or anterior cervical chain or axillae bilaterally.  Gait normal with good balance and coordination.  MSK:  tender with range of motion and good stability and symmetric strength and tone of shoulders, elbows, wrist, hip, knee and ankles bilaterally.   Patient's mid back still continues to have some tenderness to palpation in the thoracolumbar junction.  Patient is having discomfort more on the paraspinal musculature not on the spinous process.  Osteopathic findings C2 flexed rotated and side bent right C4 flexed rotated and side bent left C6 flexed rotated and side bent left T3 extended rotated and side bent right inhaled third rib T9 extended rotated and side bent left L2 flexed rotated and side  bent right Sacrum right on right    Impression and Recommendations:     This case required medical decision making of moderate complexity. The above documentation has been reviewed and is accurate and complete [name], DO       Note: This dictation was prepared with Dragon dictation along with smaller phrase technology. Any transcriptional errors that result from this process are unintentional.

## 2019-03-26 ENCOUNTER — Ambulatory Visit (INDEPENDENT_AMBULATORY_CARE_PROVIDER_SITE_OTHER): Payer: 59 | Admitting: Family Medicine

## 2019-03-26 ENCOUNTER — Encounter: Payer: Self-pay | Admitting: Family Medicine

## 2019-03-26 ENCOUNTER — Other Ambulatory Visit: Payer: Self-pay

## 2019-03-26 VITALS — BP 118/80 | HR 77 | Ht 72.0 in | Wt 258.0 lb

## 2019-03-26 DIAGNOSIS — M999 Biomechanical lesion, unspecified: Secondary | ICD-10-CM | POA: Diagnosis not present

## 2019-03-26 DIAGNOSIS — M4200 Juvenile osteochondrosis of spine, site unspecified: Secondary | ICD-10-CM

## 2019-03-26 MED ORDER — KETOROLAC TROMETHAMINE 60 MG/2ML IM SOLN
60.0000 mg | Freq: Once | INTRAMUSCULAR | Status: AC
  Administered 2019-03-26: 14:00:00 60 mg via INTRAMUSCULAR

## 2019-03-26 MED ORDER — METHYLPREDNISOLONE ACETATE 80 MG/ML IJ SUSP
80.0000 mg | Freq: Once | INTRAMUSCULAR | Status: AC
  Administered 2019-03-26: 80 mg via INTRAMUSCULAR

## 2019-03-26 NOTE — Assessment & Plan Note (Signed)
Decision today to treat with OMT was based on Physical Exam  After verbal consent patient was treated with HVLA, ME, FPR techniques in cervical, thoracic, rib,  lumbar and sacral areas  Patient tolerated the procedure well with improvement in symptoms  Patient given exercises, stretches and lifestyle modifications  See medications in patient instructions if given  Patient will follow up in 4-8 weeks 

## 2019-03-26 NOTE — Patient Instructions (Signed)
Good to see you Give it a couple days then start exercises Everly well food sensitivity test See me again in 4-6 weeks

## 2019-03-26 NOTE — Assessment & Plan Note (Addendum)
Chronic problem with exacerbation.  Patient social determinants of health and include other comorbidities as well as in the residing in neck continues to give patient some difficulty with functioning.  Patient will continue with formal physical therapy as well as with his trainer.  We discussed which activities to do which wants to avoid.  Patient is to increase activity slowly over the course the next several weeks.  Follow-up with me again in 4 to 8 weeks Toradol and Depo-Medrol given today

## 2019-03-26 NOTE — Progress Notes (Signed)
[facility] [address] [place] Phone: [phone] Subjective:   I Kandace Blitz am serving as a Education administrator for Dr. Hulan Saas.  This visit occurred during the SARS-CoV-2 public health emergency.  Safety protocols were in place, including screening questions prior to the visit, additional usage of staff PPE, and extensive cleaning of exam room while observing appropriate contact time as indicated for disinfecting solutions.    CC: Low back pain follow-up  [id number]  Frank [name] is a 29 y.o. male coming in with complaint of back pain. Patient states the past 5 days have been bad. OMT. Patient has had exacerbation previously and this will essentially will be worse.  Patient states residual, throbbing aching pain.  It keeps him from doing certain activities on a regular basis.  Patient has had this previously.  Denies any radiation down the legs.  States that he sometimes can even effective sleeping.     Past Medical History:  Diagnosis Date  . ADD (attention deficit disorder)   . Anxiety   . Narcolepsy through school   Past Surgical History:  Procedure Laterality Date  . KNEE SURGERY    . WISDOM TOOTH EXTRACTION  2013   Social History   Socioeconomic History  . Marital status: Single    Spouse name: Not on file  . Number of children: 0  . Years of education: Not on file  . Highest education level: Bachelor's degree (e.g., BA, AB, BS)  Occupational History    Employer: [employer]  Tobacco Use  . Smoking status: Never Smoker  . Smokeless tobacco: Never Used  Substance and Sexual Activity  . Alcohol use: Yes    Alcohol/week: 12.0 standard drinks    Types: 5 Shots of liquor, 7 Standard drinks or equivalent per week    Comment: Mostly just weekends  . Drug use: No  . Sexual activity: Yes    Birth control/protection: Condom  Other Topics Concern  . Not on file  Social History Narrative   Caffeine 4-5 cups a day.  Decreasing adderall has increased need for coffee.   Moved back in with his parents from [place], [place] in 02/2017 to address his chronic pain and disability more aggressively (leave of absence from work).   Social Determinants of Health   Financial Resource Strain:   . Difficulty of Paying Living Expenses: Not on file  Food Insecurity:   . Worried About Charity fundraiser in the Last Year: Not on file  . Ran Out of Food in the Last Year: Not on file  Transportation Needs:   . Lack of Transportation (Medical): Not on file  . Lack of Transportation (Non-Medical): Not on file  Physical Activity:   . Days of Exercise per Week: Not on file  . Minutes of Exercise per Session: Not on file  Stress:   . Feeling of Stress : Not on file  Social Connections:   . Frequency of Communication with Friends and Family: Not on file  . Frequency of Social Gatherings with Friends and Family: Not on file  . Attends Religious Services: Not on file  . Active Member of Clubs or Organizations: Not on file  . Attends Archivist Meetings: Not on file  . Marital Status: Not on file   No Known Allergies Family History  Problem Relation Age of Onset  . Stroke Maternal Grandmother   . Cancer Paternal Grandmother        Breast  .  Hypertension Paternal Grandmother   . Diabetes Paternal Grandfather   . Prostate cancer Father        and grandfather  . Other Neg Hx     Current Outpatient Medications (Endocrine & Metabolic):  .  predniSONE (DELTASONE) 50 MG tablet, Take 1 tablet (50 mg total) by mouth daily.   Current Outpatient Medications (Respiratory):  .  albuterol (PROVENTIL HFA;VENTOLIN HFA) 108 (90 Base) MCG/ACT inhaler, Inhale 2 puffs into the lungs every 4 (four) hours as needed for wheezing or shortness of breath (cough, shortness of breath or wheezing.).  Current Outpatient Medications (Analgesics):  .  diclofenac (VOLTAREN) 75 MG EC tablet, Take 1 tablet (75 mg total) by mouth 2 (two)  times daily. .  meloxicam (MOBIC) 15 MG tablet, Take 1 tablet (15 mg total) by mouth daily.   Current Outpatient Medications (Other):  Marland Kitchen  buPROPion (WELLBUTRIN XL) 300 MG 24 hr tablet, Take 1 tablet (300 mg total) by mouth daily. .  DULoxetine (CYMBALTA) 20 MG capsule, TAKE 1 CAPSULE BY MOUTH EVERY DAY .  DULoxetine (CYMBALTA) 30 MG capsule, Take 1 capsule (30 mg total) by mouth daily. .  nortriptyline (PAMELOR) 10 MG capsule, TAKE 1 CAPSULE (10 MG TOTAL) BY MOUTH AT BEDTIME. Marland Kitchen  tiZANidine (ZANAFLEX) 4 MG tablet, TAKE 1 TABLET BY MOUTH EVERY DAY IN THE EVENING .  traZODone (DESYREL) 50 MG tablet, Take 1-2 tablets (50-100 mg total) by mouth at bedtime as needed for sleep. .  Vitamin D, Ergocalciferol, (DRISDOL) 50000 units CAPS capsule, Take 1 capsule (50,000 Units total) by mouth every 7 (seven) days. Marland Kitchen  zolpidem (AMBIEN) 10 MG tablet, Take 1 tablet (10 mg total) by mouth at bedtime as needed. Marland Kitchen  amphetamine-dextroamphetamine (ADDERALL) 15 MG tablet, Take 1 tablet by mouth 2 (two) times daily.   Reviewed prior external information including notes and imaging from  primary care provider As well as notes that were available from care everywhere and other healthcare systems.  Past medical history, social, surgical and family history all reviewed in electronic medical record.  No pertanent information unless stated regarding to the chief complaint.   Review of Systems:  No headache, visual changes, nausea, vomiting, diarrhea, constipation, dizziness, abdominal pain, skin rash, fevers, chills, night sweats, weight loss, swollen lymph nodes,  joint swelling, chest pain, shortness of breath, mood changes. POSITIVE muscle aches, body aches  Objective  Blood pressure 118/80, pulse 77, height 6' (1.829 m), weight 258 lb (117 kg), SpO2 98 %.   General: No apparent distress alert and oriented x3 mood and affect normal, dressed appropriately.  HEENT: Pupils equal, extraocular movements intact    Respiratory: Patient's speak in full sentences and does not appear short of breath  Cardiovascular: No lower extremity edema, non tender, no erythema  Skin: Warm dry intact with no signs of infection or rash on extremities or on axial skeleton.  Abdomen: Soft nontender  Neuro: Cranial nerves II through XII are intact, neurovascularly intact in all extremities with 2+ DTRs and 2+ pulses.  Lymph: No lymphadenopathy of posterior or anterior cervical chain or axillae bilaterally.  Gait normal with good balance and coordination.  MSK:  Non tender with full range of motion and good stability and symmetric strength and tone of shoulders, elbows, wrist, hip, knee and ankles bilaterally.  Low back exam with loss of lordosis.  Patient has more tenderness to palpation in the thoracolumbar juncture still.  Patient does have some mild limited range of motion lacking 5 to 10  degrees of rotation and side bending.  Pain seems to be a little out of proportion to the amount of palpation  Osteopathic findings C2 flexed rotated and side bent right C6 flexed rotated and side bent left T3 extended rotated and side bent right inhaled third rib T6 extended rotated and side bent left L2 flexed rotated and side bent right Sacrum right on right    Impression and Recommendations:     This case required medical decision making of moderate complexity. The above documentation has been reviewed and is accurate and complete [name], DO       Note: This dictation was prepared with Dragon dictation along with smaller phrase technology. Any transcriptional errors that result from this process are unintentional.

## 2019-03-27 ENCOUNTER — Other Ambulatory Visit: Payer: Self-pay | Admitting: Family Medicine

## 2019-03-31 ENCOUNTER — Other Ambulatory Visit: Payer: Self-pay | Admitting: Family Medicine

## 2019-04-16 ENCOUNTER — Other Ambulatory Visit: Payer: Self-pay

## 2019-04-16 ENCOUNTER — Ambulatory Visit (INDEPENDENT_AMBULATORY_CARE_PROVIDER_SITE_OTHER): Payer: 59 | Admitting: Family Medicine

## 2019-04-16 ENCOUNTER — Encounter: Payer: Self-pay | Admitting: Family Medicine

## 2019-04-16 ENCOUNTER — Ambulatory Visit (INDEPENDENT_AMBULATORY_CARE_PROVIDER_SITE_OTHER): Payer: 59

## 2019-04-16 VITALS — BP 128/88 | HR 66 | Ht 72.0 in | Wt 258.0 lb

## 2019-04-16 DIAGNOSIS — M4200 Juvenile osteochondrosis of spine, site unspecified: Secondary | ICD-10-CM

## 2019-04-16 DIAGNOSIS — M25531 Pain in right wrist: Secondary | ICD-10-CM

## 2019-04-16 DIAGNOSIS — M999 Biomechanical lesion, unspecified: Secondary | ICD-10-CM

## 2019-04-16 MED ORDER — DULOXETINE HCL 20 MG PO CPEP: 40.0000 mg | ORAL_CAPSULE | Freq: Every day | ORAL | 0 refills | Status: DC

## 2019-04-16 MED ORDER — TRAZODONE HCL 50 MG PO TABS: 25.0000 mg | ORAL_TABLET | Freq: Every evening | ORAL | 4 refills | Status: DC | PRN

## 2019-04-16 NOTE — Assessment & Plan Note (Signed)
Decision today to treat with OMT was based on Physical Exam  After verbal consent patient was treated with HVLA, ME, FPR techniques in cervical, thoracic, rib,  lumbar and sacral areas  Patient tolerated the procedure well with improvement in symptoms  Patient given exercises, stretches and lifestyle modifications  See medications in patient instructions if given  Patient will follow up in 4-8 weeks 

## 2019-04-16 NOTE — Assessment & Plan Note (Signed)
Nonspecific, x-rays ordered today.

## 2019-04-16 NOTE — Patient Instructions (Addendum)
Xray right wrist Cymbalta 40 mg  See me in 4-5 weeks

## 2019-04-16 NOTE — Assessment & Plan Note (Signed)
Patient is making some progress with the Cymbalta and will increase to 40 mg.  Warned of potential side effects.  Discussed which activities to do which wants to avoid.  Patient will increase activity slowly over the course the next several weeks.  This is a chronic problem but no significant exacerbation seems to be stable at the moment.  Social determinants of health including patient still having difficulty being able to work on a regular basis secondary to that pain.  Patient is going to be looking into different types of employment.

## 2019-04-16 NOTE — Progress Notes (Signed)
Frank [name] Phone: [phone] Subjective:   Frank [name], am serving as a scribe for Dr. Hulan Saas. This visit occurred during the SARS-CoV-2 public health emergency.  Safety protocols were in place, including screening questions prior to the visit, additional usage of staff PPE, and extensive cleaning of exam room while observing appropriate contact time as indicated for disinfecting solutions.   I'm seeing this patient by the request  of:  Patient, [name] Pcp Per  CC: Low back pain follow-up  [id number]  Frank [name] is a 29 y.o. male coming in with complaint of back pain. Last seen on 03/26/2019 for OMT. Patient states that he had an increase in pain the next week after his last visit. Patient did a lot of hiking over the weekend. Pain in the thoracic spine, left side scapula. Pain radiates up in the left side of his neck.      Past Medical History:  Diagnosis Date  . ADD (attention deficit disorder)   . Anxiety   . Narcolepsy through school   Past Surgical History:  Procedure Laterality Date  . KNEE SURGERY    . WISDOM TOOTH EXTRACTION  2013   Social History   Socioeconomic History  . Marital status: Single    Spouse name: Not on file  . Number of children: 0  . Years of education: Not on file  . Highest education level: Bachelor's degree (e.g., BA, AB, BS)  Occupational History    Employer: [employer]  Tobacco Use  . Smoking status: Never Smoker  . Smokeless tobacco: Never Used  Substance and Sexual Activity  . Alcohol use: Yes    Alcohol/week: 12.0 standard drinks    Types: 5 Shots of liquor, 7 Standard drinks or equivalent per week    Comment: Mostly just weekends  . Drug use: [name]  . Sexual activity: Yes    Birth control/protection: Condom  Other Topics Concern  . Not on file  Social History Narrative   Caffeine 4-5 cups a day. Decreasing adderall has increased need for coffee.   Moved back in with his parents from [place], [place] in 02/2017 to address his chronic pain and disability more aggressively (leave of absence from work).   Social Determinants of Health   Financial Resource Strain:   . Difficulty of Paying Living Expenses: Not on file  Food Insecurity:   . Worried About Charity fundraiser in the Last Year: Not on file  . Ran Out of Food in the Last Year: Not on file  Transportation Needs:   . Lack of Transportation (Medical): Not on file  . Lack of Transportation (Non-Medical): Not on file  Physical Activity:   . Days of Exercise per Week: Not on file  . Minutes of Exercise per Session: Not on file  Stress:   . Feeling of Stress : Not on file  Social Connections:   . Frequency of Communication with Friends and Family: Not on file  . Frequency of Social Gatherings with Friends and Family: Not on file  . Attends Religious Services: Not on file  . Active Member of Clubs or Organizations: Not on file  . Attends Archivist Meetings: Not on file  . Marital Status: Not on file   [name] Known Allergies Family History  Problem Relation Age of Onset  . Stroke Maternal Grandmother   . Cancer Paternal Grandmother        Breast  .  Hypertension Paternal Grandmother   . Diabetes Paternal Grandfather   . Prostate cancer Father        and grandfather  . Other Neg Hx     Current Outpatient Medications (Endocrine & Metabolic):  .  predniSONE (DELTASONE) 50 MG tablet, Take 1 tablet (50 mg total) by mouth daily.   Current Outpatient Medications (Respiratory):  .  albuterol (PROVENTIL HFA;VENTOLIN HFA) 108 (90 Base) MCG/ACT inhaler, Inhale 2 puffs into the lungs every 4 (four) hours as needed for wheezing or shortness of breath (cough, shortness of breath or wheezing.).  Current Outpatient Medications (Analgesics):  .  diclofenac (VOLTAREN) 75 MG EC tablet, Take 1 tablet (75 mg total) by mouth 2 (two) times daily. .  meloxicam (MOBIC) 15 MG tablet, Take 1  tablet (15 mg total) by mouth daily.   Current Outpatient Medications (Other):  Marland Kitchen  buPROPion (WELLBUTRIN XL) 300 MG 24 hr tablet, Take 1 tablet (300 mg total) by mouth daily. .  DULoxetine (CYMBALTA) 20 MG capsule, TAKE 1 CAPSULE BY MOUTH EVERY DAY .  DULoxetine (CYMBALTA) 30 MG capsule, Take 1 capsule (30 mg total) by mouth daily. .  nortriptyline (PAMELOR) 10 MG capsule, TAKE 1 CAPSULE (10 MG TOTAL) BY MOUTH AT BEDTIME. Marland Kitchen  tiZANidine (ZANAFLEX) 4 MG tablet, TAKE 1 TABLET BY MOUTH EVERY DAY IN THE EVENING .  traZODone (DESYREL) 50 MG tablet, Take 1-2 tablets (50-100 mg total) by mouth at bedtime as needed for sleep. .  Vitamin D, Ergocalciferol, (DRISDOL) 50000 units CAPS capsule, Take 1 capsule (50,000 Units total) by mouth every 7 (seven) days. Marland Kitchen  zolpidem (AMBIEN) 10 MG tablet, Take 1 tablet (10 mg total) by mouth at bedtime as needed. Marland Kitchen  amphetamine-dextroamphetamine (ADDERALL) 15 MG tablet, Take 1 tablet by mouth 2 (two) times daily. .  DULoxetine (CYMBALTA) 20 MG capsule, Take 2 capsules (40 mg total) by mouth daily. .  traZODone (DESYREL) 50 MG tablet, Take 0.5-1 tablets (25-50 mg total) by mouth at bedtime as needed for sleep.   Reviewed prior external information including notes and imaging from  primary care provider As well as notes that were available from care everywhere and other healthcare systems.  Past medical history, social, surgical and family history all reviewed in electronic medical record.  [name] pertanent information unless stated regarding to the chief complaint.   Review of Systems:  [name] headache, visual changes, nausea, vomiting, diarrhea, constipation, dizziness, abdominal pain, skin rash, fevers, chills, night sweats, weight loss, swollen lymph nodes, body aches, joint swelling, chest pain, shortness of breath, mood changes. POSITIVE muscle aches  Objective  Blood pressure 128/88, pulse 66, height 6' (1.829 m), weight 258 lb (117 kg), SpO2 98 %.   General: [name]  apparent distress alert and oriented x3 mood and affect normal, dressed appropriately.  HEENT: Pupils equal, extraocular movements intact  Respiratory: Patient's speak in full sentences and does not appear short of breath  Cardiovascular: [name] lower extremity edema, non tender, [name] erythema  Skin: Warm dry intact with [name] signs of infection or rash on extremities or on axial skeleton.  Abdomen: Soft nontender  Neuro: Cranial nerves II through XII are intact, neurovascularly intact in all extremities with 2+ DTRs and 2+ pulses.  Lymph: [name] lymphadenopathy of posterior or anterior cervical chain or axillae bilaterally.  Gait normal with good balance and coordination.  MSK:  tender with full range of motion and good stability and symmetric strength and tone of shoulders, elbows, wrist, hip, knee and ankles bilaterally.  Low back pain seems to be still there.  Patient has loss of lordosis.  Tenderness to palpation of the left thoracolumbar juncture.  Negative straight leg test, mild improvement with even FABER test bilaterally.  Still pain minorly out of proportion to the amount of palpation  Right wrist tender over the dorsal aspect.  Good grip strength noted.  Osteopathic findings C6 flexed rotated and side bent left T3 extended rotated and side bent right inhaled third rib T11 extended rotated and side bent left L2 flexed rotated and side bent right Sacrum right on right    Impression and Recommendations:     This case required medical decision making of moderate complexity. The above documentation has been reviewed and is accurate and complete Frank Saa, DO       Note: This dictation was prepared with Dragon dictation along with smaller phrase technology. Any transcriptional errors that result from this process are unintentional.

## 2019-04-28 ENCOUNTER — Other Ambulatory Visit: Payer: Self-pay | Admitting: Family Medicine

## 2019-05-02 ENCOUNTER — Other Ambulatory Visit: Payer: Self-pay | Admitting: Family Medicine

## 2019-05-12 ENCOUNTER — Other Ambulatory Visit: Payer: Self-pay | Admitting: Family Medicine

## 2019-05-14 ENCOUNTER — Other Ambulatory Visit: Payer: Self-pay

## 2019-05-14 ENCOUNTER — Encounter: Payer: Self-pay | Admitting: Family Medicine

## 2019-05-14 ENCOUNTER — Ambulatory Visit (INDEPENDENT_AMBULATORY_CARE_PROVIDER_SITE_OTHER): Payer: 59 | Admitting: Family Medicine

## 2019-05-14 VITALS — BP 140/92 | HR 101 | Ht 72.0 in | Wt 264.0 lb

## 2019-05-14 DIAGNOSIS — M999 Biomechanical lesion, unspecified: Secondary | ICD-10-CM

## 2019-05-14 DIAGNOSIS — M4200 Juvenile osteochondrosis of spine, site unspecified: Secondary | ICD-10-CM

## 2019-05-14 NOTE — Assessment & Plan Note (Signed)
Chronic problem : Mild exacerbation    Interventions this visit: Osteopathic manipulation.  Discussed continuing the Cymbalta at 40 mg We discussed with patient the importance ergonomics, home exercises, icing regimen, and over-the-counter natural products.   Future considerations but will be based on evaluation and next visit: Increasing Cymbalta to 60 mg    Return to clinic: 4 weeks

## 2019-05-14 NOTE — Patient Instructions (Addendum)
[facility] for Trigeminal neuralgia tx Coban for wrist Continue cymbalta See me again in 4 weeks

## 2019-05-14 NOTE — Assessment & Plan Note (Signed)

## 2019-05-14 NOTE — Progress Notes (Signed)
[name] [place] Phone: [phone] Subjective:   [name], am serving as a scribe for Dr. Hulan Saas. This visit occurred during the SARS-CoV-2 public health emergency.  Safety protocols were in place, including screening questions prior to the visit, additional usage of staff PPE, and extensive cleaning of exam room while observing appropriate contact time as indicated for disinfecting solutions.    I'm seeing this patient by the request  of:  Patient, [name] Per  CC: Low back pain, mid back pain follow-up  [id number]   Frank [name] is a 29 y.o. male coming in with complaint of back pain. Last seen on 04/16/2019 for OMT. Patient states that his left scapula pain has intensified. Is having burning sensation in shoulder and neck. Using his left arm is painful.   Patient was foam rolling the other day and his back spasmed. Was unable to get up off the foam roller without help from his father.  Patient continues to have discomfort and pain almost on a daily basis.  Continues to work with physical therapy, Physiological scientist, and patient is in the process of getting a second vaccination so he can go back to the gym in the regular routine then.        Past Medical History:  Diagnosis Date  . ADD (attention deficit disorder)   . Anxiety   . Narcolepsy through school   Past Surgical History:  Procedure Laterality Date  . KNEE SURGERY    . WISDOM TOOTH EXTRACTION  2013   Social History   Socioeconomic History  . Marital status: Single    Spouse name: Not on file  . Number of children: 0  . Years of education: Not on file  . Highest education level: Bachelor's degree (e.g., BA, AB, BS)  Occupational History    Employer: [employer]  Tobacco Use  . Smoking status: Never Smoker  . Smokeless tobacco: Never Used  Substance and Sexual Activity  . Alcohol use: Yes    Alcohol/week: 12.0 standard drinks   Types: 5 Shots of liquor, 7 Standard drinks or equivalent per week    Comment: Mostly just weekends  . Drug use: No  . Sexual activity: Yes    Birth control/protection: Condom  Other Topics Concern  . Not on file  Social History Narrative   Caffeine 4-5 cups a day. Decreasing adderall has increased need for coffee.   Moved back in with his parents from [place], [place] in 02/2017 to address his chronic pain and disability more aggressively (leave of absence from work).   Social Determinants of Health   Financial Resource Strain:   . Difficulty of Paying Living Expenses:   Food Insecurity:   . Worried About Charity fundraiser in the Last Year:   . Arboriculturist in the Last Year:   Transportation Needs:   . Film/video editor (Medical):   Marland Kitchen Lack of Transportation (Non-Medical):   Physical Activity:   . Days of Exercise per Week:   . Minutes of Exercise per Session:   Stress:   . Feeling of Stress :   Social Connections:   . Frequency of Communication with Friends and Family:   . Frequency of Social Gatherings with Friends and Family:   . Attends Religious Services:   . Active Member of Clubs or Organizations:   . Attends Archivist Meetings:   Marland Kitchen Marital Status:    No  Known Allergies Family History  Problem Relation Age of Onset  . Stroke Maternal Grandmother   . Cancer Paternal Grandmother        Breast  . Hypertension Paternal Grandmother   . Diabetes Paternal Grandfather   . Prostate cancer Father        and grandfather  . Other Neg Hx     Current Outpatient Medications (Endocrine & Metabolic):  .  predniSONE (DELTASONE) 50 MG tablet, Take 1 tablet (50 mg total) by mouth daily.   Current Outpatient Medications (Respiratory):  .  albuterol (PROVENTIL HFA;VENTOLIN HFA) 108 (90 Base) MCG/ACT inhaler, Inhale 2 puffs into the lungs every 4 (four) hours as needed for wheezing or shortness of breath (cough, shortness of breath or wheezing.).  Current  Outpatient Medications (Analgesics):  .  diclofenac (VOLTAREN) 75 MG EC tablet, Take 1 tablet (75 mg total) by mouth 2 (two) times daily. .  meloxicam (MOBIC) 15 MG tablet, Take 1 tablet (15 mg total) by mouth daily.   Current Outpatient Medications (Other):  Marland Kitchen  buPROPion (WELLBUTRIN XL) 300 MG 24 hr tablet, Take 1 tablet (300 mg total) by mouth daily. .  DULoxetine (CYMBALTA) 20 MG capsule, Take 2 capsules (40 mg total) by mouth daily. .  DULoxetine (CYMBALTA) 30 MG capsule, TAKE 1 CAPSULE BY MOUTH EVERY DAY .  nortriptyline (PAMELOR) 10 MG capsule, TAKE 1 CAPSULE (10 MG TOTAL) BY MOUTH AT BEDTIME. Marland Kitchen  tiZANidine (ZANAFLEX) 4 MG tablet, TAKE 1 TABLET BY MOUTH EVERY DAY IN THE EVENING .  traZODone (DESYREL) 50 MG tablet, Take 1-2 tablets (50-100 mg total) by mouth at bedtime as needed for sleep. .  traZODone (DESYREL) 50 MG tablet, TAKE 0.5-1 TABLETS (25-50 MG TOTAL) BY MOUTH AT BEDTIME AS NEEDED FOR SLEEP. .  Vitamin D, Ergocalciferol, (DRISDOL) 50000 units CAPS capsule, Take 1 capsule (50,000 Units total) by mouth every 7 (seven) days. Marland Kitchen  zolpidem (AMBIEN) 10 MG tablet, Take 1 tablet (10 mg total) by mouth at bedtime as needed. Marland Kitchen  amphetamine-dextroamphetamine (ADDERALL) 15 MG tablet, Take 1 tablet by mouth 2 (two) times daily.   Reviewed prior external information including notes and imaging from  primary care provider As well as notes that were available from care everywhere and other healthcare systems.  Past medical history, social, surgical and family history all reviewed in electronic medical record.  No pertanent information unless stated regarding to the chief complaint.   Review of Systems:  No headache, visual changes, nausea, vomiting, diarrhea, constipation, dizziness, abdominal pain, skin rash, fevers, chills, night sweats, weight loss, swollen lymph nodes, body aches, joint swelling, chest pain, shortness of breath, mood changes. POSITIVE muscle aches  Objective  Blood  pressure (!) 140/92, pulse (!) 101, height 6' (1.829 m), weight 264 lb (119.7 kg), SpO2 98 %.   General: No apparent distress alert and oriented x3 mood and affect normal, dressed appropriately.  HEENT: Pupils equal, extraocular movements intact  Respiratory: Patient's speak in full sentences and does not appear short of breath  Cardiovascular: No lower extremity edema, non tender, no erythema  Neuro: Cranial nerves II through XII are intact, neurovascularly intact in all extremities with 2+ DTRs and 2+ pulses.  Gait normal with good balance and coordination.  MSK:  Non tender with full range of motion and good stability and symmetric strength and tone of shoulders, elbows, wrist, hip, knee and ankles bilaterally.  Back exam does have some mild loss of lordosis.  Tender to palpation of the paraspinal  musculature of the lumbar spine more in the thoracolumbar juncture than the lumbosacral area.  Diffuse though really overall.  Tightness with Pearlean Brownie test bilaterally.  Negative straight leg test.  Osteopathic findings C2 flexed rotated and side bent right C6 flexed rotated and side bent left T3 extended rotated and side bent right inhaled third rib T9 extended rotated and side bent left L4 flexed rotated and side bent left Sacrum right on right    Impression and Recommendations:     This case required medical decision making of moderate complexity. The above documentation has been reviewed and is accurate and complete [name], DO       Note: This dictation was prepared with Dragon dictation along with smaller phrase technology. Any transcriptional errors that result from this process are unintentional.

## 2019-05-27 ENCOUNTER — Other Ambulatory Visit: Payer: Self-pay | Admitting: Family Medicine

## 2019-05-28 ENCOUNTER — Other Ambulatory Visit: Payer: Self-pay | Admitting: Family Medicine

## 2019-06-13 ENCOUNTER — Encounter: Payer: Self-pay | Admitting: Family Medicine

## 2019-06-13 ENCOUNTER — Other Ambulatory Visit: Payer: Self-pay

## 2019-06-13 ENCOUNTER — Ambulatory Visit (INDEPENDENT_AMBULATORY_CARE_PROVIDER_SITE_OTHER): Payer: 59 | Admitting: Family Medicine

## 2019-06-13 VITALS — BP 120/80 | HR 88 | Ht 72.0 in | Wt 273.0 lb

## 2019-06-13 DIAGNOSIS — M4200 Juvenile osteochondrosis of spine, site unspecified: Secondary | ICD-10-CM

## 2019-06-13 DIAGNOSIS — M999 Biomechanical lesion, unspecified: Secondary | ICD-10-CM

## 2019-06-13 NOTE — Progress Notes (Signed)
[name] [address] [place] [address] Phone: [phone] Subjective:   Frank [name], am serving as a scribe for Dr. Antoine Primas. This visit occurred during the SARS-CoV-2 public health emergency.  Safety protocols were in place, including screening questions prior to the visit, additional usage of staff PPE, and extensive cleaning of exam room while observing appropriate contact time as indicated for disinfecting solutions.    This visit occurred during the SARS-CoV-2 public health emergency.  Safety protocols were in place, including screening questions prior to the visit, additional usage of staff PPE, and extensive cleaning of exam room while observing appropriate contact time as indicated for disinfecting solutions.   I'm seeing this patient by the request  of:  Patient, [name] Per  CC: Back pain follow-up  [id number]  Frank [name] is a 29 y.o. male coming in with complaint of back pain. Last seen on 05/14/2019 for OMT. Patient states that he has pain in between scapula. Pain is pulling on cervical spine and into the middle of the back. Is back in the gym now the he is fully vaccinated.  Patient is also having some difficulty because he did help move his grandparents who had lived in a house for 61 years.  Did do a significant more lifting than usual.     Past Medical History:  Diagnosis Date  . ADD (attention deficit disorder)   . Anxiety   . Narcolepsy through school   Past Surgical History:  Procedure Laterality Date  . KNEE SURGERY    . WISDOM TOOTH EXTRACTION  2013   Social History   Socioeconomic History  . Marital status: Single    Spouse name: Not on file  . Number of children: 0  . Years of education: Not on file  . Highest education level: Bachelor's degree (e.g., BA, AB, BS)  Occupational History    Employer: [employer]  Tobacco Use  . Smoking status: Never Smoker  . Smokeless tobacco: Never Used  Substance  and Sexual Activity  . Alcohol use: Yes    Alcohol/week: 12.0 standard drinks    Types: 5 Shots of liquor, 7 Standard drinks or equivalent per week    Comment: Mostly just weekends  . Drug use: No  . Sexual activity: Yes    Birth control/protection: Condom  Other Topics Concern  . Not on file  Social History Narrative   Caffeine 4-5 cups a day. Decreasing adderall has increased need for coffee.   Moved back in with his parents from [place], [place] in 02/2017 to address his chronic pain and disability more aggressively (leave of absence from work).   Social Determinants of Health   Financial Resource Strain:   . Difficulty of Paying Living Expenses:   Food Insecurity:   . Worried About Programme researcher, broadcasting/film/video in the Last Year:   . Barista in the Last Year:   Transportation Needs:   . Freight forwarder (Medical):   Marland Kitchen Lack of Transportation (Non-Medical):   Physical Activity:   . Days of Exercise per Week:   . Minutes of Exercise per Session:   Stress:   . Feeling of Stress :   Social Connections:   . Frequency of Communication with Friends and Family:   . Frequency of Social Gatherings with Friends and Family:   . Attends Religious Services:   . Active Member of Clubs or Organizations:   . Attends Banker Meetings:   .  Marital Status:    No Known Allergies Family History  Problem Relation Age of Onset  . Stroke Maternal Grandmother   . Cancer Paternal Grandmother        Breast  . Hypertension Paternal Grandmother   . Diabetes Paternal Grandfather   . Prostate cancer Father        and grandfather  . Other Neg Hx     Current Outpatient Medications (Endocrine & Metabolic):  .  predniSONE (DELTASONE) 50 MG tablet, Take 1 tablet (50 mg total) by mouth daily.   Current Outpatient Medications (Respiratory):  .  albuterol (PROVENTIL HFA;VENTOLIN HFA) 108 (90 Base) MCG/ACT inhaler, Inhale 2 puffs into the lungs every 4 (four) hours as needed for wheezing  or shortness of breath (cough, shortness of breath or wheezing.).  Current Outpatient Medications (Analgesics):  .  diclofenac (VOLTAREN) 75 MG EC tablet, Take 1 tablet (75 mg total) by mouth 2 (two) times daily. .  meloxicam (MOBIC) 15 MG tablet, Take 1 tablet (15 mg total) by mouth daily.   Current Outpatient Medications (Other):  Marland Kitchen  buPROPion (WELLBUTRIN XL) 300 MG 24 hr tablet, Take 1 tablet (300 mg total) by mouth daily. .  DULoxetine (CYMBALTA) 20 MG capsule, TAKE 1 CAPSULE BY MOUTH EVERY DAY .  DULoxetine (CYMBALTA) 30 MG capsule, TAKE 1 CAPSULE BY MOUTH EVERY DAY .  nortriptyline (PAMELOR) 10 MG capsule, TAKE 1 CAPSULE (10 MG TOTAL) BY MOUTH AT BEDTIME. Marland Kitchen  tiZANidine (ZANAFLEX) 4 MG tablet, TAKE 1 TABLET BY MOUTH EVERY DAY IN THE EVENING .  traZODone (DESYREL) 50 MG tablet, Take 1-2 tablets (50-100 mg total) by mouth at bedtime as needed for sleep. .  traZODone (DESYREL) 50 MG tablet, TAKE 0.5-1 TABLETS (25-50 MG TOTAL) BY MOUTH AT BEDTIME AS NEEDED FOR SLEEP. .  Vitamin D, Ergocalciferol, (DRISDOL) 50000 units CAPS capsule, Take 1 capsule (50,000 Units total) by mouth every 7 (seven) days. Marland Kitchen  zolpidem (AMBIEN) 10 MG tablet, Take 1 tablet (10 mg total) by mouth at bedtime as needed. Marland Kitchen  amphetamine-dextroamphetamine (ADDERALL) 15 MG tablet, Take 1 tablet by mouth 2 (two) times daily.   Reviewed prior external information including notes and imaging from  primary care provider As well as notes that were available from care everywhere and other healthcare systems.  Past medical history, social, surgical and family history all reviewed in electronic medical record.  No pertanent information unless stated regarding to the chief complaint.   Review of Systems:  No headache, visual changes, nausea, vomiting, diarrhea, constipation, dizziness, abdominal pain, skin rash, fevers, chills, night sweats, weight loss, swollen lymph nodes, , joint swelling, chest pain, shortness of breath, mood  changes. POSITIVE muscle aches, body aches  Objective  Blood pressure 120/80, pulse 88, height 6' (1.829 m), weight 273 lb (123.8 kg), SpO2 98 %.   General: No apparent distress alert and oriented x3 mood and affect normal, dressed appropriately.  HEENT: Pupils equal, extraocular movements intact  Respiratory: Patient's speak in full sentences and does not appear short of breath  Cardiovascular: No lower extremity edema, non tender, no erythema  Neuro: Cranial nerves II through XII are intact, neurovascularly intact in all extremities with 2+ DTRs and 2+ pulses.  Gait normal with good balance and coordination.  MSK:  Non tender with full range of motion and good stability and symmetric strength and tone of shoulders, elbows, wrist, hip, knee and ankles bilaterally.  Low back exam still shows significant tightness in the paraspinal musculature in the  parascapular region.  Patient does have some tender to palpation diffusely.  No spinous process tenderness.  Patient has some pain in the thoracolumbar juncture bilaterally as well.  Osteopathic findings  C2 flexed rotated and side bent right C6 flexed rotated and side bent left T3 extended rotated and side bent right inhaled third rib T9 extended rotated and side bent left L2 flexed rotated and side bent right Sacrum right on right    Impression and Recommendations:     This case required medical decision making of moderate complexity. The above documentation has been reviewed and is accurate and complete Lyndal Pulley, DO       Note: This dictation was prepared with Dragon dictation along with smaller phrase technology. Any transcriptional errors that result from this process are unintentional.

## 2019-06-13 NOTE — Assessment & Plan Note (Signed)

## 2019-06-13 NOTE — Assessment & Plan Note (Signed)
Chronic problem with mild exacerbation.  Continuing to take the Cymbalta at this point, Zanaflex fairly regularly as well.  Patient did do some new activity that likely contributed to some of the aches and pains and still having some fatigue of the musculature.  Discussed icing regimen and home exercises.  Patient will continue to work with a Systems analyst and formal physical therapist from time to time as well.  Follow-up with me again 4 to 8 weeks.

## 2019-06-13 NOTE — Patient Instructions (Signed)
Arnica lotion for heel See me in 4-5 weeks

## 2019-06-25 ENCOUNTER — Other Ambulatory Visit: Payer: Self-pay | Admitting: Family Medicine

## 2019-06-30 ENCOUNTER — Ambulatory Visit (INDEPENDENT_AMBULATORY_CARE_PROVIDER_SITE_OTHER): Payer: 59 | Admitting: Family Medicine

## 2019-06-30 ENCOUNTER — Encounter: Payer: Self-pay | Admitting: Family Medicine

## 2019-06-30 ENCOUNTER — Other Ambulatory Visit: Payer: Self-pay

## 2019-06-30 VITALS — BP 110/82 | HR 89 | Ht 72.0 in | Wt 278.0 lb

## 2019-06-30 DIAGNOSIS — M999 Biomechanical lesion, unspecified: Secondary | ICD-10-CM

## 2019-06-30 DIAGNOSIS — M25512 Pain in left shoulder: Secondary | ICD-10-CM

## 2019-06-30 NOTE — Progress Notes (Signed)
Frank [name] Subjective:    I'm seeing this patient by the request  of:  Patient, [name] Per  CC: Upper back pain follow-up  [id number]   Frank [name] is a 29 y.o. male coming in with complaint of back pain. Last seen on 06/13/2019 for OMT. Patient states that he continues to have tightness on left scapula that radiates into neck and left arm. Pain has improved since last visit. Has had dry needling which has helped his pain.  Still states that he can get severe pain that stops him from activities on a regular basis.  Patient has been doing dry needling and working with his trainer in the gym again which he thinks is helpful     Past Medical History:  Diagnosis Date  . ADD (attention deficit disorder)   . Anxiety   . Narcolepsy through school   Past Surgical History:  Procedure Laterality Date  . KNEE SURGERY    . WISDOM TOOTH EXTRACTION  2013   Social History   Socioeconomic History  . Marital status: Single    Spouse name: Not on file  . Number of children: 0  . Years of education: Not on file  . Highest education level: Bachelor's degree (e.g., BA, AB, BS)  Occupational History    Employer: [employer]  Tobacco Use  . Smoking status: Never Smoker  . Smokeless tobacco: Never Used  Substance and Sexual Activity  . Alcohol use: Yes    Alcohol/week: 12.0 standard drinks    Types: 5 Shots of liquor, 7 Standard drinks or equivalent per week    Comment: Mostly just weekends  . Drug use: No  . Sexual activity: Yes    Birth control/protection: Condom  Other Topics Concern  . Not on file  Social History Narrative   Caffeine 4-5 cups a day. Decreasing adderall has increased need for coffee.   Moved back in with his parents from [place], [place] in 02/2017 to address his chronic pain and disability more aggressively (leave of absence from work).   Social Determinants of Health    Financial Resource Strain:   . Difficulty of Paying Living Expenses:   Food Insecurity:   . Worried About Charity fundraiser in the Last Year:   . Arboriculturist in the Last Year:   Transportation Needs:   . Film/video editor (Medical):   Marland Kitchen Lack of Transportation (Non-Medical):   Physical Activity:   . Days of Exercise per Week:   . Minutes of Exercise per Session:   Stress:   . Feeling of Stress :   Social Connections:   . Frequency of Communication with Friends and Family:   . Frequency of Social Gatherings with Friends and Family:   . Attends Religious Services:   . Active Member of Clubs or Organizations:   . Attends Archivist Meetings:   Marland Kitchen Marital Status:    No Known Allergies Family History  Problem Relation Age of Onset  . Stroke Maternal Grandmother   . Cancer Paternal Grandmother        Breast  . Hypertension Paternal Grandmother   . Diabetes Paternal Grandfather   . Prostate cancer Father        and grandfather  . Other Neg Hx     Current Outpatient Medications (Endocrine & Metabolic):  .  predniSONE (DELTASONE) 50 MG tablet, Take 1 tablet (50 mg total) by  mouth daily.   Current Outpatient Medications (Respiratory):  .  albuterol (PROVENTIL HFA;VENTOLIN HFA) 108 (90 Base) MCG/ACT inhaler, Inhale 2 puffs into the lungs every 4 (four) hours as needed for wheezing or shortness of breath (cough, shortness of breath or wheezing.).  Current Outpatient Medications (Analgesics):  .  diclofenac (VOLTAREN) 75 MG EC tablet, Take 1 tablet (75 mg total) by mouth 2 (two) times daily. .  meloxicam (MOBIC) 15 MG tablet, Take 1 tablet (15 mg total) by mouth daily.   Current Outpatient Medications (Other):  Marland Kitchen  buPROPion (WELLBUTRIN XL) 300 MG 24 hr tablet, Take 1 tablet (300 mg total) by mouth daily. .  DULoxetine (CYMBALTA) 20 MG capsule, TAKE 1 CAPSULE BY MOUTH EVERY DAY .  DULoxetine (CYMBALTA) 30 MG capsule, TAKE 1 CAPSULE BY MOUTH EVERY DAY .   nortriptyline (PAMELOR) 10 MG capsule, TAKE 1 CAPSULE (10 MG TOTAL) BY MOUTH AT BEDTIME. Marland Kitchen  tiZANidine (ZANAFLEX) 4 MG tablet, TAKE 1 TABLET BY MOUTH EVERY DAY IN THE EVENING .  traZODone (DESYREL) 50 MG tablet, Take 1-2 tablets (50-100 mg total) by mouth at bedtime as needed for sleep. .  traZODone (DESYREL) 50 MG tablet, TAKE 0.5-1 TABLETS (25-50 MG TOTAL) BY MOUTH AT BEDTIME AS NEEDED FOR SLEEP. .  Vitamin D, Ergocalciferol, (DRISDOL) 50000 units CAPS capsule, Take 1 capsule (50,000 Units total) by mouth every 7 (seven) days. Marland Kitchen  zolpidem (AMBIEN) 10 MG tablet, Take 1 tablet (10 mg total) by mouth at bedtime as needed. Marland Kitchen  amphetamine-dextroamphetamine (ADDERALL) 15 MG tablet, Take 1 tablet by mouth 2 (two) times daily.   Reviewed prior external information including notes and imaging from  primary care provider As well as notes that were available from care everywhere and other healthcare systems.  Past medical history, social, surgical and family history all reviewed in electronic medical record.  No pertanent information unless stated regarding to the chief complaint.   Review of Systems:  No headache, visual changes, nausea, vomiting, diarrhea, constipation, dizziness, abdominal pain, skin rash, fevers, chills, night sweats, weight loss, swollen lymph nodes, body aches, joint swelling, chest pain, shortness of breath, mood changes. POSITIVE muscle aches  Objective  Blood pressure 110/82, pulse 89, height 6' (1.829 m), weight 278 lb (126.1 kg), SpO2 98 %.   General: No apparent distress alert and oriented x3 mood and affect normal, dressed appropriately.  HEENT: Pupils equal, extraocular movements intact  Respiratory: Patient's speak in full sentences and does not appear short of breath  Cardiovascular: No lower extremity edema, non tender, no erythema  Neuro: Cranial nerves II through XII are intact, neurovascularly intact in all extremities with 2+ DTRs and 2+ pulses.  Gait normal  with good balance and coordination.  MSK: Upper back exam shows the patient does have multiple trigger points noted in the left parascapular region.  This seems to be in the rhomboid, trapezius and levator scapula.  Patient does have tightness in the neck on the left side.  Negative Spurling's but pain with any type of range of motion.  Tenderness in the thoracolumbar juncture still noted as wellt   Osteopathic findings   C6 flexed rotated and side bent left T3 extended rotated and side bent left inhaled third rib T11 extended rotated and side bent left L2 flexed rotated and side bent right Sacrum right on right  After verbal consent patient was prepped with alcohol swabs and with a 25-gauge half inch needle injected into 3 distinct trigger points 1 in the rhomboid,  1 in the trapezius and 1 in the levator scapula with a total of 3 cc of 0.5% Marcaine and 1 cc of Kenalog 40 mg/mL.  No blood loss.  Postinjection instructions given   Impression and Recommendations:     This case required medical decision making of moderate complexity. The above documentation has been reviewed and is accurate and complete [name], DO       Note: This dictation was prepared with Dragon dictation along with smaller phrase technology. Any transcriptional errors that result from this process are unintentional.

## 2019-06-30 NOTE — Assessment & Plan Note (Signed)
Chronic problem with exacerbation.  Trigger points given today.  Has been nearly 2 years since we have done this in this individual.  Discussed the Cymbalta 30 mg.  Discussed the muscle relaxer.  Follow-up with me again in 4 to 8 weeks

## 2019-06-30 NOTE — Assessment & Plan Note (Signed)

## 2019-06-30 NOTE — Patient Instructions (Signed)
Keep it going  Tried trigger points 4-6 weeks

## 2019-07-26 ENCOUNTER — Other Ambulatory Visit: Payer: Self-pay | Admitting: Family Medicine

## 2019-07-28 ENCOUNTER — Ambulatory Visit (INDEPENDENT_AMBULATORY_CARE_PROVIDER_SITE_OTHER): Payer: 59 | Admitting: Family Medicine

## 2019-07-28 ENCOUNTER — Encounter: Payer: Self-pay | Admitting: Family Medicine

## 2019-07-28 ENCOUNTER — Other Ambulatory Visit: Payer: Self-pay

## 2019-07-28 VITALS — BP 130/90 | HR 83 | Ht 72.0 in | Wt 273.0 lb

## 2019-07-28 DIAGNOSIS — M999 Biomechanical lesion, unspecified: Secondary | ICD-10-CM | POA: Diagnosis not present

## 2019-07-28 DIAGNOSIS — M4200 Juvenile osteochondrosis of spine, site unspecified: Secondary | ICD-10-CM | POA: Diagnosis not present

## 2019-07-28 NOTE — Assessment & Plan Note (Signed)

## 2019-07-28 NOTE — Assessment & Plan Note (Signed)
Mild exacerbation of the chronic problem.  Still on the Cymbalta.  May need to consider possibly increasing but we will not at this time.  Patient encouraged to continue to attempt to lose weight.  Stay active.  Muscle relaxers.  See patient again in 2 to 4 weeks.

## 2019-07-28 NOTE — Progress Notes (Signed)
[name] [address] [place] [address] Phone: [phone] Subjective:   I Frank [name] am serving as a Neurosurgeon for Dr. Antoine [name].  This visit occurred during the SARS-CoV-2 public health emergency.  Safety protocols were in place, including screening questions prior to the visit, additional usage of staff PPE, and extensive cleaning of exam room while observing appropriate contact time as indicated for disinfecting solutions.   I'm seeing this patient by the request  of:  Patient, [name] Per  CC: Low back pain follow-up  [id number]  Frank [name] is a 29 y.o. male coming in with complaint of back pain. Last seen 06/30/2019 for OMT. Patient states he is sore. Has been going to PT and exercising this week. Relaxation has been uncomfortable lately.  Patient has been more in the parascapular region.  Patient states that is more left greater than right.  Trying to stay active but finding it difficult.    Past Medical History:  Diagnosis Date  . ADD (attention deficit disorder)   . Anxiety   . Narcolepsy through school   Past Surgical History:  Procedure Laterality Date  . KNEE SURGERY    . WISDOM TOOTH EXTRACTION  2013   Social History   Socioeconomic History  . Marital status: Single    Spouse name: Not on file  . Number of children: 0  . Years of education: Not on file  . Highest education level: Bachelor's degree (e.g., BA, AB, BS)  Occupational History    Employer: [employer]  Tobacco Use  . Smoking status: Never Smoker  . Smokeless tobacco: Never Used  Substance and Sexual Activity  . Alcohol use: Yes    Alcohol/week: 12.0 standard drinks    Types: 5 Shots of liquor, 7 Standard drinks or equivalent per week    Comment: Mostly just weekends  . Drug use: No  . Sexual activity: Yes    Birth control/protection: Condom  Other Topics Concern  . Not on file  Social History Narrative   Caffeine 4-5 cups a day. Decreasing  adderall has increased need for coffee.   Moved back in with his parents from [place], [place] in 02/2017 to address his chronic pain and disability more aggressively (leave of absence from work).   Social Determinants of Health   Financial Resource Strain:   . Difficulty of Paying Living Expenses:   Food Insecurity:   . Worried About Programme researcher, broadcasting/film/video in the Last Year:   . Barista in the Last Year:   Transportation Needs:   . Freight forwarder (Medical):   Marland Kitchen Lack of Transportation (Non-Medical):   Physical Activity:   . Days of Exercise per Week:   . Minutes of Exercise per Session:   Stress:   . Feeling of Stress :   Social Connections:   . Frequency of Communication with Friends and Family:   . Frequency of Social Gatherings with Friends and Family:   . Attends Religious Services:   . Active Member of Clubs or Organizations:   . Attends Banker Meetings:   Marland Kitchen Marital Status:    No Known Allergies Family History  Problem Relation Age of Onset  . Stroke Maternal Grandmother   . Cancer Paternal Grandmother        Breast  . Hypertension Paternal Grandmother   . Diabetes Paternal Grandfather   . Prostate cancer Father        and grandfather  .  Other Neg Hx     Current Outpatient Medications (Endocrine & Metabolic):  .  predniSONE (DELTASONE) 50 MG tablet, Take 1 tablet (50 mg total) by mouth daily.   Current Outpatient Medications (Respiratory):  .  albuterol (PROVENTIL HFA;VENTOLIN HFA) 108 (90 Base) MCG/ACT inhaler, Inhale 2 puffs into the lungs every 4 (four) hours as needed for wheezing or shortness of breath (cough, shortness of breath or wheezing.).  Current Outpatient Medications (Analgesics):  .  diclofenac (VOLTAREN) 75 MG EC tablet, Take 1 tablet (75 mg total) by mouth 2 (two) times daily. .  meloxicam (MOBIC) 15 MG tablet, Take 1 tablet (15 mg total) by mouth daily.   Current Outpatient Medications (Other):  Marland Kitchen  buPROPion (WELLBUTRIN  XL) 300 MG 24 hr tablet, Take 1 tablet (300 mg total) by mouth daily. .  DULoxetine (CYMBALTA) 20 MG capsule, TAKE 1 CAPSULE BY MOUTH EVERY DAY .  DULoxetine (CYMBALTA) 30 MG capsule, TAKE 1 CAPSULE BY MOUTH EVERY DAY .  nortriptyline (PAMELOR) 10 MG capsule, TAKE 1 CAPSULE (10 MG TOTAL) BY MOUTH AT BEDTIME. Marland Kitchen  tiZANidine (ZANAFLEX) 4 MG tablet, TAKE 1 TABLET BY MOUTH EVERY DAY IN THE EVENING .  traZODone (DESYREL) 50 MG tablet, Take 1-2 tablets (50-100 mg total) by mouth at bedtime as needed for sleep. .  traZODone (DESYREL) 50 MG tablet, TAKE 0.5-1 TABLETS (25-50 MG TOTAL) BY MOUTH AT BEDTIME AS NEEDED FOR SLEEP. .  Vitamin D, Ergocalciferol, (DRISDOL) 50000 units CAPS capsule, Take 1 capsule (50,000 Units total) by mouth every 7 (seven) days. Marland Kitchen  zolpidem (AMBIEN) 10 MG tablet, Take 1 tablet (10 mg total) by mouth at bedtime as needed. Marland Kitchen  amphetamine-dextroamphetamine (ADDERALL) 15 MG tablet, Take 1 tablet by mouth 2 (two) times daily.   Reviewed prior external information including notes and imaging from  primary care provider As well as notes that were available from care everywhere and other healthcare systems.  Past medical history, social, surgical and family history all reviewed in electronic medical record.  No pertanent information unless stated regarding to the chief complaint.   Review of Systems:  No headache, visual changes, nausea, vomiting, diarrhea, constipation, dizziness, abdominal pain, skin rash, fevers, chills, night sweats, weight loss, swollen lymph nodes, body aches, joint swelling, chest pain, shortness of breath, mood changes. POSITIVE muscle aches  Objective  Blood pressure 130/90, pulse 83, height 6' (1.829 m), weight 273 lb (123.8 kg), SpO2 98 %.   General: No apparent distress alert and oriented x3 mood and affect normal, dressed appropriately.  HEENT: Pupils equal, extraocular movements intact  Respiratory: Patient's speak in full sentences and does not  appear short of breath  Cardiovascular: No lower extremity edema, non tender, no erythema  Neuro: Cranial nerves II through XII are intact, neurovascularly intact in all extremities with 2+ DTRs and 2+ pulses.  Gait normal with good balance and coordination.  MSK:  Non tender with full range of motion and good stability and symmetric strength and tone of shoulders, elbows, wrist, hip, knee and ankles bilaterally.  Low back exam shows the patient does have some mild loss of lordosis.  Tenderness to palpation of the paraspinal musculature in the thoracolumbar juncture left greater than right.  5-5 strength over the lower extremities    Osteopathic findings   C6 flexed rotated and side bent left T3 extended rotated and side bent right inhaled third rib T11 extended rotated and side bent left L2 flexed rotated and side bent right Sacrum right on  right  Impression and Recommendations:     The above documentation has been reviewed and is accurate and complete [name], DO       Note: This dictation was prepared with Dragon dictation along with smaller phrase technology. Any transcriptional errors that result from this process are unintentional.

## 2019-07-28 NOTE — Patient Instructions (Addendum)
Good to see you See me at 12:30 on June 24th

## 2019-07-30 ENCOUNTER — Other Ambulatory Visit: Payer: Self-pay | Admitting: Family Medicine

## 2019-08-07 ENCOUNTER — Encounter: Payer: Self-pay | Admitting: Family Medicine

## 2019-08-07 ENCOUNTER — Ambulatory Visit (INDEPENDENT_AMBULATORY_CARE_PROVIDER_SITE_OTHER): Payer: 59 | Admitting: Family Medicine

## 2019-08-07 ENCOUNTER — Other Ambulatory Visit: Payer: Self-pay

## 2019-08-07 VITALS — BP 124/82 | HR 95 | Ht 72.0 in | Wt 272.0 lb

## 2019-08-07 DIAGNOSIS — M7918 Myalgia, other site: Secondary | ICD-10-CM | POA: Diagnosis not present

## 2019-08-07 DIAGNOSIS — M999 Biomechanical lesion, unspecified: Secondary | ICD-10-CM

## 2019-08-07 NOTE — Assessment & Plan Note (Signed)

## 2019-08-07 NOTE — Assessment & Plan Note (Addendum)
Mild exacerbation of the fall.  Nothing specific.  Likely should do well with conservative therapy.  Discussed icing regimen and home exercise, which activities to do which was to avoid.  Increase activity slowly.  Follow-up again in 4 to 8 weeks.  Discussed medications including the meloxicam and Zanaflex

## 2019-08-07 NOTE — Progress Notes (Signed)
[name] [address] [place] [address] Phone: [phone] Subjective:   I Frank [name] am serving as a Neurosurgeon for Dr. Antoine Primas.  This visit occurred during the SARS-CoV-2 public health emergency.  Safety protocols were in place, including screening questions prior to the visit, additional usage of staff PPE, and extensive cleaning of exam room while observing appropriate contact time as indicated for disinfecting solutions.   I'm seeing this patient by the request  of:  Patient, [name] Per  CC: Back pain follow-up  [id number]  [name] is a 29 y.o. male coming in with complaint of back pain. States he slipped on the stairs and messed up his left hip. Had some numbness and tingling. States he felt a pop and then pain. Doing better today.  Patient states some mild tightness overall.  States that the sacroiliac joint is giving him some difficulty and is causing him to change how he is sitting.  Is going to be going to physical therapy for dry needling in the near future.      Past Medical History:  Diagnosis Date   ADD (attention deficit disorder)    Anxiety    Narcolepsy through school   Past Surgical History:  Procedure Laterality Date   KNEE SURGERY     WISDOM TOOTH EXTRACTION  2013   Social History   Socioeconomic History   Marital status: Single    Spouse name: Not on file   Number of children: 0   Years of education: Not on file   Highest education level: Bachelor's degree (e.g., BA, AB, BS)  Occupational History    Employer: [employer]  Tobacco Use   Smoking status: Never Smoker   Smokeless tobacco: Never Used  Substance and Sexual Activity   Alcohol use: Yes    Alcohol/week: 12.0 standard drinks    Types: 5 Shots of liquor, 7 Standard drinks or equivalent per week    Comment: Mostly just weekends   Drug use: No   Sexual activity: Yes    Birth control/protection: Condom  Other Topics Concern    Not on file  Social History Narrative   Caffeine 4-5 cups a day. Decreasing adderall has increased need for coffee.   Moved back in with his parents from [place], [place] in 02/2017 to address his chronic pain and disability more aggressively (leave of absence from work).   Social Determinants of Health   Financial Resource Strain:    Difficulty of Paying Living Expenses:   Food Insecurity:    Worried About Programme researcher, broadcasting/film/video in the Last Year:    Barista in the Last Year:   Transportation Needs:    Freight forwarder (Medical):    Lack of Transportation (Non-Medical):   Physical Activity:    Days of Exercise per Week:    Minutes of Exercise per Session:   Stress:    Feeling of Stress :   Social Connections:    Frequency of Communication with Friends and Family:    Frequency of Social Gatherings with Friends and Family:    Attends Religious Services:    Active Member of Clubs or Organizations:    Attends Engineer, structural:    Marital Status:    No Known Allergies Family History  Problem Relation Age of Onset   Stroke Maternal Grandmother    Cancer Paternal Grandmother        Breast   Hypertension Paternal Grandmother  Diabetes Paternal Grandfather    Prostate cancer Father        and grandfather   Other Neg Hx     Current Outpatient Medications (Endocrine & Metabolic):    predniSONE (DELTASONE) 50 MG tablet, Take 1 tablet (50 mg total) by mouth daily.   Current Outpatient Medications (Respiratory):    albuterol (PROVENTIL HFA;VENTOLIN HFA) 108 (90 Base) MCG/ACT inhaler, Inhale 2 puffs into the lungs every 4 (four) hours as needed for wheezing or shortness of breath (cough, shortness of breath or wheezing.).  Current Outpatient Medications (Analgesics):    diclofenac (VOLTAREN) 75 MG EC tablet, Take 1 tablet (75 mg total) by mouth 2 (two) times daily.   meloxicam (MOBIC) 15 MG tablet, Take 1 tablet (15 mg total) by mouth  daily.   Current Outpatient Medications (Other):    buPROPion (WELLBUTRIN XL) 300 MG 24 hr tablet, Take 1 tablet (300 mg total) by mouth daily.   DULoxetine (CYMBALTA) 20 MG capsule, TAKE 1 CAPSULE BY MOUTH EVERY DAY   DULoxetine (CYMBALTA) 30 MG capsule, TAKE 1 CAPSULE BY MOUTH EVERY DAY   nortriptyline (PAMELOR) 10 MG capsule, TAKE 1 CAPSULE (10 MG TOTAL) BY MOUTH AT BEDTIME.   tiZANidine (ZANAFLEX) 4 MG tablet, TAKE 1 TABLET BY MOUTH EVERY DAY IN THE EVENING   traZODone (DESYREL) 50 MG tablet, Take 1-2 tablets (50-100 mg total) by mouth at bedtime as needed for sleep.   traZODone (DESYREL) 50 MG tablet, TAKE 0.5-1 TABLETS (25-50 MG TOTAL) BY MOUTH AT BEDTIME AS NEEDED FOR SLEEP.   Vitamin D, Ergocalciferol, (DRISDOL) 50000 units CAPS capsule, Take 1 capsule (50,000 Units total) by mouth every 7 (seven) days.   zolpidem (AMBIEN) 10 MG tablet, Take 1 tablet (10 mg total) by mouth at bedtime as needed.   amphetamine-dextroamphetamine (ADDERALL) 15 MG tablet, Take 1 tablet by mouth 2 (two) times daily.   Reviewed prior external information including notes and imaging from  primary care provider As well as notes that were available from care everywhere and other healthcare systems.  Past medical history, social, surgical and family history all reviewed in electronic medical record.  No pertanent information unless stated regarding to the chief complaint.   Review of Systems:  No headache, visual changes, nausea, vomiting, diarrhea, constipation, dizziness, abdominal pain, skin rash, fevers, chills, night sweats, weight loss, swollen lymph nodes, body aches, joint swelling, chest pain, shortness of breath, mood changes. POSITIVE muscle aches  Objective  Blood pressure 124/82, pulse 95, height 6' (1.829 m), weight 272 lb (123.4 kg), SpO2 98 %.   General: No apparent distress alert and oriented x3 mood and affect normal, dressed appropriately.  HEENT: Pupils equal, extraocular  movements intact  Respiratory: Patient's speak in full sentences and does not appear short of breath  Cardiovascular: No lower extremity edema, non tender, no erythema  Neuro: Cranial nerves II through XII are intact, neurovascularly intact in all extremities with 2+ DTRs and 2+ pulses.  Gait normal with good balance and coordination.  MSK:  Non tender with full range of motion and good stability and symmetric strength and tone of shoulders, elbows, wrist, hip, knee and ankles bilaterally.  Back exam still has significant tightness noted in the thoracolumbar and lumbosacral areas. Negative straight leg test pain tender to palpation noted diffusely.  Osteopathic findings  C2 flexed rotated and side bent right C6 flexed rotated and side bent left T3 extended rotated and side bent right inhaled third rib T8 extended rotated and side bent  left T11 extended rotated and side bent left L2 flexed rotated and side bent right Sacrum right on right    Impression and Recommendations:     The above documentation has been reviewed and is accurate and complete [name], DO       Note: This dictation was prepared with Dragon dictation along with smaller phrase technology. Any transcriptional errors that result from this process are unintentional.

## 2019-08-26 ENCOUNTER — Other Ambulatory Visit: Payer: Self-pay | Admitting: Family Medicine

## 2019-08-27 ENCOUNTER — Ambulatory Visit (INDEPENDENT_AMBULATORY_CARE_PROVIDER_SITE_OTHER): Payer: 59 | Admitting: Family Medicine

## 2019-08-27 ENCOUNTER — Encounter: Payer: Self-pay | Admitting: Family Medicine

## 2019-08-27 ENCOUNTER — Other Ambulatory Visit: Payer: Self-pay

## 2019-08-27 VITALS — BP 136/90 | HR 92 | Ht 72.0 in | Wt 279.0 lb

## 2019-08-27 DIAGNOSIS — M5134 Other intervertebral disc degeneration, thoracic region: Secondary | ICD-10-CM

## 2019-08-27 DIAGNOSIS — M999 Biomechanical lesion, unspecified: Secondary | ICD-10-CM

## 2019-08-27 NOTE — Patient Instructions (Signed)
4-6 week follow up If company has Epic they can see my notes

## 2019-08-27 NOTE — Progress Notes (Signed)
[name] [address] [place] [address] Phone: [phone] Subjective:   [name], am serving as a scribe for Dr. Antoine Primas. This visit occurred during the SARS-CoV-2 public health emergency.  Safety protocols were in place, including screening questions prior to the visit, additional usage of staff PPE, and extensive cleaning of exam room while observing appropriate contact time as indicated for disinfecting solutions.   I'm seeing this patient by the request  of:  Patient, [name] Per  CC: Low back pain follow-up  [id number]  KUNAL LEVARIO is a 29 y.o. male coming in with complaint of back and neck pain. OMT 08/07/2019. Patient states that he has been doing ok. Golfed last week and felt his pelvis rotated on left side after finishing his round.  Medications patient has been prescribed: Trazodone, Zanaflex, Cymbalta  Taking: Yes        Reviewed prior external information including notes and imaging from previsou exam, outside providers and external EMR if available.   As well as notes that were available from care everywhere and other healthcare systems.  Past medical history, social, surgical and family history all reviewed in electronic medical record.  No pertanent information unless stated regarding to the chief complaint.   Past Medical History:  Diagnosis Date  . ADD (attention deficit disorder)   . Anxiety   . Narcolepsy through school    No Known Allergies   Review of Systems:  No headache, visual changes, nausea, vomiting, diarrhea, constipation, dizziness, abdominal pain, skin rash, fevers, chills, night sweats, weight loss, swollen lymph nodes, body aches, joint swelling, chest pain, shortness of breath, mood changes. POSITIVE muscle aches  Objective  Blood pressure 136/90, pulse 92, height 6' (1.829 m), weight 279 lb (126.6 kg), SpO2 98 %.   General: No apparent distress alert and oriented x3 mood and  affect normal, dressed appropriately.  HEENT: Pupils equal, extraocular movements intact  Respiratory: Patient's speak in full sentences and does not appear short of breath  Cardiovascular: No lower extremity edema, non tender, no erythema  Neuro: Cranial nerves II through XII are intact, neurovascularly intact in all extremities with 2+ DTRs and 2+ pulses.  Gait normal with good balance and coordination.  MSK:  Non tender with full range of motion and good stability and symmetric strength and tone of shoulders, elbows, wrist, hip, knee and ankles bilaterally.  Back - Normal skin, Spine with normal alignment and no deformity.  No tenderness to vertebral process palpation.  Paraspinous muscles are not tender and without spasm.   Range of motion is full at neck and lumbar sacral regions  Osteopathic findings  C2 flexed rotated and side bent right C7 flexed rotated and side bent left T3 extended rotated and side bent right inhaled rib T9 extended rotated and side bent left L2 flexed rotated and side bent right Sacrum right on right       Assessment and Plan:  Degeneration of intervertebral disc of thoracic region Chronic problem overall.  Mild exacerbation.  Patient is traveling golf may have caused it.  Discussed medication management again including Wellbutrin versus the Cymbalta.  We also discussed the meloxicam and Zanaflex.  Increasing activity slowly continuing to work with Systems analyst and physical therapist.  Follow-up again in 4 to 8 weeks    Nonallopathic problems  Decision today to treat with OMT was based on Physical Exam  After verbal consent patient was treated with HVLA, ME, FPR techniques  in cervical, rib, thoracic, lumbar, and sacral  areas  Patient tolerated the procedure well with improvement in symptoms  Patient given exercises, stretches and lifestyle modifications  See medications in patient instructions if given  Patient will follow up in 4-8 weeks       The above documentation has been reviewed and is accurate and complete [name], DO       Note: This dictation was prepared with Dragon dictation along with smaller phrase technology. Any transcriptional errors that result from this process are unintentional.

## 2019-08-28 ENCOUNTER — Encounter: Payer: Self-pay | Admitting: Family Medicine

## 2019-08-28 NOTE — Assessment & Plan Note (Signed)
Chronic problem overall.  Mild exacerbation.  Patient is traveling golf may have caused it.  Discussed medication management again including Wellbutrin versus the Cymbalta.  We also discussed the meloxicam and Zanaflex.  Increasing activity slowly continuing to work with Systems analyst and physical therapist.  Follow-up again in 4 to 8 weeks

## 2019-09-17 ENCOUNTER — Other Ambulatory Visit: Payer: Self-pay

## 2019-09-17 ENCOUNTER — Ambulatory Visit (INDEPENDENT_AMBULATORY_CARE_PROVIDER_SITE_OTHER): Payer: 59 | Admitting: Family Medicine

## 2019-09-17 ENCOUNTER — Encounter: Payer: Self-pay | Admitting: Family Medicine

## 2019-09-17 VITALS — BP 132/94 | HR 97 | Ht 72.0 in | Wt 281.0 lb

## 2019-09-17 DIAGNOSIS — M4200 Juvenile osteochondrosis of spine, site unspecified: Secondary | ICD-10-CM

## 2019-09-17 DIAGNOSIS — M999 Biomechanical lesion, unspecified: Secondary | ICD-10-CM | POA: Diagnosis not present

## 2019-09-17 MED ORDER — DULOXETINE HCL 30 MG PO CPEP: ORAL_CAPSULE | ORAL | 1 refills | Status: DC

## 2019-09-17 NOTE — Patient Instructions (Signed)
Avoid cross over movement Keep hands in peripheral vision Try pennsaid Ice is good See me again in 3-4 weeks

## 2019-09-17 NOTE — Assessment & Plan Note (Signed)
Chronic problem with mild exacerbation again.  Patient is continuing to take the medications at this point and I do feel like we will need to consider the possibility of increasing Cymbalta at the next office visit.  Patient will continue to work with his trainer as well as the physical therapist.  Discussed icing regimen and home exercises.  Patient will increase activity slowly and follow-up with me again in 4 to 8 weeks

## 2019-09-17 NOTE — Progress Notes (Signed)
[name] [address] [place] [address] Phone: [phone] Subjective:   Frank [name], am serving as a scribe for Dr. Antoine Primas. This visit occurred during the SARS-CoV-2 public health emergency.  Safety protocols were in place, including screening questions prior to the visit, additional usage of staff PPE, and extensive cleaning of exam room while observing appropriate contact time as indicated for disinfecting solutions.   I'm seeing this patient by the request  of:  Patient, [name] Per  CC: back pain   [id number]  Frank [name] is a 29 y.o. male coming in with complaint of back and neck pain. OMT 08/27/2019. Patient states that he is having tightness in his back.  Patient continues to have discomfort and pain.  States that after traveling to the beach and driving in the car a long time starting to have increasing discomfort again in the back.  States that it was severely sore.  Needed to Zanaflex and continues on the Cymbalta.           Reviewed prior external information including notes and imaging from previsou exam, outside providers and external EMR if available.   As well as notes that were available from care everywhere and other healthcare systems.  Past medical history, social, surgical and family history all reviewed in electronic medical record.  No pertanent information unless stated regarding to the chief complaint.   Past Medical History:  Diagnosis Date  . ADD (attention deficit disorder)   . Anxiety   . Narcolepsy through school    No Known Allergies   Review of Systems:  No headache, visual changes, nausea, vomiting, diarrhea, constipation, dizziness, abdominal pain, skin rash, fevers, chills, night sweats, weight loss, swollen lymph nodes, body aches, joint swelling, chest pain, shortness of breath, mood changes. POSITIVE muscle aches  Objective  There were no vitals taken for this visit.     General: No apparent distress alert and oriented x3 mood and affect normal, dressed appropriately.  HEENT: Pupils equal, extraocular movements intact  Respiratory: Patient's speak in full sentences and does not appear short of breath  Cardiovascular: No lower extremity edema, non tender, no erythema  Neuro: Cranial nerves II through XII are intact, neurovascularly intact in all extremities with 2+ DTRs and 2+ pulses.  Gait normal with good balance and coordination.  MSK:  Non tender with full range of motion and good stability and symmetric strength and tone of shoulders, elbows, wrist, hip, knee and ankles bilaterally.  Back -pain seems to be more in the thoracolumbar junction.  Patient does have some tightness of the lower back as well.  Some mild tightness with FABER test.  Negative straight leg test.  Tightness in the parascapular region.  Pain is out of proportion to the amount of palpation overall.  Osteopathic findings  C2 flexed rotated and side bent right C6 flexed rotated and side bent left T3 extended rotated and side bent right inhaled rib T9 extended rotated and side bent left L2 flexed rotated and side bent right Sacrum right on right       Assessment and Plan:  Scheuermann's disease Chronic problem with mild exacerbation again.  Patient is continuing to take the medications at this point and I do feel like we will need to consider the possibility of increasing Cymbalta at the next office visit.  Patient will continue to work with his trainer as well as the physical therapist.  Discussed icing regimen and home  exercises.  Patient will increase activity slowly and follow-up with me again in 4 to 8 weeks    Nonallopathic problems  Decision today to treat with OMT was based on Physical Exam  After verbal consent patient was treated with HVLA, ME, FPR techniques in cervical, rib, thoracic, lumbar, and sacral  areas  Patient tolerated the procedure well with improvement  in symptoms  Patient given exercises, stretches and lifestyle modifications  See medications in patient instructions if given  Patient will follow up in 4-8 weeks      The above documentation has been reviewed and is accurate and complete [name], DO       Note: This dictation was prepared with Dragon dictation along with smaller phrase technology. Any transcriptional errors that result from this process are unintentional.

## 2019-09-24 ENCOUNTER — Other Ambulatory Visit: Payer: Self-pay | Admitting: Family Medicine

## 2019-10-04 ENCOUNTER — Other Ambulatory Visit: Payer: Self-pay | Admitting: Family Medicine

## 2019-10-07 ENCOUNTER — Encounter: Payer: Self-pay | Admitting: Family Medicine

## 2019-10-07 ENCOUNTER — Other Ambulatory Visit: Payer: Self-pay

## 2019-10-07 ENCOUNTER — Ambulatory Visit (INDEPENDENT_AMBULATORY_CARE_PROVIDER_SITE_OTHER): Payer: 59 | Admitting: Family Medicine

## 2019-10-07 VITALS — BP 130/90 | HR 81 | Ht 72.0 in | Wt 274.0 lb

## 2019-10-07 DIAGNOSIS — M4200 Juvenile osteochondrosis of spine, site unspecified: Secondary | ICD-10-CM | POA: Diagnosis not present

## 2019-10-07 DIAGNOSIS — M999 Biomechanical lesion, unspecified: Secondary | ICD-10-CM

## 2019-10-07 NOTE — Progress Notes (Signed)
[name] [address] [place] [address] Phone: [phone] Subjective:   I Frank [name] am serving as a Neurosurgeon for Dr. Antoine Primas.  This visit occurred during the SARS-CoV-2 public health emergency.  Safety protocols were in place, including screening questions prior to the visit, additional usage of staff PPE, and extensive cleaning of exam room while observing appropriate contact time as indicated for disinfecting solutions.   I'm seeing this patient by the request  of:  Patient, [name] Per  CC: Low back and neck pain  [id number]  ENOC GETTER is a 29 y.o. male coming in with complaint of back and neck pain. OMT 09/17/2019. Patient states he is doing ok. States he was in a lot of pain last visit and his body did not like the adjustment. SI joint pain today as well.   Medications patient has been prescribed: Duloxetine, meloxicam, nortriptyline  Taking: Yes         Reviewed prior external information including notes and imaging from previsou exam, outside providers and external EMR if available.   As well as notes that were available from care everywhere and other healthcare systems.  Past medical history, social, surgical and family history all reviewed in electronic medical record.  No pertanent information unless stated regarding to the chief complaint.   Past Medical History:  Diagnosis Date  . ADD (attention deficit disorder)   . Anxiety   . Narcolepsy through school    No Known Allergies   Review of Systems:  No headache, visual changes, nausea, vomiting, diarrhea, constipation, dizziness, abdominal pain, skin rash, fevers, chills, night sweats, weight loss, swollen lymph nodes,  joint swelling, chest pain, shortness of breath, mood changes. POSITIVE muscle aches, body aches  Objective  Blood pressure 130/90, pulse 81, height 6' (1.829 m), weight 274 lb (124.3 kg), SpO2 98 %.   General: No apparent distress  alert and oriented x3 mood and affect normal, dressed appropriately.  HEENT: Pupils equal, extraocular movements intact  Respiratory: Patient's speak in full sentences and does not appear short of breath  Cardiovascular: No lower extremity edema, non tender, no erythema  Neuro: Cranial nerves II through XII are intact, neurovascularly intact in all extremities with 2+ DTRs and 2+ pulses.  Gait normal with good balance and coordination.  MSK:  Non tender with full range of motion and good stability and symmetric strength and tone of shoulders, elbows, wrist, hip, knee and ankles bilaterally.  Back - Normal skin, Spine with normal alignment and no deformity.  No tenderness to vertebral process palpation.  Paraspinous muscles are not tender and without spasm.   Range of motion is full at neck and lumbar sacral regions  Osteopathic findings  C2 flexed rotated and side bent right C7 flexed rotated and side bent left T3 extended rotated and side bent right inhaled rib T9 extended rotated and side bent left L2 flexed rotated and side bent right Sacrum left on left       Assessment and Plan:   Scheuermann's disease Continue chronic problems.  Patient did have the adaptation of the Celebrex.  Patient given new prescription and 40 mg.  Continue to work with his Event organiser as well as physical therapist.  Patient will see me before his trip for the shoulder and see if anything else is necessary.  Follow-up again in the 4 to 8 weeks    Nonallopathic problems  Decision today to treat with OMT was based on  Physical Exam  After verbal consent patient was treated with HVLA, ME, FPR techniques in cervical, rib, thoracic, lumbar, and sacral  areas  Patient tolerated the procedure well with improvement in symptoms  Patient given exercises, stretches and lifestyle modifications  See medications in patient instructions if given  Patient will follow up in 4-8 weeks      The above  documentation has been reviewed and is accurate and complete [name], DO       Note: This dictation was prepared with Dragon dictation along with smaller phrase technology. Any transcriptional errors that result from this process are unintentional.

## 2019-10-07 NOTE — Patient Instructions (Addendum)
See me at next scheduled visit May do injection if still in pain

## 2019-10-07 NOTE — Assessment & Plan Note (Signed)
Continue chronic problems.  Patient did have the adaptation of the Celebrex.  Patient given new prescription and 40 mg.  Continue to work with his Event organiser as well as physical therapist.  Patient will see me before his trip for the shoulder and see if anything else is necessary.  Follow-up again in the 4 to 8 weeks

## 2019-10-20 NOTE — Progress Notes (Signed)
[name] [address] [place] [address] Phone: [phone] Subjective:   I Frank [name] am serving as a Neurosurgeon for Dr. Antoine Primas.  This visit occurred during the SARS-CoV-2 public health emergency.  Safety protocols were in place, including screening questions prior to the visit, additional usage of staff PPE, and extensive cleaning of exam room while observing appropriate contact time as indicated for disinfecting solutions.   I'm seeing this patient by the request  of:  Patient, [name] Per  CC: Back pain and left shoulder pain  [id number]  Frank [name] is a 29 y.o. male coming in with complaint of back and neck pain. OMT 10/07/2019. Patient states he is painful today. States he has been active lately. Shoulder getting better and somewhat sore.  Patient has been doing a lot more lifting, has been doing some dry needling as well as using a massage and that does seem to be helpful.  5 days ago he would have wanted an injection today he feels like he is making improvement  Medications patient has been prescribed: Cymbalta, nortriptyline, Zanaflex  Taking:         Reviewed prior external information including notes and imaging from previsou exam, outside providers and external EMR if available.   As well as notes that were available from care everywhere and other healthcare systems.  Past medical history, social, surgical and family history all reviewed in electronic medical record.  No pertanent information unless stated regarding to the chief complaint.   Past Medical History:  Diagnosis Date  . ADD (attention deficit disorder)   . Anxiety   . Narcolepsy through school    No Known Allergies   Review of Systems:  No headache, visual changes, nausea, vomiting, diarrhea, constipation, dizziness, abdominal pain, skin rash, fevers, chills, night sweats, weight loss, swollen lymph nodes, body aches, joint swelling, chest pain,  shortness of breath, mood changes. POSITIVE muscle aches  Objective  Blood pressure 140/90, pulse 77, height 6' (1.829 m), weight 278 lb (126.1 kg), SpO2 98 %.   General: No apparent distress alert and oriented x3 mood and affect normal, dressed appropriately.  HEENT: Pupils equal, extraocular movements intact  Respiratory: Patient's speak in full sentences and does not appear short of breath  Cardiovascular: No lower extremity edema, non tender, no erythema  Neuro: Cranial nerves II through XII are intact, neurovascularly intact in all extremities with 2+ DTRs and 2+ pulses.  Gait normal with good balance and coordination.  MSK:  Non tender with full range of motion and good stability and symmetric strength and tone of shoulders, elbows, wrist, hip, knee and ankles bilaterally.  Back -no palpable does have some mild loss of lordosis, some tenderness to palpation of the paraspinal musculature lumbar spine right greater than left.  Worsening pain with any extension of the back.  Negative straight leg test, negative FABER test, neurovascular intact distally with 5 out of 5 strength  Osteopathic findings  C6 flexed rotated and side bent left T3 extended rotated and side bent right inhaled rib T9 extended rotated and side bent left L2 flexed rotated and side bent right Sacrum right on right       Assessment and Plan:  Scheuermann's disease Stable, patient has responded well to manipulation, discussed medications including the nortriptyline, meloxicam, Zanaflex follow-up again 4 to 8 weeks     Nonallopathic problems  Decision today to treat with OMT was based on Physical Exam  After verbal  consent patient was treated with HVLA, ME, FPR techniques in cervical, rib, thoracic, lumbar, and sacral  areas  Patient tolerated the procedure well with improvement in symptoms  Patient given exercises, stretches and lifestyle modifications  See medications in patient instructions if  given  Patient will follow up in 4-8 weeks      The above documentation has been reviewed and is accurate and complete Frank Saa, DO       Note: This dictation was prepared with Dragon dictation along with smaller phrase technology. Any transcriptional errors that result from this process are unintentional.

## 2019-10-21 ENCOUNTER — Encounter: Payer: Self-pay | Admitting: Family Medicine

## 2019-10-21 ENCOUNTER — Other Ambulatory Visit: Payer: Self-pay

## 2019-10-21 ENCOUNTER — Ambulatory Visit (INDEPENDENT_AMBULATORY_CARE_PROVIDER_SITE_OTHER): Payer: 59 | Admitting: Family Medicine

## 2019-10-21 VITALS — BP 140/90 | HR 77 | Ht 72.0 in | Wt 278.0 lb

## 2019-10-21 DIAGNOSIS — M999 Biomechanical lesion, unspecified: Secondary | ICD-10-CM | POA: Diagnosis not present

## 2019-10-21 DIAGNOSIS — M4200 Juvenile osteochondrosis of spine, site unspecified: Secondary | ICD-10-CM | POA: Diagnosis not present

## 2019-10-21 NOTE — Assessment & Plan Note (Signed)
Stable, patient has responded well to manipulation, discussed medications including the nortriptyline, meloxicam, Zanaflex follow-up again 4 to 8 weeks

## 2019-10-21 NOTE — Patient Instructions (Signed)
Good to see you See me again in 4-6 weeks 

## 2019-10-23 ENCOUNTER — Other Ambulatory Visit: Payer: Self-pay | Admitting: Family Medicine

## 2019-10-28 ENCOUNTER — Ambulatory Visit: Payer: 59 | Admitting: Family Medicine

## 2019-10-30 NOTE — Progress Notes (Signed)
[name] [address] [place] [address] Phone: [phone] Subjective:   [name], am serving as a scribe for Dr. Antoine Primas. This visit occurred during the SARS-CoV-2 public health emergency.  Safety protocols were in place, including screening questions prior to the visit, additional usage of staff PPE, and extensive cleaning of exam room while observing appropriate contact time as indicated for disinfecting solutions.   I'm seeing this patient by the request  of:  Patient, [name] Per  CC: Back pain follow-up  EQA:[id number]  Frank [name] is a 29 y.o. male coming in with complaint of back and neck pain. OMT 10/21/2019. Patient states that his left side is bothering him after a hunting trip. Continues to have left shoulder pain.  Patient has had some dry needling done on the left shoulder.  Feels like the shoulder started to have more exacerbation after loading and unloading a truck while he was on his hunting escapade.  Taking:Zanaflex, pamelor, Meloxicam, voltaren, cymbalta patient does feel that all of them are making some improvement.        Reviewed prior external information including notes and imaging from previsou exam, outside providers and external EMR if available.   As well as notes that were available from care everywhere and other healthcare systems.  Past medical history, social, surgical and family history all reviewed in electronic medical record.  No pertanent information unless stated regarding to the chief complaint.   Past Medical History:  Diagnosis Date  . ADD (attention deficit disorder)   . Anxiety   . Narcolepsy through school    No Known Allergies   Review of Systems:  No headache, visual changes, nausea, vomiting, diarrhea, constipation, dizziness, abdominal pain, skin rash, fevers, chills, night sweats, weight loss, swollen lymph nodes, joint swelling, chest pain, shortness of breath, mood  changes. POSITIVE muscle aches, body aches  Objective  Blood pressure (!) 138/102, pulse 88, height 6' (1.829 m), weight 273 lb (123.8 kg), SpO2 98 %.   General: No apparent distress alert and oriented x3 mood and affect normal, dressed appropriately.  HEENT: Pupils equal, extraocular movements intact  Respiratory: Patient's speak in full sentences and does not appear short of breath  Cardiovascular: No lower extremity edema, non tender, no erythema  Neuro: Cranial nerves II through XII are intact, neurovascularly intact in all extremities with 2+ DTRs and 2+ pulses.  Gait normal with good balance and coordination.  MSK:  Non tender with full range of motion and good stability and symmetric strength and tone of , elbows, wrist, hip, knee and ankles bilaterally.  Left shoulder shows some very mild impingement with internal range of motion but otherwise fairly unremarkable with 5 out of 5 strength.  Questionable positive O'Brien's Back -back exam shows increasing tightness around the parascapular region left greater than right.  Patient does have what appears to be more of an inhaled rib Low back exam still has some poor core strength noted.  Tender to palpation in the paraspinal musculature.  Tightness with FABER test bilaterally.  Osteopathic findings  C6 flexed rotated and side bent left T3 extended rotated and side bent left inhaled rib T7 extended rotated and side bent left L2 flexed rotated and side bent right Sacrum right on right       Assessment and Plan: Scheuermann's disease Continues to have some difficulty but seems to be doing relatively well.  We will get x-rays of patient's left shoulder from patient  continuing to have some difficulty.  If this continues we will consider the possibility of injection at follow-up under ultrasound guidance.  Would like to avoid advanced imaging because I do not think it would change medical management patient will continue to work with his  personal trainer and physical therapist. Discussed medications with him including the nortriptyline, Cymbalta and refills given today   Nonallopathic problems  Decision today to treat with OMT was based on Physical Exam  After verbal consent patient was treated with HVLA, ME, FPR techniques in cervical, rib, thoracic, lumbar, and sacral  areas  Patient tolerated the procedure well with improvement in symptoms  Patient given exercises, stretches and lifestyle modifications  See medications in patient instructions if given  Patient will follow up in 4-8 weeks      The above documentation has been reviewed and is accurate and complete [name], DO       Note: This dictation was prepared with Dragon dictation along with smaller phrase technology. Any transcriptional errors that result from this process are unintentional.

## 2019-11-03 ENCOUNTER — Encounter: Payer: Self-pay | Admitting: Family Medicine

## 2019-11-03 ENCOUNTER — Ambulatory Visit (INDEPENDENT_AMBULATORY_CARE_PROVIDER_SITE_OTHER): Payer: 59

## 2019-11-03 ENCOUNTER — Ambulatory Visit (INDEPENDENT_AMBULATORY_CARE_PROVIDER_SITE_OTHER): Payer: 59 | Admitting: Family Medicine

## 2019-11-03 ENCOUNTER — Other Ambulatory Visit: Payer: Self-pay | Admitting: Family Medicine

## 2019-11-03 ENCOUNTER — Other Ambulatory Visit: Payer: Self-pay

## 2019-11-03 VITALS — BP 138/102 | HR 88 | Ht 72.0 in | Wt 273.0 lb

## 2019-11-03 DIAGNOSIS — G8929 Other chronic pain: Secondary | ICD-10-CM

## 2019-11-03 DIAGNOSIS — M999 Biomechanical lesion, unspecified: Secondary | ICD-10-CM | POA: Diagnosis not present

## 2019-11-03 DIAGNOSIS — M25512 Pain in left shoulder: Secondary | ICD-10-CM

## 2019-11-03 DIAGNOSIS — M4200 Juvenile osteochondrosis of spine, site unspecified: Secondary | ICD-10-CM | POA: Diagnosis not present

## 2019-11-03 NOTE — Assessment & Plan Note (Signed)
Continues to have some difficulty but seems to be doing relatively well.  We will get x-rays of patient's left shoulder from patient continuing to have some difficulty.  If this continues we will consider the possibility of injection at follow-up under ultrasound guidance.  Would like to avoid advanced imaging because I do not think it would change medical management patient will continue to work with his personal trainer and physical therapist.

## 2019-11-03 NOTE — Patient Instructions (Signed)
Have PT work with scapular stability exercises Xray of shoulder on the way out Glad you are not as bad as anticipated  See me as scheduled

## 2019-11-24 ENCOUNTER — Ambulatory Visit (INDEPENDENT_AMBULATORY_CARE_PROVIDER_SITE_OTHER): Payer: 59 | Admitting: Family Medicine

## 2019-11-24 ENCOUNTER — Encounter: Payer: Self-pay | Admitting: Family Medicine

## 2019-11-24 ENCOUNTER — Other Ambulatory Visit: Payer: Self-pay

## 2019-11-24 VITALS — BP 130/92 | HR 85 | Ht 72.0 in | Wt 281.0 lb

## 2019-11-24 DIAGNOSIS — M4200 Juvenile osteochondrosis of spine, site unspecified: Secondary | ICD-10-CM

## 2019-11-24 DIAGNOSIS — M25512 Pain in left shoulder: Secondary | ICD-10-CM | POA: Diagnosis not present

## 2019-11-24 DIAGNOSIS — M999 Biomechanical lesion, unspecified: Secondary | ICD-10-CM | POA: Diagnosis not present

## 2019-11-24 NOTE — Patient Instructions (Signed)
See me in 4 weeks If shoulder is still bothering you then, we will inject

## 2019-11-24 NOTE — Progress Notes (Signed)
[name] [address] [place] [address] Phone: [phone] Subjective:   Frank [name], am serving as a scribe for Dr. Antoine Primas. This visit occurred during the SARS-CoV-2 public health emergency.  Safety protocols were in place, including screening questions prior to the visit, additional usage of staff PPE, and extensive cleaning of exam room while observing appropriate contact time as indicated for disinfecting solutions.   I'm seeing this patient by the request  of:  Patient, [name] Per  CC: Back pain follow-up  [id number]  Frank [name] is a 29 y.o. male coming in with complaint of back and neck pain. OMT 11/03/2019. Patient states that his hips feel "out" for a the past couple of weeks. Pain in left shoulder and cervical spine.   Medications patient has been prescribed: Cymbalta, Mobic, Trazadone           Reviewed prior external information including notes and imaging from previsou exam, outside providers and external EMR if available.   As well as notes that were available from care everywhere and other healthcare systems.  Past medical history, social, surgical and family history all reviewed in electronic medical record.  No pertanent information unless stated regarding to the chief complaint.   Past Medical History:  Diagnosis Date  . ADD (attention deficit disorder)   . Anxiety   . Narcolepsy through school    No Known Allergies   Review of Systems:  No headache, visual changes, nausea, vomiting, diarrhea, constipation, dizziness, abdominal pain, skin rash, fevers, chills, night sweats, weight loss, swollen lymph nodes, body aches, joint swelling, chest pain, shortness of breath, mood changes. POSITIVE muscle aches  Objective  Blood pressure (!) 130/92, pulse 85, height 6' (1.829 m), weight 281 lb (127.5 kg), SpO2 98 %.   General: No apparent distress alert and oriented x3 mood and affect normal, dressed  appropriately.  HEENT: Pupils equal, extraocular movements intact  Respiratory: Patient's speak in full sentences and does not appear short of breath  Cardiovascular: No lower extremity edema, non tender, no erythema  Neuro: Cranial nerves II through XII are intact, neurovascularly intact in all extremities with 2+ DTRs and 2+ pulses.  Gait normal with good balance and coordination.  MSK:  Non tender with full range of motion and good stability and symmetric strength and tone of  elbows, wrist, hip, knee and ankles bilaterally.  Back -tightness noted in the paraspinal musculature mostly in the thoracolumbar junction, right greater than left.  Patient does have some tenderness in the left shoulder.  Very mild positive impingement.  Negative straight leg test, tightness with Pearlean Brownie right greater than left  Osteopathic findings  C2 flexed rotated and side bent right T5 extended rotated and side bent right inhaled rib T10 extended rotated and side bent left L1 flexed rotated and side bent left Sacrum right on right       Assessment and Plan:  Trigger point of left shoulder region Patient continues to have some mild impingement of the shoulder.  We will consider intra-articular injection if this continues in 1 month.  Patient is making some progress well with the home exercises.  Follow-up with me again in 4 to 5 weeks  Scheuermann's disease Stable.  Patient is being fairly active.  Continuing to have some discomfort.  We discussed icing regimen and home exercises, we discussed the medications including the meloxicam and Cymbalta.  Follow-up with me again in 4 to 5 weeks.  Continue to  work with physical therapy.    Nonallopathic problems  Decision today to treat with OMT was based on Physical Exam  After verbal consent patient was treated with HVLA, ME, FPR techniques in cervical, rib, thoracic, lumbar, and sacral  areas  Patient tolerated the procedure well with improvement in  symptoms  Patient given exercises, stretches and lifestyle modifications  See medications in patient instructions if given  Patient will follow up in 4-8 weeks      The above documentation has been reviewed and is accurate and complete [name], DO       Note: This dictation was prepared with Dragon dictation along with smaller phrase technology. Any transcriptional errors that result from this process are unintentional.

## 2019-11-24 NOTE — Assessment & Plan Note (Signed)
Stable.  Patient is being fairly active.  Continuing to have some discomfort.  We discussed icing regimen and home exercises, we discussed the medications including the meloxicam and Cymbalta.  Follow-up with me again in 4 to 5 weeks.  Continue to work with physical therapy.

## 2019-11-24 NOTE — Assessment & Plan Note (Signed)
Patient continues to have some mild impingement of the shoulder.  We will consider intra-articular injection if this continues in 1 month.  Patient is making some progress well with the home exercises.  Follow-up with me again in 4 to 5 weeks

## 2019-11-27 ENCOUNTER — Other Ambulatory Visit: Payer: Self-pay | Admitting: Family Medicine

## 2019-11-30 ENCOUNTER — Other Ambulatory Visit: Payer: Self-pay | Admitting: Family Medicine

## 2019-12-17 ENCOUNTER — Ambulatory Visit (INDEPENDENT_AMBULATORY_CARE_PROVIDER_SITE_OTHER): Payer: 59 | Admitting: Family Medicine

## 2019-12-17 ENCOUNTER — Encounter: Payer: Self-pay | Admitting: Family Medicine

## 2019-12-17 ENCOUNTER — Other Ambulatory Visit: Payer: Self-pay

## 2019-12-17 VITALS — BP 122/90 | HR 87 | Ht 72.0 in | Wt 281.0 lb

## 2019-12-17 DIAGNOSIS — M999 Biomechanical lesion, unspecified: Secondary | ICD-10-CM | POA: Diagnosis not present

## 2019-12-17 DIAGNOSIS — M25512 Pain in left shoulder: Secondary | ICD-10-CM

## 2019-12-17 DIAGNOSIS — M4200 Juvenile osteochondrosis of spine, site unspecified: Secondary | ICD-10-CM | POA: Diagnosis not present

## 2019-12-17 DIAGNOSIS — G8929 Other chronic pain: Secondary | ICD-10-CM | POA: Diagnosis not present

## 2019-12-17 NOTE — Progress Notes (Signed)
[name] [address] [place] [address] Phone: [phone] Subjective:   Frank [name], am serving as a scribe for Dr. Antoine Primas. This visit occurred during the SARS-CoV-2 public health emergency.  Safety protocols were in place, including screening questions prior to the visit, additional usage of staff PPE, and extensive cleaning of exam room while observing appropriate contact time as indicated for disinfecting solutions.  I'm seeing this patient by the request  of:  Patient, [name] Per  CC: Pain all over  [id number]  Frank [name] is a 29 y.o. male coming in with complaint of back and neck pain. OMT 11/24/2019. Patient states that he has been in a lot of pain in the past 2 weeks. Feels like his right hip is out of place. Patient states that shoulder pain is improving. Has been working with his physical therapist.   Medications patient has been prescribed: Cymbalta, Zanaflex, Trazadone  Taking: Yes but did help his amitriptyline because he was taking other medications at night         Reviewed prior external information including notes and imaging from previsou exam, outside providers and external EMR if available.   As well as notes that were available from care everywhere and other healthcare systems.  Past medical history, social, surgical and family history all reviewed in electronic medical record.  No pertanent information unless stated regarding to the chief complaint.   Past Medical History:  Diagnosis Date  . ADD (attention deficit disorder)   . Anxiety   . Narcolepsy through school    No Known Allergies   Review of Systems:  No headache, visual changes, nausea, vomiting, diarrhea, constipation, dizziness, abdominal pain, skin rash, fevers, chills, night sweats, weight loss, swollen lymph nodes, body aches, joint swelling, chest pain, shortness of breath, mood changes. POSITIVE muscle aches  Objective  Blood  pressure 122/90, pulse 87, height 6' (1.829 m), weight 281 lb (127.5 kg), SpO2 98 %.   General: No apparent distress alert and oriented x3 mood and affect normal, dressed appropriately.  HEENT: Pupils equal, extraocular movements intact  Respiratory: Patient's speak in full sentences and does not appear short of breath  Cardiovascular: No lower extremity edema, non tender, no erythema  Neuro: Cranial nerves II through XII are intact, neurovascularly intact in all extremities with 2+ DTRs and 2+ pulses.  Gait normal with good balance and coordination.  MSK:   Left shoulder continues to have impingement.  This is with Neer's and Hawkins.  Patient on rotator cuff 5 out of 5 strength. Back -mild increase in tightness especially in the hip flexors.  More tightness on the right side and the right sacral area.  Positive Pearlean Brownie.  Patient does have good range of motion of the hip though noted.  Osteopathic findings   C6 flexed rotated and side bent right  T5 extended rotated and side bent right inhaled rib T9 extended rotated and side bent left L2 flexed rotated and side bent right Sacrum right on right       Assessment and Plan:  Scheuermann's disease Continue chronic problem with exacerbation.  We did discuss plan patient Cymbalta does seem to be helping a little bit.  Discussed icing regimen and home exercises.  Discussed core strengthening.  Patient still working with physical therapist as well as a Systems analyst.  Patient will continue with the low-dose of amitriptyline and try to discontinue as much of the trazodone follow-up again in 3 to  6 weeks  Left shoulder pain At this point seems to be more of a chronic left knee pain.  Questionable bursitis.  Patient has improved with some of the trigger point injections.  Some signs and symptoms are most consistent with radicular symptoms but today no significant difficulty with the neck.  We discussed icing regimen and home exercises.   Continue other medications.  If continuing to have trouble doing I would consider the possibility of a subacromial bursa injection.  Patient has been doing formal physical therapy.  We will continue to have pain against imaging would be warranted follow-up in 3 to 6 weeks    Nonallopathic problems  Decision today to treat with OMT was based on Physical Exam  After verbal consent patient was treated with HVLA, ME, FPR techniques in cervical, rib, thoracic, lumbar, and sacral  areas  Patient tolerated the procedure well with improvement in symptoms  Patient given exercises, stretches and lifestyle modifications  See medications in patient instructions if given  Patient will follow up in 4-8 weeks      The above documentation has been reviewed and is accurate and complete [name], DO       Note: This dictation was prepared with Dragon dictation along with smaller phrase technology. Any transcriptional errors that result from this process are unintentional.

## 2019-12-17 NOTE — Assessment & Plan Note (Signed)
At this point seems to be more of a chronic left knee pain.  Questionable bursitis.  Patient has improved with some of the trigger point injections.  Some signs and symptoms are most consistent with radicular symptoms but today no significant difficulty with the neck.  We discussed icing regimen and home exercises.  Continue other medications.  If continuing to have trouble doing I would consider the possibility of a subacromial bursa injection.  Patient has been doing formal physical therapy.  We will continue to have pain against imaging would be warranted follow-up in 3 to 6 weeks

## 2019-12-17 NOTE — Patient Instructions (Signed)
Focus on hip flexor stretching Shoulder if no better will consider injection See me again in 3-6 weeks

## 2019-12-17 NOTE — Assessment & Plan Note (Addendum)
Continue chronic problem with exacerbation.  We did discuss plan patient Cymbalta does seem to be helping a little bit.  Discussed icing regimen and home exercises.  Discussed core strengthening.  Patient still working with physical therapist as well as a Systems analyst.  Patient will continue with the low-dose of amitriptyline and try to discontinue as much of the trazodone follow-up again in 3 to 6 weeks

## 2019-12-22 ENCOUNTER — Other Ambulatory Visit: Payer: Self-pay | Admitting: Family Medicine

## 2019-12-28 ENCOUNTER — Other Ambulatory Visit: Payer: Self-pay | Admitting: Family Medicine

## 2020-01-12 NOTE — Progress Notes (Signed)
[name] [address] [place] [address] Phone: [phone] Subjective:   I Frank [name] am serving as a Neurosurgeon for Dr. Antoine Primas.  This visit occurred during the SARS-CoV-2 public health emergency.  Safety protocols were in place, including screening questions prior to the visit, additional usage of staff PPE, and extensive cleaning of exam room while observing appropriate contact time as indicated for disinfecting solutions.   I'm seeing this patient by the request  of:  Patient, [name] Per  CC: Low back pain follow-up, left shoulder pain follow-up  [id number]  Frank [name] is a 29 y.o. male coming in with complaint of back and neck pain. OMT 12/17/2019. Patient states he is very sore today. Patient states he has some issues sleeping last night due to pain.  Patient states some mild discomfort.  Was doing some different lifting with his trainer name notices some mild discomfort more on the right than the left side.  Patient's left shoulder still giving her some mild discomfort but still continues to feel like he is progressing and making improvement.  Medications patient has been prescribed: Nortriptyline, Meloxicam, Zanaflex, Trazadone, Cymbalta  Taking: Yes         Reviewed prior external information including notes and imaging from previsou exam, outside providers and external EMR if available.   As well as notes that were available from care everywhere and other healthcare systems.  Past medical history, social, surgical and family history all reviewed in electronic medical record.  No pertanent information unless stated regarding to the chief complaint.   Past Medical History:  Diagnosis Date  . ADD (attention deficit disorder)   . Anxiety   . Narcolepsy through school    No Known Allergies   Review of Systems:  No headache, visual changes, nausea, vomiting, diarrhea, constipation, dizziness, abdominal pain, skin  rash, fevers, chills, night sweats, weight loss, swollen lymph nodes, body aches, joint swelling, chest pain, shortness of breath, mood changes. POSITIVE muscle aches  Objective  Blood pressure (!) 138/96, pulse 77, height 6' (1.829 m), weight 285 lb (129.3 kg), SpO2 98 %.   General: No apparent distress alert and oriented x3 mood and affect normal, dressed appropriately.  HEENT: Pupils equal, extraocular movements intact  Respiratory: Patient's speak in full sentences and does not appear short of breath  Cardiovascular: No lower extremity edema, non tender, no erythema  Neuro: Cranial nerves II through XII are intact, neurovascularly intact in all extremities with 2+ DTRs and 2+ pulses.  Gait normal with good balance and coordination.  Left shoulder exam still shows some very mild positive impingement with Hawkins.  Out of 5 strength of the rotator cuff.  Improvement in range of motion.  Osteopathic findings  C6 flexed rotated and side bent left T3 extended rotated and side bent right inhaled rib T9 extended rotated and side bent right L2 flexed rotated and side bent right Sacrum right on right       Assessment and Plan: Scheuermann's disease Chronic problem with very mild exacerbation.  Patient has been doing relatively well we will increase the Cymbalta to 60 mg.  Patient is on Wellbutrin 300.  Patient will discontinue the trazodone patient will follow up with me again in 4 weeks.  Left shoulder pain Continued difficulty.  Patient if continue to have trouble we can consider the possibility of a subacromial injection for diagnostic and hopefully therapeutic purposes.  Continued pain will need to consider the possibility of  MRI      Nonallopathic problems  Decision today to treat with OMT was based on Physical Exam  After verbal consent patient was treated with HVLA, ME, FPR techniques in cervical, rib, thoracic, lumbar, and sacral  areas  Patient tolerated the procedure  well with improvement in symptoms  Patient given exercises, stretches and lifestyle modifications  See medications in patient instructions if given  Patient will follow up in 4-8 weeks      The above documentation has been reviewed and is accurate and complete Frank Saa, DO       Note: This dictation was prepared with Dragon dictation along with smaller phrase technology. Any transcriptional errors that result from this process are unintentional.

## 2020-01-13 ENCOUNTER — Encounter: Payer: Self-pay | Admitting: Family Medicine

## 2020-01-13 ENCOUNTER — Ambulatory Visit (INDEPENDENT_AMBULATORY_CARE_PROVIDER_SITE_OTHER): Payer: 59 | Admitting: Family Medicine

## 2020-01-13 ENCOUNTER — Other Ambulatory Visit: Payer: Self-pay

## 2020-01-13 VITALS — BP 138/96 | HR 77 | Ht 72.0 in | Wt 285.0 lb

## 2020-01-13 DIAGNOSIS — G8929 Other chronic pain: Secondary | ICD-10-CM | POA: Diagnosis not present

## 2020-01-13 DIAGNOSIS — M25512 Pain in left shoulder: Secondary | ICD-10-CM | POA: Diagnosis not present

## 2020-01-13 DIAGNOSIS — M999 Biomechanical lesion, unspecified: Secondary | ICD-10-CM

## 2020-01-13 DIAGNOSIS — M4200 Juvenile osteochondrosis of spine, site unspecified: Secondary | ICD-10-CM | POA: Diagnosis not present

## 2020-01-13 NOTE — Assessment & Plan Note (Signed)
Continued difficulty.  Patient if continue to have trouble we can consider the possibility of a subacromial injection for diagnostic and hopefully therapeutic purposes.  Continued pain will need to consider the possibility of MRI

## 2020-01-13 NOTE — Assessment & Plan Note (Signed)
Chronic problem with very mild exacerbation.  Patient has been doing relatively well we will increase the Cymbalta to 60 mg.  Patient is on Wellbutrin 300.  Patient will discontinue the trazodone patient will follow up with me again in 4 weeks.

## 2020-01-13 NOTE — Patient Instructions (Addendum)
Good to see you Increase Cymbalta to 60 mg daily Hold on the shoulder see how skiing goes See me again in 4 week Happy Holidays!

## 2020-01-24 ENCOUNTER — Other Ambulatory Visit: Payer: Self-pay | Admitting: Family Medicine

## 2020-01-29 ENCOUNTER — Other Ambulatory Visit: Payer: Self-pay | Admitting: Family Medicine

## 2020-01-30 NOTE — Telephone Encounter (Signed)
Refill done.  

## 2020-02-04 ENCOUNTER — Ambulatory Visit: Payer: 59 | Admitting: Family Medicine

## 2020-02-04 NOTE — Progress Notes (Signed)
[name] [address] [place] [address] Phone: [phone] Subjective:   Frank [name], am serving as a scribe for Dr. Antoine Primas. This visit occurred during the SARS-CoV-2 public health emergency.  Safety protocols were in place, including screening questions prior to the visit, additional usage of staff PPE, and extensive cleaning of exam room while observing appropriate contact time as indicated for disinfecting solutions.   I'm seeing this patient by the request  of:  Patient, [name] Per  CC: back pain follow up   [id number]  NIKOLOZ HUY is a 29 y.o. male coming in with complaint of back and neck pain. OMT 01/13/2020. Patient states that he just got back from a skiing trip. States that he is sore from the trip.   Medications patient has been prescribed:  Cymbalta, Nortriptyline, Zanaflex  Taking: Yes         Reviewed prior external information including notes and imaging from previsou exam, outside providers and external EMR if available.   As well as notes that were available from care everywhere and other healthcare systems.  Past medical history, social, surgical and family history all reviewed in electronic medical record.  No pertanent information unless stated regarding to the chief complaint.   Past Medical History:  Diagnosis Date  . ADD (attention deficit disorder)   . Anxiety   . Narcolepsy through school    No Known Allergies   Review of Systems:  No headache, visual changes, nausea, vomiting, diarrhea, constipation, dizziness, abdominal pain, skin rash, fevers, chills, night sweats, weight loss, swollen lymph nodes, body aches, joint swelling, chest pain, shortness of breath, mood changes. POSITIVE muscle aches  Objective  Blood pressure 128/88, pulse (!) 119, height 6' (1.829 m), weight 268 lb (121.6 kg), SpO2 98 %.   General: No apparent distress alert and oriented x3 mood and affect normal,  dressed appropriately.  HEENT: Pupils equal, extraocular movements intact  Respiratory: Patient's speak in full sentences and does not appear short of breath  Cardiovascular: No lower extremity edema, non tender, no erythema  Gait normal with good balance and coordination.  MSK: Left shoulder exam shows the patient does have good range of motion.  Mild positive impingement noted.  Negative empty can. Back -mild loss of lordosis of the lumbar spine.  Tightness noted in the thoracolumbar juncture, right greater than left.  Patient does have tightness and Pearlean Brownie.  Patient lacks the last 5 degrees of extension.  Osteopathic findings   C6 flexed rotated and side bent left T3 extended rotated and side bent left inhaled rib L2 flexed rotated and side bent right Sacrum right on right       Assessment and Plan:  Scheuermann's disease Chronic problem with stable.  Patient seems to be doing relatively well.  Discussed medications such as the Cymbalta and the meloxicam.  Patient does have the nortriptyline as well.  Patient increase his activity and was able to do a skiing trip with very minimal breakthrough pain.  Patient will follow up with me again in 4 to 5 weeks.  Left shoulder pain Continues to have some mild left shoulder pain.  Patient has improved range of motion.  Overall seems to be doing well but I do feel like this is going on long enough.  We discussed at follow-up consider ultrasound and potential injections then if continuing to have trouble patient will need MRI.    Nonallopathic problems  Decision today to treat with  OMT was based on Physical Exam  After verbal consent patient was treated with HVLA, ME, FPR techniques in cervical, rib, thoracic, lumbar, and sacral  areas  Patient tolerated the procedure well with improvement in symptoms  Patient given exercises, stretches and lifestyle modifications  See medications in patient instructions if given  Patient will follow  up in 4-8 weeks      The above documentation has been reviewed and is accurate and complete [name], DO       Note: This dictation was prepared with Dragon dictation along with smaller phrase technology. Any transcriptional errors that result from this process are unintentional.

## 2020-02-05 ENCOUNTER — Ambulatory Visit (INDEPENDENT_AMBULATORY_CARE_PROVIDER_SITE_OTHER): Payer: 59 | Admitting: Family Medicine

## 2020-02-05 ENCOUNTER — Encounter: Payer: Self-pay | Admitting: Family Medicine

## 2020-02-05 ENCOUNTER — Other Ambulatory Visit: Payer: Self-pay

## 2020-02-05 VITALS — BP 128/88 | HR 119 | Ht 72.0 in | Wt 268.0 lb

## 2020-02-05 DIAGNOSIS — M25512 Pain in left shoulder: Secondary | ICD-10-CM | POA: Diagnosis not present

## 2020-02-05 DIAGNOSIS — G8929 Other chronic pain: Secondary | ICD-10-CM

## 2020-02-05 DIAGNOSIS — M4200 Juvenile osteochondrosis of spine, site unspecified: Secondary | ICD-10-CM | POA: Diagnosis not present

## 2020-02-05 DIAGNOSIS — M999 Biomechanical lesion, unspecified: Secondary | ICD-10-CM | POA: Diagnosis not present

## 2020-02-05 NOTE — Assessment & Plan Note (Signed)
Chronic problem with stable.  Patient seems to be doing relatively well.  Discussed medications such as the Cymbalta and the meloxicam.  Patient does have the nortriptyline as well.  Patient increase his activity and was able to do a skiing trip with very minimal breakthrough pain.  Patient will follow up with me again in 4 to 5 weeks.

## 2020-02-05 NOTE — Assessment & Plan Note (Signed)
Continues to have some mild left shoulder pain.  Patient has improved range of motion.  Overall seems to be doing well but I do feel like this is going on long enough.  We discussed at follow-up consider ultrasound and potential injections then if continuing to have trouble patient will need MRI.

## 2020-02-05 NOTE — Patient Instructions (Signed)
Glad skiing went well Give shoulder more time May [place] and inject See me in 4 weeks

## 2020-02-11 ENCOUNTER — Ambulatory Visit: Payer: 59 | Admitting: Family Medicine

## 2020-02-12 ENCOUNTER — Other Ambulatory Visit: Payer: Self-pay | Admitting: Family Medicine

## 2020-02-24 NOTE — Progress Notes (Signed)
[name] [address] [place] [address] Phone: [phone] Subjective:   I Frank [name] am serving as a Neurosurgeon for Dr. Antoine Primas.  This visit occurred during the SARS-CoV-2 public health emergency.  Safety protocols were in place, including screening questions prior to the visit, additional usage of staff PPE, and extensive cleaning of exam room while observing appropriate contact time as indicated for disinfecting solutions.   I'm seeing this patient by the request  of:  Patient, [name] Per  CC: Back pain follow-up  [id number]  Frank [name] is a 30 y.o. male coming in with complaint of back and neck pain. OMT 02/05/2020. Patient states he is sore but overall doing well.   Medications patient has been prescribed: Nortriptyline, Meloxicam, Zanaflex, Trazadone, Cymbalta  Taking: Yes         Reviewed prior external information including notes and imaging from previsou exam, outside providers and external EMR if available.   As well as notes that were available from care everywhere and other healthcare systems.  Past medical history, social, surgical and family history all reviewed in electronic medical record.  No pertanent information unless stated regarding to the chief complaint.   Past Medical History:  Diagnosis Date   ADD (attention deficit disorder)    Anxiety    Narcolepsy through school    No Known Allergies   Review of Systems:  No headache, visual changes, nausea, vomiting, diarrhea, constipation, dizziness, abdominal pain, skin rash, fevers, chills, night sweats, weight loss, swollen lymph nodes, joint swelling, chest pain, shortness of breath, mood changes. POSITIVE muscle aches, body aches  Objective  Blood pressure 140/80, pulse 90, height 6' (1.829 m), weight 281 lb (127.5 kg), SpO2 98 %.   General: No apparent distress alert and oriented x3 mood and affect normal, dressed appropriately.  HEENT:  Pupils equal, extraocular movements intact  Respiratory: Patient's speak in full sentences and does not appear short of breath  Cardiovascular: No lower extremity edema, non tender, no erythema  Low back exam does have some tightness noted.  Tender to palpation in the paraspinal musculature of the lumbar spine from the thoracolumbar juncture to the sacral area bilaterally today.  Mild increase Baseline but not in the hips as much.  Negative Faber.  Mild tightness noted with extension of the back  Left shoulder exam still has very mild impingement but otherwise fairly unremarkable with 5 out of 5 strength   Osteopathic findings  C6 flexed rotated and side bent left T3 extended rotated and side bent right inhaled rib T5 extended rotated and side bent left L2 flexed rotated and side bent right Sacrum right on right       Assessment and Plan:  Scheuermann's disease Patient continues to have problems that does not seem to breakthrough.  Patient is continuing on the medications including the Cymbalta at 60 mg.  These are chronic problems with mild exacerbation.  Patient is making progress I would say or at least not make anything worse.  Follow-up again in 4 to 8 weeks.  Left shoulder pain Patient states he continues to make some improvement.  We need to consider the possibility of ultrasound or MRI and follow-up.  Patient is considering potential injection if this continues to give him trouble as well and we can discuss at follow-up    Nonallopathic problems  Decision today to treat with OMT was based on Physical Exam  After verbal consent patient was treated with HVLA,  ME, FPR techniques in cervical, rib, thoracic, lumbar, and sacral  areas  Patient tolerated the procedure well with improvement in symptoms  Patient given exercises, stretches and lifestyle modifications  See medications in patient instructions if given  Patient will follow up in 4-8 weeks      The above  documentation has been reviewed and is accurate and complete [name], DO       Note: This dictation was prepared with Dragon dictation along with smaller phrase technology. Any transcriptional errors that result from this process are unintentional.

## 2020-02-25 ENCOUNTER — Ambulatory Visit (INDEPENDENT_AMBULATORY_CARE_PROVIDER_SITE_OTHER): Payer: 59 | Admitting: Family Medicine

## 2020-02-25 ENCOUNTER — Other Ambulatory Visit: Payer: Self-pay

## 2020-02-25 ENCOUNTER — Encounter: Payer: Self-pay | Admitting: Family Medicine

## 2020-02-25 VITALS — BP 140/80 | HR 90 | Ht 72.0 in | Wt 281.0 lb

## 2020-02-25 DIAGNOSIS — M4200 Juvenile osteochondrosis of spine, site unspecified: Secondary | ICD-10-CM

## 2020-02-25 DIAGNOSIS — M999 Biomechanical lesion, unspecified: Secondary | ICD-10-CM | POA: Diagnosis not present

## 2020-02-25 DIAGNOSIS — G8929 Other chronic pain: Secondary | ICD-10-CM

## 2020-02-25 DIAGNOSIS — M25512 Pain in left shoulder: Secondary | ICD-10-CM | POA: Diagnosis not present

## 2020-02-25 NOTE — Assessment & Plan Note (Signed)
Patient continues to have problems that does not seem to breakthrough.  Patient is continuing on the medications including the Cymbalta at 60 mg.  These are chronic problems with mild exacerbation.  Patient is making progress I would say or at least not make anything worse.  Follow-up again in 4 to 8 weeks.

## 2020-02-25 NOTE — Assessment & Plan Note (Signed)
Patient states he continues to make some improvement.  We need to consider the possibility of ultrasound or MRI and follow-up.  Patient is considering potential injection if this continues to give him trouble as well and we can discuss at follow-up

## 2020-02-25 NOTE — Patient Instructions (Addendum)
Good to see you We will keep watching the shoulder See me again in 4-6 weeks

## 2020-03-03 ENCOUNTER — Other Ambulatory Visit: Payer: Self-pay | Admitting: Family Medicine

## 2020-03-11 ENCOUNTER — Other Ambulatory Visit: Payer: Self-pay

## 2020-03-11 MED ORDER — DULOXETINE HCL 60 MG PO CPEP: 60.0000 mg | ORAL_CAPSULE | Freq: Every day | ORAL | 0 refills | Status: DC

## 2020-03-16 NOTE — Progress Notes (Signed)
[name] [address] [place] [address] Phone: [phone] Subjective:   Frank [name], am serving as a scribe for Dr. Antoine Primas. This visit occurred during the SARS-CoV-2 public health emergency.  Safety protocols were in place, including screening questions prior to the visit, additional usage of staff PPE, and extensive cleaning of exam room while observing appropriate contact time as indicated for disinfecting solutions.  I'm seeing this patient by the request  of:  Patient, [name] Per  CC: Neck and back pain follow-up  [id number]  Frank [name] is a 30 y.o. male coming in with complaint of back and neck pain. OMT 02/25/2020. Patient states that he has been sore but not in pain. Patient continues to work with his physical therapist as well as Psychologist, educational. Doing relatively well overall. Patient is doing better with the Cymbalta at 60 mg as well. No side effects. States that he has discomfort or pain daily but nothing that stops him. Still having some mild left shoulder pain but nothing that is stopping from activity.  Medications patient has been prescribed: Cymbalta, Trazadone, Zanaflex  Taking: Yes         Reviewed prior external information including notes and imaging from previsou exam, outside providers and external EMR if available.   As well as notes that were available from care everywhere and other healthcare systems.  Past medical history, social, surgical and family history all reviewed in electronic medical record.  No pertanent information unless stated regarding to the chief complaint.   Past Medical History:  Diagnosis Date  . ADD (attention deficit disorder)   . Anxiety   . Narcolepsy through school    No Known Allergies   Review of Systems:  No headache, visual changes, nausea, vomiting, diarrhea, constipation, dizziness, abdominal pain, skin rash, fevers, chills, night sweats, weight loss, swollen lymph  nodes, , joint swelling, chest pain, shortness of breath, mood changes. POSITIVE muscle aches, body aches  Objective  Blood pressure 120/84, pulse (!) 101, height 6' (1.829 m), weight 280 lb (127 kg), SpO2 98 %.   General: No apparent distress alert and oriented x3 mood and affect normal, dressed appropriately.  HEENT: Pupils equal, extraocular movements intact  Respiratory: Patient's speak in full sentences and does not appear short of breath  Cardiovascular: No lower extremity edema, non tender, no erythema  Gait normal with good balance and coordination.  MSK:   Back -back exam*patient does have some loss of lordosis still. Tightness in the thoracolumbar spine and tightness more around the left sacroiliac joint. Patient has negative straight leg test with mild positive FABER test.  Neck exam mild tightness but nothing severe with full range of motion otherwise.  Osteopathic findings   C6 flexed rotated and side bent left T3 extended rotated and side bent right inhaled rib T9 extended rotated and side bent left L2 flexed rotated and side bent right Sacrum left on left       Assessment and Plan:  Left shoulder pain Patient feels like it is doing well.  Still hold on any type of injection.  Continue to range of motion.  Once again worsening pain will consider injections or advanced imaging  Scheuermann's disease Responding well to conservative therapy.  MRIs have been fairly unremarkable 4 years ago.  We will get repeat x-rays just to have.  Does have myofascial pain that is also contributing but responding very well to the Cymbalta 60 mg.  Follow-up with me  again in 4 to 6 weeks no change in medication  Myofascial pain Doing relatively well with the increase in Cymbalta of 60 mg daily.    Nonallopathic problems  Decision today to treat with OMT was based on Physical Exam  After verbal consent patient was treated with HVLA, ME, FPR techniques in cervical, rib, thoracic,  lumbar, and sacral  areas  Patient tolerated the procedure well with improvement in symptoms  Patient given exercises, stretches and lifestyle modifications  See medications in patient instructions if given  Patient will follow up in 4-8 weeks      The above documentation has been reviewed and is accurate and complete [name], DO       Note: This dictation was prepared with Dragon dictation along with smaller phrase technology. Any transcriptional errors that result from this process are unintentional.

## 2020-03-17 ENCOUNTER — Ambulatory Visit (INDEPENDENT_AMBULATORY_CARE_PROVIDER_SITE_OTHER): Payer: 59 | Admitting: Family Medicine

## 2020-03-17 ENCOUNTER — Other Ambulatory Visit: Payer: Self-pay

## 2020-03-17 ENCOUNTER — Encounter: Payer: Self-pay | Admitting: Family Medicine

## 2020-03-17 VITALS — BP 120/84 | HR 101 | Ht 72.0 in | Wt 280.0 lb

## 2020-03-17 DIAGNOSIS — M999 Biomechanical lesion, unspecified: Secondary | ICD-10-CM

## 2020-03-17 DIAGNOSIS — M25512 Pain in left shoulder: Secondary | ICD-10-CM

## 2020-03-17 DIAGNOSIS — M4200 Juvenile osteochondrosis of spine, site unspecified: Secondary | ICD-10-CM | POA: Diagnosis not present

## 2020-03-17 DIAGNOSIS — G8929 Other chronic pain: Secondary | ICD-10-CM

## 2020-03-17 DIAGNOSIS — M545 Low back pain, unspecified: Secondary | ICD-10-CM

## 2020-03-17 DIAGNOSIS — M7918 Myalgia, other site: Secondary | ICD-10-CM

## 2020-03-17 NOTE — Patient Instructions (Signed)
Can consider Zyrtec at night See me in 4-6 weeks

## 2020-03-17 NOTE — Assessment & Plan Note (Signed)
Patient feels like it is doing well.  Still hold on any type of injection.  Continue to range of motion.  Once again worsening pain will consider injections or advanced imaging

## 2020-03-17 NOTE — Assessment & Plan Note (Signed)
Responding well to conservative therapy.  MRIs have been fairly unremarkable 4 years ago.  We will get repeat x-rays just to have.  Does have myofascial pain that is also contributing but responding very well to the Cymbalta 60 mg.  Follow-up with me again in 4 to 6 weeks no change in medication

## 2020-03-17 NOTE — Assessment & Plan Note (Signed)
Doing relatively well with the increase in Cymbalta of 60 mg daily.

## 2020-03-31 ENCOUNTER — Other Ambulatory Visit: Payer: Self-pay | Admitting: Family Medicine

## 2020-04-05 ENCOUNTER — Telehealth: Payer: Self-pay

## 2020-04-05 NOTE — Progress Notes (Signed)
[name] [address] [place] [address] Phone: [phone] Subjective:   I Frank [name] am serving as a Neurosurgeon for Dr. Antoine Primas.  This visit occurred during the SARS-CoV-2 public health emergency.  Safety protocols were in place, including screening questions prior to the visit, additional usage of staff PPE, and extensive cleaning of exam room while observing appropriate contact time as indicated for disinfecting solutions.   I'm seeing this patient by the request  of:  Patient, [name] Per  CC: Neck and back pain follow-up chest pain  AJO:[id number]  Frank [name] is a 30 y.o. male coming in with complaint of back and neck pain. OMT 03/17/2020. Patient states dropped 225- 240 lbs on his chest. States it now feels like a pinched nerve causing him not to be able to move. States his wrist gave out. Recently saw Dr. Denyse Amass. Had an xray. Still worked out after his injury. States he has had a rough morning this morning. States he has been coughing and that has been causing chest pain.  Patient's injury was now greater than 2 weeks ago.  Patient states that it did seem to be improving initially but has continued to work out.  Patient states that with the working out seem to be acutely getting worse early this week.  Saw the other provider and was given prednisone but has not taken it.  Medications patient has been prescribed: Cymbalta, Meloxicam, Nortriptyline, Trazadone, Zanaflex  Patient did have chest x-ray done at last exam that was unremarkable.         Reviewed prior external information including notes and imaging from previsou exam, outside providers and external EMR if available.   As well as notes that were available from care everywhere and other healthcare systems.  Past medical history, social, surgical and family history all reviewed in electronic medical record.  No pertanent information unless stated regarding to the chief  complaint.   Past Medical History:  Diagnosis Date  . ADD (attention deficit disorder)   . Anxiety   . Narcolepsy through school    No Known Allergies   Review of Systems:  No headache, visual changes, nausea, vomiting, diarrhea, constipation, dizziness, abdominal pain, skin rash, fevers, chills, night sweats, weight loss, swollen lymph nodes, body aches, joint swelling,  shortness of breath, mood changes. POSITIVE muscle aches pain with chest with cough and certain movements.  Objective  Blood pressure (!) 152/100, pulse 89, height 6' (1.829 m), weight 281 lb (127.5 kg), SpO2 98 %.   General: No apparent distress alert and oriented x3 mood and affect normal, dressed appropriately.  HEENT: Pupils equal, extraocular movements intact  Respiratory: Patient's speak in full sentences and does not appear short of breath  Cardiovascular: No lower extremity edema, non tender, no erythema  Gait normal with good balance and coordination.  MSK:  Non tender with full range of motion and good stability and symmetric strength and tone of shoulders, elbows, wrist, hip, knee and ankles bilaterally.  Back -patient back has more tightness noted in the paraspinal musculature of the upper scapular area.  No spinous process tenderness. Chest though exam shows the patient does have bruising from the chest that seems to be resolving.  Some pain with resisted internal movement of the arm and adduction of the arm.  Shoulder is fairly unremarkable though.  Patient is tender to palpation just right of the costal sternal border.  No crepitus noted though.  No significant swelling  noted.  Patient is able to take a deep breath with minimal discomfort.  Osteopathic findings   T3 extended rotated and side bent right  T8 extended rotated and side bent left L2 flexed rotated and side bent right Sacrum right on right       Assessment and Plan:  Chest wall pain Patient does have more of the chest wall pain.   Injury was 2 weeks ago and was improving and now worsening again.  Patient does have some bruising that seems to be residual from the accident and seems to be going in the right direction.  Patient felt it is more musculoskeletal and did respond to manipulation today.  We discussed that the differential secondary to it is having pain with the cough could be something more in the pulmonary aspect or even vascular.  We did discuss the possibility of a CT scan as well as laboratory work-up which patient declined today and will start with the prednisone.  I did discuss with him that if worsening pain please go to the emergency room immediately.  Patient is in agreement with the plan.  Patient will send an update in 4 days to tell me how he responded to the prednisone that was given by the other provider.    Nonallopathic problems  Decision today to treat with OMT was based on Physical Exam  After verbal consent patient was treated with  ME, FPR techniques in , , thoracic, lumbar, and sacral  areas due to patient's pain avoided HVLA today.  Patient tolerated the procedure well with improvement in symptoms  Patient given exercises, stretches and lifestyle modifications  See medications in patient instructions if given  Patient will follow up in 4-8 weeks      The above documentation has been reviewed and is accurate and complete [name], DO       Note: This dictation was prepared with Dragon dictation along with smaller phrase technology. Any transcriptional errors that result from this process are unintentional.

## 2020-04-05 NOTE — Progress Notes (Signed)
I, Frank [name], LAT, ATC, am serving as scribe for Dr. Clementeen Graham.  Frank [name] is a 30 y.o. male who presents to [facility] today for chest pain.  He was last seen by Dr. Katrinka Blazing on 03/17/20 for OMT for neck and back pain.  Since then, pt reports chest pain x 2 weeks that occurred while he was doing bench/chest press.  He locates his pain to his mid chest and to his mid back between his scapula.  He denies any difficulty w/ breathing or sharp chest pain.  He has completed multiple workouts since his injury but notes tightness and a dull, aching pain in his chest. A few days ago, he was doing some foam rolling to try and loosen up his back when he began having sharp pain in his mid-back between his scapula.  He describes the pain in his back between his scapula as sharp and dagger-like w/ occasional electrical-type pain into his neck and shoulders.  Aggravating factors include sitting upright, trying to transition from sitting to supine and vice-versa which feels like a lot of pressure; transitioning from sit-to-stand.  He tried to do some chest/pec stretching.  He is taking oxycodone regularly and has increased the dosage of the Tizanidine.  Radiating pain: yes into his chest and neck/upper traps/shoulders Aggravating factors: sitting upright/erect; transitioning from sit to supine and vice-versa; transitioning from sit-to-stand Treatments tried: previously prescribed medications to include oxycodone and Tizanidine   Pertinent review of systems: No fevers or chills  Relevant historical information: Scheuermann's kyphosis with Schmorl's nodes and history.   Exam:  BP 104/76 (BP Location: Left Arm, Patient Position: Sitting, Cuff Size: Large)   Pulse 74   Ht 6' (1.829 m)   Wt 279 lb 12.8 oz (126.9 kg)   SpO2 96%   BMI 37.95 kg/m  General: Well Developed, well nourished, and in no acute distress.   MSK: T-spine normal-appearing Decreased thoracic  motion. Tender palpation thoracic paraspinal musculature however nontender midline. Upper extremity strength is intact.  Reflexes and sensation are intact upper extremities.  Chest wall mildly tender palpation and sternum otherwise nontender.  Lungs clear to auscultation bilaterally. Normal work of breathing. Heart regular rate and rhythm no murmurs rubs or gallops.    Lab and Radiology Results  2 view chest x-ray images obtained today personally and independently interpreted. No acute fractures visible thoracic spine.  Loss of thoracic kyphosis is visible on x-ray today. No infiltrates or pneumothorax present. Await formal radiology review     Assessment and Plan: 30 y.o. male with acutely worsening thoracic back pain associated with some anterior chest pain.  The thoracic back pain started just recently and the chest pain started a few days after dropping weights on his chest.  Main issue today I believe is muscle spasm and dysfunction. He already has physical therapy scheduled and ready to go which should be helpful.  Today will prescribe tizanidine which should be helpful.  Prednisone will be prescribed as a backup plan as well.  He has a follow-up appointment scheduled with Dr. Katrinka Blazing as well which should be helpful.  Check back as needed.   PDMP not reviewed this encounter. Orders Placed This Encounter  Procedures  . DG Chest 2 View    Standing Status:   Future    Number of Occurrences:   1    Standing Expiration Date:   04/06/2021    Order Specific Question:   Reason for Exam (SYMPTOM  OR DIAGNOSIS REQUIRED)    Answer:   eval tspine pain and chest pain    Order Specific Question:   Preferred imaging location?    Answer:   [facility]   Meds ordered this encounter  Medications  . tiZANidine (ZANAFLEX) 4 MG tablet    Sig: Take 1 tablet (4 mg total) by mouth every 6 (six) hours as needed for muscle spasms.    Dispense:  90 tablet    Refill:  1  .  predniSONE (DELTASONE) 50 MG tablet    Sig: Take 1 tablet (50 mg total) by mouth daily. Take if not better    Dispense:  5 tablet    Refill:  0     Discussed warning signs or symptoms. Please see discharge instructions. Patient expresses understanding.   The above documentation has been reviewed and is accurate and complete Clementeen Graham, M.D.

## 2020-04-06 ENCOUNTER — Other Ambulatory Visit: Payer: Self-pay

## 2020-04-06 ENCOUNTER — Ambulatory Visit: Payer: 59 | Admitting: Family Medicine

## 2020-04-06 ENCOUNTER — Encounter: Payer: Self-pay | Admitting: Family Medicine

## 2020-04-06 ENCOUNTER — Ambulatory Visit (INDEPENDENT_AMBULATORY_CARE_PROVIDER_SITE_OTHER): Payer: 59

## 2020-04-06 ENCOUNTER — Ambulatory Visit (INDEPENDENT_AMBULATORY_CARE_PROVIDER_SITE_OTHER): Payer: 59 | Admitting: Family Medicine

## 2020-04-06 VITALS — BP 104/76 | HR 74 | Ht 72.0 in | Wt 279.8 lb

## 2020-04-06 DIAGNOSIS — M4200 Juvenile osteochondrosis of spine, site unspecified: Secondary | ICD-10-CM | POA: Diagnosis not present

## 2020-04-06 DIAGNOSIS — S20219A Contusion of unspecified front wall of thorax, initial encounter: Secondary | ICD-10-CM | POA: Diagnosis not present

## 2020-04-06 DIAGNOSIS — M546 Pain in thoracic spine: Secondary | ICD-10-CM

## 2020-04-06 MED ORDER — TIZANIDINE HCL 4 MG PO TABS: 4.0000 mg | ORAL_TABLET | Freq: Four times a day (QID) | ORAL | 1 refills | Status: DC | PRN

## 2020-04-06 MED ORDER — PREDNISONE 50 MG PO TABS: 50.0000 mg | ORAL_TABLET | Freq: Every day | ORAL | 0 refills | Status: DC

## 2020-04-06 NOTE — Telephone Encounter (Signed)
Seen by Dr. Denyse Amass 04/06/2020.

## 2020-04-06 NOTE — Patient Instructions (Signed)
Thank you for coming in today.  Plan to increase tizandine.   Plan for PT.   Use heat and TENS unit.   Use prednisone as a backup.   Please get an Xray today before you leave   Follow up with Dr Katrinka Blazing as scheduled.

## 2020-04-07 NOTE — Progress Notes (Signed)
X-ray shows no fractures.  Lungs look okay.

## 2020-04-08 ENCOUNTER — Other Ambulatory Visit: Payer: Self-pay

## 2020-04-08 ENCOUNTER — Ambulatory Visit (INDEPENDENT_AMBULATORY_CARE_PROVIDER_SITE_OTHER): Payer: 59 | Admitting: Family Medicine

## 2020-04-08 ENCOUNTER — Other Ambulatory Visit: Payer: Self-pay | Admitting: Family Medicine

## 2020-04-08 ENCOUNTER — Encounter: Payer: Self-pay | Admitting: Family Medicine

## 2020-04-08 VITALS — BP 152/100 | HR 89 | Ht 72.0 in | Wt 281.0 lb

## 2020-04-08 DIAGNOSIS — M999 Biomechanical lesion, unspecified: Secondary | ICD-10-CM | POA: Diagnosis not present

## 2020-04-08 DIAGNOSIS — R0789 Other chest pain: Secondary | ICD-10-CM | POA: Diagnosis not present

## 2020-04-08 MED ORDER — DULOXETINE HCL 60 MG PO CPEP: 60.0000 mg | ORAL_CAPSULE | Freq: Every day | ORAL | 0 refills | Status: DC

## 2020-04-08 NOTE — Assessment & Plan Note (Addendum)
Patient does have more of the chest wall pain.  Injury was 2 weeks ago and was improving and now worsening again.  Patient does have some bruising that seems to be residual from the accident and seems to be going in the right direction.  Patient felt it is more musculoskeletal and did respond to manipulation today.  We discussed that the differential secondary to it is having pain with the cough could be something more in the pulmonary aspect or even vascular.  We did discuss the possibility of a CT scan as well as laboratory work-up which patient declined today and will start with the prednisone.  I did discuss with him that if worsening pain please go to the emergency room immediately.  Patient is in agreement with the plan.  Patient will send an update in 4 days to tell me how he responded to the prednisone that was given by the other provider.  Patient did feel better after the manipulation today and only did muscle energy.

## 2020-04-08 NOTE — Patient Instructions (Addendum)
Good to see you Try pennsaid on the chest Start prednisone today Will hold on injections Hold on xrays No lifting for the next week upper extremity If chest discomfort worsens or any shortness of breath go to ER immediately  Keep appointment in 2 weeks Send me a message on monday

## 2020-04-09 ENCOUNTER — Telehealth: Payer: Self-pay | Admitting: Family Medicine

## 2020-04-09 MED ORDER — BENZONATATE 200 MG PO CAPS: 200.0000 mg | ORAL_CAPSULE | Freq: Three times a day (TID) | ORAL | 1 refills | Status: DC | PRN

## 2020-04-09 NOTE — Telephone Encounter (Signed)
Called [facility] and reviewed cough regimen.  Prescribed Tessalon pearls but also recommend menthol cough drops and Robitussin-DM

## 2020-04-09 NOTE — Telephone Encounter (Signed)
In Frank [name]'s absence, could Dr. Denyse Amass recommend a cough suppressant for this patient that won't react to any of his current meds. He is feeling better from the injury, but the persistent cough causing pain and feels like a setback to his recovery.

## 2020-04-19 NOTE — Progress Notes (Signed)
[name] [address] [place] [address] Phone: [phone] Subjective:   I Frank [name] am serving as a Neurosurgeon for Dr. Antoine Primas.  This visit occurred during the SARS-CoV-2 public health emergency.  Safety protocols were in place, including screening questions prior to the visit, additional usage of staff PPE, and extensive cleaning of exam room while observing appropriate contact time as indicated for disinfecting solutions.   I'm seeing this patient by the request  of:  Patient, [name] Per  CC: Neck and back pain follow-up  WEX:[id number]  [name] is a 30 y.o. male coming in with complaint of back and neck pain. OMT 04/08/2020. Patient states he also has bilateral hip pain. Right is worse than left. States the chest is slowly getting better as well.  Patient has no more of the radiation of pain to the back.  Patient states cough is improving as well.  Otherwise things are good he states he is out of alignment.  Feels like it is more in the hip bilaterally right greater than left.  Medications patient has been prescribed: Cymbalta, Trazadone, Zanflex, Nortriptyline  Taking: Yes         Reviewed prior external information including notes and imaging from previsou exam, outside providers and external EMR if available.   As well as notes that were available from care everywhere and other healthcare systems.  Past medical history, social, surgical and family history all reviewed in electronic medical record.  No pertanent information unless stated regarding to the chief complaint.   Past Medical History:  Diagnosis Date  . ADD (attention deficit disorder)   . Anxiety   . Narcolepsy through school    No Known Allergies   Review of Systems:  No headache, visual changes, nausea, vomiting, diarrhea, constipation, dizziness, abdominal pain, skin rash, fevers, chills, night sweats, weight loss, swollen lymph nodes, joint  swelling, chest pain, shortness of breath, mood changes. POSITIVE muscle aches, body aches  Objective  Blood pressure 140/80, pulse 91, height 6' (1.829 m), weight 277 lb (125.6 kg), SpO2 98 %.   General: No apparent distress alert and oriented x3 mood and affect normal, dressed appropriately.  HEENT: Pupils equal, extraocular movements intact  Respiratory: Patient's speak in full sentences and does not appear short of breath  Cardiovascular: No lower extremity edema, non tender, no erythema  Gait normal with good balance and coordination.  MSK:  Non tender with full range of motion and good stability and symmetric strength and tone of shoulders, elbows, wrist, hip, knee and ankles bilaterally.  Back -increasing tightness of the lumbar spine bilaterally.  Patient does have tightness of the hip flexors bilaterally.  Negative straight leg test.  More tenderness over the sacroiliac joint right greater than left. Chest exam shows the patient does have some mild pain right at the sternal costal border but otherwise significant improvement from previous exam.  Patient is able to take deep breaths without any significant discomfort.   Osteopathic findings  C2 flexed rotated and side bent right C6 flexed rotated and side bent left T3 extended rotated and side bent right inhaled rib T9 extended rotated and side bent left L2 flexed rotated and side bent right Sacrum right on right       Assessment and Plan:  Chest wall pain Patient states that the chest wall pain is significantly improved at this time.  Will discuss with him again now that if any worsening symptoms such as  the cough or the radiation through patient does need a CT scan.  Patient declined it again today.  Patient will continue to increase activity that were tolerated.  Follow-up with me again in 3 to 4 weeks.  Scheuermann's disease Known disease.  Acute pain more in the right sacroiliac joint.  Discussed with patient again at  great length.  We discussed that we could potentially consider the possibility of repeating imaging but will hold at this time.  He did respond fairly well to osteopathic manipulation.  Does have myofascial pain.  Discussed posture and ergonomics, follow-up again in 3 to 4 weeks    Nonallopathic problems  Decision today to treat with OMT was based on Physical Exam  After verbal consent patient was treated with HVLA, ME, FPR techniques in cervical, rib, thoracic, lumbar, and sacral  areas avoided HVLA in the thoracic spine today secondary to the pain.  Did more on muscle energy.  Patient tolerated the procedure well with improvement in symptoms  Patient given exercises, stretches and lifestyle modifications  See medications in patient instructions if given  Patient will follow up in 4-8 weeks      The above documentation has been reviewed and is accurate and complete [name], DO       Note: This dictation was prepared with Dragon dictation along with smaller phrase technology. Any transcriptional errors that result from this process are unintentional.

## 2020-04-20 ENCOUNTER — Other Ambulatory Visit: Payer: Self-pay

## 2020-04-20 ENCOUNTER — Ambulatory Visit (INDEPENDENT_AMBULATORY_CARE_PROVIDER_SITE_OTHER): Payer: 59 | Admitting: Family Medicine

## 2020-04-20 ENCOUNTER — Encounter: Payer: Self-pay | Admitting: Family Medicine

## 2020-04-20 VITALS — BP 140/80 | HR 91 | Ht 72.0 in | Wt 277.0 lb

## 2020-04-20 DIAGNOSIS — M999 Biomechanical lesion, unspecified: Secondary | ICD-10-CM | POA: Diagnosis not present

## 2020-04-20 DIAGNOSIS — R0789 Other chest pain: Secondary | ICD-10-CM

## 2020-04-20 DIAGNOSIS — M4200 Juvenile osteochondrosis of spine, site unspecified: Secondary | ICD-10-CM

## 2020-04-20 NOTE — Assessment & Plan Note (Signed)
Patient states that the chest wall pain is significantly improved at this time.  Will discuss with him again now that if any worsening symptoms such as the cough or the radiation through patient does need a CT scan.  Patient declined it again today.  Patient will continue to increase activity that were tolerated.  Follow-up with me again in 3 to 4 weeks.

## 2020-04-20 NOTE — Assessment & Plan Note (Signed)
Known disease.  Acute pain more in the right sacroiliac joint.  Discussed with patient again at great length.  We discussed that we could potentially consider the possibility of repeating imaging but will hold at this time.  He did respond fairly well to osteopathic manipulation.  Does have myofascial pain.  Discussed posture and ergonomics, follow-up again in 3 to 4 weeks

## 2020-04-20 NOTE — Patient Instructions (Addendum)
Good to see you Glad you are doing better If increased chest discomfort call me and we can consider CT scan Have [place] Needle the SI joint See me again as a scheduled

## 2020-04-23 ENCOUNTER — Encounter: Payer: Self-pay | Admitting: Family Medicine

## 2020-04-27 ENCOUNTER — Ambulatory Visit: Payer: 59 | Admitting: Family Medicine

## 2020-04-28 ENCOUNTER — Ambulatory Visit: Payer: 59 | Admitting: Family Medicine

## 2020-05-01 ENCOUNTER — Other Ambulatory Visit: Payer: Self-pay | Admitting: Family Medicine

## 2020-05-06 ENCOUNTER — Other Ambulatory Visit: Payer: Self-pay | Admitting: Family Medicine

## 2020-05-13 ENCOUNTER — Other Ambulatory Visit: Payer: Self-pay

## 2020-05-13 ENCOUNTER — Encounter: Payer: Self-pay | Admitting: Family Medicine

## 2020-05-13 ENCOUNTER — Ambulatory Visit (INDEPENDENT_AMBULATORY_CARE_PROVIDER_SITE_OTHER): Payer: 59 | Admitting: Family Medicine

## 2020-05-13 VITALS — BP 128/88 | HR 91 | Ht 72.0 in | Wt 278.0 lb

## 2020-05-13 DIAGNOSIS — M2550 Pain in unspecified joint: Secondary | ICD-10-CM

## 2020-05-13 DIAGNOSIS — M4200 Juvenile osteochondrosis of spine, site unspecified: Secondary | ICD-10-CM | POA: Diagnosis not present

## 2020-05-13 DIAGNOSIS — M999 Biomechanical lesion, unspecified: Secondary | ICD-10-CM

## 2020-05-13 LAB — COMPREHENSIVE METABOLIC PANEL
ALT: 33 U/L (ref 0–53)
AST: 38 U/L — ABNORMAL HIGH (ref 0–37)
Albumin: 4.6 g/dL (ref 3.5–5.2)
Alkaline Phosphatase: 49 U/L (ref 39–117)
BUN: 17 mg/dL (ref 6–23)
CO2: 25 mEq/L (ref 19–32)
Calcium: 9.7 mg/dL (ref 8.4–10.5)
Chloride: 104 mEq/L (ref 96–112)
Creatinine, Ser: 1.01 mg/dL (ref 0.40–1.50)
GFR: 100.81 mL/min (ref >60.00)
Glucose, Bld: 152 mg/dL — ABNORMAL HIGH (ref 70–99)
Potassium: 4.1 mEq/L (ref 3.5–5.1)
Sodium: 140 mEq/L (ref 135–145)
Total Bilirubin: 1.4 mg/dL — ABNORMAL HIGH (ref 0.2–1.2)
Total Protein: 7.3 g/dL (ref 6.0–8.3)

## 2020-05-13 LAB — CBC WITH DIFFERENTIAL/PLATELET
Basophils Absolute: 0 10*3/uL (ref 0.0–0.1)
Basophils Relative: 0.1 % (ref 0.0–3.0)
Eosinophils Absolute: 0 10*3/uL (ref 0.0–0.7)
Eosinophils Relative: 0.2 % (ref 0.0–5.0)
HCT: 46.4 % (ref 39.0–52.0)
Hemoglobin: 16.3 g/dL (ref 13.0–17.0)
Lymphocytes Relative: 3.9 % — ABNORMAL LOW (ref 12.0–46.0)
Lymphs Abs: 0.4 10*3/uL — ABNORMAL LOW (ref 0.7–4.0)
MCHC: 35.1 g/dL (ref 30.0–36.0)
MCV: 90.5 fl (ref 78.0–100.0)
Monocytes Absolute: 0.4 10*3/uL (ref 0.1–1.0)
Monocytes Relative: 3.4 % (ref 3.0–12.0)
Neutro Abs: 10.3 10*3/uL — ABNORMAL HIGH (ref 1.4–7.7)
Neutrophils Relative %: 92.4 % — ABNORMAL HIGH (ref 43.0–77.0)
Platelets: 161 10*3/uL (ref 150.0–400.0)
RBC: 5.13 Mil/uL (ref 4.22–5.81)
RDW: 13.1 % (ref 11.5–15.5)
WBC: 11.2 10*3/uL — ABNORMAL HIGH (ref 4.0–10.5)

## 2020-05-13 LAB — URIC ACID: Uric Acid, Serum: 6.7 mg/dL (ref 4.0–7.8)

## 2020-05-13 LAB — VITAMIN D 25 HYDROXY (VIT D DEFICIENCY, FRACTURES): VITD: 79.51 ng/mL (ref 30.00–100.00)

## 2020-05-13 LAB — SEDIMENTATION RATE: Sed Rate: 2 mm/hr (ref 0–15)

## 2020-05-13 MED ORDER — METHYLPREDNISOLONE ACETATE 80 MG/ML IJ SUSP
80.0000 mg | Freq: Once | INTRAMUSCULAR | Status: AC
  Administered 2020-05-13: 80 mg via INTRAMUSCULAR

## 2020-05-13 MED ORDER — KETOROLAC TROMETHAMINE 60 MG/2ML IM SOLN
60.0000 mg | Freq: Once | INTRAMUSCULAR | Status: AC
  Administered 2020-05-13: 60 mg via INTRAMUSCULAR

## 2020-05-13 NOTE — Patient Instructions (Signed)
Injections today Possible food poisoning, if vomiting gets worse or ab pain seek medical attention See me at your next appointment

## 2020-05-13 NOTE — Progress Notes (Signed)
[name] [address] [place] [address] Phone: [phone] Subjective:   Frank [name], am serving as a scribe for Dr. Antoine Primas. This visit occurred during the SARS-CoV-2 public health emergency.  Safety protocols were in place, including screening questions prior to the visit, additional usage of staff PPE, and extensive cleaning of exam room while observing appropriate contact time as indicated for disinfecting solutions.   CC: Back and neck pain  WEX:[id number]  Frank [name] is a 30 y.o. male coming in with complaint of back and neck pain. OMT 04/20/2020. Patient states that last night he began having lower back spasms that are radiating up into the shoulders. Patient was vomiting due to pain and got little sleep last night.  Patient denies that he has had any abdominal pain.           Reviewed prior external information including notes and imaging from previsou exam, outside providers and external EMR if available.   As well as notes that were available from care everywhere and other healthcare systems.  Past medical history, social, surgical and family history all reviewed in electronic medical record.  No pertanent information unless stated regarding to the chief complaint.   Past Medical History:  Diagnosis Date  . ADD (attention deficit disorder)   . Anxiety   . Narcolepsy through school    No Known Allergies   Review of Systems:  No headache, visual changes,, diarrhea, constipation, dizziness, abdominal pain, skin rash, fevers, chills, night sweats, weight loss, swollen lymph nodes, body aches, joint swelling, chest pain, shortness of breath, mood changes. POSITIVE muscle aches, body aches, nausea and vomiting  Objective  Blood pressure 128/88, pulse 91, height 6' (1.829 m), weight 278 lb (126.1 kg), SpO2 98 %.   General: No apparent distress alert and oriented x3 mood and affect normal, dressed appropriately.   HEENT: Pupils equal, extraocular movements intact  Respiratory: Patient's speak in full sentences and does not appear short of breath  Cardiovascular: No lower extremity edema, non tender, no erythema  Gait normal with good balance and coordination.  MSK:  Non tender with full range of motion and good stability and symmetric strength and tone of shoulders, elbows, wrist, hip, knee and ankles bilaterally.  Back -low back exam still has significant tightness noted.  Patient does have some paraspinal muscular tenderness noted.  Patient does have some pain in the epigastric region.  Osteopathic findings  C6 flexed rotated and side bent left T3 extended rotated and side bent right inhaled rib T8 extended rotated and side bent left L2 flexed rotated and side bent right Sacrum right on right       Assessment and Plan:  Scheuermann's disease Patient may be having exacerbation but not usually tied to the nausea.  We will get laboratory work-up.  Patient could have more of a food concern for possible poisoning.  No fever patient though does seem to be acutely warm irritated than usual.  Can get repeat x-rays but x-rays are down.  Patient is seeing me again next week has muscle relaxers from Dr.  Injections of Toradol and therefore Medrol given today.  Follow-up again next week    Nonallopathic problems  Decision today to treat with OMT was based on Physical Exam  After verbal consent patient was treated with HVLA, ME, FPR techniques in cervical, rib, thoracic, lumbar, and sacral  areas  Patient tolerated the procedure well with improvement in symptoms  Patient given  exercises, stretches and lifestyle modifications  See medications in patient instructions if given      The above documentation has been reviewed and is accurate and complete Frank Saa, DO       Note: This dictation was prepared with Dragon dictation along with smaller phrase technology. Any transcriptional  errors that result from this process are unintentional.

## 2020-05-13 NOTE — Assessment & Plan Note (Addendum)
Patient may be having exacerbation but not usually tied to the nausea.  We will get laboratory work-up.  Patient could have more of a food concern for possible poisoning.  No fever patient though does seem to be acutely warm irritated than usual.  Can get repeat x-rays but x-rays are down.  Patient is seeing me again next week has muscle relaxers from Dr.  Injections of Toradol and therefore Medrol given today.  Follow-up again next week

## 2020-05-17 NOTE — Progress Notes (Signed)
[name] [address] [place] [address] Phone: [phone] Subjective:   Frank [name], am serving as a scribe for Dr. Antoine Primas. This visit occurred during the SARS-CoV-2 public health emergency.  Safety protocols were in place, including screening questions prior to the visit, additional usage of staff PPE, and extensive cleaning of exam room while observing appropriate contact time as indicated for disinfecting solutions.   I'm seeing this patient by the request  of:  Patient, [name] Per (Inactive)  CC: low back pain   KAJ:[id number]  Frank [name] is a 30 y.o. male coming in with complaint of back and neck pain. OMT 05/13/2020. Patient states that he has returned to his baseline. Improvement since last visit. Feels like his hip and lower back may still be out of place.   Medications patient has been prescribed: Cymbalta, Zanaflex Taking:         Reviewed prior external information including notes and imaging from previsou exam, outside providers and external EMR if available.   As well as notes that were available from care everywhere and other healthcare systems.  Past medical history, social, surgical and family history all reviewed in electronic medical record.  No pertanent information unless stated regarding to the chief complaint.   Past Medical History:  Diagnosis Date  . ADD (attention deficit disorder)   . Anxiety   . Narcolepsy through school    No Known Allergies   Review of Systems:  No headache, visual changes, nausea, vomiting, diarrhea, constipation, dizziness, abdominal pain, skin rash, fevers, chills, night sweats, weight loss, swollen lymph nodes, body aches, joint swelling, chest pain, shortness of breath, mood changes. POSITIVE muscle aches  Objective  Blood pressure (!) 132/98, pulse 92, height 6' (1.829 m), weight 274 lb (124.3 kg), SpO2 99 %.   General: No apparent distress alert and oriented x3  mood and affect normal, dressed appropriately.  HEENT: Pupils equal, extraocular movements intact  Respiratory: Patient's speak in full sentences and does not appear short of breath  Cardiovascular: No lower extremity edema, non tender, no erythema  Gait normal with good balance and coordination.  MSK:  Non tender with full range of motion and good stability and symmetric strength and tone of shoulders, elbows, wrist, hip, knee and ankles bilaterally.  Back -low back exam does have some loss of lordosis.  Tightness noted in the thoracolumbar juncture as well at the sacroiliac joint.  Seems to be bilateral.  Patient does have poor core strength noted.  Osteopathic findings  C6 flexed rotated and side bent left T3 extended rotated and side bent right inhaled rib T9 extended rotated and side bent left L2 flexed rotated and side bent right L5 flexed rotated and side Sacrum right on right       Assessment and Plan:  Scheuermann's disease Chronic, with mild exacerbation.  Continue the duloxetine.  Discussed potential dosing.  Discussed which activities to do which wants to avoid.  Encourage patient to continue to try to do core strengthening to lose weight.  New x-rays ordered today.  Follow-up with me again in 3 weeks.    Nonallopathic problems  Decision today to treat with OMT was based on Physical Exam  After verbal consent patient was treated with HVLA, ME, FPR techniques in cervical, rib, thoracic, lumbar, and sacral  areas  Patient tolerated the procedure well with improvement in symptoms  Patient given exercises, stretches and lifestyle modifications  See medications in patient  instructions if given  Patient will follow up in 3 weeks      The above documentation has been reviewed and is accurate and complete [name], DO       Note: This dictation was prepared with Dragon dictation along with smaller phrase technology. Any transcriptional errors that result  from this process are unintentional.

## 2020-05-18 ENCOUNTER — Ambulatory Visit (INDEPENDENT_AMBULATORY_CARE_PROVIDER_SITE_OTHER): Payer: 59

## 2020-05-18 ENCOUNTER — Encounter: Payer: Self-pay | Admitting: Family Medicine

## 2020-05-18 ENCOUNTER — Other Ambulatory Visit: Payer: Self-pay

## 2020-05-18 ENCOUNTER — Ambulatory Visit (INDEPENDENT_AMBULATORY_CARE_PROVIDER_SITE_OTHER): Payer: 59 | Admitting: Family Medicine

## 2020-05-18 VITALS — BP 132/98 | HR 92 | Ht 72.0 in | Wt 274.0 lb

## 2020-05-18 DIAGNOSIS — M546 Pain in thoracic spine: Secondary | ICD-10-CM | POA: Diagnosis not present

## 2020-05-18 DIAGNOSIS — M4200 Juvenile osteochondrosis of spine, site unspecified: Secondary | ICD-10-CM | POA: Diagnosis not present

## 2020-05-18 DIAGNOSIS — M999 Biomechanical lesion, unspecified: Secondary | ICD-10-CM

## 2020-05-18 NOTE — Patient Instructions (Addendum)
Xray today Focus on band work for the next 2 weeks Ok for dry needling on Thursday See me again in 3 weeks

## 2020-05-18 NOTE — Assessment & Plan Note (Signed)
Chronic, with mild exacerbation.  Continue the duloxetine.  Discussed potential dosing.  Discussed which activities to do which wants to avoid.  Encourage patient to continue to try to do core strengthening to lose weight.  New x-rays ordered today.  Follow-up with me again in 3 weeks.

## 2020-05-29 ENCOUNTER — Other Ambulatory Visit: Payer: Self-pay | Admitting: Family Medicine

## 2020-06-07 NOTE — Progress Notes (Signed)
[name] [address] [place] [address] Phone: [phone] Subjective:   I Frank [name] am serving as a Neurosurgeon for Dr. Antoine Primas.  This visit occurred during the SARS-CoV-2 public health emergency.  Safety protocols were in place, including screening questions prior to the visit, additional usage of staff PPE, and extensive cleaning of exam room while observing appropriate contact time as indicated for disinfecting solutions.   I'm seeing this patient by the request  of:  Patient, [name] Per (Inactive)  CC: Low back pain with radiation  EXH:[id number]  Frank [name] is a 30 y.o. male coming in with complaint of back and neck pain. OMT 05/18/2020. Patient states that he believes he has a pinched nerve. Locked up LE for a few day. States the right side won't relax. IT bands and hamstrings locked up and legs went numb. Pain radiated to left knee causing some pain in the left knee. Overall the pinched part hasn't gone away but he is making some progress.   Medications patient has been prescribed: Trazadone, Pamelor, Cymbalta  Taking:         Reviewed prior external information including notes and imaging from previsou exam, outside providers and external EMR if available.   As well as notes that were available from care everywhere and other healthcare systems.  Past medical history, social, surgical and family history all reviewed in electronic medical record.  No pertanent information unless stated regarding to the chief complaint.   Past Medical History:  Diagnosis Date  . ADD (attention deficit disorder)   . Anxiety   . Narcolepsy through school    No Known Allergies   Review of Systems:  No headache, visual changes, nausea, vomiting, diarrhea, constipation, dizziness, abdominal pain, skin rash, fevers, chills, night sweats, weight loss, swollen lymph nodes, body aches, joint swelling, chest pain, shortness of breath, mood  changes. POSITIVE muscle aches  Objective  Blood pressure 140/90, pulse 90, height 6' (1.829 m), weight 276 lb (125.2 kg), SpO2 98 %.   General: No apparent distress alert and oriented x3 mood and affect normal, dressed appropriately.  HEENT: Pupils equal, extraocular movements intact  Respiratory: Patient's speak in full sentences and does not appear short of breath  Cardiovascular: No lower extremity edema, non tender, no erythema  Neuro: Cranial nerves II through XII are intact, neurovascularly intact in all extremities with 2+ DTRs and 2+ pulses.  Gait normal with good balance and coordination.  MSK:  Non tender with full range of motion and good stability and symmetric strength and tone of shoulders, elbows, wrist, hip, knee and ankles bilaterally.  Back -low back does have some loss of lordosis.  Significant tightness of the hamstrings bilaterally left greater than right.  Positive straight leg test at 25 degrees of forward flexion in the L4 and L5 distribution.  Patient is very uncomfortable after the testing.  Does have tightness noted with FABER test as well.  Neurovascularly intact distally.  Osteopathic findings  C2 flexed rotated and side bent right C6 flexed rotated and side bent left T3 extended rotated and side bent right inhaled rib T9 extended rotated and side bent left        Assessment and Plan:  Acute left lumbar radiculopathy Patient is having acute onset of left lumbar radiculopathy.  This is different than anything patient has had previously.  Significant tightness in the hamstrings bilaterally as well.  Patient has had some mild degenerative disc disease at  L4-L5 that is unfortunately consistent with the possibility of further nerve root impingement.  Patient has had chronic pain for quite some time and does have chronic myofascial pain that does make the diagnosis somewhat difficult.  Will consider advanced imaging at this point where patient could be a candidate  for possible epidurals.  Patient has had a longstanding history of this back pain with this as well as she should remove this disease.  Some mild increase in tightness at the thoracolumbar juncture so we will add the thoracic MRI as well.  Continue the same medications but given a short course of prednisone as well.  Follow-up with me again after imaging to discuss    Nonallopathic problems  Decision today to treat with OMT was based on Physical Exam  After verbal consent patient was treated with HVLA, ME, FPR techniques in cervical, rib, thoracic,areas avoided the lower back secondary to patient's radicular symptoms today.  Patient tolerated the procedure well with improvement in symptoms  Patient given exercises, stretches and lifestyle modifications  See medications in patient instructions if given  Patient will follow up after imaging.      The above documentation has been reviewed and is accurate and complete [name], DO       Note: This dictation was prepared with Dragon dictation along with smaller phrase technology. Any transcriptional errors that result from this process are unintentional.

## 2020-06-08 ENCOUNTER — Other Ambulatory Visit: Payer: Self-pay

## 2020-06-08 ENCOUNTER — Ambulatory Visit (INDEPENDENT_AMBULATORY_CARE_PROVIDER_SITE_OTHER): Payer: 59

## 2020-06-08 ENCOUNTER — Encounter: Payer: Self-pay | Admitting: Family Medicine

## 2020-06-08 ENCOUNTER — Ambulatory Visit (INDEPENDENT_AMBULATORY_CARE_PROVIDER_SITE_OTHER): Payer: 59 | Admitting: Family Medicine

## 2020-06-08 VITALS — BP 140/90 | HR 90 | Ht 72.0 in | Wt 276.0 lb

## 2020-06-08 DIAGNOSIS — G8929 Other chronic pain: Secondary | ICD-10-CM

## 2020-06-08 DIAGNOSIS — M5416 Radiculopathy, lumbar region: Secondary | ICD-10-CM

## 2020-06-08 DIAGNOSIS — M9901 Segmental and somatic dysfunction of cervical region: Secondary | ICD-10-CM | POA: Diagnosis not present

## 2020-06-08 DIAGNOSIS — M545 Low back pain, unspecified: Secondary | ICD-10-CM

## 2020-06-08 DIAGNOSIS — M546 Pain in thoracic spine: Secondary | ICD-10-CM

## 2020-06-08 DIAGNOSIS — M9908 Segmental and somatic dysfunction of rib cage: Secondary | ICD-10-CM

## 2020-06-08 DIAGNOSIS — M4200 Juvenile osteochondrosis of spine, site unspecified: Secondary | ICD-10-CM | POA: Diagnosis not present

## 2020-06-08 DIAGNOSIS — M9902 Segmental and somatic dysfunction of thoracic region: Secondary | ICD-10-CM | POA: Diagnosis not present

## 2020-06-08 MED ORDER — PREDNISONE 20 MG PO TABS: 20.0000 mg | ORAL_TABLET | Freq: Every day | ORAL | 0 refills | Status: DC

## 2020-06-08 NOTE — Assessment & Plan Note (Signed)
Patient is having acute onset of left lumbar radiculopathy.  This is different than anything patient has had previously.  Significant tightness in the hamstrings bilaterally as well.  Patient has had some mild degenerative disc disease at L4-L5 that is unfortunately consistent with the possibility of further nerve root impingement.  Patient has had chronic pain for quite some time and does have chronic myofascial pain that does make the diagnosis somewhat difficult.  Will consider advanced imaging at this point where patient could be a candidate for possible epidurals.  Patient has had a longstanding history of this back pain with this as well as she should remove this disease.  Some mild increase in tightness at the thoracolumbar juncture so we will add the thoracic MRI as well.  Continue the same medications but given a short course of prednisone as well.  Follow-up with me again after imaging to discuss

## 2020-06-08 NOTE — Patient Instructions (Addendum)
Good to see you Xray today 20 mg prednisone for 5 days no other inflammatories MRI thoracic and lumbar See me again in 4-6 weeks

## 2020-06-20 ENCOUNTER — Ambulatory Visit
Admission: RE | Admit: 2020-06-20 | Discharge: 2020-06-20 | Disposition: A | Discharge status: Home / Self Care | Payer: 59 | Source: Ambulatory Visit | Attending: Family Medicine | Admitting: Family Medicine

## 2020-06-20 ENCOUNTER — Other Ambulatory Visit: Payer: Self-pay | Admitting: Family Medicine

## 2020-06-20 ENCOUNTER — Other Ambulatory Visit: Payer: Self-pay

## 2020-06-20 DIAGNOSIS — G8929 Other chronic pain: Secondary | ICD-10-CM

## 2020-06-20 DIAGNOSIS — M545 Low back pain, unspecified: Secondary | ICD-10-CM

## 2020-06-20 DIAGNOSIS — M546 Pain in thoracic spine: Secondary | ICD-10-CM

## 2020-06-23 NOTE — Progress Notes (Signed)
[name] [address] [place] [address] Phone: [phone] Subjective:   I [name] am serving as a Neurosurgeon for Dr. Antoine Primas.  This visit occurred during the SARS-CoV-2 public health emergency.  Safety protocols were in place, including screening questions prior to the visit, additional usage of staff PPE, and extensive cleaning of exam room while observing appropriate contact time as indicated for disinfecting solutions.   I'm seeing this patient by the request  of:  Patient, [name] Per (Inactive)  CC: Back and neck pain follow-up  [id number]  JAYLENE SCHROM is a 30 y.o. male coming in with complaint of back and neck pain. OMT 06/08/2020. Patient states he would like to talk about next steps. States he has shooting sharp pain down the left arm and leg. Has been making progress but at times the pain gets worse.  Patient did have an MRI of the lumbar spine very mild disc protrusion noted at L4-L5 that would be consistent with patient's findings.  This was independently visualized by me today.  Patient did not have any true nerve root impingement bone noted on the MRI.  Continues to work out with his trainer and pain as well as physical therapist  Medications patient has been prescribed: Cymbalta, Zanaflex  Taking: Yes         Reviewed prior external information including notes and imaging from previsou exam, outside providers and external EMR if available.   As well as notes that were available from care everywhere and other healthcare systems.  Past medical history, social, surgical and family history all reviewed in electronic medical record.  No pertanent information unless stated regarding to the chief complaint.   Past Medical History:  Diagnosis Date  . ADD (attention deficit disorder)   . Anxiety   . Narcolepsy through school    No Known Allergies   Review of Systems:  No headache, visual changes, nausea,  vomiting, diarrhea, constipation, dizziness, abdominal pain, skin rash, fevers, chills, night sweats, weight loss, swollen lymph nodes, , joint swelling, chest pain, shortness of breath, mood changes. POSITIVE muscle aches, body aches with radicular symptoms  Objective  Blood pressure (!) 138/92, pulse 91, height 6' (1.829 m), weight 274 lb (124.3 kg), SpO2 98 %.   General: No apparent distress alert and oriented x3 mood and affect normal, dressed appropriately.  HEENT: Pupils equal, extraocular movements intact  Respiratory: Patient's speak in full sentences and does not appear short of breath  Cardiovascular: No lower extremity edema, non tender, no erythema  Gait normal with good balance and coordination.  MSK: Left shoulder still has impingement noted.  Poor core strength noted Back -low back exam does have some tightness noted in the L4-L5 region as well as the L5-S1 region.  Seems to be more left greater than right.  Tightness with left patient will does have 5 out of 5 strength of the lower extremities noted.  Tightness with FABER test bilaterally.  Osteopathic findings  C2 flexed rotated and side bent right C6 flexed rotated and side bent left T3 extended rotated and side bent right inhaled rib T9 extended rotated and side bent left L2 flexed rotated and side bent right L4 flexed rotated and side bent left sacrum right on right       Assessment and Plan: Acute left lumbar radiculopathy Patient had MRI showing L4-L5 disc protrusion but no true nerve impingement.  Continues to have symptoms that are consistent with this level.  We will order an epidural.  If patient continues to make any improvement we will not need to make any significant changes.  Patient will give an update in the next 72 hours.  Discussed icing regimen and exercises.  Increase activity slowly.  Discussed icing regimen.  Follow-up again in 4 weeks  Left shoulder pain Continues to have some mild impingement.   Patient declined any injection.  Feels like he is making some progress.    Nonallopathic problems  Decision today to treat with OMT was based on Physical Exam  After verbal consent patient was treated with HVLA, ME, FPR techniques in cervical, rib, thoracic, lumbar, and sacral  areas  Patient tolerated the procedure well with improvement in symptoms  Patient given exercises, stretches and lifestyle modifications  See medications in patient instructions if given  Patient will follow up in  weeks      The above documentation has been reviewed and is accurate and complete [name], DO       Note: This dictation was prepared with Dragon dictation along with smaller phrase technology. Any transcriptional errors that result from this process are unintentional.

## 2020-07-01 ENCOUNTER — Encounter: Payer: Self-pay | Admitting: Family Medicine

## 2020-07-01 ENCOUNTER — Other Ambulatory Visit: Payer: Self-pay

## 2020-07-01 ENCOUNTER — Ambulatory Visit (INDEPENDENT_AMBULATORY_CARE_PROVIDER_SITE_OTHER): Payer: 59 | Admitting: Family Medicine

## 2020-07-01 VITALS — BP 138/92 | HR 91 | Ht 72.0 in | Wt 274.0 lb

## 2020-07-01 DIAGNOSIS — M9904 Segmental and somatic dysfunction of sacral region: Secondary | ICD-10-CM

## 2020-07-01 DIAGNOSIS — M5416 Radiculopathy, lumbar region: Secondary | ICD-10-CM

## 2020-07-01 DIAGNOSIS — M9901 Segmental and somatic dysfunction of cervical region: Secondary | ICD-10-CM

## 2020-07-01 DIAGNOSIS — M25512 Pain in left shoulder: Secondary | ICD-10-CM

## 2020-07-01 DIAGNOSIS — M9908 Segmental and somatic dysfunction of rib cage: Secondary | ICD-10-CM | POA: Diagnosis not present

## 2020-07-01 DIAGNOSIS — M545 Low back pain, unspecified: Secondary | ICD-10-CM | POA: Diagnosis not present

## 2020-07-01 DIAGNOSIS — M9903 Segmental and somatic dysfunction of lumbar region: Secondary | ICD-10-CM | POA: Diagnosis not present

## 2020-07-01 DIAGNOSIS — G8929 Other chronic pain: Secondary | ICD-10-CM

## 2020-07-01 DIAGNOSIS — M9902 Segmental and somatic dysfunction of thoracic region: Secondary | ICD-10-CM

## 2020-07-01 NOTE — Patient Instructions (Addendum)
Good to see you When you see the endocrinologist see if they can check vitamin D, ESR, Testosterone Epidural L4-L5 [facility] will call you Frank [name] is your friend Send me a message on Monday [place] no repetitive flexion of the back See me again in 6-8 weeks

## 2020-07-01 NOTE — Assessment & Plan Note (Signed)
Patient had MRI showing L4-L5 disc protrusion but no true nerve impingement.  Continues to have symptoms that are consistent with this level.  We will order an epidural.  If patient continues to make any improvement we will not need to make any significant changes.  Patient will give an update in the next 72 hours.  Discussed icing regimen and exercises.  Increase activity slowly.  Discussed icing regimen.  Follow-up again in 4 weeks

## 2020-07-01 NOTE — Assessment & Plan Note (Signed)
Continues to have some mild impingement.  Patient declined any injection.  Feels like he is making some progress.

## 2020-07-08 ENCOUNTER — Other Ambulatory Visit: Payer: Self-pay

## 2020-07-08 ENCOUNTER — Ambulatory Visit
Admission: RE | Admit: 2020-07-08 | Discharge: 2020-07-08 | Disposition: A | Discharge status: Home / Self Care | Payer: 59 | Source: Ambulatory Visit | Attending: Family Medicine | Admitting: Family Medicine

## 2020-07-08 DIAGNOSIS — M545 Low back pain, unspecified: Secondary | ICD-10-CM

## 2020-07-08 MED ORDER — METHYLPREDNISOLONE ACETATE 40 MG/ML INJ SUSP (RADIOLOG
80.0000 mg | Freq: Once | INTRAMUSCULAR | Status: AC
  Administered 2020-07-08: 80 mg via EPIDURAL

## 2020-07-08 MED ORDER — IOPAMIDOL (ISOVUE-M 200) INJECTION 41%
1.0000 mL | Freq: Once | INTRAMUSCULAR | Status: AC
  Administered 2020-07-08: 1 mL via EPIDURAL

## 2020-07-08 NOTE — Discharge Instructions (Signed)
Post Procedure Spinal Discharge Instruction Sheet  1. You may resume a regular diet and any medications that you routinely take (including pain medications).  2. No driving day of procedure.  3. Light activity throughout the rest of the day.  Do not do any strenuous work, exercise, bending or lifting.  The day following the procedure, you can resume normal physical activity but you should refrain from exercising or physical therapy for at least three days thereafter.   Common Side Effects:   Headaches- take your usual medications as directed by your physician.  Increase your fluid intake.  Caffeinated beverages may be helpful.  Lie flat in bed until your headache resolves.   Restlessness or inability to sleep- you may have trouble sleeping for the next few days.  Ask your referring physician if you need any medication for sleep.   Facial flushing or redness- should subside within a few days.   Increased pain- a temporary increase in pain a day or two following your procedure is not unusual.  Take your pain medication as prescribed by your referring physician.   Leg cramps  Please contact our office at [phone] for the following symptoms:  Fever greater than 100 degrees.  Headaches unresolved with medication after 2-3 days.  Increased swelling, pain, or redness at injection site.   Thank you for visiting [facility] today.  

## 2020-07-09 ENCOUNTER — Other Ambulatory Visit: Payer: Self-pay | Admitting: Family Medicine

## 2020-07-21 NOTE — Progress Notes (Signed)
[name] [address] [place] [address] Phone: [phone] Subjective:   I Frank [name] am serving as a Neurosurgeon for Dr. Antoine Primas.  This visit occurred during the SARS-CoV-2 public health emergency.  Safety protocols were in place, including screening questions prior to the visit, additional usage of staff PPE, and extensive cleaning of exam room while observing appropriate contact time as indicated for disinfecting solutions.   I'm seeing this patient by the request  of:  Patient, [name] Per (Inactive)  CC: Back pain follow-up  [id number]  Frank [name] is a 30 y.o. male coming in with complaint of back and neck pain. OMT 07/01/2020. Patient states he is doing better. Epidural helped a lot. States the first 2 days were rough. States he feels like he has more energy and has been working out more frequently. Still some pain in the left hip that radiates to the left leg but is significantly better. We will Patient states that the last 2 weeks has been the best he has felt in years.  Starting to have though some mild increase in theLeft hip pain again.  This makes patient somewhat concerned.    Medications patient has been prescribed: Cymbalta, Zanaflex         Reviewed prior external information including notes and imaging from previsou exam, outside providers and external EMR if available.   As well as notes that were available from care everywhere and other healthcare systems.  Past medical history, social, surgical and family history all reviewed in electronic medical record.  No pertanent information unless stated regarding to the chief complaint.   Past Medical History:  Diagnosis Date   ADD (attention deficit disorder)    Anxiety    Narcolepsy through school    No Known Allergies   Review of Systems:  No headache, visual changes, nausea, vomiting, diarrhea, constipation, dizziness, abdominal pain, skin rash, fevers,  chills, night sweats, weight loss, swollen lymph nodes, body aches, joint swelling, chest pain, shortness of breath, mood changes. POSITIVE muscle aches  Objective  Blood pressure 130/90, pulse 92, height 6' (1.829 m), weight 272 lb (123.4 kg), SpO2 97 %.   General: No apparent distress alert and oriented x3 mood and affect normal, dressed appropriately.  HEENT: Pupils equal, extraocular movements intact  Respiratory: Patient's speak in full sentences and does not appear short of breath  Cardiovascular: No lower extremity edema, non tender, no erythema   Osteopathic findings  C3 flexed rotated and side bent left T5 extended rotated and side bent left inhaled rib  L1 flexed rotated and side bent right L4 flexed rotated and side bent left  Sacrum right on right       Assessment and Plan:  Acute left lumbar radiculopathy Patient responded extremely well to an epidural at the L4-L5 level on the left side.  I would like to repeat 1 more time with patient having very small amount coming back at this moment of the pain.  Continue with the Cymbalta.  Patient will continue the other medications.  Very happy with the result so far.  Patient will follow up again in 6 weeks   Nonallopathic problems  Decision today to treat with OMT was based on Physical Exam  After verbal consent patient was treated with HVLA, ME, FPR techniques in cervical, rib, thoracic, lumbar, and sacral  areas  Patient tolerated the procedure well with improvement in symptoms  Patient given exercises, stretches and lifestyle modifications  See medications in patient instructions if given  Patient will follow up in 4-8 weeks      The above documentation has been reviewed and is accurate and complete Frank Pulley, DO        Note: This dictation was prepared with Dragon dictation along with smaller phrase technology. Any transcriptional errors that result from this process are unintentional.

## 2020-07-22 ENCOUNTER — Other Ambulatory Visit: Payer: Self-pay

## 2020-07-22 ENCOUNTER — Ambulatory Visit (INDEPENDENT_AMBULATORY_CARE_PROVIDER_SITE_OTHER): Payer: 59 | Admitting: Family Medicine

## 2020-07-22 ENCOUNTER — Encounter: Payer: Self-pay | Admitting: Family Medicine

## 2020-07-22 VITALS — BP 130/90 | HR 92 | Ht 72.0 in | Wt 272.0 lb

## 2020-07-22 DIAGNOSIS — M9902 Segmental and somatic dysfunction of thoracic region: Secondary | ICD-10-CM | POA: Diagnosis not present

## 2020-07-22 DIAGNOSIS — M9908 Segmental and somatic dysfunction of rib cage: Secondary | ICD-10-CM

## 2020-07-22 DIAGNOSIS — M5416 Radiculopathy, lumbar region: Secondary | ICD-10-CM

## 2020-07-22 DIAGNOSIS — M9904 Segmental and somatic dysfunction of sacral region: Secondary | ICD-10-CM | POA: Diagnosis not present

## 2020-07-22 DIAGNOSIS — M9901 Segmental and somatic dysfunction of cervical region: Secondary | ICD-10-CM

## 2020-07-22 DIAGNOSIS — M9903 Segmental and somatic dysfunction of lumbar region: Secondary | ICD-10-CM

## 2020-07-22 NOTE — Patient Instructions (Addendum)
Good to see you Ordered epidural Glad you are doing better Stay active  Listen to your body Heel lift for the shoes to help with pain See me again in 6-7 weeks

## 2020-07-22 NOTE — Assessment & Plan Note (Signed)
Patient responded extremely well to an epidural at the L4-L5 level on the left side.  I would like to repeat 1 more time with patient having very small amount coming back at this moment of the pain.  Continue with the Cymbalta.  Patient will continue the other medications.  Very happy with the result so far.  Patient will follow up again in 6 weeks

## 2020-07-27 ENCOUNTER — Other Ambulatory Visit: Payer: Self-pay | Admitting: Family Medicine

## 2020-07-27 ENCOUNTER — Ambulatory Visit
Admission: RE | Admit: 2020-07-27 | Discharge: 2020-07-27 | Disposition: A | Discharge status: Home / Self Care | Payer: 59 | Source: Ambulatory Visit | Attending: Family Medicine | Admitting: Family Medicine

## 2020-07-27 ENCOUNTER — Other Ambulatory Visit: Payer: Self-pay

## 2020-07-27 DIAGNOSIS — M9903 Segmental and somatic dysfunction of lumbar region: Secondary | ICD-10-CM

## 2020-07-27 DIAGNOSIS — M5416 Radiculopathy, lumbar region: Secondary | ICD-10-CM

## 2020-07-27 MED ORDER — METHYLPREDNISOLONE ACETATE 40 MG/ML INJ SUSP (RADIOLOG
90.0000 mg | Freq: Once | INTRAMUSCULAR | Status: AC
  Administered 2020-07-27: 90 mg via EPIDURAL

## 2020-07-27 MED ORDER — IOPAMIDOL (ISOVUE-M 200) INJECTION 41%
1.0000 mL | Freq: Once | INTRAMUSCULAR | Status: AC
  Administered 2020-07-27: 1 mL via EPIDURAL

## 2020-07-27 NOTE — Discharge Instructions (Signed)
Post Procedure Spinal Discharge Instruction Sheet  You may resume a regular diet and any medications that you routinely take (including pain medications).  No driving day of procedure.  Light activity throughout the rest of the day.  Do not do any strenuous work, exercise, bending or lifting.  The day following the procedure, you can resume normal physical activity but you should refrain from exercising or physical therapy for at least three days thereafter.   Common Side Effects:  Headaches- take your usual medications as directed by your physician.  Increase your fluid intake.  Caffeinated beverages may be helpful.  Lie flat in bed until your headache resolves.  Restlessness or inability to sleep- you may have trouble sleeping for the next few days.  Ask your referring physician if you need any medication for sleep.  Facial flushing or redness- should subside within a few days.  Increased pain- a temporary increase in pain a day or two following your procedure is not unusual.  Take your pain medication as prescribed by your referring physician.  Leg cramps  Please contact our office at [phone] for the following symptoms: Fever greater than 100 degrees. Headaches unresolved with medication after 2-3 days. Increased swelling, pain, or redness at injection site.   Thank you for visiting [facility] today.   

## 2020-07-30 ENCOUNTER — Other Ambulatory Visit: Payer: 59

## 2020-08-09 ENCOUNTER — Other Ambulatory Visit: Payer: Self-pay | Admitting: Family Medicine

## 2020-08-11 NOTE — Progress Notes (Signed)
  [name] [address] [place] [address] Phone: [phone] Subjective:   Frank [name], am serving as a scribe for Dr. Antoine Primas.  I'm seeing this patient by the request  of:  Patient, [name] Per (Inactive)  CC: Low back pain follow-up  [id number]  Frank [name] is a 30 y.o. male coming in with complaint of back and neck pain. OMT 07/22/2020. Patient states low back upset since epidural. Nerves down both legs will not calm down even when resting. Not sure if its from his back or that his legs are just really tight. Moving and functioning feels better but sitting or laying is when the issues occur.  Medications patient has been prescribed: Cymbalta, Zanaflex, Prednisone  Taking: Cymbalta and Zanaflex        Past Medical History:  Diagnosis Date   ADD (attention deficit disorder)    Anxiety    Narcolepsy through school    No Known Allergies   Review of Systems:  No headache, visual changes, nausea, vomiting, diarrhea, constipation, dizziness, abdominal pain, skin rash, fevers, chills, night sweats, weight loss, swollen lymph nodes, body aches, joint swelling, chest pain, shortness of breath, mood changes. POSITIVE muscle aches  Objective  Blood pressure 120/82, pulse 86, height 6' (1.829 m), weight 269 lb (122 kg), SpO2 98 %.   General: No apparent distress alert and oriented x3 mood and affect normal, dressed appropriately.  HEENT: Pupils equal, extraocular movements intact  Respiratory: Patient's speak in full sentences and does not appear short of breath  Cardiovascular: No lower extremity edema, non tender, no erythema  Patient's low back and mid back only has significant tightness noted.  Not quite as severe as patient has had in the past.  Patient does have some mild tightness of the left side since FABER test.  Neurovascularly intact distally.  5 out of 5 strength in lower extremities.  Tightness noted of the left  parascapular region.   Osteopathic findings  C2 flexed rotated and side bent right C7 flexed rotated and side bent left T3 extended rotated and side bent left  inhaled rib L2 flexed rotated and side bent right Sacrum left on left   Foot exam shows the patient does have breakdown of the longitudinal arch minorly.  Some mild overpronation of the hindfoot.     Assessment and Plan:  Acute left lumbar radiculopathy Patient is usually feeling better after dialysis.  Muscle tightness, discussed HEP.  Discussed which activities to do  Making progress with the injection.  RTC in 6-8 weeks    Nonallopathic problems  Decision today to treat with OMT was based on Physical Exam  After verbal consent patient was treated with HVLA, ME, FPR techniques in cervical, rib, thoracic, lumbar, and sacral  areas  Patient tolerated the procedure well with improvement in symptoms  Patient given exercises, stretches and lifestyle modifications  See medications in patient instructions if given  Patient will follow up in 4-8 weeks     The above documentation has been reviewed and is accurate and complete [name], DO        Note: This dictation was prepared with Dragon dictation along with smaller phrase technology. Any transcriptional errors that result from this process are unintentional.

## 2020-08-12 ENCOUNTER — Ambulatory Visit (INDEPENDENT_AMBULATORY_CARE_PROVIDER_SITE_OTHER): Payer: 59 | Admitting: Family Medicine

## 2020-08-12 ENCOUNTER — Other Ambulatory Visit: Payer: Self-pay

## 2020-08-12 ENCOUNTER — Encounter: Payer: Self-pay | Admitting: Family Medicine

## 2020-08-12 VITALS — BP 120/82 | HR 86 | Ht 72.0 in | Wt 269.0 lb

## 2020-08-12 DIAGNOSIS — M9903 Segmental and somatic dysfunction of lumbar region: Secondary | ICD-10-CM

## 2020-08-12 DIAGNOSIS — M9902 Segmental and somatic dysfunction of thoracic region: Secondary | ICD-10-CM

## 2020-08-12 DIAGNOSIS — M9908 Segmental and somatic dysfunction of rib cage: Secondary | ICD-10-CM

## 2020-08-12 DIAGNOSIS — M9901 Segmental and somatic dysfunction of cervical region: Secondary | ICD-10-CM

## 2020-08-12 DIAGNOSIS — M79671 Pain in right foot: Secondary | ICD-10-CM

## 2020-08-12 DIAGNOSIS — M5416 Radiculopathy, lumbar region: Secondary | ICD-10-CM | POA: Diagnosis not present

## 2020-08-12 DIAGNOSIS — M9904 Segmental and somatic dysfunction of sacral region: Secondary | ICD-10-CM | POA: Diagnosis not present

## 2020-08-12 DIAGNOSIS — G5761 Lesion of plantar nerve, right lower limb: Secondary | ICD-10-CM

## 2020-08-12 DIAGNOSIS — M79672 Pain in left foot: Secondary | ICD-10-CM

## 2020-08-12 NOTE — Patient Instructions (Addendum)
Good to see you  Referring you for orthotics  I think we are making good strides See me again as scheduled

## 2020-08-12 NOTE — Assessment & Plan Note (Signed)
Patient has had a neuroma previously.  Does have some breakdown of the longitudinal arch.  Will be sent for custom orthotics that I think will be beneficial.

## 2020-08-12 NOTE — Assessment & Plan Note (Addendum)
Patient is usually feeling better after dialysis.  Muscle tightness, discussed HEP.  Discussed which activities to do  Making progress with the injection.  RTC in 6-8 weeks

## 2020-08-31 ENCOUNTER — Other Ambulatory Visit: Payer: Self-pay

## 2020-08-31 ENCOUNTER — Encounter: Payer: Self-pay | Admitting: Family Medicine

## 2020-08-31 ENCOUNTER — Ambulatory Visit (INDEPENDENT_AMBULATORY_CARE_PROVIDER_SITE_OTHER): Payer: 59 | Admitting: Family Medicine

## 2020-08-31 DIAGNOSIS — M5416 Radiculopathy, lumbar region: Secondary | ICD-10-CM

## 2020-08-31 NOTE — Progress Notes (Signed)
Frank [name] - 30 y.o. male MRN [id number]  Date of birth: [birth year]  SUBJECTIVE:  Including CC & ROS.  No chief complaint on file.   Frank [name] is a 30 y.o. male that is presenting with foot pain.  Is acute on chronic in nature.      Review of Systems See HPI   HISTORY: Past Medical, Surgical, Social, and Family History Reviewed & Updated per EMR.   Pertinent Historical Findings include:  Past Medical History:  Diagnosis Date   ADD (attention deficit disorder)    Anxiety    Narcolepsy through school    Past Surgical History:  Procedure Laterality Date   KNEE SURGERY     WISDOM TOOTH EXTRACTION  2013    Family History  Problem Relation Age of Onset   Stroke Maternal Grandmother    Cancer Paternal Grandmother        Breast   Hypertension Paternal Grandmother    Diabetes Paternal Grandfather    Prostate cancer Father        and grandfather   Other Neg Hx     Social History   Socioeconomic History   Marital status: Single    Spouse name: Not on file   Number of children: 0   Years of education: Not on file   Highest education level: Bachelor's degree (e.g., BA, AB, BS)  Occupational History    Employer: [employer]  Tobacco Use   Smoking status: Never   Smokeless tobacco: Never  Substance and Sexual Activity   Alcohol use: Yes    Alcohol/week: 12.0 standard drinks    Types: 5 Shots of liquor, 7 Standard drinks or equivalent per week    Comment: Mostly just weekends   Drug use: No   Sexual activity: Yes    Birth control/protection: Condom  Other Topics Concern   Not on file  Social History Narrative   Caffeine 4-5 cups a day. Decreasing adderall has increased need for coffee.   Moved back in with his parents from [place], [place] in 02/2017 to address his chronic pain and disability more aggressively (leave of absence from work).   Social Determinants of Health   Financial Resource Strain: Not on file  Food Insecurity: Not on file  Transportation  Needs: Not on file  Physical Activity: Not on file  Stress: Not on file  Social Connections: Not on file  Intimate Partner Violence: Not on file     PHYSICAL EXAM:  VS: Ht 6' (1.829 m)   Wt 269 lb (122 kg)   BMI 36.48 kg/m  Physical Exam Gen: NAD, alert, cooperative with exam, well-appearing   Patient was fitted for a standard, cushioned, semi-rigid orthotic. The orthotic was heated and afterward the patient stood on the orthotic blank positioned on the orthotic stand. The patient was positioned in subtalar neutral position and 10 degrees of ankle dorsiflexion in a weight bearing stance. After completion of molding, a stable base was applied to the orthotic blank. The blank was ground to a stable position for weight bearing. Size: 11 Pairs: 2 Base: Blue EVA Additional Posting and Padding: 5th ray post b/l The patient ambulated these, and they were very comfortable.    ASSESSMENT & PLAN:   Acute left lumbar radiculopathy Acute on chronic in nature.  Seems to be having radicular pain down to his feet.  Has a long history of back issues -Counseled on home exercise therapy and supportive therapy -Orthotics with fifth ray post. -Could consider additional padding  if needed.

## 2020-08-31 NOTE — Progress Notes (Signed)
[name] [address] [place] [address] Phone: [phone] Subjective:   I Frank [name] am serving as a Neurosurgeon for Dr. Antoine [name].  This visit occurred during the SARS-CoV-2 public health emergency.  Safety protocols were in place, including screening questions prior to the visit, additional usage of staff PPE, and extensive cleaning of exam room while observing appropriate contact time as indicated for disinfecting solutions.   I'm seeing this patient by the request  of:  Patient, [name] Per (Inactive)  CC: Low back pain, neck pain  [id number]  Frank [name] is a 30 y.o. male coming in with complaint of back and neck pain. OMT 08/12/2020. Patient states he is sore. Has been working out often. Low back is painful with nerve pain in the legs. Doesn't think he is getting worse and making slow progress.  Medications patient has been prescribed: Cymbalta, Zanaflex         Past Medical History:  Diagnosis Date   ADD (attention deficit disorder)    Anxiety    Narcolepsy through school    No Known Allergies   Review of Systems:  No headache, visual changes, nausea, vomiting, diarrhea, constipation, dizziness, abdominal pain, skin rash, fevers, chills, night sweats, weight loss, swollen lymph nodes, joint swelling, chest pain, shortness of breath, mood changes. POSITIVE muscle aches, body aches  Objective  Blood pressure 132/90, pulse 82, height 6' (1.829 m), weight 262 lb (118.8 kg), SpO2 98 %.   General: No apparent distress alert and oriented x3 mood and affect normal, dressed appropriately.  HEENT: Pupils equal, extraocular movements intact  Respiratory: Patient's speak in full sentences and does not appear short of breath  Cardiovascular: No lower extremity edema, non tender, no erythema  Lower back exam does have some mild loss of lordosis.  Tightness noted in the paraspinal musculature left greater than right.  Tightness  with straight leg test left greater than right but no true radicular symptoms noted at this time.  Tightness with FABER test bilaterally.  Osteopathic findings  C2 flexed rotated and side bent right C6 flexed rotated and side bent left T3 extended rotated and side bent right inhaled rib T9 extended rotated and side bent left L2 flexed rotated and side bent right Sacrum right on right       Assessment and Plan:  Acute left lumbar radiculopathy Patient is still to have intermittent radicular symptoms down the left leg.  We discussed with patient that we do feel that the MRI does have a nerve root impingement and patient has responded fairly well to the injections previously.  Patient of course wants to avoid any surgical intervention.  Secondary to patient's age I agree with that as long as possible.  No weakness noted today.  Responding fairly well to manipulation.  Follow-up again 6 to 8 weeks   Nonallopathic problems  Decision today to treat with OMT was based on Physical Exam  After verbal consent patient was treated with HVLA, ME, FPR techniques in cervical, rib, thoracic, lumbar, and sacral  areas  Patient tolerated the procedure well with improvement in symptoms  Patient given exercises, stretches and lifestyle modifications  See medications in patient instructions if given  Patient will follow up in 4-8 weeks      The above documentation has been reviewed and is accurate and complete Frank Saa, DO       Note: This dictation was prepared with Dragon dictation along with smaller phrase  technology. Any transcriptional errors that result from this process are unintentional.

## 2020-08-31 NOTE — Assessment & Plan Note (Signed)
Acute on chronic in nature.  Seems to be having radicular pain down to his feet.  Has a long history of back issues -Counseled on home exercise therapy and supportive therapy -Orthotics with fifth ray post. -Could consider additional padding if needed.

## 2020-09-02 ENCOUNTER — Other Ambulatory Visit: Payer: Self-pay

## 2020-09-02 ENCOUNTER — Ambulatory Visit (INDEPENDENT_AMBULATORY_CARE_PROVIDER_SITE_OTHER): Payer: 59 | Admitting: Family Medicine

## 2020-09-02 ENCOUNTER — Encounter: Payer: Self-pay | Admitting: Family Medicine

## 2020-09-02 VITALS — BP 132/90 | HR 82 | Ht 72.0 in | Wt 262.0 lb

## 2020-09-02 DIAGNOSIS — M4200 Juvenile osteochondrosis of spine, site unspecified: Secondary | ICD-10-CM

## 2020-09-02 DIAGNOSIS — M9904 Segmental and somatic dysfunction of sacral region: Secondary | ICD-10-CM

## 2020-09-02 DIAGNOSIS — M9903 Segmental and somatic dysfunction of lumbar region: Secondary | ICD-10-CM

## 2020-09-02 DIAGNOSIS — M9901 Segmental and somatic dysfunction of cervical region: Secondary | ICD-10-CM | POA: Diagnosis not present

## 2020-09-02 DIAGNOSIS — M5416 Radiculopathy, lumbar region: Secondary | ICD-10-CM

## 2020-09-02 DIAGNOSIS — M9908 Segmental and somatic dysfunction of rib cage: Secondary | ICD-10-CM

## 2020-09-02 DIAGNOSIS — M9902 Segmental and somatic dysfunction of thoracic region: Secondary | ICD-10-CM

## 2020-09-02 NOTE — Assessment & Plan Note (Signed)
Patient is still to have intermittent radicular symptoms down the left leg.  We discussed with patient that we do feel that the MRI does have a nerve root impingement and patient has responded fairly well to the injections previously.  Patient of course wants to avoid any surgical intervention.  Secondary to patient's age I agree with that as long as possible.  No weakness noted today.  Responding fairly well to manipulation.  Follow-up again 6 to 8 weeks

## 2020-09-02 NOTE — Patient Instructions (Signed)
Good to see you [facility]  See me again in 4-6 weeks

## 2020-09-14 ENCOUNTER — Other Ambulatory Visit: Payer: Self-pay | Admitting: Family Medicine

## 2020-09-23 ENCOUNTER — Other Ambulatory Visit: Payer: Self-pay

## 2020-09-23 ENCOUNTER — Ambulatory Visit: Payer: 59 | Admitting: Family Medicine

## 2020-09-23 NOTE — Progress Notes (Signed)
[name] [address] [place] [address] Phone: [phone] Subjective:   Frank [name], am serving as a scribe for Dr. Antoine Primas.  This visit occurred during the SARS-CoV-2 public health emergency.  Safety protocols were in place, including screening questions prior to the visit, additional usage of staff PPE, and extensive cleaning of exam room while observing appropriate contact time as indicated for disinfecting solutions.   I'm seeing this patient by the request  of:  Patient, [name] Per (Inactive)  CC: back and neck pain   [id number]  Frank [name] is a 30 y.o. male coming in with complaint of back and neck pain. OMT 09/02/2020. Patient states that he was having relief from injections but that they are wearing off.   Patient also notes L foot, 2nd toe- hyperflexed toe and it turned purple.   Right hand 2nd-unable to make fist due to pain-thinks he hurt himself when using sledgehammer on tire.   Medications patient has been prescribed: Cymbalta, Zanaflex          Past Medical History:  Diagnosis Date   ADD (attention deficit disorder)    Anxiety    Narcolepsy through school    No Known Allergies   Review of Systems:  No headache, visual changes, nausea, vomiting, diarrhea, constipation, dizziness, abdominal pain, skin rash, fevers, chills, night sweats, weight loss, swollen lymph nodes, body aches, joint swelling, chest pain, shortness of breath, mood changes. POSITIVE muscle aches  Objective  Blood pressure 120/84, pulse 80, height 6' (1.829 m), weight 263 lb (119.3 kg), SpO2 98 %.   General: No apparent distress alert and oriented x3 mood and affect normal, dressed appropriately.  HEENT: Pupils equal, extraocular movements intact  Respiratory: Patient's speak in full sentences and does not appear short of breath  Cardiovascular: No lower extremity edema, non tender, no erythema  Gait normal with good balance  and coordination.  Back - Low back TTP in the right TL- right SI joint, positive FABER  Toe contusion- bruise noted of second toenail.  Right index finger-mild swelling noted and full range of motion noted.  No significant angulation of the finger.  Good capillary refill.  Good grip strength  Osteopathic findings  C2 flexed rotated and side bent right C7 flexed rotated and side bent left T3 extended rotated and side bent right inhaled rib T8 extended rotated and side bent left L2 flexed rotated and side bent right Sacrum right on right       Assessment and Plan:  Toe contusion Toe contusion overall.  Discussed continuing to monitor.  Patient did have some mild bruising underneath the nailbed but it does appear that the proximal aspect was spared.  Discussed icing regimen and home exercises.  Discussed proper shoes.  Follow-up again in 4 weeks    Nonallopathic problems  Decision today to treat with OMT was based on Physical Exam  After verbal consent patient was treated with HVLA, ME, FPR techniques in cervical, rib, thoracic, lumbar, and sacral  areas  Patient tolerated the procedure well with improvement in symptoms  Patient given exercises, stretches and lifestyle modifications  See medications in patient instructions if given  Patient will follow up in 4-8 weeks      The above documentation has been reviewed and is accurate and complete [name], DO       Note: This dictation was prepared with Dragon dictation along with smaller phrase technology. Any transcriptional errors that  result from this process are unintentional.

## 2020-09-27 ENCOUNTER — Ambulatory Visit (INDEPENDENT_AMBULATORY_CARE_PROVIDER_SITE_OTHER): Payer: 59 | Admitting: Family Medicine

## 2020-09-27 ENCOUNTER — Other Ambulatory Visit: Payer: Self-pay

## 2020-09-27 ENCOUNTER — Encounter: Payer: Self-pay | Admitting: Family Medicine

## 2020-09-27 VITALS — BP 120/84 | HR 80 | Ht 72.0 in | Wt 263.0 lb

## 2020-09-27 DIAGNOSIS — M9908 Segmental and somatic dysfunction of rib cage: Secondary | ICD-10-CM

## 2020-09-27 DIAGNOSIS — M9904 Segmental and somatic dysfunction of sacral region: Secondary | ICD-10-CM

## 2020-09-27 DIAGNOSIS — S90122A Contusion of left lesser toe(s) without damage to nail, initial encounter: Secondary | ICD-10-CM

## 2020-09-27 DIAGNOSIS — M5416 Radiculopathy, lumbar region: Secondary | ICD-10-CM

## 2020-09-27 DIAGNOSIS — M9901 Segmental and somatic dysfunction of cervical region: Secondary | ICD-10-CM

## 2020-09-27 DIAGNOSIS — M9902 Segmental and somatic dysfunction of thoracic region: Secondary | ICD-10-CM | POA: Diagnosis not present

## 2020-09-27 DIAGNOSIS — S6991XA Unspecified injury of right wrist, hand and finger(s), initial encounter: Secondary | ICD-10-CM

## 2020-09-27 DIAGNOSIS — S90129A Contusion of unspecified lesser toe(s) without damage to nail, initial encounter: Secondary | ICD-10-CM | POA: Insufficient documentation

## 2020-09-27 NOTE — Assessment & Plan Note (Signed)
Toe contusion overall.  Discussed continuing to monitor.  Patient did have some mild bruising underneath the nailbed but it does appear that the proximal aspect was spared.  Discussed icing regimen and home exercises.  Discussed proper shoes.  Follow-up again in 4 weeks

## 2020-09-27 NOTE — Assessment & Plan Note (Signed)
Sprain noted.  Discussed icing regimen and home exercises.  Discussed avoiding certain activities.  Follow-up with me again only if worsening pain for this.  We will do buddy taping at this moment.

## 2020-09-27 NOTE — Patient Instructions (Signed)
Arnica lotion Buddy tape [facility] [phone]

## 2020-10-08 ENCOUNTER — Ambulatory Visit
Admission: RE | Admit: 2020-10-08 | Discharge: 2020-10-08 | Disposition: A | Discharge status: Home / Self Care | Payer: 59 | Source: Ambulatory Visit | Attending: Family Medicine | Admitting: Family Medicine

## 2020-10-08 ENCOUNTER — Other Ambulatory Visit: Payer: Self-pay

## 2020-10-08 DIAGNOSIS — M5416 Radiculopathy, lumbar region: Secondary | ICD-10-CM

## 2020-10-08 MED ORDER — IOPAMIDOL (ISOVUE-M 200) INJECTION 41%
1.0000 mL | Freq: Once | INTRAMUSCULAR | Status: AC
  Administered 2020-10-08: 1 mL via EPIDURAL

## 2020-10-08 MED ORDER — METHYLPREDNISOLONE ACETATE 40 MG/ML INJ SUSP (RADIOLOG
80.0000 mg | Freq: Once | INTRAMUSCULAR | Status: AC
  Administered 2020-10-08: 80 mg via EPIDURAL

## 2020-10-08 NOTE — Discharge Instructions (Signed)

## 2020-10-13 NOTE — Progress Notes (Signed)
Frank [name] [address] [place] [address] Phone: [phone] Subjective:   [name], am serving as a scribe for Dr. Antoine Primas.  This visit occurred during the SARS-CoV-2 public health emergency.  Safety protocols were in place, including screening questions prior to the visit, additional usage of staff PPE, and extensive cleaning of exam room while observing appropriate contact time as indicated for disinfecting solutions.   I'm seeing this patient by the request  of:  Patient, [name] Per (Inactive)  CC: Back pain follow-up  [id number]  Frank [name] is a 30 y.o. male coming in with complaint of back and neck pain. OMT 09/27/2020. Nerve root injections 10/08/2020. Patient states overall doing relatively well.  Continues to have some mild discomfort.  Feels that the nerve root injection is not quite as good as his previous one.  Patient is also having new onset of left knee pain.  Seems to be anterior and lateral.  Tightness noted of the quadricep he notices after working out.  Has been running more stairs and could be contributing.  Medications patient has been prescribed: Cymbalta, Zanaflex         Past Medical History:  Diagnosis Date   ADD (attention deficit disorder)    Anxiety    Narcolepsy through school    No Known Allergies   Review of Systems:  No headache, visual changes, nausea, vomiting, diarrhea, constipation, dizziness, abdominal pain, skin rash, fevers, chills, night sweats, weight loss, swollen lymph nodes, body aches, joint swelling, chest pain, shortness of breath, mood changes. POSITIVE muscle aches  Objective  Blood pressure 130/86, pulse 78, height 6' (1.829 m), weight 259 lb (117.5 kg), SpO2 98 %.   General: No apparent distress alert and oriented x3 mood and affect normal, dressed appropriately.  HEENT: Pupils equal, extraocular movements intact  Respiratory: Patient's speak in full sentences and  does not appear short of breath  Cardiovascular: No lower extremity edema, non tender, no erythema  Left knee exam shows the patient does have very mild lateral tracking of the patella.  Negative McMurray's.  Full range of motion otherwise.  Good strength of the quadriceps and the hamstring.  Negative straight leg test at this moment. Low back and the left wrist with some loss of range of motion with flexion extension.  Mild loss of sidebending as well.  Patient does have tightness with FABER test.  Negative straight leg test today.  Patient does have some mild worsening pain with extension.  Osteopathic findings  C2 flexed rotated and side bent right C7 flexed rotated and side bent left T3 extended rotated and side bent right inhaled rib T8 extended rotated and side bent left L2 flexed rotated and side bent right Sacrum right on right       Assessment and Plan:  Left knee pain Acute left knee pain.  New problem.  Should provide prescription.  Patient does working out on a regular basis and encouraged him to do so.  Patient is doing better overall and has lost some weight.  Encouraged him to continue to do what he is doing at this moment.  Follow-up again in 4 to 6 weeks responding well to the manipulation.  No change in medications including the Cymbalta  Acute left lumbar radiculopathy Improvement overall but is having continued mild pain of the back.  Does have a Shermans disease likely contributing.  Patient has had third epidural but is doing better overall and has  been able to work out on a more regular basis.  Increase activity as tolerated.  We will continue to monitor.  Follow-up again in 4 to 6 weeks   Nonallopathic problems  Decision today to treat with OMT was based on Physical Exam  After verbal consent patient was treated with HVLA, ME, FPR techniques in cervical, rib, thoracic, lumbar, and sacral  areas  Patient tolerated the procedure well with improvement in  symptoms  Patient given exercises, stretches and lifestyle modifications  See medications in patient instructions if given  Patient will follow up in 4-8 weeks      The above documentation has been reviewed and is accurate and complete [name], DO        Note: This dictation was prepared with Dragon dictation along with smaller phrase technology. Any transcriptional errors that result from this process are unintentional.

## 2020-10-14 ENCOUNTER — Other Ambulatory Visit: Payer: Self-pay

## 2020-10-14 ENCOUNTER — Encounter: Payer: Self-pay | Admitting: Family Medicine

## 2020-10-14 ENCOUNTER — Ambulatory Visit (INDEPENDENT_AMBULATORY_CARE_PROVIDER_SITE_OTHER): Payer: 59 | Admitting: Family Medicine

## 2020-10-14 ENCOUNTER — Other Ambulatory Visit: Payer: Self-pay | Admitting: Family Medicine

## 2020-10-14 VITALS — BP 130/86 | HR 78 | Ht 72.0 in | Wt 259.0 lb

## 2020-10-14 DIAGNOSIS — M9902 Segmental and somatic dysfunction of thoracic region: Secondary | ICD-10-CM | POA: Diagnosis not present

## 2020-10-14 DIAGNOSIS — M9903 Segmental and somatic dysfunction of lumbar region: Secondary | ICD-10-CM | POA: Diagnosis not present

## 2020-10-14 DIAGNOSIS — M5416 Radiculopathy, lumbar region: Secondary | ICD-10-CM | POA: Diagnosis not present

## 2020-10-14 DIAGNOSIS — M9901 Segmental and somatic dysfunction of cervical region: Secondary | ICD-10-CM | POA: Diagnosis not present

## 2020-10-14 DIAGNOSIS — M9908 Segmental and somatic dysfunction of rib cage: Secondary | ICD-10-CM | POA: Diagnosis not present

## 2020-10-14 DIAGNOSIS — M9904 Segmental and somatic dysfunction of sacral region: Secondary | ICD-10-CM | POA: Diagnosis not present

## 2020-10-14 DIAGNOSIS — M25562 Pain in left knee: Secondary | ICD-10-CM

## 2020-10-14 NOTE — Patient Instructions (Signed)
Wear brace when exercising Dry needle T7 to T10 See you again in 4 to 6 weeks

## 2020-10-14 NOTE — Assessment & Plan Note (Signed)
Acute left knee pain.  New problem.  Should provide prescription.  Patient does working out on a regular basis and encouraged him to do so.  Patient is doing better overall and has lost some weight.  Encouraged him to continue to do what he is doing at this moment.  Follow-up again in 4 to 6 weeks responding well to the manipulation.  No change in medications including the Cymbalta

## 2020-10-14 NOTE — Assessment & Plan Note (Signed)
Improvement overall but is having continued mild pain of the back.  Does have a Shermans disease likely contributing.  Patient has had third epidural but is doing better overall and has been able to work out on a more regular basis.  Increase activity as tolerated.  We will continue to monitor.  Follow-up again in 4 to 6 weeks

## 2020-11-03 NOTE — Progress Notes (Signed)
[name] [address] [place] [address] Phone: [phone] Subjective:   [name], am serving as a scribe for Dr. Antoine Primas. This visit occurred during the SARS-CoV-2 public health emergency.  Safety protocols were in place, including screening questions prior to the visit, additional usage of staff PPE, and extensive cleaning of exam room while observing appropriate contact time as indicated for disinfecting solutions.   I'm seeing this patient by the request  of:  Patient, [name] Per (Inactive)  CC: back and neck pain   [id number]  Frank [name] is a 30 y.o. male coming in with complaint of back and neck pain. OMT 10/14/2020. Patient states pain is the same in back and neck. Knee pain is getting progressively better. No new complaints.  Medications patient has been prescribed: Cymbalta, Zanaflex          Past Medical History:  Diagnosis Date   ADD (attention deficit disorder)    Anxiety    Narcolepsy through school    No Known Allergies   Review of Systems:  No headache, visual changes, nausea, vomiting, diarrhea, constipation, dizziness, abdominal pain, skin rash, fevers, chills, night sweats, weight loss, swollen lymph nodes,joint swelling, chest pain, shortness of breath, mood changes. POSITIVE muscle aches, body aches, fatigue  Objective  Blood pressure (!) 130/94, pulse 73, height 6' (1.829 m), weight 255 lb (115.7 kg), SpO2 97 %.   General: No apparent distress alert and oriented x3 mood and affect normal, dressed appropriately.  HEENT: Pupils equal, extraocular movements intact  Respiratory: Patient's speak in full sentences and does not appear short of breath  Cardiovascular: No lower extremity edema, non tender, no erythema  Patient's low back does have some mild loss of lordosis.  Some increase in kyphosis noted.  Tightness noted in the parascapular region left greater than right.  Neck exam does have some  very mild tightness with sidebending bilaterally.  Left knee exam still has some mild lateral tracking of the patella noted.  Mild positive grind test noted.  Osteopathic findings  C6 flexed rotated and side bent left T4 extended rotated and side bent left inhaled rib L2 flexed rotated and side bent right Sacrum right on right       Assessment and Plan:  Low testosterone in male Patient has been followed by endocrinology but is having a little bit more fatigue at this time as well as pain.  We will recheck testosterone to see where patient is at the moment.  Not on any treatment at the moment.  Follow-up with me again 6 to 8 weeks  Acute left lumbar radiculopathy Patient has some better overall but does not give any history of 1 time a potential spasm that is somewhat concerning.  Discussed icing regimen and home exercises, discussed which activities to do which wants to avoid.  Patient is continuing to work out as well as do physical therapy.  Increase activity slowly.  No significant changes in management and continuing the Cymbalta.  Follow-up again in 4 to 5 weeks  Left knee pain Improving with triple leg.  Patient is doing relatively well.  Discussed icing regimen and home exercises.  Increase activity slowly.  Follow-up again in4-5 weeks   Nonallopathic problems  Decision today to treat with OMT was based on Physical Exam  After verbal consent patient was treated with HVLA, ME, FPR techniques in cervical, rib, thoracic, lumbar, and sacral  areas  Patient tolerated the procedure well  with improvement in symptoms  Patient given exercises, stretches and lifestyle modifications  See medications in patient instructions if given  Patient will follow up in 4-8 weeks      The above documentation has been reviewed and is accurate and complete [name], DO        Note: This dictation was prepared with Dragon dictation along with smaller phrase technology. Any  transcriptional errors that result from this process are unintentional.

## 2020-11-04 ENCOUNTER — Other Ambulatory Visit: Payer: Self-pay

## 2020-11-04 ENCOUNTER — Ambulatory Visit (INDEPENDENT_AMBULATORY_CARE_PROVIDER_SITE_OTHER): Payer: 59 | Admitting: Family Medicine

## 2020-11-04 VITALS — BP 130/94 | HR 73 | Ht 72.0 in | Wt 255.0 lb

## 2020-11-04 DIAGNOSIS — M9903 Segmental and somatic dysfunction of lumbar region: Secondary | ICD-10-CM | POA: Diagnosis not present

## 2020-11-04 DIAGNOSIS — M2550 Pain in unspecified joint: Secondary | ICD-10-CM

## 2020-11-04 DIAGNOSIS — M9908 Segmental and somatic dysfunction of rib cage: Secondary | ICD-10-CM | POA: Diagnosis not present

## 2020-11-04 DIAGNOSIS — M5416 Radiculopathy, lumbar region: Secondary | ICD-10-CM | POA: Diagnosis not present

## 2020-11-04 DIAGNOSIS — M9904 Segmental and somatic dysfunction of sacral region: Secondary | ICD-10-CM | POA: Diagnosis not present

## 2020-11-04 DIAGNOSIS — M9901 Segmental and somatic dysfunction of cervical region: Secondary | ICD-10-CM | POA: Diagnosis not present

## 2020-11-04 DIAGNOSIS — M25562 Pain in left knee: Secondary | ICD-10-CM

## 2020-11-04 DIAGNOSIS — M9902 Segmental and somatic dysfunction of thoracic region: Secondary | ICD-10-CM

## 2020-11-04 DIAGNOSIS — R7989 Other specified abnormal findings of blood chemistry: Secondary | ICD-10-CM

## 2020-11-04 DIAGNOSIS — M4200 Juvenile osteochondrosis of spine, site unspecified: Secondary | ICD-10-CM

## 2020-11-04 LAB — CBC WITH DIFFERENTIAL/PLATELET
Basophils Absolute: 0 10*3/uL (ref 0.0–0.1)
Basophils Relative: 0.5 % (ref 0.0–3.0)
Eosinophils Absolute: 0.2 10*3/uL (ref 0.0–0.7)
Eosinophils Relative: 3.4 % (ref 0.0–5.0)
HCT: 47.1 % (ref 39.0–52.0)
Hemoglobin: 16.5 g/dL (ref 13.0–17.0)
Lymphocytes Relative: 26.7 % (ref 12.0–46.0)
Lymphs Abs: 1.5 10*3/uL (ref 0.7–4.0)
MCHC: 34.9 g/dL (ref 30.0–36.0)
MCV: 91.1 fl (ref 78.0–100.0)
Monocytes Absolute: 0.5 10*3/uL (ref 0.1–1.0)
Monocytes Relative: 9.1 % (ref 3.0–12.0)
Neutro Abs: 3.5 10*3/uL (ref 1.4–7.7)
Neutrophils Relative %: 60.3 % (ref 43.0–77.0)
Platelets: 147 10*3/uL — ABNORMAL LOW (ref 150.0–400.0)
RBC: 5.17 Mil/uL (ref 4.22–5.81)
RDW: 12.6 % (ref 11.5–15.5)
WBC: 5.8 10*3/uL (ref 4.0–10.5)

## 2020-11-04 LAB — COMPREHENSIVE METABOLIC PANEL
ALT: 30 U/L (ref 0–53)
AST: 26 U/L (ref 0–37)
Albumin: 4.3 g/dL (ref 3.5–5.2)
Alkaline Phosphatase: 51 U/L (ref 39–117)
BUN: 15 mg/dL (ref 6–23)
CO2: 26 mEq/L (ref 19–32)
Calcium: 9.3 mg/dL (ref 8.4–10.5)
Chloride: 105 mEq/L (ref 96–112)
Creatinine, Ser: 1.1 mg/dL (ref 0.40–1.50)
GFR: 90.69 mL/min (ref >60.00)
Glucose, Bld: 89 mg/dL (ref 70–99)
Potassium: 3.8 mEq/L (ref 3.5–5.1)
Sodium: 141 mEq/L (ref 135–145)
Total Bilirubin: 1.1 mg/dL (ref 0.2–1.2)
Total Protein: 7.1 g/dL (ref 6.0–8.3)

## 2020-11-04 LAB — SEDIMENTATION RATE: Sed Rate: 7 mm/hr (ref 0–15)

## 2020-11-04 LAB — URIC ACID: Uric Acid, Serum: 7.2 mg/dL (ref 4.0–7.8)

## 2020-11-04 LAB — IBC PANEL
Iron: 130 ug/dL (ref 42–165)
Saturation Ratios: 36.7 % (ref 20.0–50.0)
TIBC: 354.2 ug/dL (ref 250.0–450.0)
Transferrin: 253 mg/dL (ref 212.0–360.0)

## 2020-11-04 LAB — TESTOSTERONE: Testosterone: 388.54 ng/dL (ref 300.00–890.00)

## 2020-11-04 LAB — TSH: TSH: 1.85 u[IU]/mL (ref 0.35–5.50)

## 2020-11-04 LAB — VITAMIN D 25 HYDROXY (VIT D DEFICIENCY, FRACTURES): VITD: 53.47 ng/mL (ref 30.00–100.00)

## 2020-11-04 LAB — FERRITIN: Ferritin: 147 ng/mL (ref 22.0–322.0)

## 2020-11-04 NOTE — Assessment & Plan Note (Signed)
Patient has been followed by endocrinology but is having a little bit more fatigue at this time as well as pain.  We will recheck testosterone to see where patient is at the moment.  Not on any treatment at the moment.  Follow-up with me again 6 to 8 weeks

## 2020-11-04 NOTE — Assessment & Plan Note (Signed)
Patient has some better overall but does not give any history of 1 time a potential spasm that is somewhat concerning.  Discussed icing regimen and home exercises, discussed which activities to do which wants to avoid.  Patient is continuing to work out as well as do physical therapy.  Increase activity slowly.  No significant changes in management and continuing the Cymbalta.  Follow-up again in 4 to 5 weeks

## 2020-11-04 NOTE — Assessment & Plan Note (Signed)
Improving with triple leg.  Patient is doing relatively well.  Discussed icing regimen and home exercises.  Increase activity slowly.  Follow-up again in4-5 weeks

## 2020-11-04 NOTE — Patient Instructions (Signed)
Labs today Put in metatarsal pads See you again in 6-8 weeks

## 2020-11-21 ENCOUNTER — Other Ambulatory Visit: Payer: Self-pay | Admitting: Family Medicine

## 2020-11-23 NOTE — Progress Notes (Signed)
  [name] [address] [place] [address] Phone: [phone] Subjective:   [name], am serving as a scribe for Dr. Antoine Primas. This visit occurred during the SARS-CoV-2 public health emergency.  Safety protocols were in place, including screening questions prior to the visit, additional usage of staff PPE, and extensive cleaning of exam room while observing appropriate contact time as indicated for disinfecting solutions.   I'm seeing this patient by the request  of:  Patient, [name] Per (Inactive)  CC: Back and neck pain follow-up  [id number]  Frank [name] is a 30 y.o. male coming in with complaint of back and neck pain. OMT 11/04/2020. Patient states pain remains the same. No new complaints.  Medications patient has been prescribed: Trazadone, Zanaflex, Cymbalta        Past Medical History:  Diagnosis Date   ADD (attention deficit disorder)    Anxiety    Narcolepsy through school    No Known Allergies   Review of Systems:  No headache, visual changes, nausea, vomiting, diarrhea, constipation, dizziness, abdominal pain, skin rash, fevers, chills, night sweats, weight loss, swollen lymph nodes,, joint swelling, chest pain, shortness of breath, mood changes. POSITIVE muscle aches, body aches  Objective  Blood pressure 130/88, pulse 75, height 6' (1.829 m), weight 263 lb (119.3 kg), SpO2 97 %.   General: No apparent distress alert and oriented x3 mood and affect normal, dressed appropriately.  HEENT: Pupils equal, extraocular movements intact  Respiratory: Patient's speak in full sentences and does not appear short of breath  Patient does have significant tightness noted in the thoracic and the lumbar spine.  Pain is out of proportion to the amount of palpation.  Patient does have some tenderness to palpation also over the sacroiliac joints bilaterally.  Tightness in the neck but negative Spurling's.  Osteopathic  findings C4 flexed rotated and side bent left T3 extended rotated and side bent left inhaled rib T7 extended rotated and side bent right L1 flexed rotated and side bent right Sacrum right on right       Assessment and Plan:  Scheuermann's disease Chronic problem with exacerbation.  Patient has more tightness in the thoracic spine than he has had previously.  Discussed icing regimen and home exercises.  Discussed the Cymbalta.  Patient is getting chronic pain medications and manipulation will continue to try to decrease when possible.  Patient is having some signs of potential withdrawal or dependence.  Patient understands this and will talk to his other providers as well.  Follow-up with me again in 4 to 8 weeks   Nonallopathic problems  Decision today to treat with OMT was based on Physical Exam  After verbal consent patient was treated with HVLA, ME, FPR techniques in cervical, rib, thoracic, lumbar, and sacral  areas  Patient tolerated the procedure well with improvement in symptoms  Patient given exercises, stretches and lifestyle modifications  See medications in patient instructions if given  Patient will follow up in 4-8 weeks      The above documentation has been reviewed and is accurate and complete [name], DO       Note: This dictation was prepared with Dragon dictation along with smaller phrase technology. Any transcriptional errors that result from this process are unintentional.

## 2020-11-24 ENCOUNTER — Ambulatory Visit (INDEPENDENT_AMBULATORY_CARE_PROVIDER_SITE_OTHER): Payer: 59 | Admitting: Family Medicine

## 2020-11-24 ENCOUNTER — Encounter: Payer: Self-pay | Admitting: Family Medicine

## 2020-11-24 ENCOUNTER — Other Ambulatory Visit: Payer: Self-pay

## 2020-11-24 VITALS — BP 130/88 | HR 75 | Ht 72.0 in | Wt 263.0 lb

## 2020-11-24 DIAGNOSIS — M9903 Segmental and somatic dysfunction of lumbar region: Secondary | ICD-10-CM | POA: Diagnosis not present

## 2020-11-24 DIAGNOSIS — M4200 Juvenile osteochondrosis of spine, site unspecified: Secondary | ICD-10-CM | POA: Diagnosis not present

## 2020-11-24 DIAGNOSIS — M9904 Segmental and somatic dysfunction of sacral region: Secondary | ICD-10-CM

## 2020-11-24 DIAGNOSIS — M9902 Segmental and somatic dysfunction of thoracic region: Secondary | ICD-10-CM

## 2020-11-24 DIAGNOSIS — M9908 Segmental and somatic dysfunction of rib cage: Secondary | ICD-10-CM

## 2020-11-24 DIAGNOSIS — M9901 Segmental and somatic dysfunction of cervical region: Secondary | ICD-10-CM | POA: Diagnosis not present

## 2020-11-24 NOTE — Assessment & Plan Note (Signed)
Chronic problem with exacerbation.  Patient has more tightness in the thoracic spine than he has had previously.  Discussed icing regimen and home exercises.  Discussed the Cymbalta.  Patient is getting chronic pain medications and manipulation will continue to try to decrease when possible.  Patient is having some signs of potential withdrawal or dependence.  Patient understands this and will talk to his other providers as well.  Follow-up with me again in 4 to 8 weeks

## 2020-11-24 NOTE — Patient Instructions (Signed)
Good to see you! Document when the sweating occurs, lets find the reason See you again in 4 weeks

## 2020-12-10 ENCOUNTER — Telehealth: Payer: Self-pay | Admitting: Family Medicine

## 2020-12-10 NOTE — Telephone Encounter (Signed)
Patient asked if an order for an epidural could be sent in for him? He said that this was going to be discussed at his visit with Dr Katrinka Blazing but had to be rescheduled due to Katrinka Blazing being out. (Patient is scheduled to see Dr Jean Rosenthal for OMT)  Please advise.

## 2020-12-13 NOTE — Telephone Encounter (Signed)
Epidural has been ordered

## 2020-12-13 NOTE — Addendum Note (Signed)
Addended by: [name] R on: 12/13/2020 10:04 AM   Modules accepted: Orders

## 2020-12-14 NOTE — Progress Notes (Signed)
Frank [name] D.[facility] [address] [place] [address] Phone: [phone]   Assessment and Plan:     1. Thoracic spine pain 2. Somatic dysfunction of cervical region 3. Somatic dysfunction of thoracic region 4. Somatic dysfunction of lumbar region 5. Somatic dysfunction of pelvic region 6. Somatic dysfunction of rib region -Chronic with exacerbation, subsequent visit - Chronic multiple musculoskeletal complaints that typically are not treated well with relief with OMT.  Patient requesting additional OMT today.  Tolerated well per note below - Patient plan for lumbar epidural injection - Continue activity as tolerated    Decision today to treat with OMT was based on Physical Exam   After verbal consent patient was treated with HVLA (high velocity low amplitude), ME (muscle energy), FPR (flex positional release), ST (soft tissue), PC/PD (Pelvic Compression/ Pelvic Decompression) techniques in cervical, rib, thoracic, lumbar, and pelvic areas. Patient tolerated the procedure well with improvement in symptoms.  Patient educated on potential side effects of soreness and recommended to rest, hydrate, and use Tylenol as needed for pain control.   Pertinent previous records reviewed include previous office note, telephone encounter   Follow Up: 4 weeks for repeat OMT   Subjective:   I, [name], am serving as a scribe for Dr. Richardean Sale Chief Complaint: Back and neck pain   HPI:   12/15/20 Patient is a 30 year old male presenting with neck and back pain. Patient was last seen by Dr. Katrinka Blazing on 11/24/20 and had OMT. Today patient states still doing PT. Left knee still hurts, but getting better. Back nerve pain is coming back, but epidural is ordered.   Relevant Historical Information: None pertinent  Additional pertinent review of systems negative.  Current Outpatient Medications  Medication Sig Dispense Refill   albuterol (PROVENTIL  HFA;VENTOLIN HFA) 108 (90 Base) MCG/ACT inhaler Inhale 2 puffs into the lungs every 4 (four) hours as needed for wheezing or shortness of breath (cough, shortness of breath or wheezing.). 1 Inhaler 1   benzonatate (TESSALON) 200 MG capsule Take 1 capsule (200 mg total) by mouth 3 (three) times daily as needed for cough. 45 capsule 1   diclofenac (VOLTAREN) 75 MG EC tablet Take 1 tablet (75 mg total) by mouth 2 (two) times daily. 30 tablet 0   DULoxetine (CYMBALTA) 20 MG capsule TAKE 2 CAPSULES BY MOUTH EVERY DAY 60 capsule 0   DULoxetine (CYMBALTA) 60 MG capsule Take 60 mg by mouth daily.     DULoxetine (CYMBALTA) 60 MG capsule TAKE 1 CAPSULE BY MOUTH EVERY DAY 90 capsule 1   nortriptyline (PAMELOR) 10 MG capsule TAKE 1 CAPSULE (10 MG TOTAL) BY MOUTH AT BEDTIME. 90 capsule 0   predniSONE (DELTASONE) 20 MG tablet Take 1 tablet (20 mg total) by mouth daily with breakfast. 5 tablet 0   tiZANidine (ZANAFLEX) 4 MG tablet TAKE 1 TABLET BY MOUTH EVERY DAY IN THE EVENING 30 tablet 1   traZODone (DESYREL) 50 MG tablet TAKE 0.5-1 TABLETS BY MOUTH AT BEDTIME AS NEEDED FOR SLEEP. 90 tablet 2   Vitamin D, Ergocalciferol, (DRISDOL) 50000 units CAPS capsule Take 1 capsule (50,000 Units total) by mouth every 7 (seven) days. 12 capsule 3   zolpidem (AMBIEN) 10 MG tablet Take 1 tablet (10 mg total) by mouth at bedtime as needed. 30 tablet 0   No current facility-administered medications for this visit.      Objective:     Vitals:   12/15/20 0805  BP:  126/84  Pulse: 75  SpO2: 97%  Weight: 263 lb (119.3 kg)  Height: 6' (1.829 m)      Body mass index is 35.67 kg/m.    Physical Exam:     General: Well-appearing, cooperative, sitting comfortably in no acute distress.   OMT Physical Exam:  ASIS Compression Test: Positive Right Cervical: TTP paraspinal, occipital tension Rib: Bilateral elevated first rib with TTP worse on right compared to left Thoracic: TTP paraspinal, T3-5 RLSR Lumbar: TTP  paraspinal, L1-3 RRSL Pelvis: Right anterior innominate  Electronically signed by:  Frank [name] D.[facility] 8:29 AM 12/15/20

## 2020-12-15 ENCOUNTER — Ambulatory Visit (INDEPENDENT_AMBULATORY_CARE_PROVIDER_SITE_OTHER): Payer: 59 | Admitting: Sports Medicine

## 2020-12-15 ENCOUNTER — Ambulatory Visit: Payer: 59 | Admitting: Family Medicine

## 2020-12-15 ENCOUNTER — Other Ambulatory Visit: Payer: Self-pay

## 2020-12-15 VITALS — BP 126/84 | HR 75 | Ht 72.0 in | Wt 263.0 lb

## 2020-12-15 DIAGNOSIS — M9903 Segmental and somatic dysfunction of lumbar region: Secondary | ICD-10-CM

## 2020-12-15 DIAGNOSIS — M9901 Segmental and somatic dysfunction of cervical region: Secondary | ICD-10-CM

## 2020-12-15 DIAGNOSIS — M9902 Segmental and somatic dysfunction of thoracic region: Secondary | ICD-10-CM | POA: Diagnosis not present

## 2020-12-15 DIAGNOSIS — M9908 Segmental and somatic dysfunction of rib cage: Secondary | ICD-10-CM

## 2020-12-15 DIAGNOSIS — M546 Pain in thoracic spine: Secondary | ICD-10-CM | POA: Diagnosis not present

## 2020-12-15 DIAGNOSIS — M9905 Segmental and somatic dysfunction of pelvic region: Secondary | ICD-10-CM

## 2020-12-15 NOTE — Patient Instructions (Signed)
See you again in at next appointment

## 2020-12-31 NOTE — Progress Notes (Signed)
[name] [address] [place] [address] Phone: [phone]   Assessment and Plan:     1. Thoracic spine pain 2. Somatic dysfunction of cervical region 3. Somatic dysfunction of thoracic region 4. Somatic dysfunction of lumbar region 5. Somatic dysfunction of pelvic region 6. Somatic dysfunction of rib region -Chronic with exacerbation, subsequent visit - Recurrence of typical musculoskeletal complaints with most prominent being thoracic spine.  No red flag symptoms - Patient has received significant relief with OMT in the past.  Elects for repeat OMT today.  Tolerated well per note below. - Decision today to treat with OMT was based on Physical Exam   After verbal consent patient was treated with HVLA (high velocity low amplitude), ME (muscle energy), FPR (flex positional release), ST (soft tissue), PC/PD (Pelvic Compression/ Pelvic Decompression) techniques in cervical, rib, thoracic, lumbar, and pelvic areas. Patient tolerated the procedure well with improvement in symptoms.  Patient educated on potential side effects of soreness and recommended to rest, hydrate, and use Tylenol as needed for pain control.   Pertinent previous records reviewed include none   Follow Up: 3 to 4 weeks for repeat OMT   Subjective:   I, Debbe Odea, am serving as a scribe for Dr. Richardean Sale  Chief Complaint: Neck and back pain   HPI:     12/15/20 Patient is a 30 year old male presenting with neck and back pain. Patient was last seen by Dr. Katrinka Blazing on 11/24/20 and had OMT. Today patient states still doing PT. Left knee still hurts, but getting better. Back nerve pain is coming back, but epidural is ordered.   01/03/21 Patient states just needing maintenance OMT, but is very tight.   Relevant Historical Information: Elevated BMI  Additional pertinent review of systems negative.  Current Outpatient Medications  Medication Sig Dispense Refill    albuterol (PROVENTIL HFA;VENTOLIN HFA) 108 (90 Base) MCG/ACT inhaler Inhale 2 puffs into the lungs every 4 (four) hours as needed for wheezing or shortness of breath (cough, shortness of breath or wheezing.). 1 Inhaler 1   benzonatate (TESSALON) 200 MG capsule Take 1 capsule (200 mg total) by mouth 3 (three) times daily as needed for cough. 45 capsule 1   diclofenac (VOLTAREN) 75 MG EC tablet Take 1 tablet (75 mg total) by mouth 2 (two) times daily. 30 tablet 0   DULoxetine (CYMBALTA) 20 MG capsule TAKE 2 CAPSULES BY MOUTH EVERY DAY 60 capsule 0   DULoxetine (CYMBALTA) 60 MG capsule Take 60 mg by mouth daily.     DULoxetine (CYMBALTA) 60 MG capsule TAKE 1 CAPSULE BY MOUTH EVERY DAY 90 capsule 1   nortriptyline (PAMELOR) 10 MG capsule TAKE 1 CAPSULE (10 MG TOTAL) BY MOUTH AT BEDTIME. 90 capsule 0   predniSONE (DELTASONE) 20 MG tablet Take 1 tablet (20 mg total) by mouth daily with breakfast. 5 tablet 0   tiZANidine (ZANAFLEX) 4 MG tablet TAKE 1 TABLET BY MOUTH EVERY DAY IN THE EVENING 30 tablet 1   traZODone (DESYREL) 50 MG tablet TAKE 0.5-1 TABLETS BY MOUTH AT BEDTIME AS NEEDED FOR SLEEP. 90 tablet 2   Vitamin D, Ergocalciferol, (DRISDOL) 50000 units CAPS capsule Take 1 capsule (50,000 Units total) by mouth every 7 (seven) days. 12 capsule 3   zolpidem (AMBIEN) 10 MG tablet Take 1 tablet (10 mg total) by mouth at bedtime as needed. 30 tablet 0   No current facility-administered medications for this visit.  Objective:     There were no vitals filed for this visit.    There is no height or weight on file to calculate BMI.    Physical Exam:     General: Well-appearing, cooperative, sitting comfortably in no acute distress.   OMT Physical Exam:  ASIS Compression Test: Positive Right Cervical: nTTP paraspinal, no significant dysfunction Rib: Left elevated first rib with TTP Thoracic: TTP paraspinal, T6-10 RRSL Lumbar: nTTP paraspinal, L2 RRSR Pelvis: Left posterior  innominate  Electronically signed by:  [name] 12:49 PM 12/31/20

## 2021-01-03 ENCOUNTER — Other Ambulatory Visit: Payer: Self-pay

## 2021-01-03 ENCOUNTER — Ambulatory Visit (INDEPENDENT_AMBULATORY_CARE_PROVIDER_SITE_OTHER): Payer: 59 | Admitting: Sports Medicine

## 2021-01-03 VITALS — BP 140/100 | HR 76 | Ht 72.0 in | Wt 262.0 lb

## 2021-01-03 DIAGNOSIS — M9902 Segmental and somatic dysfunction of thoracic region: Secondary | ICD-10-CM

## 2021-01-03 DIAGNOSIS — M9903 Segmental and somatic dysfunction of lumbar region: Secondary | ICD-10-CM

## 2021-01-03 DIAGNOSIS — M9908 Segmental and somatic dysfunction of rib cage: Secondary | ICD-10-CM

## 2021-01-03 DIAGNOSIS — M9905 Segmental and somatic dysfunction of pelvic region: Secondary | ICD-10-CM

## 2021-01-03 DIAGNOSIS — M546 Pain in thoracic spine: Secondary | ICD-10-CM | POA: Diagnosis not present

## 2021-01-03 DIAGNOSIS — M9901 Segmental and somatic dysfunction of cervical region: Secondary | ICD-10-CM | POA: Diagnosis not present

## 2021-01-03 NOTE — Patient Instructions (Signed)
Good to see you   Keep follow up with Dr. Katrinka Blazing

## 2021-01-04 ENCOUNTER — Ambulatory Visit: Payer: 59 | Admitting: Family Medicine

## 2021-01-10 ENCOUNTER — Other Ambulatory Visit: Payer: Self-pay

## 2021-01-10 ENCOUNTER — Ambulatory Visit
Admission: RE | Admit: 2021-01-10 | Discharge: 2021-01-10 | Disposition: A | Discharge status: Home / Self Care | Payer: 59 | Source: Ambulatory Visit | Attending: Family Medicine | Admitting: Family Medicine

## 2021-01-10 DIAGNOSIS — M9903 Segmental and somatic dysfunction of lumbar region: Secondary | ICD-10-CM

## 2021-01-10 MED ORDER — METHYLPREDNISOLONE ACETATE 40 MG/ML INJ SUSP (RADIOLOG
80.0000 mg | Freq: Once | INTRAMUSCULAR | Status: AC
  Administered 2021-01-10: 80 mg via EPIDURAL

## 2021-01-10 MED ORDER — IOPAMIDOL (ISOVUE-M 200) INJECTION 41%
1.0000 mL | Freq: Once | INTRAMUSCULAR | Status: AC
  Administered 2021-01-10: 1 mL via EPIDURAL

## 2021-01-10 NOTE — Discharge Instructions (Signed)

## 2021-01-25 NOTE — Progress Notes (Signed)
[name] [address] [place] [address] Phone: [phone] Subjective:   Frank [name], am serving as a scribe for Dr. Antoine Primas. This visit occurred during the SARS-CoV-2 public health emergency.  Safety protocols were in place, including screening questions prior to the visit, additional usage of staff PPE, and extensive cleaning of exam room while observing appropriate contact time as indicated for disinfecting solutions.   I'm seeing this patient by the request  of:  Patient, [name] Per (Inactive)  CC: neck and back pain   [id number]  Frank [name] is a 30 y.o. male coming in with complaint of back and neck pain. OMT 11/24/2020. Patient states same areas of irritation. Wanted to go over medications. No other complaints.  Has also noticed some leg cramping at night is worse than usual.  Patient does state that he does snore  Medications patient has been prescribed: Trazadone, Cymbalta, Zanaflex  Taking:yes          Reviewed prior external information including notes and imaging from previsou exam, outside providers and external EMR if available.   As well as notes that were available from care everywhere and other healthcare systems.  Past medical history, social, surgical and family history all reviewed in electronic medical record.  No pertanent information unless stated regarding to the chief complaint.   Past Medical History:  Diagnosis Date   ADD (attention deficit disorder)    Anxiety    Narcolepsy through school    No Known Allergies   Review of Systems:  No headache, visual changes, nausea, vomiting, diarrhea, constipation, dizziness, abdominal pain, skin rash, fevers, chills, night sweats, weight loss, swollen lymph nodes,  joint swelling, chest pain, shortness of breath, mood changes. POSITIVE muscle aches, body aches  Objective  Blood pressure 132/90, pulse 87, height 6' (1.829 m), weight 269 lb (122 kg),  SpO2 97 %.   General: No apparent distress alert and oriented x3 mood and affect normal, dressed appropriately.  HEENT: Pupils equal, extraocular movements intact  Respiratory: Patient's speak in full sentences and does not appear short of breath  Cardiovascular: No lower extremity edema, non tender, no erythema  Neck exam neck Does have some loss of lordosis. Back exam back exam still has a positive straight leg test on the left side.  Osteopathic findings  C2 flexed rotated and side bent right C6 flexed rotated and side bent left T3 extended rotated and side bent right inhaled rib T5 extended rotated and side bent left L2 flexed rotated and side bent right Sacrum left on left       Assessment and Plan:  Acute left lumbar radiculopathy Patient continues to have more tightness on the left side of the lumbar spine.  Discussed icing regimen and home exercises.  Increase activity slowly.  Patient is to continue to try the Cymbalta at the moment.  Is at 60 mg.  Discussed the possibility of increasing the patient is also on Wellbutrin.  Also on May need to consider the possibility of changing medications but we will start with a very low dose of Synthroid to use as more of an adjunct therapy.  Patient will follow up with me again in 6 to 8 weeks otherwise.   Nonallopathic problems  Decision today to treat with OMT was based on Physical Exam  After verbal consent patient was treated with HVLA, ME, FPR techniques in cervical, rib, thoracic, lumbar, and sacral  areas  Patient tolerated the procedure  well with improvement in symptoms  Patient given exercises, stretches and lifestyle modifications  See medications in patient instructions if given  Patient will follow up in 4-8 weeks      The above documentation has been reviewed and is accurate and complete [name], DO        Note: This dictation was prepared with Dragon dictation along with smaller phrase technology.  Any transcriptional errors that result from this process are unintentional.

## 2021-01-26 ENCOUNTER — Other Ambulatory Visit: Payer: Self-pay

## 2021-01-26 ENCOUNTER — Ambulatory Visit (INDEPENDENT_AMBULATORY_CARE_PROVIDER_SITE_OTHER): Payer: 59 | Admitting: Family Medicine

## 2021-01-26 VITALS — BP 132/90 | HR 87 | Ht 72.0 in | Wt 269.0 lb

## 2021-01-26 DIAGNOSIS — M9901 Segmental and somatic dysfunction of cervical region: Secondary | ICD-10-CM

## 2021-01-26 DIAGNOSIS — M9903 Segmental and somatic dysfunction of lumbar region: Secondary | ICD-10-CM | POA: Diagnosis not present

## 2021-01-26 DIAGNOSIS — M5416 Radiculopathy, lumbar region: Secondary | ICD-10-CM

## 2021-01-26 DIAGNOSIS — M9902 Segmental and somatic dysfunction of thoracic region: Secondary | ICD-10-CM | POA: Diagnosis not present

## 2021-01-26 DIAGNOSIS — M9904 Segmental and somatic dysfunction of sacral region: Secondary | ICD-10-CM

## 2021-01-26 DIAGNOSIS — M9908 Segmental and somatic dysfunction of rib cage: Secondary | ICD-10-CM

## 2021-01-26 MED ORDER — LEVOTHYROXINE SODIUM 25 MCG PO TABS
25.0000 ug | ORAL_TABLET | Freq: Every day | ORAL | 1 refills | Status: DC
Start: 2021-01-26 — End: 2021-02-17

## 2021-01-26 NOTE — Patient Instructions (Addendum)
Synthroid 25 micrograms add by taking in morning See you again in 6 weeks Schedule injections 6 weeks from last (after January 9th) Continue to stay active

## 2021-01-26 NOTE — Assessment & Plan Note (Signed)
Patient continues to have more tightness on the left side of the lumbar spine.  Discussed icing regimen and home exercises.  Increase activity slowly.  Patient is to continue to try the Cymbalta at the moment.  Is at 60 mg.  Discussed the possibility of increasing the patient is also on Wellbutrin.  Also on May need to consider the possibility of changing medications but we will start with a very low dose of Synthroid to use as more of an adjunct therapy.  Patient will follow up with me again in 6 to 8 weeks otherwise.

## 2021-01-30 ENCOUNTER — Encounter: Payer: Self-pay | Admitting: Family Medicine

## 2021-02-02 ENCOUNTER — Ambulatory Visit (INDEPENDENT_AMBULATORY_CARE_PROVIDER_SITE_OTHER): Payer: 59 | Admitting: Sports Medicine

## 2021-02-02 ENCOUNTER — Other Ambulatory Visit: Payer: Self-pay

## 2021-02-02 VITALS — BP 140/80 | HR 84 | Ht 72.0 in | Wt 270.0 lb

## 2021-02-02 DIAGNOSIS — M9904 Segmental and somatic dysfunction of sacral region: Secondary | ICD-10-CM

## 2021-02-02 DIAGNOSIS — M9901 Segmental and somatic dysfunction of cervical region: Secondary | ICD-10-CM

## 2021-02-02 DIAGNOSIS — M546 Pain in thoracic spine: Secondary | ICD-10-CM

## 2021-02-02 DIAGNOSIS — M9903 Segmental and somatic dysfunction of lumbar region: Secondary | ICD-10-CM | POA: Diagnosis not present

## 2021-02-02 DIAGNOSIS — M9902 Segmental and somatic dysfunction of thoracic region: Secondary | ICD-10-CM | POA: Diagnosis not present

## 2021-02-02 DIAGNOSIS — M9905 Segmental and somatic dysfunction of pelvic region: Secondary | ICD-10-CM

## 2021-02-02 NOTE — Progress Notes (Addendum)
[name] [address] [place] [address] Phone: [phone]   Assessment and Plan:     1. Thoracic spine pain 2. Somatic dysfunction of cervical region 3. Somatic dysfunction of thoracic region 4. Somatic dysfunction of lumbar region 5. Somatic dysfunction of sacral region 6. Somatic dysfunction of pelvic region -Chronic with exacerbation, subsequent sports medicine visit - Recurrence of multiple musculoskeletal complaints with patient recently have a fall downstairs worsening pain over left hip, left lower back with radicular symptoms in the left leg that are all gradually improving - Patient has been using half tablet 4 mg tizanidine for muscle spasms.  Instructed that he may increase to full tablet 4 mg tizanidine over the next 3 to 5 days to reduce muscle spasms - Patient has received significant relief with OMT in the past.  Elects for repeat OMT today.  Tolerated well per note below. - Decision today to treat with OMT was based on Physical Exam   After verbal consent patient was treated with HVLA (high velocity low amplitude), ME (muscle energy), FPR (flex positional release), ST (soft tissue), PC/PD (Pelvic Compression/ Pelvic Decompression) techniques in cervical, rib, thoracic, lumbar, and pelvic areas. Patient tolerated the procedure well with improvement in symptoms.  Patient educated on potential side effects of soreness and recommended to rest, hydrate, and use Tylenol as needed for pain control.   Pertinent previous records reviewed include none   Follow Up: Patient has scheduled follow-up with Dr. Katrinka Blazing for repeat OMT and medication discussion   Subjective:    I, Frank [name], am serving as a Neurosurgeon for Doctor Richardean Sale  Chief Complaint: back pain   HPI:  01/26/2021 Frank [name] is a 30 y.o. male coming in with complaint of back and neck pain. OMT 11/24/2020. Patient states same areas of irritation. Wanted  to go over medications. No other complaints.  Has also noticed some leg cramping at night is worse than usual.  Patient does state that he does snore  02/02/21 Patient states he is doing pretty bad,had a fall a few days ago low back hit 5 stairs when he fell numbness and tingling , sitting and lying down is terrible radiating down toe curling pain, standing is okay but painful , walking up and down stairs hurt everything saw PT yesterday with dry needling and stem. Right side was able to tolerate treatment , left side was locked up and got no relief  Relevant Historical Information: Elevated BMI,  Additional pertinent review of systems negative.  Current Outpatient Medications  Medication Sig Dispense Refill   albuterol (PROVENTIL HFA;VENTOLIN HFA) 108 (90 Base) MCG/ACT inhaler Inhale 2 puffs into the lungs every 4 (four) hours as needed for wheezing or shortness of breath (cough, shortness of breath or wheezing.). 1 Inhaler 1   benzonatate (TESSALON) 200 MG capsule Take 1 capsule (200 mg total) by mouth 3 (three) times daily as needed for cough. 45 capsule 1   diclofenac (VOLTAREN) 75 MG EC tablet Take 1 tablet (75 mg total) by mouth 2 (two) times daily. 30 tablet 0   DULoxetine (CYMBALTA) 20 MG capsule TAKE 2 CAPSULES BY MOUTH EVERY DAY 60 capsule 0   DULoxetine (CYMBALTA) 60 MG capsule Take 60 mg by mouth daily.     DULoxetine (CYMBALTA) 60 MG capsule TAKE 1 CAPSULE BY MOUTH EVERY DAY 90 capsule 1   levothyroxine (SYNTHROID) 25 MCG tablet Take 1 tablet (25 mcg total) by mouth daily. 30 tablet  1   nortriptyline (PAMELOR) 10 MG capsule TAKE 1 CAPSULE (10 MG TOTAL) BY MOUTH AT BEDTIME. 90 capsule 0   predniSONE (DELTASONE) 20 MG tablet Take 1 tablet (20 mg total) by mouth daily with breakfast. 5 tablet 0   tiZANidine (ZANAFLEX) 4 MG tablet TAKE 1 TABLET BY MOUTH EVERY DAY IN THE EVENING 30 tablet 1   traZODone (DESYREL) 50 MG tablet TAKE 0.5-1 TABLETS BY MOUTH AT BEDTIME AS NEEDED FOR SLEEP. 90  tablet 2   Vitamin D, Ergocalciferol, (DRISDOL) 50000 units CAPS capsule Take 1 capsule (50,000 Units total) by mouth every 7 (seven) days. 12 capsule 3   zolpidem (AMBIEN) 10 MG tablet Take 1 tablet (10 mg total) by mouth at bedtime as needed. 30 tablet 0   No current facility-administered medications for this visit.      Objective:     Vitals:   02/02/21 1050  BP: 140/80  Pulse: 84  SpO2: 98%  Weight: 270 lb (122.5 kg)  Height: 6' (1.829 m)      Body mass index is 36.62 kg/m.    Physical Exam:     General: Well-appearing, cooperative, sitting comfortably in no acute distress.   OMT Physical Exam:  ASIS Compression Test: Positive left Cervical: TTP paraspinal, C5-7 RRSR Sacrum: Positive sphinx, TTP bilateral sacral base, worse on left Thoracic: TTP paraspinal, T5-9 RRSL Lumbar: TTP paraspinal, L1-3 RLSR Pelvis: Left anterior innominate  Electronically signed by:  [name] 11:22 AM 02/02/21

## 2021-02-02 NOTE — Patient Instructions (Addendum)
Good to see you   

## 2021-02-02 NOTE — Progress Notes (Signed)
[name] [address] [place] [address] Phone: [phone]   Assessment and Plan:     1. Thoracic spine pain 2. Somatic dysfunction of cervical region 3. Somatic dysfunction of thoracic region 4. Somatic dysfunction of lumbar region 5. Somatic dysfunction of sacral region 6. Somatic dysfunction of pelvic region -Chronic with exacerbation, subsequent sports medicine visit - Recurrence of multiple musculoskeletal complaints with patient recently have a fall downstairs worsening pain over left hip, left lower back with radicular symptoms in the left leg that are all gradually improving - Patient has been using half tablet 4 mg tizanidine for muscle spasms.  Instructed that he may increase to full tablet 4 mg tizanidine over the next 3 to 5 days to reduce muscle spasms - Patient has received significant relief with OMT in the past.  Elects for repeat OMT today.  Tolerated well per note below. - Decision today to treat with OMT was based on Physical Exam   After verbal consent patient was treated with HVLA (high velocity low amplitude), ME (muscle energy), FPR (flex positional release), ST (soft tissue), PC/PD (Pelvic Compression/ Pelvic Decompression) techniques in cervical, rib, thoracic, lumbar, and pelvic areas. Patient tolerated the procedure well with improvement in symptoms.  Patient educated on potential side effects of soreness and recommended to rest, hydrate, and use Tylenol as needed for pain control.   Pertinent previous records reviewed include none   Follow Up: Patient has scheduled follow-up with Dr. Katrinka Blazing for repeat OMT and medication discussion   Subjective:    I, Frank [name], am serving as a Neurosurgeon for Doctor Richardean Sale  Chief Complaint: back pain   HPI:  01/26/2021 Frank [name] is a 30 y.o. male coming in with complaint of back and neck pain. OMT 11/24/2020. Patient states same areas of irritation. Wanted  to go over medications. No other complaints.  Has also noticed some leg cramping at night is worse than usual.  Patient does state that he does snore  02/02/21 Patient states he is doing pretty bad,had a fall a few days ago low back hit 5 stairs when he fell numbness and tingling , sitting and lying down is terrible radiating down toe curling pain, standing is okay but painful , walking up and down stairs hurt everything saw PT yesterday with dry needling and stem. Right side was able to tolerate treatment , left side was locked up and got no relief  Relevant Historical Information: Elevated BMI,  Additional pertinent review of systems negative.  Current Outpatient Medications  Medication Sig Dispense Refill   albuterol (PROVENTIL HFA;VENTOLIN HFA) 108 (90 Base) MCG/ACT inhaler Inhale 2 puffs into the lungs every 4 (four) hours as needed for wheezing or shortness of breath (cough, shortness of breath or wheezing.). 1 Inhaler 1   benzonatate (TESSALON) 200 MG capsule Take 1 capsule (200 mg total) by mouth 3 (three) times daily as needed for cough. 45 capsule 1   diclofenac (VOLTAREN) 75 MG EC tablet Take 1 tablet (75 mg total) by mouth 2 (two) times daily. 30 tablet 0   DULoxetine (CYMBALTA) 20 MG capsule TAKE 2 CAPSULES BY MOUTH EVERY DAY 60 capsule 0   DULoxetine (CYMBALTA) 60 MG capsule Take 60 mg by mouth daily.     DULoxetine (CYMBALTA) 60 MG capsule TAKE 1 CAPSULE BY MOUTH EVERY DAY 90 capsule 1   levothyroxine (SYNTHROID) 25 MCG tablet Take 1 tablet (25 mcg total) by mouth daily. 30 tablet  1   nortriptyline (PAMELOR) 10 MG capsule TAKE 1 CAPSULE (10 MG TOTAL) BY MOUTH AT BEDTIME. 90 capsule 0   predniSONE (DELTASONE) 20 MG tablet Take 1 tablet (20 mg total) by mouth daily with breakfast. 5 tablet 0   tiZANidine (ZANAFLEX) 4 MG tablet TAKE 1 TABLET BY MOUTH EVERY DAY IN THE EVENING 30 tablet 1   traZODone (DESYREL) 50 MG tablet TAKE 0.5-1 TABLETS BY MOUTH AT BEDTIME AS NEEDED FOR SLEEP. 90  tablet 2   Vitamin D, Ergocalciferol, (DRISDOL) 50000 units CAPS capsule Take 1 capsule (50,000 Units total) by mouth every 7 (seven) days. 12 capsule 3   zolpidem (AMBIEN) 10 MG tablet Take 1 tablet (10 mg total) by mouth at bedtime as needed. 30 tablet 0   No current facility-administered medications for this visit.      Objective:     Vitals:   02/02/21 1050  BP: 140/80  Pulse: 84  SpO2: 98%  Weight: 270 lb (122.5 kg)  Height: 6' (1.829 m)      Body mass index is 36.62 kg/m.    Physical Exam:     General: Well-appearing, cooperative, sitting comfortably in no acute distress.   OMT Physical Exam:  ASIS Compression Test: Positive left Cervical: TTP paraspinal, C5-7 RRSR Sacrum: Positive sphinx, TTP bilateral sacral base, worse on left Thoracic: TTP paraspinal, T5-9 RRSL Lumbar: TTP paraspinal, L1-3 RLSR Pelvis: Left anterior innominate  Electronically signed by:  [name] 11:23 AM 02/02/21

## 2021-02-11 ENCOUNTER — Ambulatory Visit
Admission: RE | Admit: 2021-02-11 | Discharge: 2021-02-11 | Disposition: A | Discharge status: Home / Self Care | Payer: 59 | Source: Ambulatory Visit | Attending: Family Medicine | Admitting: Family Medicine

## 2021-02-11 DIAGNOSIS — M5416 Radiculopathy, lumbar region: Secondary | ICD-10-CM

## 2021-02-11 MED ORDER — METHYLPREDNISOLONE ACETATE 40 MG/ML INJ SUSP (RADIOLOG
80.0000 mg | Freq: Once | INTRAMUSCULAR | Status: AC
  Administered 2021-02-11: 80 mg via EPIDURAL

## 2021-02-11 MED ORDER — IOPAMIDOL (ISOVUE-M 200) INJECTION 41%
1.0000 mL | Freq: Once | INTRAMUSCULAR | Status: AC
  Administered 2021-02-11: 1 mL via EPIDURAL

## 2021-02-11 NOTE — Discharge Instructions (Signed)

## 2021-02-15 NOTE — Progress Notes (Signed)
[name] [address] [place] [address] Phone: [phone] Subjective:   Frank [name], am serving as a scribe for Dr. Antoine Primas.This visit occurred during the SARS-CoV-2 public health emergency.  Safety protocols were in place, including screening questions prior to the visit, additional usage of staff PPE, and extensive cleaning of exam room while observing appropriate contact time as indicated for disinfecting solutions.   I'm seeing this patient by the request  of:  Patient, [name] Per (Inactive)  CC: Neck and back pain follow-up  [id number]  Frank [name] is a 31 y.o. male coming in with complaint of back and neck pain. OMT 02/02/2021. Patient states that he has some L leg numbness that caused a fall since last visit. Saw Dr. Jean Rosenthal. Sitting and lying down increases the pain in posterior aspect of L leg. Does have radiating symptoms into R leg as well. Had epidural injection on Friday and this did help some symptoms diminish.   Medications patient has been prescribed: Synthroid   Taking:         Reviewed prior external information including notes and imaging from previsou exam, outside providers and external EMR if available.   As well as notes that were available from care everywhere and other healthcare systems.  Past medical history, social, surgical and family history all reviewed in electronic medical record.  No pertanent information unless stated regarding to the chief complaint.   Past Medical History:  Diagnosis Date   ADD (attention deficit disorder)    Anxiety    Narcolepsy through school    No Known Allergies   Review of Systems:  No headache, visual changes, nausea, vomiting, diarrhea, constipation, dizziness, abdominal pain, skin rash, fevers, chills, night sweats, weight loss, swollen lymph nodes, body aches, joint swelling, chest pain, shortness of breath, mood changes. POSITIVE muscle  aches  Objective  Blood pressure 122/88, pulse 98, height 6' (1.829 m), weight 272 lb (123.4 kg), SpO2 98 %.   General: No apparent distress alert and oriented x3 mood and affect normal, dressed appropriately.  HEENT: Pupils equal, extraocular movements intact  Respiratory: Patient's speak in full sentences and does not appear short of breath  Cardiovascular: No lower extremity edema, non tender, no erythema  Neuro: Cranial nerves II through XII are intact, neurovascularly intact in all extremities with 2+ DTRs and 2+ pulses.  Gait normal with good balance and coordination.  MSK:  Non tender with full range of motion and good stability and symmetric strength and tone of shoulders, elbows, wrist, hip, knee and ankles bilaterally.  Back -low back exam still has significant tightness noted of the left leg with straight leg test.  Does have some radicular symptoms in the L5 distribution.  Very minimal weakness compared to the contralateral side.  Deep tendon reflexes do appear to be intact.  Patient does have significant tightness noted in the parascapular region right greater than left.  Tightness of the neck noted as well.  Negative Spurling's of the neck.  Osteopathic findings  C2 flexed rotated and side bent right C6 flexed rotated and side bent left T3 extended rotated and side bent right inhaled rib T9 extended rotated and side bent left       Assessment and Plan:  Acute left lumbar radiculopathy Patient continues to have some intermittent radicular.  Has responded extremely well though to the epidural injections.  Patient has an MRI showing the L4-L5 disc protrusion causing a potentially left greater  than right foraminal narrowing.  Patient has now had a total of 4 epidurals.  Discussed icing regimen and home exercises.  Because patient continues to have this I would like patient to have a nerve conduction study to further evaluate the health of the nerve.  Depending on the findings of  this we will decide if patient does need to do more drastic measures such as potential surgical intervention for more of a along improvement.  Hopefully the patient will do well.  Avoided osteopathic manipulation of the lower back but did respond extremely well to the upper back.  No change in the medications.  Doing well with the Cymbalta at the moment.  Follow-up with me again in 4 weeks otherwise.  Scheuermann's disease Chronic and more in the thoracic area.  Patient is responding well to manipulation.  Responded extremely well today.  Discussed more band exercises that I think will be beneficial.  Follow-up with me again in 6 to 8 weeks.   Nonallopathic problems  Decision today to treat with OMT was based on Physical Exam  After verbal consent patient was treated with HVLA, ME, FPR techniques in cervical, rib, thoracic areas  Patient tolerated the procedure well with improvement in symptoms  Patient given exercises, stretches and lifestyle modifications  See medications in patient instructions if given  Patient will follow up in 4-8 weeks     The above documentation has been reviewed and is accurate and complete [name], DO        Note: This dictation was prepared with Dragon dictation along with smaller phrase technology. Any transcriptional errors that result from this process are unintentional.

## 2021-02-16 ENCOUNTER — Other Ambulatory Visit: Payer: Self-pay

## 2021-02-16 ENCOUNTER — Ambulatory Visit (INDEPENDENT_AMBULATORY_CARE_PROVIDER_SITE_OTHER): Payer: 59 | Admitting: Family Medicine

## 2021-02-16 ENCOUNTER — Encounter: Payer: Self-pay | Admitting: Neurology

## 2021-02-16 VITALS — BP 122/88 | HR 98 | Ht 72.0 in | Wt 272.0 lb

## 2021-02-16 DIAGNOSIS — M9902 Segmental and somatic dysfunction of thoracic region: Secondary | ICD-10-CM

## 2021-02-16 DIAGNOSIS — R202 Paresthesia of skin: Secondary | ICD-10-CM

## 2021-02-16 DIAGNOSIS — M9901 Segmental and somatic dysfunction of cervical region: Secondary | ICD-10-CM

## 2021-02-16 DIAGNOSIS — M9908 Segmental and somatic dysfunction of rib cage: Secondary | ICD-10-CM

## 2021-02-16 DIAGNOSIS — M4200 Juvenile osteochondrosis of spine, site unspecified: Secondary | ICD-10-CM

## 2021-02-16 DIAGNOSIS — M5416 Radiculopathy, lumbar region: Secondary | ICD-10-CM

## 2021-02-16 DIAGNOSIS — R200 Anesthesia of skin: Secondary | ICD-10-CM

## 2021-02-16 NOTE — Assessment & Plan Note (Signed)
Patient continues to have some intermittent radicular.  Has responded extremely well though to the epidural injections.  Patient has an MRI showing the L4-L5 disc protrusion causing a potentially left greater than right foraminal narrowing.  Patient has now had a total of 4 epidurals.  Discussed icing regimen and home exercises.  Because patient continues to have this I would like patient to have a nerve conduction study to further evaluate the health of the nerve.  Depending on the findings of this we will decide if patient does need to do more drastic measures such as potential surgical intervention for more of a along improvement.  Hopefully the patient will do well.  Avoided osteopathic manipulation of the lower back but did respond extremely well to the upper back.  No change in the medications.  Doing well with the Cymbalta at the moment.  Follow-up with me again in 4 weeks otherwise.

## 2021-02-16 NOTE — Assessment & Plan Note (Signed)
Chronic and more in the thoracic area.  Patient is responding well to manipulation.  Responded extremely well today.  Discussed more band exercises that I think will be beneficial.  Follow-up with me again in 6 to 8 weeks.

## 2021-02-16 NOTE — Patient Instructions (Signed)
EMG LE-They will call you See me in 4 weeks

## 2021-02-17 ENCOUNTER — Other Ambulatory Visit: Payer: Self-pay | Admitting: Family Medicine

## 2021-02-22 ENCOUNTER — Other Ambulatory Visit: Payer: Self-pay

## 2021-02-22 ENCOUNTER — Ambulatory Visit: Payer: BC Managed Care – PPO | Admitting: Neurology

## 2021-02-22 DIAGNOSIS — R202 Paresthesia of skin: Secondary | ICD-10-CM | POA: Diagnosis not present

## 2021-02-22 DIAGNOSIS — M5417 Radiculopathy, lumbosacral region: Secondary | ICD-10-CM

## 2021-02-22 NOTE — Procedures (Signed)
[facility]  [address] [place], [address]  [place], [place] [address] Tel: [phone] Fax:  [phone] Test Date:  02/22/2021  Patient: Frank [name] DOB: 03/15/1990 Physician: Nita Sickle, DO  Sex: Male Height: 6' " Ref Phys: Antoine Primas, DO  ID#: [id number]   Technician:    Patient Complaints:   NCV & EMG Findings: This is a 31 year old man referred for evaluation of left leg paresthesias.  NCV & EMG Findings: Extensive electrodiagnostic testing of the left lower extremity and additional studies of the right shows:  Left superficial peroneal sensory response is asymmetrically reduced (3.5 V), likely due to proximal dorsal root ganglion Left sural sensory responses within normal limits. Left peroneal motor response shows asymmetrically reduced amplitude at the extensor digitorum brevis.  (L2.4 mV).  Left peroneal motor response at the tibialis anterior and tibial motor responses within normal limits. Left tibial H reflex studies within normal limits. Sparse chronic motor axonal loss changes are seen affecting the L5 myotome on the left, without accompanied active denervation.  Impression: Chronic L5 radiculopathy affecting the left lower extremity, mild.   ___________________________ Nita Sickle, DO    Nerve Conduction Studies Anti Sensory Summary Table   Stim Site NR Peak (ms) Norm Peak (ms) P-T Amp (V) Norm P-T Amp  Left Sup Peroneal Anti Sensory (Ant Lat Mall)  34C  12 cm    3.3 <4.4 3.5 >6  Right Sup Peroneal Anti Sensory (Ant Lat Mall)  34C  12 cm    3.0 <4.4 6.7 >6  Left Sural Anti Sensory (Lat Mall)  34C  Calf    3.5 <4.4 7.8 >6   Motor Summary Table   Stim Site NR Onset (ms) Norm Onset (ms) O-P Amp (mV) Norm O-P Amp Site1 Site2 Delta-0 (ms) Dist (cm) Vel (m/s) Norm Vel (m/s)  Left Peroneal Motor (Ext Dig Brev)  34C  Ankle    3.5 <5.5 2.4 >3 B Fib Ankle 8.5 40.0 47 >41  B Fib    12.0  1.8  Poplt B Fib 1.6 8.0 50 >41  Poplt    13.6  1.7          Right Peroneal Motor (Ext Dig Brev)  34C  Ankle    3.8 <5.5 5.5 >3 B Fib Ankle 8.4 40.0 48 >41  B Fib    12.2  5.4  Poplt B Fib 1.4 8.0 57 >41  Poplt    13.6  5.1         Left Peroneal TA Motor (Tib Ant)  34C  Fib Head    2.5 <4.0 7.0 >4 Poplit Fib Head 1.4 8.0 57 >41  Poplit    3.9  6.4         Left Tibial Motor (Abd Hall Brev)  34C  Ankle    4.6 <5.8 17.5 >8 Knee Ankle 8.8 44.0 50 >41  Knee    13.4  15.4          H Reflex Studies   NR H-Lat (ms) Lat Norm (ms) L-R H-Lat (ms)  Left Tibial (Gastroc)  34C     34.83 <35    EMG   Side Muscle Ins Act Fibs Psw Fasc Number Recrt Dur Dur. Amp Amp. Poly Poly. Comment  Left AntTibialis Nml Nml Nml Nml 1- Rapid Few 1+ Few 1+ Few 1+ N/A  Left Gastroc Nml Nml Nml Nml Nml Nml Nml Nml Nml Nml Nml Nml N/A  Left Flex Dig Long Nml Nml Nml Nml 1-  Rapid Some 1+ Some 1+ Some 1+ N/A  Left RectFemoris Nml Nml Nml Nml Nml Nml Nml Nml Nml Nml Nml Nml N/A  Left GluteusMed Nml Nml Nml Nml 1- Rapid Some 1+ Some 1+ Some 1+ N/A      Waveforms:

## 2021-02-23 ENCOUNTER — Other Ambulatory Visit: Payer: Self-pay

## 2021-02-23 ENCOUNTER — Encounter: Payer: Self-pay | Admitting: Family Medicine

## 2021-02-23 DIAGNOSIS — M5134 Other intervertebral disc degeneration, thoracic region: Secondary | ICD-10-CM

## 2021-02-23 DIAGNOSIS — M4200 Juvenile osteochondrosis of spine, site unspecified: Secondary | ICD-10-CM

## 2021-03-07 DIAGNOSIS — M5416 Radiculopathy, lumbar region: Secondary | ICD-10-CM | POA: Diagnosis not present

## 2021-03-07 DIAGNOSIS — M542 Cervicalgia: Secondary | ICD-10-CM | POA: Diagnosis not present

## 2021-03-07 DIAGNOSIS — M546 Pain in thoracic spine: Secondary | ICD-10-CM | POA: Diagnosis not present

## 2021-03-07 DIAGNOSIS — M545 Low back pain, unspecified: Secondary | ICD-10-CM | POA: Diagnosis not present

## 2021-03-07 NOTE — Progress Notes (Signed)
[facility] Phone: [phone] Subjective:   Frank [name], am serving as a scribe for Dr. Hulan Saas. This visit occurred during the SARS-CoV-2 public health emergency.  Safety protocols were in place, including screening questions prior to the visit, additional usage of staff PPE, and extensive cleaning of exam room while observing appropriate contact time as indicated for disinfecting solutions.   I'm seeing this patient by the request  of:  Harrison Mons, PA  CC: Back and neck pain follow-up  [id number]  Frank [name] is a 31 y.o. male coming in with complaint of back and neck pain. OMT 02/16/2021. Patient states that his nerve pain with sitting is worse. Is going to get an MRI through [facility].  Patient states that they are going to look in the imaging and then decide what else needs to be potentially done.  Patient once again has responded well to injections but they have been short-term.  Medications patient has been prescribed: Synthroid  Taking:         Reviewed prior external information including notes and imaging from previsou exam, outside providers and external EMR if available.   As well as notes that were available from care everywhere and other healthcare systems.  Past medical history, social, surgical and family history all reviewed in electronic medical record.  [name] pertanent information unless stated regarding to the chief complaint.   Past Medical History:  Diagnosis Date   ADD (attention deficit disorder)    Anxiety    Narcolepsy through school    [name] Known Allergies   Review of Systems:  [name] headache, visual changes, nausea, vomiting, diarrhea, constipation, dizziness, abdominal pain, skin rash, fevers, chills, night sweats, weight loss, swollen lymph nodes, body aches, joint swelling, chest pain, shortness of breath, mood changes. POSITIVE muscle aches  Objective   Blood pressure 120/88, pulse 94, height 6' (1.829 m), weight 274 lb (124.3 kg), SpO2 98 %.   General: [name] apparent distress alert and oriented x3 mood and affect normal, dressed appropriately.  HEENT: Pupils equal, extraocular movements intact  Respiratory: Patient's speak in full sentences and does not appear short of breath  Cardiovascular: [name] lower extremity edema, non tender, [name] erythema  Low back exam does have significant loss of lordosis.  Some tenderness to palpation of the paraspinal musculature.  Patient does have a positive straight leg test on the left side.  Does have some mild kyphosis of the upper back.  Weakness noted of the left leg compared to the right significant increased tightness in the left paraspinal musculature of the lumbar spine on the left side than patient's baseline  Osteopathic findings  C2 flexed rotated and side bent right C6 flexed rotated and side bent left T3 extended rotated and side bent right inhaled rib T7 extended rotated and side bent left L2 flexed rotated and side bent left L5 flexed rotated and side bent left Sacrum right on right       Assessment and Plan: Acute left lumbar radiculopathy Continues to have a positive straight leg test on the left side.  Avoided significant HVLA on the low back.  Patient has some very mild weakness noted with hip flexion compared to the contralateral side.  Increase tightness noted as well with FABER test.  Patient is scheduled for MRIs from an outside facility and we will see what those show.  Patient will continue to follow-up with the orthopedic surgeon as well  after imaging to discuss further.  Follow-up with me again in 4 to 5 weeks.  [name] significant change in medication.  Patient is a chronic problem with exacerbation.    Nonallopathic problems  Decision today to treat with OMT was based on Physical Exam  After verbal consent patient was treated with , ME, FPR techniques in cervical, rib, thoracic,  lumbar, and sacral  areas avoided HVLA secondary to the tightness noted on exam today.  Patient tolerated the procedure well with improvement in symptoms  Patient given exercises, stretches and lifestyle modifications  See medications in patient instructions if given  Patient will follow up in 4-8 weeks      The above documentation has been reviewed and is accurate and complete Lyndal Pulley, DO       Note: This dictation was prepared with Dragon dictation along with smaller phrase technology. Any transcriptional errors that result from this process are unintentional.

## 2021-03-08 ENCOUNTER — Other Ambulatory Visit: Payer: Self-pay | Admitting: Orthopedic Surgery

## 2021-03-08 DIAGNOSIS — M549 Dorsalgia, unspecified: Secondary | ICD-10-CM

## 2021-03-09 ENCOUNTER — Ambulatory Visit: Payer: BC Managed Care – PPO | Admitting: Family Medicine

## 2021-03-09 ENCOUNTER — Other Ambulatory Visit: Payer: Self-pay

## 2021-03-09 ENCOUNTER — Encounter: Payer: Self-pay | Admitting: Family Medicine

## 2021-03-09 VITALS — BP 120/88 | HR 94 | Ht 72.0 in | Wt 274.0 lb

## 2021-03-09 DIAGNOSIS — M9903 Segmental and somatic dysfunction of lumbar region: Secondary | ICD-10-CM

## 2021-03-09 DIAGNOSIS — M5416 Radiculopathy, lumbar region: Secondary | ICD-10-CM | POA: Diagnosis not present

## 2021-03-09 DIAGNOSIS — M9904 Segmental and somatic dysfunction of sacral region: Secondary | ICD-10-CM

## 2021-03-09 DIAGNOSIS — M9902 Segmental and somatic dysfunction of thoracic region: Secondary | ICD-10-CM

## 2021-03-09 DIAGNOSIS — M9901 Segmental and somatic dysfunction of cervical region: Secondary | ICD-10-CM | POA: Diagnosis not present

## 2021-03-09 DIAGNOSIS — M9908 Segmental and somatic dysfunction of rib cage: Secondary | ICD-10-CM

## 2021-03-09 NOTE — Assessment & Plan Note (Signed)
Continues to have a positive straight leg test on the left side.  Avoided significant HVLA on the low back.  Patient has some very mild weakness noted with hip flexion compared to the contralateral side.  Increase tightness noted as well with FABER test.  Patient is scheduled for MRIs from an outside facility and we will see what those show.  Patient will continue to follow-up with the orthopedic surgeon as well after imaging to discuss further.  Follow-up with me again in 4 to 5 weeks.  No significant change in medication.  Patient is a chronic problem with exacerbation.

## 2021-03-09 NOTE — Patient Instructions (Signed)
Lets see what the MRI shows Listen to your body Try dry needling tomorrow with [place] See you again in 5 weeks

## 2021-03-17 ENCOUNTER — Other Ambulatory Visit: Payer: Self-pay | Admitting: Family Medicine

## 2021-03-19 ENCOUNTER — Ambulatory Visit
Admission: RE | Admit: 2021-03-19 | Discharge: 2021-03-19 | Disposition: A | Discharge status: Home / Self Care | Payer: BC Managed Care – PPO | Source: Ambulatory Visit | Attending: Orthopedic Surgery | Admitting: Orthopedic Surgery

## 2021-03-19 ENCOUNTER — Other Ambulatory Visit: Payer: Self-pay

## 2021-03-19 DIAGNOSIS — M549 Dorsalgia, unspecified: Secondary | ICD-10-CM

## 2021-03-19 DIAGNOSIS — M48061 Spinal stenosis, lumbar region without neurogenic claudication: Secondary | ICD-10-CM | POA: Diagnosis not present

## 2021-03-19 DIAGNOSIS — M40204 Unspecified kyphosis, thoracic region: Secondary | ICD-10-CM | POA: Diagnosis not present

## 2021-03-19 DIAGNOSIS — R200 Anesthesia of skin: Secondary | ICD-10-CM | POA: Diagnosis not present

## 2021-03-19 DIAGNOSIS — M545 Low back pain, unspecified: Secondary | ICD-10-CM | POA: Diagnosis not present

## 2021-03-19 DIAGNOSIS — M2578 Osteophyte, vertebrae: Secondary | ICD-10-CM | POA: Diagnosis not present

## 2021-03-28 DIAGNOSIS — M5416 Radiculopathy, lumbar region: Secondary | ICD-10-CM | POA: Diagnosis not present

## 2021-03-28 NOTE — Progress Notes (Signed)
[name] [address] [place] [address] Phone: [phone] Subjective:   Frank [name], am serving as a scribe for Dr. Antoine Primas. This visit occurred during the SARS-CoV-2 public health emergency.  Safety protocols were in place, including screening questions prior to the visit, additional usage of staff PPE, and extensive cleaning of exam room while observing appropriate contact time as indicated for disinfecting solutions.  I'm seeing this patient by the request  of:  Porfirio Oar, PA  CC: Back and neck pain follow-up  [id number]  Frank [name] is a 31 y.o. male coming in with complaint of back and neck pain. OMT 03/09/2021. Patient states that he has no new pain since last visit. Had MRI from Dr. Yevette Edwards.  Did discuss with him that there has been some progression with the back at this moment.  Did discuss the possibility of surgical intervention.  At this moment would like to continue with the injections.  Patient does concern because we did not find an answer for why he continues to have intermittent radicular symptoms.   Medications patient has been prescribed: Synthroid  Taking:      Reviewed prior external information including notes and imaging from previsou exam, outside providers and external EMR if available.   As well as notes that were available from care everywhere and other healthcare systems.  Past medical history, social, surgical and family history all reviewed in electronic medical record.  No pertanent information unless stated regarding to the chief complaint.   Past Medical History:  Diagnosis Date   ADD (attention deficit disorder)    Anxiety    Narcolepsy through school    No Known Allergies   Review of Systems:  No headache, visual changes, nausea, vomiting, diarrhea, constipation, dizziness, abdominal pain, skin rash, fevers, chills, night sweats, weight loss, swollen lymph nodes, body aches,  joint swelling, chest pain, shortness of breath, mood changes. POSITIVE muscle aches, body aches  Objective  Blood pressure 124/88, pulse 84, height 6' (1.829 m), weight 262 lb (118.8 kg), SpO2 98 %.   General: No apparent distress alert and oriented x3 mood and affect normal, dressed appropriately.  HEENT: Pupils equal, extraocular movements intact  Respiratory: Patient's speak in full sentences and does not appear short of breath  Cardiovascular: No lower extremity edema, non tender, no erythema  Back exam does have significant tightness noted.  Positive straight leg test on the left side.  Patient has 4+ out of 5 strength with the dorsiflexion of the foot.  Patient has difficulty with moving of the back at the moment.  Patient's mid back significant tightness in the left parascapular region neck exam does have loss of lordosis.  No true radicular symptoms the patient's grip strength may be seen with minorly weak but very minimal and symmetric.  Osteopathic findings  C2 flexed rotated and side bent right C6 flexed rotated and side bent left T9 extended rotated and side bent left inhaled rib L2 flexed rotated and side bent right Sacrum right on right       Assessment and Plan:  Acute left lumbar radiculopathy Continues to have radicular symptoms.  Did discuss with back specialist about the possibility of surgical intervention.  At this moment we will continue with conservative therapy and repeating the epidural.  I do think that this is a good cause.  We discussed the medications still with the Zanaflex, Cymbalta as well as the nortriptyline.  Patient has had some extra  stress is well in his life that likely has contributed.  Follow-up with me again 4 to 6 weeks Toradol and Depo-Medrol given today as well for this acute exacerbation of a chronic underlying condition.  Trigger point of neck Patient is complaining of intermittent radicular symptoms to the upper extremities.  Patient  states sometimes it feels like he does have some weakness.  Patient said previous imaging though did not show anything significantly on the cervical spine.  At this moment do not I would like to consider the possibility of a bilateral upper extremity and nerve conduction.  Discussed with patient about the possibility of such things in the differential such as carpal tunnel, but no canal impingement or further cervical radiculopathy that could be contributing.  No significant weakness. Discussed at this moment do not have a great answer for why this is potentially occurring so we will get further imaging to discuss.   Nonallopathic problems  Decision today to treat with OMT was based on Physical Exam  After verbal consent patient was treated with HVLA, ME, FPR techniques in cervical, rib, thoracic, lumbar, and sacral  areas  Patient tolerated the procedure well with improvement in symptoms  Patient given exercises, stretches and lifestyle modifications  See medications in patient instructions if given  Patient will follow up in 4-8 weeks      The above documentation has been reviewed and is accurate and complete [name], DO        Note: This dictation was prepared with Dragon dictation along with smaller phrase technology. Any transcriptional errors that result from this process are unintentional.

## 2021-03-30 ENCOUNTER — Ambulatory Visit: Payer: BC Managed Care – PPO | Admitting: Family Medicine

## 2021-03-30 ENCOUNTER — Other Ambulatory Visit: Payer: Self-pay

## 2021-03-30 ENCOUNTER — Encounter: Payer: Self-pay | Admitting: Family Medicine

## 2021-03-30 VITALS — BP 124/88 | HR 84 | Ht 72.0 in | Wt 262.0 lb

## 2021-03-30 DIAGNOSIS — M9902 Segmental and somatic dysfunction of thoracic region: Secondary | ICD-10-CM

## 2021-03-30 DIAGNOSIS — M545 Low back pain, unspecified: Secondary | ICD-10-CM

## 2021-03-30 DIAGNOSIS — M9904 Segmental and somatic dysfunction of sacral region: Secondary | ICD-10-CM

## 2021-03-30 DIAGNOSIS — M5134 Other intervertebral disc degeneration, thoracic region: Secondary | ICD-10-CM

## 2021-03-30 DIAGNOSIS — M9903 Segmental and somatic dysfunction of lumbar region: Secondary | ICD-10-CM

## 2021-03-30 DIAGNOSIS — M25529 Pain in unspecified elbow: Secondary | ICD-10-CM | POA: Diagnosis not present

## 2021-03-30 DIAGNOSIS — M5416 Radiculopathy, lumbar region: Secondary | ICD-10-CM | POA: Diagnosis not present

## 2021-03-30 DIAGNOSIS — G8929 Other chronic pain: Secondary | ICD-10-CM

## 2021-03-30 DIAGNOSIS — R0683 Snoring: Secondary | ICD-10-CM

## 2021-03-30 DIAGNOSIS — M9901 Segmental and somatic dysfunction of cervical region: Secondary | ICD-10-CM | POA: Diagnosis not present

## 2021-03-30 DIAGNOSIS — M9908 Segmental and somatic dysfunction of rib cage: Secondary | ICD-10-CM | POA: Diagnosis not present

## 2021-03-30 MED ORDER — KETOROLAC TROMETHAMINE 60 MG/2ML IM SOLN
60.0000 mg | Freq: Once | INTRAMUSCULAR | Status: AC
  Administered 2021-03-30: 60 mg via INTRAMUSCULAR

## 2021-03-30 MED ORDER — METHYLPREDNISOLONE ACETATE 80 MG/ML IJ SUSP
80.0000 mg | Freq: Once | INTRAMUSCULAR | Status: AC
  Administered 2021-03-30: 80 mg via INTRAMUSCULAR

## 2021-03-30 NOTE — Patient Instructions (Addendum)
Injections in backside Physical therapy referral [facility] will call you Epidural- [phone] See me in 4-6 weeks

## 2021-03-30 NOTE — Addendum Note (Signed)
Addended by: Evon Slack on: 03/30/2021 08:49 AM   Modules accepted: Orders

## 2021-03-30 NOTE — Assessment & Plan Note (Signed)
Patient is complaining of intermittent radicular symptoms to the upper extremities.  Patient states sometimes it feels like he does have some weakness.  Patient said previous imaging though did not show anything significantly on the cervical spine.  At this moment do not I would like to consider the possibility of a bilateral upper extremity and nerve conduction.  Discussed with patient about the possibility of such things in the differential such as carpal tunnel, but no canal impingement or further cervical radiculopathy that could be contributing.  No significant weakness. Discussed at this moment do not have a great answer for why this is potentially occurring so we will get further imaging to discuss.

## 2021-03-30 NOTE — Assessment & Plan Note (Signed)
Continues to have radicular symptoms.  Did discuss with back specialist about the possibility of surgical intervention.  At this moment we will continue with conservative therapy and repeating the epidural.  I do think that this is a good cause.  We discussed the medications still with the Zanaflex, Cymbalta as well as the nortriptyline.  Patient has had some extra stress is well in his life that likely has contributed.  Follow-up with me again 4 to 6 weeks Toradol and Depo-Medrol given today as well for this acute exacerbation of a chronic underlying condition.

## 2021-04-12 ENCOUNTER — Other Ambulatory Visit: Payer: Self-pay | Admitting: Family Medicine

## 2021-04-12 ENCOUNTER — Other Ambulatory Visit: Payer: Self-pay

## 2021-04-12 ENCOUNTER — Ambulatory Visit
Admission: RE | Admit: 2021-04-12 | Discharge: 2021-04-12 | Disposition: A | Discharge status: Home / Self Care | Payer: BC Managed Care – PPO | Source: Ambulatory Visit | Attending: Family Medicine | Admitting: Family Medicine

## 2021-04-12 DIAGNOSIS — M545 Low back pain, unspecified: Secondary | ICD-10-CM

## 2021-04-12 DIAGNOSIS — M5416 Radiculopathy, lumbar region: Secondary | ICD-10-CM | POA: Diagnosis not present

## 2021-04-12 MED ORDER — METHYLPREDNISOLONE ACETATE 40 MG/ML INJ SUSP (RADIOLOG
80.0000 mg | Freq: Once | INTRAMUSCULAR | Status: AC
  Administered 2021-04-12: 80 mg via EPIDURAL

## 2021-04-12 MED ORDER — IOPAMIDOL (ISOVUE-M 200) INJECTION 41%
1.0000 mL | Freq: Once | INTRAMUSCULAR | Status: AC
  Administered 2021-04-12: 1 mL via EPIDURAL

## 2021-04-12 NOTE — Discharge Instructions (Signed)

## 2021-04-13 ENCOUNTER — Other Ambulatory Visit: Payer: Self-pay

## 2021-04-13 DIAGNOSIS — R202 Paresthesia of skin: Secondary | ICD-10-CM

## 2021-04-14 ENCOUNTER — Other Ambulatory Visit: Payer: Self-pay

## 2021-04-14 ENCOUNTER — Ambulatory Visit: Payer: BC Managed Care – PPO | Admitting: Neurology

## 2021-04-14 DIAGNOSIS — R202 Paresthesia of skin: Secondary | ICD-10-CM | POA: Diagnosis not present

## 2021-04-14 NOTE — Procedures (Signed)
[facility]  ?[address] ? [place], [place] [address] ?Tel: [phone] ?Fax:  [phone] ?Test Date:  04/14/2021 ? ?Patient: Frank [name] DOB: 12/23/1990 Physician: Nita Sickle, DO  ?Sex: Male Height: 6' " Ref Phys: Antoine Primas, DO  ?ID#: [id number]   Technician:   ? ?Patient Complaints: ?This is a 31 year old man referred for evaluation of bilateral arm pain and paresthesias. ? ?NCV & EMG Findings: ?Extensive electrodiagnostic testing of the right upper extremity and additional studies of the left shows: ?Bilateral median, ulnar, and mixed palmar sensory responses are within normal limits. ?Bilateral median and ulnar motor responses are within normal limits. ?There is no evidence of active or chronic motor axonal loss changes affecting any of the tested muscles.  Motor unit configuration and recruitment pattern is within normal limits. ? ?Impression: ?This is a normal study of the upper extremities.  In particular, there is no evidence of carpal tunnel syndrome or a cervical radiculopathy. ? ? ?___________________________ ?Nita Sickle, DO ? ? ? ?Nerve Conduction Studies ?Anti Sensory Summary Table ? ? Stim Site NR Peak (ms) Norm Peak (ms) P-T Amp (?V) Norm P-T Amp  ?Left Median Anti Sensory (2nd Digit)  37?C  ?Wrist    2.9 <3.3 30.6 >20  ?Right Median Anti Sensory (2nd Digit)  37?C  ?Wrist    2.9 <3.3 34.6 >20  ?Left Ulnar Anti Sensory (5th Digit)  37?C  ?Wrist    2.7 <3.0 31.9 >18  ?Right Ulnar Anti Sensory (5th Digit)  37?C  ?Wrist    2.5 <3.0 35.5 >18  ? ?Motor Summary Table ? ? Stim Site NR Onset (ms) Norm Onset (ms) O-P Amp (mV) Norm O-P Amp Site1 Site2 Delta-0 (ms) Dist (cm) Vel (m/s) Norm Vel (m/s)  ?Left Median Motor (Abd Poll Brev)  37?C  ?Wrist    3.0 <3.9 13.3 >6 Elbow Wrist 4.9 30.0 61 >51  ?Elbow    7.9  13.0         ?Right Median Motor (Abd Poll Brev)  37?C  ?Wrist    3.0 <3.9 13.9 >6 Elbow Wrist 4.7 31.0 66 >51  ?Elbow    7.7  13.6         ?Left Ulnar Motor (Abd Dig  Minimi)  37?C  ?Wrist    2.1 <3.0 12.3 >8 B Elbow Wrist 3.8 26.0 68 >51  ?B Elbow    5.9  11.8  A Elbow B Elbow 1.5 10.0 67 >51  ?A Elbow    7.4  11.5         ?Right Ulnar Motor (Abd Dig Minimi)  37?C  ?Wrist    2.2 <3.0 12.9 >8 B Elbow Wrist 3.9 24.0 62 >51  ?B Elbow    6.1  12.6  A Elbow B Elbow 1.4 10.0 71 >51  ?A Elbow    7.5  12.1         ? ?Comparison Summary Table ? ? Stim Site NR Peak (ms) Norm Peak (ms) P-T Amp (?V) Site1 Site2 Delta-P (ms) Norm Delta (ms)  ?Left Median/Ulnar Palm Comparison (Wrist - 8cm)  37?C  ?Median Palm    1.8 <2.2 71.7 Median Palm Ulnar Palm 0.2   ?Ulnar Palm    1.6 <2.2 16.0      ?Right Median/Ulnar Palm Comparison (Wrist - 8cm)  37?C  ?Median Palm    1.8 <2.2 69.3 Median Palm Ulnar Palm 0.2   ?Ulnar Palm    1.6 <2.2 11.6      ? ?  EMG ? ? Side Muscle Ins Act Fibs Psw Fasc Number Recrt Dur Dur. Amp Amp. Poly Poly. Comment  ?Left 1stDorInt Nml Nml Nml Nml Nml Nml Nml Nml Nml Nml Nml Nml N/A  ?Left PronatorTeres Nml Nml Nml Nml Nml Nml Nml Nml Nml Nml Nml Nml N/A  ?Left Biceps Nml Nml Nml Nml Nml Nml Nml Nml Nml Nml Nml Nml N/A  ?Left Triceps Nml Nml Nml Nml Nml Nml Nml Nml Nml Nml Nml Nml N/A  ?Left Deltoid Nml Nml Nml Nml Nml Nml Nml Nml Nml Nml Nml Nml N/A  ?Right 1stDorInt Nml Nml Nml Nml Nml Nml Nml Nml Nml Nml Nml Nml N/A  ?Right PronatorTeres Nml Nml Nml Nml Nml Nml Nml Nml Nml Nml Nml Nml N/A  ?Right Biceps Nml Nml Nml Nml Nml Nml Nml Nml Nml Nml Nml Nml N/A  ?Right Triceps Nml Nml Nml Nml Nml Nml Nml Nml Nml Nml Nml Nml N/A  ?Right Deltoid Nml Nml Nml Nml Nml Nml Nml Nml Nml Nml Nml Nml N/A  ? ? ? ? ?Waveforms: ?    ? ?    ? ?    ? ?  ? ? ?

## 2021-04-14 NOTE — Progress Notes (Signed)
?Frank [name] D.O. ?[facility] ?[address] [place] [address] ?Phone: [phone] ?Subjective:   ?I, Frank [name], am serving as a scribe for Dr. Antoine [name]. ? ?This visit occurred during the SARS-CoV-2 public health emergency.  Safety protocols were in place, including screening questions prior to the visit, additional usage of staff PPE, and extensive cleaning of exam room while observing appropriate contact time as indicated for disinfecting solutions.  ? ? ?I'm seeing this patient by the request  of:  Porfirio Oar, PA ? ?CC: Back and neck pain follow-up ? ?[id number]  ?Frank [name] is a 31 y.o. male coming in with complaint of back and neck pain. OMT 03/30/2021. Patient states that his leg is not much better since injection. Had it 7 days ago. First 2 days he felt great. Unable to foam roll lower 3 vertebrae. Upper back pain is tight as he has not been working out.  Patient has no discontinued his opiates at this point.  Patient is trying to [name] off of them for longer aspect. ?Patient did have a friend die that has made him reevaluate his medications.. ? ?Medications patient has been prescribed: Cymbalta, Synthroid ? ?Taking: ? ? ?  ? ? ? ? ?Reviewed prior external information including notes and imaging from previsou exam, outside providers and external EMR if available.  ? ?As well as notes that were available from care everywhere and other healthcare systems. ? ?Past medical history, social, surgical and family history all reviewed in electronic medical record.  No pertanent information unless stated regarding to the chief complaint.  ? ?Past Medical History:  ?Diagnosis Date  ? ADD (attention deficit disorder)   ? Anxiety   ? Narcolepsy through school  ?  ?No Known Allergies ? ? ?Review of Systems: ? No headache, visual changes, nausea, vomiting, diarrhea, constipation, dizziness, abdominal pain, skin rash, fevers, chills, night sweats, weight loss, swollen lymph nodes,  joint swelling, chest pain, shortness of breath, mood changes. POSITIVE muscle aches, body aches ? ?Objective  ?Blood pressure 122/88, pulse 88, height 6' (1.829 m), weight 266 lb (120.7 kg), SpO2 98 %. ?  ?General: No apparent distress alert and oriented x3 mood and affect normal, dressed appropriately.  ?HEENT: Pupils equal, extraocular movements intact  ?Respiratory: Patient's speak in full sentences and does not appear short of breath  ?Cardiovascular: No lower extremity edema, non tender, no erythema  ?Low back exam does have some loss of lordosis.  Some tenderness to palpation in the paraspinal musculature.  Patient pain is out of proportion to the amount of palpation. ? ?Osteopathic findings ? ?C2 flexed rotated and side bent right ?C6 flexed rotated and side bent left ?T3 extended rotated and side bent right inhaled rib ?T9 extended rotated and side bent left ?L2 flexed rotated and side bent right ?Sacrum right on right ? ? ? ? ?  ?Assessment and Plan: ? ?Acute left lumbar radiculopathy ?Chronic, but is improved after the last injection.  We discussed which activities to do and which ones to avoid.  We will continue to work on core strengthening but want patient to avoid any type of excessive lifting for another 10 days at least.  Discussed icing regimen and home exercises.  Increase activity slowly.  Follow-up again in 6 to 8 weeks ? ?Chronic prescription opiate use ?Patient does see primary care physician for this but has started to decrease the amount he is using.  Does have the Cymbalta 60 mg and is doing  relatively well with this as well as the muscle relaxer.  ? ?Nonallopathic problems ? ?Decision today to treat with OMT was based on Physical Exam ? ?After verbal consent patient was treated with HVLA, ME, FPR techniques in cervical, rib, thoracic, lumbar, and sacral  areas ? ?Patient tolerated the procedure well with improvement in symptoms ? ?Patient given exercises, stretches and lifestyle  modifications ? ?See medications in patient instructions if given ? ?Patient will follow up in 4-8 weeks ? ?  ? ?The above documentation has been reviewed and is accurate and complete [name], DO ? ? ? ?  ? ? Note: This dictation was prepared with Dragon dictation along with smaller phrase technology. Any transcriptional errors that result from this process are unintentional.    ?  ?  ? ?

## 2021-04-20 ENCOUNTER — Ambulatory Visit: Payer: BC Managed Care – PPO | Admitting: Family Medicine

## 2021-04-20 ENCOUNTER — Encounter: Payer: Self-pay | Admitting: Family Medicine

## 2021-04-20 VITALS — BP 122/88 | HR 88 | Ht 72.0 in | Wt 266.0 lb

## 2021-04-20 DIAGNOSIS — M9908 Segmental and somatic dysfunction of rib cage: Secondary | ICD-10-CM | POA: Diagnosis not present

## 2021-04-20 DIAGNOSIS — Z79891 Long term (current) use of opiate analgesic: Secondary | ICD-10-CM

## 2021-04-20 DIAGNOSIS — M9904 Segmental and somatic dysfunction of sacral region: Secondary | ICD-10-CM | POA: Diagnosis not present

## 2021-04-20 DIAGNOSIS — M9901 Segmental and somatic dysfunction of cervical region: Secondary | ICD-10-CM | POA: Diagnosis not present

## 2021-04-20 DIAGNOSIS — M9902 Segmental and somatic dysfunction of thoracic region: Secondary | ICD-10-CM | POA: Diagnosis not present

## 2021-04-20 DIAGNOSIS — M9903 Segmental and somatic dysfunction of lumbar region: Secondary | ICD-10-CM | POA: Diagnosis not present

## 2021-04-20 DIAGNOSIS — M5416 Radiculopathy, lumbar region: Secondary | ICD-10-CM

## 2021-04-20 NOTE — Assessment & Plan Note (Signed)
Chronic, but is improved after the last injection.  We discussed which activities to do and which ones to avoid.  We will continue to work on core strengthening but want patient to avoid any type of excessive lifting for another 10 days at least.  Discussed icing regimen and home exercises.  Increase activity slowly.  Follow-up again in 6 to 8 weeks ?

## 2021-04-20 NOTE — Patient Instructions (Signed)
Be proud you're off pain meds ?Keep doing only band work for next 10 days ?Lets keep watching shoulder ?See you again at your next appointment ?

## 2021-04-20 NOTE — Assessment & Plan Note (Signed)
Patient does see primary care physician for this but has started to decrease the amount he is using.  Does have the Cymbalta 60 mg and is doing relatively well with this as well as the muscle relaxer. ?

## 2021-04-21 ENCOUNTER — Other Ambulatory Visit: Payer: Self-pay | Admitting: Family Medicine

## 2021-04-26 DIAGNOSIS — M5135 Other intervertebral disc degeneration, thoracolumbar region: Secondary | ICD-10-CM | POA: Diagnosis not present

## 2021-04-26 DIAGNOSIS — M5136 Other intervertebral disc degeneration, lumbar region: Secondary | ICD-10-CM | POA: Diagnosis not present

## 2021-04-27 DIAGNOSIS — H6980 Other specified disorders of Eustachian tube, unspecified ear: Secondary | ICD-10-CM | POA: Diagnosis not present

## 2021-04-27 DIAGNOSIS — G4700 Insomnia, unspecified: Secondary | ICD-10-CM | POA: Diagnosis not present

## 2021-04-27 DIAGNOSIS — M7918 Myalgia, other site: Secondary | ICD-10-CM | POA: Diagnosis not present

## 2021-04-27 DIAGNOSIS — F988 Other specified behavioral and emotional disorders with onset usually occurring in childhood and adolescence: Secondary | ICD-10-CM | POA: Diagnosis not present

## 2021-04-28 DIAGNOSIS — M5136 Other intervertebral disc degeneration, lumbar region: Secondary | ICD-10-CM | POA: Diagnosis not present

## 2021-04-28 DIAGNOSIS — M5135 Other intervertebral disc degeneration, thoracolumbar region: Secondary | ICD-10-CM | POA: Diagnosis not present

## 2021-05-02 DIAGNOSIS — M5136 Other intervertebral disc degeneration, lumbar region: Secondary | ICD-10-CM | POA: Diagnosis not present

## 2021-05-02 DIAGNOSIS — M5135 Other intervertebral disc degeneration, thoracolumbar region: Secondary | ICD-10-CM | POA: Diagnosis not present

## 2021-05-04 ENCOUNTER — Encounter: Payer: Self-pay | Admitting: Primary Care

## 2021-05-04 ENCOUNTER — Other Ambulatory Visit: Payer: Self-pay

## 2021-05-04 ENCOUNTER — Ambulatory Visit (INDEPENDENT_AMBULATORY_CARE_PROVIDER_SITE_OTHER): Payer: BC Managed Care – PPO | Admitting: Primary Care

## 2021-05-04 VITALS — BP 122/70 | HR 89 | Wt 265.0 lb

## 2021-05-04 DIAGNOSIS — R0683 Snoring: Secondary | ICD-10-CM | POA: Diagnosis not present

## 2021-05-04 DIAGNOSIS — R002 Palpitations: Secondary | ICD-10-CM | POA: Diagnosis not present

## 2021-05-04 LAB — BASIC METABOLIC PANEL
BUN: 13 mg/dL (ref 6–23)
CO2: 31 mEq/L (ref 19–32)
Calcium: 9.3 mg/dL (ref 8.4–10.5)
Chloride: 103 mEq/L (ref 96–112)
Creatinine, Ser: 1.15 mg/dL (ref 0.40–1.50)
GFR: 85.68 mL/min (ref >60.00)
Glucose, Bld: 91 mg/dL (ref 70–99)
Potassium: 3.9 mEq/L (ref 3.5–5.1)
Sodium: 140 mEq/L (ref 135–145)

## 2021-05-04 LAB — CBC
HCT: 46.1 % (ref 39.0–52.0)
Hemoglobin: 15.8 g/dL (ref 13.0–17.0)
MCHC: 34.3 g/dL (ref 30.0–36.0)
MCV: 94.2 fl (ref 78.0–100.0)
Platelets: 176 10*3/uL (ref 150.0–400.0)
RBC: 4.89 Mil/uL (ref 4.22–5.81)
RDW: 13 % (ref 11.5–15.5)
WBC: 8 10*3/uL (ref 4.0–10.5)

## 2021-05-04 LAB — TSH: TSH: 1.74 u[IU]/mL (ref 0.35–5.50)

## 2021-05-04 NOTE — Progress Notes (Signed)
? ? ?@Patient  ID: Lajuana Ripple, male    DOB: 1991-01-29, 31 y.o.   MRN: [id number] ? ?No chief complaint on file. ? ? ?Referring provider: ?Lyndal Pulley, DO ? ?HPI: ?31 year old male, never smoked. PMH significant for ADD, OCD, daytime sleepiness.  ? ?05/04/2021 ?Patient presents today for sleep consult. He had sleep study in 2016 that showed mild OSA, AHI was 5.4. He was never started on treatment. He has symptoms of loud snoring, witness apnea, restless sleep and daytime sleepiness. He has a family hx sleep apnea. Wife states that his oxygen will drop into the 60s. Typical bedtime is between 8-11pm. He takes 50mg  trazodone for insomnia at bedtime, he has prn Ambien which he uses very infrequently. He wakes up multiple times at night, previously thought this was due to his chronic back pain. He starts his day at 4:30 or 6am. No morning grogginess but reports daytime sleepiness in the afternoon. Drinking a lot of caffeine to stay awake. He takes adderall 15mg  as needed mostly when having to drive, he does not use every day. He has been having palpitations and dizziness. Denies narcolepsy, cataplexy or sleep walking.  ? ?Sleep questionnaire: ?Symptoms- Loud snoring, waking up gasping for air, daytime sleepiness  ?Prior sleep study- Yes, early 24s (results available) ?Bedtime- 8-11pm ?Time to fall asleep- minutes to hours  ?Nocturnal awakenings- too many times  ?Out of bed/start of day- 4:30 or 6am  ?Weight changes- Fluctuates  ?Do you operate heavy machinery- No ?Do you currently wear CPAP-No  ?Do you current wear oxygen- No  ?Epworth - 17 ? ?No Known Allergies ? ?Immunization History  ?Administered Date(s) Administered  ? Influenza,inj,Quad PF,6+ Mos 01/17/2016, 10/13/2016  ? Meningococcal Polysaccharide 02/13/2009  ? PFIZER(Purple Top)SARS-COV-2 Vaccination 04/15/2019, 05/16/2019  ? Tdap 02/13/2009, 12/21/2017  ? ? ?Past Medical History:  ?Diagnosis Date  ? ADD (attention deficit disorder)   ? Anxiety   ?  Narcolepsy through school  ? ? ?Tobacco History: ?Social History  ? ?Tobacco Use  ?Smoking Status Never  ?Smokeless Tobacco Never  ? ?Counseling given: Not Answered ? ? ?Outpatient Medications Prior to Visit  ?Medication Sig Dispense Refill  ? albuterol (PROVENTIL HFA;VENTOLIN HFA) 108 (90 Base) MCG/ACT inhaler Inhale 2 puffs into the lungs every 4 (four) hours as needed for wheezing or shortness of breath (cough, shortness of breath or wheezing.). 1 Inhaler 1  ? amphetamine-dextroamphetamine (ADDERALL) 15 MG tablet PLEASE SEE ATTACHED FOR DETAILED DIRECTIONS    ? benzonatate (TESSALON) 200 MG capsule Take 1 capsule (200 mg total) by mouth 3 (three) times daily as needed for cough. 45 capsule 1  ? diclofenac (VOLTAREN) 75 MG EC tablet Take 1 tablet (75 mg total) by mouth 2 (two) times daily. 30 tablet 0  ? DULoxetine (CYMBALTA) 20 MG capsule TAKE 2 CAPSULES BY MOUTH EVERY DAY 60 capsule 0  ? DULoxetine (CYMBALTA) 60 MG capsule Take 60 mg by mouth daily.    ? DULoxetine (CYMBALTA) 60 MG capsule TAKE 1 CAPSULE BY MOUTH EVERY DAY 90 capsule 1  ? levothyroxine (SYNTHROID) 25 MCG tablet TAKE 1 TABLET BY MOUTH EVERY DAY 30 tablet 1  ? methocarbamol (ROBAXIN) 500 MG tablet Take 500 mg by mouth 4 (four) times daily.    ? nortriptyline (PAMELOR) 10 MG capsule TAKE 1 CAPSULE (10 MG TOTAL) BY MOUTH AT BEDTIME. 90 capsule 0  ? predniSONE (DELTASONE) 20 MG tablet Take 1 tablet (20 mg total) by mouth daily with breakfast. 5 tablet 0  ?  tiZANidine (ZANAFLEX) 4 MG tablet TAKE 1 TABLET BY MOUTH EVERY DAY IN THE EVENING 30 tablet 1  ? traZODone (DESYREL) 50 MG tablet TAKE 0.5-1 TABLETS BY MOUTH AT BEDTIME AS NEEDED FOR SLEEP. 90 tablet 2  ? Vitamin D, Ergocalciferol, (DRISDOL) 50000 units CAPS capsule Take 1 capsule (50,000 Units total) by mouth every 7 (seven) days. 12 capsule 3  ? zolpidem (AMBIEN) 10 MG tablet Take 1 tablet (10 mg total) by mouth at bedtime as needed. 30 tablet 0  ? ?No facility-administered medications prior to  visit.  ? ? ?Review of Systems ? ?Review of Systems  ?Constitutional:  Positive for fatigue.  ?Respiratory:  Positive for apnea. Negative for chest tightness, shortness of breath and wheezing.   ?Cardiovascular: Negative.   ?Psychiatric/Behavioral:  Positive for sleep disturbance.   ? ? ?Physical Exam ? ?BP 122/70   Pulse 89   Wt 265 lb (120.2 kg)   SpO2 98%   BMI 35.94 kg/m?  ?Physical Exam ?Constitutional:   ?   Appearance: Normal appearance.  ?HENT:  ?   Head: Normocephalic and atraumatic.  ?   Mouth/Throat:  ?   Mouth: Mucous membranes are moist.  ?   Pharynx: Oropharynx is clear.  ?Cardiovascular:  ?   Rate and Rhythm: Normal rate and regular rhythm.  ?Pulmonary:  ?   Effort: Pulmonary effort is normal.  ?   Breath sounds: Normal breath sounds.  ?Musculoskeletal:     ?   General: Normal range of motion.  ?Skin: ?   General: Skin is warm and dry.  ?Neurological:  ?   General: No focal deficit present.  ?   Mental Status: He is alert and oriented to person, place, and time. Mental status is at baseline.  ?Psychiatric:     ?   Mood and Affect: Mood normal.     ?   Behavior: Behavior normal.     ?   Thought Content: Thought content normal.     ?   Judgment: Judgment normal.  ?  ? ?Lab Results: ? ?CBC ?   ?Component Value Date/Time  ? WBC 8.6 05/11/2021 0819  ? RBC 4.64 05/11/2021 0819  ? HGB 15.2 05/11/2021 0819  ? HCT 42.8 05/11/2021 0819  ? PLT 168.0 05/11/2021 0819  ? MCV 92.4 05/11/2021 0819  ? MCH 31.6 04/24/2015 1148  ? MCHC 35.5 05/11/2021 0819  ? RDW 12.8 05/11/2021 0819  ? LYMPHSABS 2.3 05/11/2021 0819  ? MONOABS 0.7 05/11/2021 0819  ? EOSABS 0.3 05/11/2021 0819  ? BASOSABS 0.1 05/11/2021 [id number]  ? ? ?BMET ?   ?Component Value Date/Time  ? NA 141 05/11/2021 0819  ? NA 144 01/17/2016 0945  ? K 3.5 05/11/2021 0819  ? CL 103 05/11/2021 0819  ? CO2 29 05/11/2021 0819  ? GLUCOSE 99 05/11/2021 0819  ? BUN 14 05/11/2021 0819  ? BUN 25 (H) 01/17/2016 0945  ? CREATININE 1.18 05/11/2021 0819  ? CREATININE 0.92  04/24/2015 1148  ? CALCIUM 9.5 05/11/2021 0819  ? CALCIUM CANCELED 05/11/2021 0819  ? GFRNONAA 110 01/17/2016 0945  ? GFRAA 127 01/17/2016 0945  ? ? ?BNP ?No results found for: BNP ? ?ProBNP ?No results found for: PROBNP ? ?Imaging: ?NCV with EMG(electromyography) ? ?Result Date: 04/14/2021 ?Alda Berthold, DO     04/14/2021  4:08 PM [facility] [place], [place]  [place], [place] [address] Tel: [phone] Fax:  [phone] Test Date:  04/14/2021 Patient: Renold Suite DOB:  03-24-1990 Physician: Narda Amber, DO Sex: Male Height: 6' " Ref Phys: Hulan Saas, DO ID#: [id number]   Technician:  Patient Complaints: This is a 31 year old man referred for evaluation of bilateral arm pain and paresthesias. NCV & EMG Findings: Extensive electrodiagnostic testing of the right upper extremity and additional studies of the left shows: Bilateral median, ulnar, and mixed palmar sensory responses are within normal limits. Bilateral median and ulnar motor responses are within normal limits. There is no evidence of active or chronic motor axonal loss changes affecting any of the tested muscles.  Motor unit configuration and recruitment pattern is within normal limits. Impression: This is a normal study of the upper extremities.  In particular, there is no evidence of carpal tunnel syndrome or a cervical radiculopathy. ___________________________ Narda Amber, DO Nerve Conduction Studies Anti Sensory Summary Table  Stim Site NR Peak (ms) Norm Peak (ms) P-T Amp (?V) Norm P-T Amp Left Median Anti Sensory (2nd Digit)  37?C Wrist    2.9 <3.3 30.6 >20 Right Median Anti Sensory (2nd Digit)  37?C Wrist    2.9 <3.3 34.6 >20 Left Ulnar Anti Sensory (5th Digit)  37?C Wrist    2.7 <3.0 31.9 >18 Right Ulnar Anti Sensory (5th Digit)  37?C Wrist    2.5 <3.0 35.5 >18 Motor Summary Table  Stim Site NR Onset (ms) Norm Onset (ms) O-P Amp (mV) Norm O-P Amp Site1 Site2 Delta-0 (ms) Dist (cm) Vel (m/s) Norm Vel (m/s) Left Median Motor  (Abd Poll Brev)  37?C Wrist    3.0 <3.9 13.3 >6 Elbow Wrist 4.9 30.0 61 >51 Elbow    7.9  13.0        Right Median Motor (Abd Poll Brev)  37?C Wrist    3.0 <3.9 13.9 >6 Elbow Wrist 4.7 31.0 66 >51 Elbow    7.7

## 2021-05-04 NOTE — Patient Instructions (Signed)
Sleep apnea is defined as period of 10 seconds or longer when you stop breathing at night. This can happen multiple times a night. Dx sleep apnea is when this occurs more than 5 times an hour.  ?  ?Mild OSA 5-15 apneic events an hour ?Moderate OSA 15-30 apneic events an hour ?Severe OSA > 30 apneic events an hour ?  ?Untreated sleep apnea puts you at higher risk for cardiac arrhythmias, pulmonary HTN, stroke and diabetes ?  ?Treatment options include weight loss, side sleeping position, oral appliance, CPAP therapy or referral to ENT for possible surgical options  ?  ?Recommendations: ?Focus on side sleeping position or elevate head of bed  ?Work on weight loss efforts  ?Do not drive if experiencing excessive daytime sleepiness of fatigue  ?  ?Orders: ?Labs (cbc, bmet, tsh) ?Home sleep study re: snoring  ? ?Referral: ?Cardiology re: palpitations ? ?Follow-up: ?4-6 week visit to review sleep study results and discuss treatment options further ? ?

## 2021-05-04 NOTE — Progress Notes (Signed)
Please let patient know labs were normal (CBC/plt, BMET and TSH)

## 2021-05-06 DIAGNOSIS — M5136 Other intervertebral disc degeneration, lumbar region: Secondary | ICD-10-CM | POA: Diagnosis not present

## 2021-05-06 DIAGNOSIS — M5135 Other intervertebral disc degeneration, thoracolumbar region: Secondary | ICD-10-CM | POA: Diagnosis not present

## 2021-05-09 NOTE — Progress Notes (Signed)
?Terrilee Files D.O. ?[facility] ?[address] [place] [address] ?Phone: [phone] ?Subjective:   ?I, [name], am serving as a scribe for Dr. Antoine Primas. ? ?This visit occurred during the SARS-CoV-2 public health emergency.  Safety protocols were in place, including screening questions prior to the visit, additional usage of staff PPE, and extensive cleaning of exam room while observing appropriate contact time as indicated for disinfecting solutions.  ? ?I'm seeing this patient by the request  of:  Porfirio Oar, PA ? ?CC: Back and neck pain, new onset headaches ? ?[id number]  ?Frank [name] is a 31 y.o. male coming in with complaint of back and neck pain. OMT 04/30/2021. Patient states that he has been doing ok. No change since last visit.  Only thing new is more headaches.  He has had a couple times where headaches have been severe.  Check blood pressure and systolics in the 170s over 110s.  Patient states that heart rate is 100 when this occurs as well.  Patient has had difficulty to work with a physical therapist to try to get it down when this is occurred.  Which time it seems to get a little bit better.  Patient still staying off of pain medications.  Patient does state that about a couple times has had difficulties with radiation down the arms. ? ?Medications patient has been prescribed: Synthroid, Cymbalta ? ?Taking: ? ? ?  ? ? ? ? ?Reviewed prior external information including notes and imaging from previsou exam, outside providers and external EMR if available.  ? ?As well as notes that were available from care everywhere and other healthcare systems. ? ?Past medical history, social, surgical and family history all reviewed in electronic medical record.  No pertanent information unless stated regarding to the chief complaint.  ? ?Past Medical History:  ?Diagnosis Date  ? ADD (attention deficit disorder)   ? Anxiety   ? Narcolepsy through school  ?  ?No Known  Allergies ? ? ?Review of Systems: ? No headache, visual changes, nausea, vomiting, diarrhea, constipation, dizziness, abdominal pain, skin rash, fevers, chills, night sweats, weight loss, swollen lymph nodes, , chest pain, shortness of breath, mood changes. POSITIVE muscle aches, body aches, joint pain ? ?Objective  ?Blood pressure 128/88, pulse 72, height 6' (1.829 m), weight 266 lb (120.7 kg), SpO2 98 %. ?  ?General: No apparent distress alert and oriented x3 mood and affect normal, dressed appropriately.  ?HEENT: Pupils equal, extraocular movements intact  ?Respiratory: Patient's speak in full sentences and does not appear short of breath  ?Cardiovascular: No lower extremity edema, non tender, no erythema  ?Neck exam does have significant stiffness noted.  Lacks the last 5 degrees of extension in the last 10 degrees of flexion.  Limited sidebending bilaterally. ? ?Osteopathic findings ?Avoided the neck secondary to patient's recent headaches. ?T3 extended rotated and side bent right inhaled rib ?T9 extended rotated and side bent left ?L2 flexed rotated and side bent right ?Sacrum right on right ? ? ? ? ?  ?Assessment and Plan: ? ?New onset headache ?New onset at this point.  Seems to be worsening.  Patient has stopped his narcotics which could be secondary to more of a breakthrough.  Patient does have this excessive hypertension that occurs from time to time with a systolics in the 170s is but the heart rate is tachycardic with this happens.  Could be secondary to her pain response.  Due to the severity of his headaches  that has had him call out of work previously and recent work-up that is concerning for more of the sleep apnea and still awaiting a new sleep study I do feel MRI of the brain is not necessary.  This could also help [place] if there is anything else such as increased intracranial pressures that could be potentially contributing to some of his chronic pains as well.  We will check other laboratory  work-up that can go with this as well.  Follow-up with me again 6 to 8 weeks  ? ?Nonallopathic problems ? ?Decision today to treat with OMT was based on Physical Exam ? ?After verbal consent patient was treated with HVLA, ME, FPR techniques in  rib, thoracic, lumbar, and sacral  areas avoided the neck secondary to the new onset of headaches. ? ?Patient tolerated the procedure well with improvement in symptoms ? ?Patient given exercises, stretches and lifestyle modifications ? ?See medications in patient instructions if given ? ?Patient will follow up in 4-8 weeks ? ?  ? ?The above documentation has been reviewed and is accurate and complete [name], DO ? ? ? ?  ? ? Note: This dictation was prepared with Dragon dictation along with smaller phrase technology. Any transcriptional errors that result from this process are unintentional.    ?  ?  ? ?

## 2021-05-10 DIAGNOSIS — M5135 Other intervertebral disc degeneration, thoracolumbar region: Secondary | ICD-10-CM | POA: Diagnosis not present

## 2021-05-10 DIAGNOSIS — M5136 Other intervertebral disc degeneration, lumbar region: Secondary | ICD-10-CM | POA: Diagnosis not present

## 2021-05-11 ENCOUNTER — Ambulatory Visit: Payer: BC Managed Care – PPO | Admitting: Family Medicine

## 2021-05-11 VITALS — BP 128/88 | HR 72 | Ht 72.0 in | Wt 266.0 lb

## 2021-05-11 DIAGNOSIS — M9904 Segmental and somatic dysfunction of sacral region: Secondary | ICD-10-CM

## 2021-05-11 DIAGNOSIS — M9903 Segmental and somatic dysfunction of lumbar region: Secondary | ICD-10-CM | POA: Diagnosis not present

## 2021-05-11 DIAGNOSIS — M9902 Segmental and somatic dysfunction of thoracic region: Secondary | ICD-10-CM | POA: Diagnosis not present

## 2021-05-11 DIAGNOSIS — M2550 Pain in unspecified joint: Secondary | ICD-10-CM | POA: Diagnosis not present

## 2021-05-11 DIAGNOSIS — R519 Headache, unspecified: Secondary | ICD-10-CM

## 2021-05-11 DIAGNOSIS — G8929 Other chronic pain: Secondary | ICD-10-CM

## 2021-05-11 DIAGNOSIS — M9908 Segmental and somatic dysfunction of rib cage: Secondary | ICD-10-CM

## 2021-05-11 LAB — COMPREHENSIVE METABOLIC PANEL
ALT: 31 U/L (ref 0–53)
AST: 27 U/L (ref 0–37)
Albumin: 4.4 g/dL (ref 3.5–5.2)
Alkaline Phosphatase: 47 U/L (ref 39–117)
BUN: 14 mg/dL (ref 6–23)
CO2: 29 mEq/L (ref 19–32)
Calcium: 9.5 mg/dL (ref 8.4–10.5)
Chloride: 103 mEq/L (ref 96–112)
Creatinine, Ser: 1.18 mg/dL (ref 0.40–1.50)
GFR: 83.06 mL/min (ref >60.00)
Glucose, Bld: 99 mg/dL (ref 70–99)
Potassium: 3.5 mEq/L (ref 3.5–5.1)
Sodium: 141 mEq/L (ref 135–145)
Total Bilirubin: 0.8 mg/dL (ref 0.2–1.2)
Total Protein: 6.9 g/dL (ref 6.0–8.3)

## 2021-05-11 LAB — CBC WITH DIFFERENTIAL/PLATELET
Basophils Absolute: 0.1 10*3/uL (ref 0.0–0.1)
Basophils Relative: 0.7 % (ref 0.0–3.0)
Eosinophils Absolute: 0.3 10*3/uL (ref 0.0–0.7)
Eosinophils Relative: 3.1 % (ref 0.0–5.0)
HCT: 42.8 % (ref 39.0–52.0)
Hemoglobin: 15.2 g/dL (ref 13.0–17.0)
Lymphocytes Relative: 26.7 % (ref 12.0–46.0)
Lymphs Abs: 2.3 10*3/uL (ref 0.7–4.0)
MCHC: 35.5 g/dL (ref 30.0–36.0)
MCV: 92.4 fl (ref 78.0–100.0)
Monocytes Absolute: 0.7 10*3/uL (ref 0.1–1.0)
Monocytes Relative: 8.6 % (ref 3.0–12.0)
Neutro Abs: 5.3 10*3/uL (ref 1.4–7.7)
Neutrophils Relative %: 60.9 % (ref 43.0–77.0)
Platelets: 168 10*3/uL (ref 150.0–400.0)
RBC: 4.64 Mil/uL (ref 4.22–5.81)
RDW: 12.8 % (ref 11.5–15.5)
WBC: 8.6 10*3/uL (ref 4.0–10.5)

## 2021-05-11 LAB — LIPID PANEL
Cholesterol: 161 mg/dL (ref 0–200)
HDL: 47.7 mg/dL (ref >39.00)
LDL Cholesterol: 87 mg/dL (ref 0–99)
NonHDL: 113.61
Total CHOL/HDL Ratio: 3
Triglycerides: 132 mg/dL (ref 0.0–149.0)
VLDL: 26.4 mg/dL (ref 0.0–40.0)

## 2021-05-11 LAB — TSH: TSH: 2.99 u[IU]/mL (ref 0.35–5.50)

## 2021-05-11 LAB — VITAMIN D 25 HYDROXY (VIT D DEFICIENCY, FRACTURES): VITD: 77.63 ng/mL (ref 30.00–100.00)

## 2021-05-11 LAB — URIC ACID: Uric Acid, Serum: 7.2 mg/dL (ref 4.0–7.8)

## 2021-05-11 NOTE — Patient Instructions (Addendum)
MRI brain [phone] ?Labs today ? ? ?

## 2021-05-11 NOTE — Assessment & Plan Note (Signed)
New onset at this point.  Seems to be worsening.  Patient has stopped his narcotics which could be secondary to more of a breakthrough.  Patient does have this excessive hypertension that occurs from time to time with a systolics in the 170s is but the heart rate is tachycardic with this happens.  Could be secondary to her pain response.  Due to the severity of his headaches that has had him call out of work previously and recent work-up that is concerning for more of the sleep apnea and still awaiting a new sleep study I do feel MRI of the brain is not necessary.  This could also help [place] if there is anything else such as increased intracranial pressures that could be potentially contributing to some of his chronic pains as well.  We will check other laboratory work-up that can go with this as well.  Follow-up with me again 6 to 8 weeks ?

## 2021-05-12 DIAGNOSIS — M5136 Other intervertebral disc degeneration, lumbar region: Secondary | ICD-10-CM | POA: Diagnosis not present

## 2021-05-12 DIAGNOSIS — M5135 Other intervertebral disc degeneration, thoracolumbar region: Secondary | ICD-10-CM | POA: Diagnosis not present

## 2021-05-13 DIAGNOSIS — R002 Palpitations: Secondary | ICD-10-CM | POA: Insufficient documentation

## 2021-05-13 LAB — PTH, INTACT AND CALCIUM: PTH: 41 pg/mL (ref 16–77)

## 2021-05-13 NOTE — Assessment & Plan Note (Signed)
-   Checking basic labs (cbc, bmet, tsh) and referring to cardiology  ?

## 2021-05-13 NOTE — Assessment & Plan Note (Signed)
Patient has symptoms of snoring, witness apnea, restless sleep and daytime sleepiness. Epworth 17. Concern patient could have sleep apnea, needs HST to evaluate. Reviewed risk of untreated sleep apnea including cardiac arrhythmias, stroke, pulm HTN, DM. We also briefly reviewed treatment options. Recommend side sleeping position. Advised against driving if experiencing excessive daytime sleepiness. FU in 4-6 weeks to review sleep study results and treatment options if needed.  ?

## 2021-05-17 ENCOUNTER — Other Ambulatory Visit (HOSPITAL_COMMUNITY): Payer: Self-pay

## 2021-05-17 DIAGNOSIS — M5136 Other intervertebral disc degeneration, lumbar region: Secondary | ICD-10-CM | POA: Diagnosis not present

## 2021-05-17 DIAGNOSIS — M5135 Other intervertebral disc degeneration, thoracolumbar region: Secondary | ICD-10-CM | POA: Diagnosis not present

## 2021-05-18 ENCOUNTER — Other Ambulatory Visit (HOSPITAL_COMMUNITY): Payer: Self-pay

## 2021-05-18 MED ORDER — AMPHETAMINE-DEXTROAMPHETAMINE 15 MG PO TABS
ORAL_TABLET | ORAL | 0 refills | Status: DC
  Filled 2021-05-18: qty 75, 30d supply, fill #0

## 2021-05-19 DIAGNOSIS — M5136 Other intervertebral disc degeneration, lumbar region: Secondary | ICD-10-CM | POA: Diagnosis not present

## 2021-05-19 DIAGNOSIS — M5135 Other intervertebral disc degeneration, thoracolumbar region: Secondary | ICD-10-CM | POA: Diagnosis not present

## 2021-05-20 ENCOUNTER — Other Ambulatory Visit: Payer: Self-pay | Admitting: Family Medicine

## 2021-05-24 DIAGNOSIS — M5135 Other intervertebral disc degeneration, thoracolumbar region: Secondary | ICD-10-CM | POA: Diagnosis not present

## 2021-05-24 DIAGNOSIS — M5136 Other intervertebral disc degeneration, lumbar region: Secondary | ICD-10-CM | POA: Diagnosis not present

## 2021-05-25 ENCOUNTER — Telehealth: Payer: Self-pay | Admitting: Primary Care

## 2021-05-25 DIAGNOSIS — R0683 Snoring: Secondary | ICD-10-CM

## 2021-05-25 NOTE — Telephone Encounter (Signed)
Pt calling, very upset he was told by BW that HST would be scheduled 4-6 weeks. When Jinny Blossom wrote him on mychart, she said 8-10 weeks out. I did explain we are 8-10 weeks out at this time.Pt is very upset and would like to know where he is in the process. Please advise [phone] ?

## 2021-05-25 NOTE — Telephone Encounter (Signed)
Order has been placed. Routing back to [place] as [place]. ?

## 2021-05-25 NOTE — Telephone Encounter (Signed)
HST has not been ordered.  °

## 2021-05-26 DIAGNOSIS — M5135 Other intervertebral disc degeneration, thoracolumbar region: Secondary | ICD-10-CM | POA: Diagnosis not present

## 2021-05-26 DIAGNOSIS — M5136 Other intervertebral disc degeneration, lumbar region: Secondary | ICD-10-CM | POA: Diagnosis not present

## 2021-05-26 NOTE — Telephone Encounter (Signed)
Auth has been obtained & given to [facility] to schedule.  ?

## 2021-05-28 ENCOUNTER — Ambulatory Visit
Admission: RE | Admit: 2021-05-28 | Discharge: 2021-05-28 | Disposition: A | Discharge status: Home / Self Care | Payer: BC Managed Care – PPO | Source: Ambulatory Visit | Attending: Family Medicine | Admitting: Family Medicine

## 2021-05-28 DIAGNOSIS — R519 Headache, unspecified: Secondary | ICD-10-CM

## 2021-05-30 NOTE — Progress Notes (Signed)
?Terrilee Files D.O. ?[facility] ?[address] [place] [address] ?Phone: [phone] ?Subjective:   ?I, [name], am serving as a scribe for Dr. Antoine Primas. ? ?This visit occurred during the SARS-CoV-2 public health emergency.  Safety protocols were in place, including screening questions prior to the visit, additional usage of staff PPE, and extensive cleaning of exam room while observing appropriate contact time as indicated for disinfecting solutions.  ?I'm seeing this patient by the request  of:  Porfirio Oar, PA ? ?CC: back and neck pain follow up  ? ?EVO:[id number]  ?Frank [name] is a 31 y.o. male coming in with complaint of back and neck pain. OMT 05/11/2021. Patient states that he is sore. Leg pain is improving but does feel like another epidural would be helpful. Also c/o neck pain for past 2 days.  ? ?Medications patient has been prescribed: Synthroid, Cymbalta ? ?Taking: Yes ? ? ?  ? ? ? ? ?Reviewed prior external information including notes and imaging from previsou exam, outside providers and external EMR if available.  ? ?As well as notes that were available from care everywhere and other healthcare systems. ? ?Past medical history, social, surgical and family history all reviewed in electronic medical record.  No pertanent information unless stated regarding to the chief complaint.  ? ?Past Medical History:  ?Diagnosis Date  ? ADD (attention deficit disorder)   ? Anxiety   ? Narcolepsy through school  ?  ?No Known Allergies ? ? ?Review of Systems: ? No headache, visual changes, nausea, vomiting, diarrhea, constipation, dizziness, abdominal pain, skin rash, fevers, chills, night sweats, weight loss, swollen lymph nodes,  joint swelling, chest pain, shortness of breath, mood changes. POSITIVE muscle aches, body aches ? ?Objective  ?Blood pressure 110/88, pulse 90, height 6' (1.829 m), weight 273 lb (123.8 kg). ?  ?General: No apparent distress alert and oriented x3 mood  and affect normal, dressed appropriately.  ?HEENT: Pupils equal, extraocular movements intact  ?Respiratory: Patient's speak in full sentences and does not appear short of breath  ?Cardiovascular: No lower extremity edema, non tender, no erythema  ?Low back exam does have some loss of lordosis.  Still has tenderness to palpation diffusely from the cervical spine to the lumbar spine axial.  Does have some radicular symptoms noted in the left lower extremity.  Tightness noted with FABER left greater than right. ? ?Osteopathic findings ? ?C2 flexed rotated and side bent right ?C6 flexed rotated and side bent left ?T3 extended rotated and side bent left inhaled rib ?T9 extended rotated and side bent left ?L2 flexed rotated and side bent right ?Sacrum left on left ? ? ? ? ?  ?Assessment and Plan: ? ?Acute left lumbar radiculopathy ?Patient is still having intermittent radicular symptoms.  Did have a calf injury on the left side that could have potentially contributed as well or possibly the lumbar radiculopathy.  Patient would like to have another epidural which patient has responded to previously.  Discussed again about patient's age patient is looking into other possible things that are potentially contributing and does have a sleep study coming up in the near future.  Hopefully this will be beneficial as well.  Follow-up with me again in 6 to 8 weeks otherwise. ? ?Scheuermann's disease ?Chronic problem with mild exacerbation.  Continues to respond fairly well to osteopathic manipulation but patient needs to continue to work on the range of motion and core strengthening.  Being scheduled for another epidural.  Continue the duloxetine.  Given Toradol and Depo-Medrol given today secondary to severity of tightness.  Follow-up again in 6 to 8 weeks ?  ? ?Nonallopathic problems ? ?Decision today to treat with OMT was based on Physical Exam ? ?After verbal consent patient was treated with HVLA, ME, FPR techniques in  cervical, rib, thoracic, lumbar, and sacral  areas ? ?Patient tolerated the procedure well with improvement in symptoms ? ?Patient given exercises, stretches and lifestyle modifications ? ?See medications in patient instructions if given ? ?Patient will follow up in 4-8 weeks ? ?  ? ?The above documentation has been reviewed and is accurate and complete [name], DO ? ? ? ?  ? ? Note: This dictation was prepared with Dragon dictation along with smaller phrase technology. Any transcriptional errors that result from this process are unintentional.    ?  ?  ? ?

## 2021-06-01 ENCOUNTER — Ambulatory Visit: Payer: BC Managed Care – PPO | Admitting: Family Medicine

## 2021-06-01 VITALS — BP 110/88 | HR 90 | Ht 72.0 in | Wt 273.0 lb

## 2021-06-01 DIAGNOSIS — M4200 Juvenile osteochondrosis of spine, site unspecified: Secondary | ICD-10-CM

## 2021-06-01 DIAGNOSIS — M9904 Segmental and somatic dysfunction of sacral region: Secondary | ICD-10-CM

## 2021-06-01 DIAGNOSIS — M9903 Segmental and somatic dysfunction of lumbar region: Secondary | ICD-10-CM

## 2021-06-01 DIAGNOSIS — M5416 Radiculopathy, lumbar region: Secondary | ICD-10-CM

## 2021-06-01 DIAGNOSIS — M9908 Segmental and somatic dysfunction of rib cage: Secondary | ICD-10-CM

## 2021-06-01 DIAGNOSIS — M9902 Segmental and somatic dysfunction of thoracic region: Secondary | ICD-10-CM

## 2021-06-01 DIAGNOSIS — M9901 Segmental and somatic dysfunction of cervical region: Secondary | ICD-10-CM

## 2021-06-01 MED ORDER — METHYLPREDNISOLONE ACETATE 80 MG/ML IJ SUSP
80.0000 mg | Freq: Once | INTRAMUSCULAR | Status: AC
  Administered 2021-06-01: 80 mg via INTRAMUSCULAR

## 2021-06-01 MED ORDER — KETOROLAC TROMETHAMINE 60 MG/2ML IM SOLN
60.0000 mg | Freq: Once | INTRAMUSCULAR | Status: AC
  Administered 2021-06-01: 60 mg via INTRAMUSCULAR

## 2021-06-01 NOTE — Assessment & Plan Note (Signed)
Chronic problem with mild exacerbation.  Continues to respond fairly well to osteopathic manipulation but patient needs to continue to work on the range of motion and core strengthening.  Being scheduled for another epidural.  Continue the duloxetine.  Given Toradol and Depo-Medrol given today secondary to severity of tightness.  Follow-up again in 6 to 8 weeks ?

## 2021-06-01 NOTE — Patient Instructions (Signed)
Good to see you  ?Injections given today ?Epidural ordered ?See me as scheduled  ?

## 2021-06-01 NOTE — Assessment & Plan Note (Signed)
Patient is still having intermittent radicular symptoms.  Did have a calf injury on the left side that could have potentially contributed as well or possibly the lumbar radiculopathy.  Patient would like to have another epidural which patient has responded to previously.  Discussed again about patient's age patient is looking into other possible things that are potentially contributing and does have a sleep study coming up in the near future.  Hopefully this will be beneficial as well.  Follow-up with me again in 6 to 8 weeks otherwise. ?

## 2021-06-02 ENCOUNTER — Ambulatory Visit: Payer: BC Managed Care – PPO

## 2021-06-02 DIAGNOSIS — G4733 Obstructive sleep apnea (adult) (pediatric): Secondary | ICD-10-CM

## 2021-06-02 DIAGNOSIS — R0683 Snoring: Secondary | ICD-10-CM

## 2021-06-03 DIAGNOSIS — M5136 Other intervertebral disc degeneration, lumbar region: Secondary | ICD-10-CM | POA: Diagnosis not present

## 2021-06-03 DIAGNOSIS — M5135 Other intervertebral disc degeneration, thoracolumbar region: Secondary | ICD-10-CM | POA: Diagnosis not present

## 2021-06-06 ENCOUNTER — Telehealth: Payer: Self-pay | Admitting: Family Medicine

## 2021-06-06 NOTE — Telephone Encounter (Signed)
Patient states [facility] called and cancelled his upcoming epidural as he is over his limit and cannot have another one until after 07/10/2021. ? ?Frank [name] does not know the dates of his previous epidurals but states that Dr. Katrinka Blazing has been tracking and Frank [name] believes that [facility] is mistaken. ?

## 2021-06-07 ENCOUNTER — Ambulatory Visit
Admission: RE | Admit: 2021-06-07 | Discharge: 2021-06-07 | Disposition: A | Discharge status: Home / Self Care | Payer: BC Managed Care – PPO | Source: Ambulatory Visit | Attending: Family Medicine | Admitting: Family Medicine

## 2021-06-07 DIAGNOSIS — M5416 Radiculopathy, lumbar region: Secondary | ICD-10-CM

## 2021-06-07 DIAGNOSIS — M4200 Juvenile osteochondrosis of spine, site unspecified: Secondary | ICD-10-CM

## 2021-06-07 MED ORDER — METHYLPREDNISOLONE ACETATE 40 MG/ML INJ SUSP (RADIOLOG
80.0000 mg | Freq: Once | INTRAMUSCULAR | Status: AC
  Administered 2021-06-07: 80 mg via EPIDURAL

## 2021-06-07 MED ORDER — IOPAMIDOL (ISOVUE-M 200) INJECTION 41%
1.0000 mL | Freq: Once | INTRAMUSCULAR | Status: AC
  Administered 2021-06-07: 1 mL via EPIDURAL

## 2021-06-07 NOTE — Discharge Instructions (Signed)

## 2021-06-08 NOTE — Telephone Encounter (Signed)
Injection done 06/07/2021. ?

## 2021-06-09 DIAGNOSIS — M5136 Other intervertebral disc degeneration, lumbar region: Secondary | ICD-10-CM | POA: Diagnosis not present

## 2021-06-09 DIAGNOSIS — M5135 Other intervertebral disc degeneration, thoracolumbar region: Secondary | ICD-10-CM | POA: Diagnosis not present

## 2021-06-13 ENCOUNTER — Encounter: Payer: Self-pay | Admitting: Cardiovascular Disease

## 2021-06-13 ENCOUNTER — Ambulatory Visit: Payer: BC Managed Care – PPO | Admitting: Cardiovascular Disease

## 2021-06-13 VITALS — BP 152/100 | HR 71 | Ht 72.0 in | Wt 275.0 lb

## 2021-06-13 DIAGNOSIS — G4733 Obstructive sleep apnea (adult) (pediatric): Secondary | ICD-10-CM | POA: Diagnosis not present

## 2021-06-13 DIAGNOSIS — I10 Essential (primary) hypertension: Secondary | ICD-10-CM

## 2021-06-13 MED ORDER — POTASSIUM CHLORIDE ER 10 MEQ PO TBCR: 10.0000 meq | EXTENDED_RELEASE_TABLET | Freq: Two times a day (BID) | ORAL | 3 refills | Status: DC

## 2021-06-13 MED ORDER — LOSARTAN POTASSIUM 50 MG PO TABS: 50.0000 mg | ORAL_TABLET | Freq: Every day | ORAL | 3 refills | Status: DC

## 2021-06-13 MED ORDER — HYDROCHLOROTHIAZIDE 25 MG PO TABS
25.0000 mg | ORAL_TABLET | Freq: Every day | ORAL | 3 refills | Status: DC
Start: 2021-06-13 — End: 2022-05-12

## 2021-06-13 NOTE — Progress Notes (Signed)
?Cardiology Office Note:   ? ?Date:  06/13/2021  ? ?ID:  Frank [name], DOB 03-01-1990, MRN [id number] ? ?PCP:  Harrison Mons, PA ?  ?[facility] Providers ?Cardiologist:  Omauri Boeve  ?  ? ?Referring MD: Martyn Ehrich, NP  ? ?Chief Complaint  ?Patient presents with  ? Hypertension  ?   ?  ? Sleep Apnea  ?  ? ?History of Present Illness:   ? ?Frank [name] is a 31 y.o. male with a hx of ADD , obesity. New diagnosis of OSA. ? ?Was just diagnosed with OSA  ?Is seein [employer], PA ( [facility] )  ? ?His BP and HR has been elevated for the past month. ?180 /100  ?No CP ,  no dyspnea .   ?Has not been working out since he had a herniated disc ( L4)  ?Steroid epidural injections seem to help .  ? ?No HR irregularities,  just a "faster than normal HR " ? ?Tries to avoid salt for the most part  ? ? ? ? ? ?Past Medical History:  ?Diagnosis Date  ? ADD (attention deficit disorder)   ? Anxiety   ? Narcolepsy through school  ? ? ?Past Surgical History:  ?Procedure Laterality Date  ? KNEE SURGERY    ? [place] EXTRACTION  2013  ? ? ?Current Medications: ?Current Meds  ?Medication Sig  ? albuterol (PROVENTIL HFA;VENTOLIN HFA) 108 (90 Base) MCG/ACT inhaler Inhale 2 puffs into the lungs every 4 (four) hours as needed for wheezing or shortness of breath (cough, shortness of breath or wheezing.).  ? amphetamine-dextroamphetamine (ADDERALL) 15 MG tablet Take 1 tablet by mouth 2 times daily. May take additional 1/2 tablet daily, as needed for longer days.  ? Azelastine HCl 137 MCG/SPRAY SOLN Place 2 sprays into both nostrils 2 (two) times daily.  ? benzonatate (TESSALON) 200 MG capsule Take 1 capsule (200 mg total) by mouth 3 (three) times daily as needed for cough.  ? buPROPion (WELLBUTRIN XL) 300 MG 24 hr tablet Take 300 mg by mouth daily.  ? Cholecalciferol (VITAMIN D3) 1.25 MG (50000 UT) CAPS Take 1 capsule by mouth once a week.  ? CLOMID 50 MG tablet Take 50 mg by mouth daily.  ? cyclobenzaprine (FLEXERIL) 5 MG  tablet Take 5-10 mg by mouth at bedtime as needed.  ? DULoxetine (CYMBALTA) 60 MG capsule TAKE 1 CAPSULE BY MOUTH EVERY DAY  ? hydrochlorothiazide (HYDRODIURIL) 25 MG tablet Take 1 tablet (25 mg total) by mouth daily.  ? levothyroxine (SYNTHROID) 25 MCG tablet TAKE 1 TABLET BY MOUTH EVERY DAY  ? losartan (COZAAR) 50 MG tablet Take 1 tablet (50 mg total) by mouth daily.  ? methocarbamol (ROBAXIN) 500 MG tablet Take 500 mg by mouth 4 (four) times daily.  ? multivitamin (ONE-A-DAY MEN'S) TABS tablet Take 1 tablet by mouth daily.  ? oxyCODONE-acetaminophen (PERCOCET) 10-325 MG tablet Take 1 tablet by mouth every 8 (eight) hours as needed. Take 1 tablet by mouth every 8 (eight) hours as needed.  ? potassium chloride (KLOR-CON) 10 MEQ tablet Take 1 tablet (10 mEq total) by mouth 2 (two) times daily.  ? predniSONE (DELTASONE) 20 MG tablet Take 1 tablet (20 mg total) by mouth daily with breakfast.  ? traZODone (DESYREL) 50 MG tablet TAKE 0.5-1 TABLETS BY MOUTH AT BEDTIME AS NEEDED FOR SLEEP.  ? zolpidem (AMBIEN) 10 MG tablet Take 1 tablet (10 mg total) by mouth at bedtime as needed.  ?  ? ?  Allergies:   Patient has no known allergies.  ? ?Social History  ? ?Socioeconomic History  ? Marital status: Single  ?  Spouse name: Not on file  ? Number of children: 0  ? Years of education: Not on file  ? Highest education level: Bachelor's degree (e.g., BA, AB, BS)  ?Occupational History  ?  Employer: [employer]  ?Tobacco Use  ? Smoking status: Never  ? Smokeless tobacco: Never  ?Substance and Sexual Activity  ? Alcohol use: Yes  ?  Alcohol/week: 12.0 standard drinks  ?  Types: 5 Shots of liquor, 7 Standard drinks or equivalent per week  ?  Comment: Mostly just weekends  ? Drug use: No  ? Sexual activity: Yes  ?  Birth control/protection: Condom  ?Other Topics Concern  ? Not on file  ?Social History Narrative  ? Caffeine 4-5 cups a day. Decreasing adderall has increased need for coffee.  ? Moved back in with his parents from [place], [place]  in 02/2017 to address his chronic pain and disability more aggressively (leave of absence from work).  ? ?Social Determinants of Health  ? ?Financial Resource Strain: Not on file  ?Food Insecurity: Not on file  ?Transportation Needs: Not on file  ?Physical Activity: Not on file  ?Stress: Not on file  ?Social Connections: Not on file  ?  ? ?Family History: ?The patient's family history includes Cancer in his paternal grandmother; Diabetes in his paternal grandfather; Hypertension in his paternal grandmother; Prostate cancer in his father; Stroke in his maternal grandmother. There is no history of Other. ? ?ROS:   ?Please see the history of present illness.    ? All other systems reviewed and are negative. ? ?EKGs/Labs/Other Studies Reviewed:   ? ?The following studies were reviewed today: ? ? ?EKG: Jun 13, 2021: Normal sinus rhythm at 71.  No ST or T wave changes. ? ?Recent Labs: ?05/11/2021: ALT 31; BUN 14; Creatinine, Ser 1.18; Hemoglobin 15.2; Platelets 168.0; Potassium 3.5; Sodium 141; TSH 2.99  ?Recent Lipid Panel ?   ?Component Value Date/Time  ? CHOL 161 05/11/2021 0819  ? TRIG 132.0 05/11/2021 0819  ? HDL 47.70 05/11/2021 0819  ? CHOLHDL 3 05/11/2021 0819  ? VLDL 26.4 05/11/2021 0819  ? [place] 87 05/11/2021 0819  ? ? ? ?Risk Assessment/Calculations:   ?  ? ?    ? ?Physical Exam:   ? ?VS:  BP (!) 152/100   Pulse 71   Ht 6' (1.829 m)   Wt 275 lb (124.7 kg)   SpO2 97%   BMI 37.30 kg/m?    ? ?Wt Readings from Last 3 Encounters:  ?06/13/21 275 lb (124.7 kg)  ?06/01/21 273 lb (123.8 kg)  ?05/11/21 266 lb (120.7 kg)  ?  ? ?GEN:  moderately obese, very pleasant young man , well developed in no acute distress ?HEENT: Normal ?NECK: No JVD; No carotid bruits ?LYMPHATICS: No lymphadenopathy ?CARDIAC: RRR, no murmurs, rubs, gallops ?RESPIRATORY:  Clear to auscultation without rales, wheezing or rhonchi  ?ABDOMEN: Soft, non-tender, non-distended ?MUSCULOSKELETAL:  No edema; No deformity  ?SKIN: Warm and dry ?NEUROLOGIC:   Alert and oriented x 3 ?PSYCHIATRIC:  Normal affect  ? ?ASSESSMENT:   ? ?1. Hypertension, unspecified type   ?2. OSA (obstructive sleep apnea)   ? ?PLAN:   ? ?In order of problems listed above: ? ?Hypertension: [name] presents for further evaluation and management of some hypertension.  He has systolic and diastolic blood pressure elevations.  I think a lot  of his hypertension is exacerbated or may be entirely caused by his obstructive sleep apnea.  Another major contributor is his obesity. ?We will start her on hydrochlorothiazide 25 mg a day as well as potassium chloride 10 mill equivalents twice a day.  We will start him on losartan 50 mg a day. ?Basic metabolic profile in 3 weeks.  I will see him again in 3 months for follow-up visit. ? ?2.  Obstructive sleep apnea: He is being seen by Geraldo Pitter at [facility] pulmonary medicine.  I think that he will see a lot of benefit from adequate treatment of his obstructive sleep apnea. ? ? ?   ? ?   ? ? ?Medication Adjustments/Labs and Tests Ordered: ?Current medicines are reviewed at length with the patient today.  Concerns regarding medicines are outlined above.  ?Orders Placed This Encounter  ?Procedures  ? Basic metabolic panel  ? EKG 12-Lead  ? ?Meds ordered this encounter  ?Medications  ? losartan (COZAAR) 50 MG tablet  ?  Sig: Take 1 tablet (50 mg total) by mouth daily.  ?  Dispense:  90 tablet  ?  Refill:  3  ? potassium chloride (KLOR-CON) 10 MEQ tablet  ?  Sig: Take 1 tablet (10 mEq total) by mouth 2 (two) times daily.  ?  Dispense:  180 tablet  ?  Refill:  3  ? hydrochlorothiazide (HYDRODIURIL) 25 MG tablet  ?  Sig: Take 1 tablet (25 mg total) by mouth daily.  ?  Dispense:  90 tablet  ?  Refill:  3  ? ? ? ?Patient Instructions  ?Medication Instructions:  ?Your physician has recommended you make the following change in your medication:  ? ?START: Hydrochlorothiazide 25mg  daily ?START: Potassium 3meq twice daily ?START: Losartan 50mg  daily ? ?*If you need a  refill on your cardiac medications before your next appointment, please call your pharmacy* ? ? ?Lab Work: ?Your physician recommends that you return for lab work on 07/04/2021. You may come anytime that day b

## 2021-06-13 NOTE — Patient Instructions (Signed)
Medication Instructions:  ?Your physician has recommended you make the following change in your medication:  ? ?START: Hydrochlorothiazide 25mg  daily ?START: Potassium 13meq twice daily ?START: Losartan 50mg  daily ? ?*If you need a refill on your cardiac medications before your next appointment, please call your pharmacy* ? ? ?Lab Work: ?Your physician recommends that you return for lab work on 07/04/2021. You may come anytime that day between 7:30am - 4:30pm.  ?If you have labs (blood work) drawn today and your tests are completely normal, you will receive your results only by: ?MyChart Message (if you have MyChart) OR ?A paper copy in the mail ?If you have any lab test that is abnormal or we need to change your treatment, we will call you to review the results. ? ? ?Follow-Up: ?At [facility], you and your health needs are our priority.  As part of our continuing mission to provide you with exceptional heart care, we have created designated Provider Care Teams.  These Care Teams include your primary Cardiologist (physician) and Advanced Practice Providers (APPs -  Physician Assistants and Nurse Practitioners) who all work together to provide you with the care you need, when you need it. ? ? ?Your next appointment:   ?09/13/2021 ?

## 2021-06-14 DIAGNOSIS — M5135 Other intervertebral disc degeneration, thoracolumbar region: Secondary | ICD-10-CM | POA: Diagnosis not present

## 2021-06-14 DIAGNOSIS — M5136 Other intervertebral disc degeneration, lumbar region: Secondary | ICD-10-CM | POA: Diagnosis not present

## 2021-06-16 ENCOUNTER — Telehealth: Payer: Self-pay | Admitting: Pulmonary Disease

## 2021-06-16 DIAGNOSIS — M5136 Other intervertebral disc degeneration, lumbar region: Secondary | ICD-10-CM | POA: Diagnosis not present

## 2021-06-16 DIAGNOSIS — M5135 Other intervertebral disc degeneration, thoracolumbar region: Secondary | ICD-10-CM | POA: Diagnosis not present

## 2021-06-16 DIAGNOSIS — G4733 Obstructive sleep apnea (adult) (pediatric): Secondary | ICD-10-CM | POA: Diagnosis not present

## 2021-06-16 NOTE — Telephone Encounter (Signed)
Call patient ? ?Sleep study result ? ?Date of study: ?06/03/2021 ? ?Impression: ?Moderate obstructive sleep apnea ?Moderate oxygen desaturations ? ?Recommendation: ?DME referral ? ?Recommend CPAP therapy for moderate obstructive sleep apnea ? ?Auto titrating CPAP with pressure settings of 5-15 will be appropriate ? ?Encourage weight loss measures ? ?Follow-up in the office 4 to 6 weeks following initiation of treatment ? ? ?FYI: Ames Dura ?

## 2021-06-17 NOTE — Telephone Encounter (Signed)
Please let patient know HST on 06/03/21 showed moderate OSA. Can we get a virtual visit to discuss sleep study results and treatment such as cpap.

## 2021-06-17 NOTE — Telephone Encounter (Signed)
Spoke to patient and scheduled appt 06/22/2021 at 10:30. ?Patient voiced his understanding.  ?Nothing further needed.  ? ?

## 2021-06-20 ENCOUNTER — Telehealth: Payer: Self-pay | Admitting: Cardiovascular Disease

## 2021-06-20 DIAGNOSIS — Z79899 Other long term (current) drug therapy: Secondary | ICD-10-CM

## 2021-06-20 MED ORDER — POTASSIUM CHLORIDE CRYS ER 20 MEQ PO TBCR: 20.0000 meq | EXTENDED_RELEASE_TABLET | Freq: Two times a day (BID) | ORAL | 3 refills | Status: DC

## 2021-06-20 NOTE — Progress Notes (Signed)
?Terrilee Files D.O. ?[facility] ?[address] [place] [address] ?Phone: [phone] ?Subjective:   ?I, [name], am serving as a scribe for Dr. Antoine Primas. ? ?This visit occurred during the SARS-CoV-2 public health emergency.  Safety protocols were in place, including screening questions prior to the visit, additional usage of staff PPE, and extensive cleaning of exam room while observing appropriate contact time as indicated for disinfecting solutions.  ? ? ?I'm seeing this patient by the request  of:  Porfirio Oar, PA ? ?CC: Neck exam does continue to get pain and shoulder pain ? ?W[facility]:ENIDPOEUMP  ?Frank [name] is a 31 y.o. male coming in with complaint of back and neck pain. OMT 06/01/2021. Patient states that he has had good and bad days. Epidural has been helpful. States that his pain worsened in his back one day and he was walking down stairs and pulled L calf. Feels that he is compensating and his hips are out of alignment.  ? ?Did sleep study and is getting CPAP today.  ? ?Medications patient has been prescribed: Cymbalta, Synthroid ? ?Taking: ? ? ?  ? ? ? ? ?Reviewed prior external information including notes and imaging from previsou exam, outside providers and external EMR if available.  ? ?As well as notes that were available from care everywhere and other healthcare systems. ? ?Past medical history, social, surgical and family history all reviewed in electronic medical record.  No pertanent information unless stated regarding to the chief complaint.  ? ?Past Medical History:  ?Diagnosis Date  ? ADD (attention deficit disorder)   ? Anxiety   ? Narcolepsy through school  ?  ?No Known Allergies ? ? ?Review of Systems: ? No headache, visual changes, nausea, vomiting, diarrhea, constipation, dizziness, abdominal pain, skin rash, fevers, chills, night sweats, weight loss, swollen lymph nodes,  joint swelling, chest pain, shortness of breath, mood changes. POSITIVE muscle aches,  body aches ? ?Objective  ?Blood pressure 122/88, pulse 78, height 6' (1.829 m), weight 270 lb (122.5 kg), SpO2 98 %. ?  ?General: No apparent distress alert and oriented x3 mood and affect normal, dressed appropriately.  ?HEENT: Pupils equal, extraocular movements intact  ?Respiratory: Patient's speak in full sentences and does not appear short of breath  ?Cardiovascular: No lower extremity edema, non tender, no erythema  ?Gait normal with good balance and coordination.  ?MSK:  Non tender with full range of motion and good stability and symmetric strength and tone of shoulders, elbows, wrist, hip, knee and ankles bilaterally.  ?Back -back exam does have some loss of lordosis.  Some tenderness to palpation of the paraspinal musculature.  Patient does have a tightness noted. ? ?Osteopathic findings ? ?C2 flexed rotated and side bent right ?C6 flexed rotated and side bent left ?T3 extended rotated and side bent right inhaled rib ?T9 extended rotated and side bent left ?L2 flexed rotated and side bent right ?Sacrum right on right ? ? ? ? ?  ?Assessment and Plan: ? ?Acute left lumbar radiculopathy ?Patient has responded well to the epidural or nerve root injections.  The patient though unfortunately it is more short-term.  Still wants to avoid any surgical intervention which I think is a good idea.  We discussed with patient icing regimen and home exercises.  We discussed which activities to keep working on.  I would like to hold on trying another epidural for some time.  Patient has stopped the opioids and encourage patient to continue to make  improvement otherwise.  Follow-up with me again in 4 to 6 weeks. ? ?Low testosterone in male ?Recheck patient's testosterone at this time. ? ?Snoring ?Getting his CPAP today.  ? ?Nonallopathic problems ? ?Decision today to treat with OMT was based on Physical Exam ? ?After verbal consent patient was treated with HVLA, ME, FPR techniques in cervical, rib, thoracic, lumbar, and  sacral  areas ? ?Patient tolerated the procedure well with improvement in symptoms ? ?Patient given exercises, stretches and lifestyle modifications ? ?See medications in patient instructions if given ? ?Patient will follow up in 4-8 weeks ? ?  ? ?The above documentation has been reviewed and is accurate and complete [name], DO ? ? ? ?  ? ? Note: This dictation was prepared with Dragon dictation along with smaller phrase technology. Any transcriptional errors that result from this process are unintentional.    ?  ?  ? ?

## 2021-06-20 NOTE — Telephone Encounter (Signed)
Called and left patient a detailed message. Lab appt scheduled for Friday 06/24/21 and increased potassium sent into pharmacy on file at this time. ?

## 2021-06-20 NOTE — Telephone Encounter (Signed)
Received a Text message from Frank [name] this past weekend ?He is having lots of muscle cramping  ?Is taking HCTZ and Kdur 10 meq BID ? ?Lets increase his Kdur to 20 meq BID  ?Check bmp in several days  ?Make sure he is drinking enough water.  ? ? ?Please ask him to measure his BP and report back to [place]  ? ? ? ?PN ?

## 2021-06-21 DIAGNOSIS — M5136 Other intervertebral disc degeneration, lumbar region: Secondary | ICD-10-CM | POA: Diagnosis not present

## 2021-06-21 DIAGNOSIS — M5135 Other intervertebral disc degeneration, thoracolumbar region: Secondary | ICD-10-CM | POA: Diagnosis not present

## 2021-06-22 ENCOUNTER — Encounter: Payer: Self-pay | Admitting: Family Medicine

## 2021-06-22 ENCOUNTER — Encounter: Payer: Self-pay | Admitting: Primary Care

## 2021-06-22 ENCOUNTER — Ambulatory Visit (INDEPENDENT_AMBULATORY_CARE_PROVIDER_SITE_OTHER): Payer: BC Managed Care – PPO | Admitting: Family Medicine

## 2021-06-22 ENCOUNTER — Telehealth (INDEPENDENT_AMBULATORY_CARE_PROVIDER_SITE_OTHER): Payer: BC Managed Care – PPO | Admitting: Primary Care

## 2021-06-22 VITALS — BP 122/88 | HR 78 | Ht 72.0 in | Wt 270.0 lb

## 2021-06-22 DIAGNOSIS — M9904 Segmental and somatic dysfunction of sacral region: Secondary | ICD-10-CM | POA: Diagnosis not present

## 2021-06-22 DIAGNOSIS — G4733 Obstructive sleep apnea (adult) (pediatric): Secondary | ICD-10-CM

## 2021-06-22 DIAGNOSIS — M9902 Segmental and somatic dysfunction of thoracic region: Secondary | ICD-10-CM

## 2021-06-22 DIAGNOSIS — R0683 Snoring: Secondary | ICD-10-CM

## 2021-06-22 DIAGNOSIS — R7989 Other specified abnormal findings of blood chemistry: Secondary | ICD-10-CM

## 2021-06-22 DIAGNOSIS — M9901 Segmental and somatic dysfunction of cervical region: Secondary | ICD-10-CM | POA: Diagnosis not present

## 2021-06-22 DIAGNOSIS — M5416 Radiculopathy, lumbar region: Secondary | ICD-10-CM

## 2021-06-22 DIAGNOSIS — M2550 Pain in unspecified joint: Secondary | ICD-10-CM | POA: Diagnosis not present

## 2021-06-22 DIAGNOSIS — M9903 Segmental and somatic dysfunction of lumbar region: Secondary | ICD-10-CM | POA: Diagnosis not present

## 2021-06-22 DIAGNOSIS — M9908 Segmental and somatic dysfunction of rib cage: Secondary | ICD-10-CM

## 2021-06-22 DIAGNOSIS — R5383 Other fatigue: Secondary | ICD-10-CM | POA: Diagnosis not present

## 2021-06-22 LAB — COMPREHENSIVE METABOLIC PANEL
ALT: 49 U/L (ref 0–53)
AST: 43 U/L — ABNORMAL HIGH (ref 0–37)
Albumin: 4.5 g/dL (ref 3.5–5.2)
Alkaline Phosphatase: 51 U/L (ref 39–117)
BUN: 19 mg/dL (ref 6–23)
CO2: 26 mEq/L (ref 19–32)
Calcium: 9.4 mg/dL (ref 8.4–10.5)
Chloride: 101 mEq/L (ref 96–112)
Creatinine, Ser: 1.19 mg/dL (ref 0.40–1.50)
GFR: 82.16 mL/min (ref >60.00)
Glucose, Bld: 99 mg/dL (ref 70–99)
Potassium: 3.8 mEq/L (ref 3.5–5.1)
Sodium: 136 mEq/L (ref 135–145)
Total Bilirubin: 1.5 mg/dL — ABNORMAL HIGH (ref 0.2–1.2)
Total Protein: 7.3 g/dL (ref 6.0–8.3)

## 2021-06-22 LAB — CBC WITH DIFFERENTIAL/PLATELET
Basophils Absolute: 0.1 10*3/uL (ref 0.0–0.1)
Basophils Relative: 0.7 % (ref 0.0–3.0)
Eosinophils Absolute: 0.3 10*3/uL (ref 0.0–0.7)
Eosinophils Relative: 3.9 % (ref 0.0–5.0)
HCT: 43.7 % (ref 39.0–52.0)
Hemoglobin: 15.4 g/dL (ref 13.0–17.0)
Lymphocytes Relative: 31.3 % (ref 12.0–46.0)
Lymphs Abs: 2.2 10*3/uL (ref 0.7–4.0)
MCHC: 35.3 g/dL (ref 30.0–36.0)
MCV: 92.5 fl (ref 78.0–100.0)
Monocytes Absolute: 0.7 10*3/uL (ref 0.1–1.0)
Monocytes Relative: 9.6 % (ref 3.0–12.0)
Neutro Abs: 3.9 10*3/uL (ref 1.4–7.7)
Neutrophils Relative %: 54.5 % (ref 43.0–77.0)
Platelets: 190 10*3/uL (ref 150.0–400.0)
RBC: 4.72 Mil/uL (ref 4.22–5.81)
RDW: 13.2 % (ref 11.5–15.5)
WBC: 7.1 10*3/uL (ref 4.0–10.5)

## 2021-06-22 LAB — FOLLICLE STIMULATING HORMONE: FSH: 6.8 m[IU]/mL (ref 1.4–18.1)

## 2021-06-22 LAB — URIC ACID: Uric Acid, Serum: 8.7 mg/dL — ABNORMAL HIGH (ref 4.0–7.8)

## 2021-06-22 LAB — TESTOSTERONE: Testosterone: 182.62 ng/dL — ABNORMAL LOW (ref 300.00–890.00)

## 2021-06-22 LAB — LUTEINIZING HORMONE: LH: 3.7 m[IU]/mL (ref 1.50–9.30)

## 2021-06-22 NOTE — Assessment & Plan Note (Signed)
Recheck patient's testosterone at this time. ?

## 2021-06-22 NOTE — Assessment & Plan Note (Signed)
Getting his CPAP today. ?

## 2021-06-22 NOTE — Patient Instructions (Signed)
Good to see you  ?I am so happy for you  ?Lets stay where we are until labs ?CMET, CBC, testosterone, free testosterone, FSH, LH, calcium, uric acid  ?In 1 month lets talk about stopping the wellbutrin! ?Keep it up! ?

## 2021-06-22 NOTE — Assessment & Plan Note (Signed)
Patient has responded well to the epidural or nerve root injections.  The patient though unfortunately it is more short-term.  Still wants to avoid any surgical intervention which I think is a good idea.  We discussed with patient icing regimen and home exercises.  We discussed which activities to keep working on.  I would like to hold on trying another epidural for some time.  Patient has stopped the opioids and encourage patient to continue to make improvement otherwise.  Follow-up with me again in 4 to 6 weeks. ?

## 2021-06-22 NOTE — Progress Notes (Signed)
Virtual Visit via Video Note ? ?I connected with Frank [name] on 06/22/21 at 10:30 AM EDT by a video enabled telemedicine application and verified that I am speaking with the correct person using two identifiers. ? ?Location: ?Patient: Home ?Provider: Office  ?  ?I discussed the limitations of evaluation and management by telemedicine and the availability of in person appointments. The patient expressed understanding and agreed to proceed. ? ?History of Present Illness: ?31 year old male, never smoked. PMH significant for ADD, OCD, daytime sleepiness.  ? ?Previous [facility] pulmonary encounter:  ?05/04/2021 ?Patient presents today for sleep consult. He had sleep study in 2016 that showed mild OSA, AHI was 5.4. He was never started on treatment. He has symptoms of loud snoring, witness apnea, restless sleep and daytime sleepiness. He has a family hx sleep apnea. Wife states that his oxygen will drop into the 60s. Typical bedtime is between 8-11pm. He takes 50mg  trazodone for insomnia at bedtime, he has prn Ambien which he uses very infrequently. He wakes up multiple times at night, previously thought this was due to his chronic back pain. He starts his day at 4:30 or 6am. No morning grogginess but reports daytime sleepiness in the afternoon. Drinking a lot of caffeine to stay awake. He takes adderall 15mg  as needed mostly when having to drive, he does not use every day. He has been having palpitations and dizziness. Denies narcolepsy, cataplexy or sleep walking.  ? ?Sleep questionnaire: ?Symptoms- Loud snoring, waking up gasping for air, daytime sleepiness  ?Prior sleep study- Yes, early 67s (results available) ?Bedtime- 8-11pm ?Time to fall asleep- minutes to hours  ?Nocturnal awakenings- too many times  ?Out of bed/start of day- 4:30 or 6am  ?Weight changes- Fluctuates  ?Do you operate heavy machinery- No ?Do you currently wear CPAP-No  ?Do you current wear oxygen- No  ?Epworth - 17 ? ?06/22/2021 ?Patient contacted  today for follow-up. Accompanied by wife on virtual video visit. He had a home sleep test on 06/03/21 that showed moderate obstructive sleep apnea, AHI 23.4/hr with SpO2 low 70% (average 90%). Reviewed treatment options. His daytime sleepiness has worsened, he would like to try CPAP. He is no longer taking Ambien to sleep, remains on trazodone 50mg  at bedtime. He is taking Adderall 15mg  TID as needed for ADD symptoms. ? ?Observations/Objective: ? ?- Appears well; No overt respiratory symptoms ? ?Assessment and Plan: ? ?Moderate OSA: ?- Patient has symptoms of loud snoring, waking up gasping for air and daytime sleepiness. Home sleep study on 06/03/21 >> AHI 23.4/hr with SpO2 low 70%. Reviewed treatment options including weight loss, oral appliance, CPAP therapy or referral to ENT. Due to severe of symptoms and daytime sleepiness recommended starting CPAP therapy, patient is agreement with plan.  DME referral placed for new CPAP start auto titrate 5 to 15 cm H2O with mask of choice. Encourage patient aim to wear CPAP every night min 4-6 hours or longer. Advised against driving if experiencing excessive daytime sleepiness. FU in 8 weeks for compliance check.  ? ?Orders: ?Start CPAP 5-15cm h20 with mask of choice/chin strap   ? ?Follow Up Instructions: ? ? 8 weeks with Beth for CPAP compliance  ? ?I discussed the assessment and treatment plan with the patient. The patient was provided an opportunity to ask questions and all were answered. The patient agreed with the plan and demonstrated an understanding of the instructions. ?  ?The patient was advised to call back or seek an in-person evaluation if the  symptoms worsen or if the condition fails to improve as anticipated. ? ?I provided 30 minutes of non-face-to-face time during this encounter. ? ? ?Glenford Bayley, NP ? ?

## 2021-06-22 NOTE — Assessment & Plan Note (Signed)
-   Patient has symptoms of loud snoring, waking up gasping for air and daytime sleepiness. Home sleep study on 06/03/21 >> AHI 23.4/hr with SpO2 low 70%. Reviewed treatment options including weight loss, oral appliance, CPAP therapy or referral to ENT. Due to severe of symptoms and daytime sleepiness recommended starting CPAP therapy, patient is agreement with plan.  DME referral placed for new CPAP start auto titrate 5 to 15 cm H2O with mask of choice. Encourage patient aim to wear CPAP every night min 4-6 hours or longer. Advised against driving if experiencing excessive daytime sleepiness. FU in 8 weeks for compliance check.  ? ?Orders: ?Start CPAP 5-15cm h20 with mask of choice/chin strap   ? ?Follow-up: ?8 weeks with Beth for CPAP compliance  ?

## 2021-06-22 NOTE — Progress Notes (Signed)
Reviewed and agree with assessment/plan. ? ? ?Coralyn Helling, MD ?[facility] Pulmonary/Critical Care ?06/22/2021, 1:28 PM ?Pager:  [phone] ? ?

## 2021-06-22 NOTE — Patient Instructions (Addendum)
?Home sleep study showed moderate obstructive sleep apnea ?Due to severe of symptoms and daytime sleepiness recommended starting CPAP therapy  ?Aim to wear every night min 4-6 hours or longer ?Do not drive if experiencing excessive daytime sleepiness  ? ?Orders: ?Start CPAP 5-15cm h20 with mask of choice/chin strap  ? ?Follow-up: ?8 weeks with Beth for CPAP compliance  ? ? ?CPAP and BIPAP Information ?CPAP and BIPAP are methods that use air pressure to keep your airways open and to help you breathe well. CPAP and BIPAP use different amounts of pressure. Your health care provider will tell you whether CPAP or BIPAP would be more helpful for you. ?CPAP stands for "continuous positive airway pressure." With CPAP, the amount of pressure stays the same while you breathe in (inhale) and out (exhale). ?BIPAP stands for "bi-level positive airway pressure." With BIPAP, the amount of pressure will be higher when you inhale and lower when you exhale. This allows you to take larger breaths. ?CPAP or BIPAP may be used in the hospital, or your health care provider may want you to use it at home. You may need to have a sleep study before your health care provider can order a machine for you to use at home. ?What are the advantages? ?CPAP or BIPAP can be helpful if you have: ?Sleep apnea. ?Chronic obstructive pulmonary disease (COPD). ?Heart failure. ?Medical conditions that cause muscle weakness, including muscular dystrophy or amyotrophic lateral sclerosis (ALS). ?Other problems that cause breathing to be shallow, weak, abnormal, or difficult. ?CPAP and BIPAP are most commonly used for obstructive sleep apnea (OSA) to keep the airways from collapsing when the muscles relax during sleep. ?What are the risks? ?Generally, this is a safe treatment. However, problems may occur, including: ?Irritated skin or skin sores if the mask does not fit properly. ?Dry or stuffy nose or nosebleeds. ?Dry mouth. ?Feeling gassy or bloated. ?Sinus  or lung infection if the equipment is not cleaned properly. ?When should CPAP or BIPAP be used? ?In most cases, the mask only needs to be worn during sleep. Generally, the mask needs to be worn throughout the night and during any daytime naps. People with certain medical conditions may also need to wear the mask at other times, such as when they are awake. Follow instructions from your health care provider about when to use the machine. ?What happens during CPAP or BIPAP? ? ?Both CPAP and BIPAP are provided by a small machine with a flexible plastic tube that attaches to a plastic mask that you wear. Air is blown through the mask into your nose or mouth. The amount of pressure that is used to blow the air can be adjusted on the machine. Your health care provider will set the pressure setting and help you find the best mask for you. ?Tips for using the mask ?Because the mask needs to be snug, some people feel trapped or closed-in (claustrophobic) when first using the mask. If you feel this way, you may need to get used to the mask. One way to do this is to hold the mask loosely over your nose or mouth and then gradually apply the mask more snugly. You can also gradually increase the amount of time that you use the mask. ?Masks are available in various types and sizes. If your mask does not fit well, talk with your health care provider about getting a different one. Some common types of masks include: ?Full face masks, which fit over the mouth and nose. ?Nasal  masks, which fit over the nose. ?Nasal pillow or prong masks, which fit into the nostrils. ?If you are using a mask that fits over your nose and you tend to breathe through your mouth, a chin strap may be applied to help keep your mouth closed. ?Use a skin barrier to protect your skin as told by your health care provider. ?Some CPAP and BIPAP machines have alarms that may sound if the mask comes off or develops a leak. ?If you have trouble with the mask, it is  very important that you talk with your health care provider about finding a way to make the mask easier to tolerate. Do not stop using the mask. There could be a negative impact on your health if you stop using the mask. ?Tips for using the machine ?Place your CPAP or BIPAP machine on a secure table or stand near an electrical outlet. ?Know where the on/off switch is on the machine. ?Follow instructions from your health care provider about how to set the pressure on your machine and when you should use it. ?Do not eat or drink while the CPAP or BIPAP machine is on. Food or fluids could get pushed into your lungs by the pressure of the CPAP or BIPAP. ?For home use, CPAP and BIPAP machines can be rented or purchased through home health care companies. Many different brands of machines are available. Renting a machine before purchasing may help you find out which particular machine works well for you. Your health insurance company may also decide which machine you may get. ?Keep the CPAP or BIPAP machine and attachments clean. Ask your health care provider for specific instructions. ?Check the humidifier if you have a dry stuffy nose or nosebleeds. Make sure it is working correctly. ?Follow these instructions at home: ?Take over-the-counter and prescription medicines only as told by your health care provider. Ask if you can take sinus medicine if your sinuses are blocked. ?Do not use any products that contain nicotine or tobacco. These products include cigarettes, chewing tobacco, and vaping devices, such as e-cigarettes. If you need help quitting, ask your health care provider. ?Keep all follow-up visits. This is important. ?Contact a health care provider if: ?You have redness or pressure sores on your head, face, mouth, or nose from the mask or head gear. ?You have trouble using the CPAP or BIPAP machine. ?You cannot tolerate wearing the CPAP or BIPAP mask. ?Someone tells you that you snore even when wearing your  CPAP or BIPAP. ?Get help right away if: ?You have trouble breathing. ?You feel confused. ?Summary ?CPAP and BIPAP are methods that use air pressure to keep your airways open and to help you breathe well. ?If you have trouble with the mask, it is very important that you talk with your health care provider about finding a way to make the mask easier to tolerate. Do not stop using the mask. There could be a negative impact to your health if you stop using the mask. ?Follow instructions from your health care provider about when to use the machine. ?This information is not intended to replace advice given to you by your health care provider. Make sure you discuss any questions you have with your health care provider. ?Document Revised: 09/08/2020 Document Reviewed: 01/09/2020 ?Elsevier Patient Education ? 2023 Elsevier Inc[employer] ? ? ? ?

## 2021-06-23 DIAGNOSIS — M5135 Other intervertebral disc degeneration, thoracolumbar region: Secondary | ICD-10-CM | POA: Diagnosis not present

## 2021-06-23 DIAGNOSIS — M5136 Other intervertebral disc degeneration, lumbar region: Secondary | ICD-10-CM | POA: Diagnosis not present

## 2021-06-23 LAB — TESTOSTERONE TOTAL,FREE,BIO, MALES
Albumin: 4.4 g/dL (ref 3.6–5.1)
Sex Hormone Binding: 17 nmol/L (ref 10–50)
Testosterone: 215 ng/dL — ABNORMAL LOW (ref 250–827)

## 2021-06-23 LAB — CALCIUM, IONIZED: Calcium, Ion: 5.1 mg/dL (ref 4.7–5.5)

## 2021-06-24 ENCOUNTER — Other Ambulatory Visit: Payer: BC Managed Care – PPO

## 2021-06-28 ENCOUNTER — Other Ambulatory Visit: Payer: Self-pay | Admitting: Family Medicine

## 2021-06-28 DIAGNOSIS — M5136 Other intervertebral disc degeneration, lumbar region: Secondary | ICD-10-CM | POA: Diagnosis not present

## 2021-06-28 DIAGNOSIS — M5135 Other intervertebral disc degeneration, thoracolumbar region: Secondary | ICD-10-CM | POA: Diagnosis not present

## 2021-06-30 DIAGNOSIS — M5135 Other intervertebral disc degeneration, thoracolumbar region: Secondary | ICD-10-CM | POA: Diagnosis not present

## 2021-06-30 DIAGNOSIS — M5136 Other intervertebral disc degeneration, lumbar region: Secondary | ICD-10-CM | POA: Diagnosis not present

## 2021-07-01 DIAGNOSIS — G4733 Obstructive sleep apnea (adult) (pediatric): Secondary | ICD-10-CM | POA: Diagnosis not present

## 2021-07-04 ENCOUNTER — Other Ambulatory Visit: Payer: BC Managed Care – PPO

## 2021-07-05 DIAGNOSIS — M5135 Other intervertebral disc degeneration, thoracolumbar region: Secondary | ICD-10-CM | POA: Diagnosis not present

## 2021-07-05 DIAGNOSIS — M5136 Other intervertebral disc degeneration, lumbar region: Secondary | ICD-10-CM | POA: Diagnosis not present

## 2021-07-07 NOTE — Progress Notes (Signed)
[name] [address] [place] [address] Phone: [phone] Subjective:   Frank [name], am serving as a scribe for Dr. Antoine Primas.  I'm seeing this patient by the request  of:  Porfirio Oar, PA  CC: Low back pain, neck pain follow-up  [id number]  Frank [name] is a 31 y.o. male coming in with complaint of back and neck pain. OMT 06/22/2021. Patient states doing well. On Cpap for 2 weeks now. Same per usual. Refill duloxetine. No new complaints.  Patient feels like he is making progress.  Still with pain at all times but does think that it may be less.  Also using certain things such as muscle relaxers last.  Medications patient has been prescribed: Synthroid methocarbamol, Cymbalta, trazodone  Taking:         Reviewed prior external information including notes and imaging from previsou exam, outside providers and external EMR if available.   As well as notes that were available from care everywhere and other healthcare systems.  Past medical history, social, surgical and family history all reviewed in electronic medical record.  No pertanent information unless stated regarding to the chief complaint.   Past Medical History:  Diagnosis Date   ADD (attention deficit disorder)    Anxiety    Narcolepsy through school    No Known Allergies   Review of Systems:  No headache, visual changes, nausea, vomiting, diarrhea, constipation, dizziness, abdominal pain, skin rash, fevers, chills, night sweats, weight loss, swollen lymph nodes, joint swelling, chest pain, shortness of breath, mood changes. POSITIVE muscle aches, body aches  Objective  Blood pressure 122/84, pulse 86, height 6' (1.829 m), weight 277 lb (125.6 kg), SpO2 97 %.   General: No apparent distress alert and oriented x3 mood and affect normal, dressed appropriately.  HEENT: Pupils equal, extraocular movements intact  Respiratory: Patient's speak in full  sentences and does not appear short of breath  Cardiovascular: No lower extremity edema, non tender, no erythema  Neck exam does show some mild loss of lordosis.  Tightness noted though more in the thoracic area than anywhere else.  Patient does have some loss of lordosis as well.  Low back exam does have tightness with FABER test.  Still has some limited range of motion of the last 5 degrees of extension of the thoracolumbar juncture  Osteopathic findings  C4 flexed rotated and side bent left T3 extended rotated and side bent right inhaled rib T8 extended rotated and side bent left L1 flexed rotated and side bent right Sacrum right on right       Assessment and Plan:  Scheuermann's disease Bindubal and the patient continues to make some progress. Patient continues to have multiple different pains but has responded well to physical therapy as well as with certain things and modalities such as dry needling.  I do think the patient could still respond well to formal physical therapy 1 time a week for another 3 months.  We will see if we can get approval for this.  Discussed with patient about posture and ergonomics otherwise.  Continuing to workout on a regular basis.  Follow-up with me again in 4 to 8 weeks.  Refilled medications including the Cymbalta, trazodone as well.  Total time 33 minutes   Nonallopathic problems  Decision today to treat with OMT was based on Physical Exam  After verbal consent patient was treated with HVLA, ME, FPR techniques in cervical, rib, thoracic, lumbar, and sacral  areas  Patient tolerated the procedure well with improvement in symptoms  Patient given exercises, stretches and lifestyle modifications  See medications in patient instructions if given  Patient will follow up in 4-8 weeks      The above documentation has been reviewed and is accurate and complete [name], DO        Note: This dictation was prepared with Dragon dictation  along with smaller phrase technology. Any transcriptional errors that result from this process are unintentional.

## 2021-07-12 ENCOUNTER — Other Ambulatory Visit (HOSPITAL_COMMUNITY): Payer: Self-pay

## 2021-07-12 ENCOUNTER — Other Ambulatory Visit: Payer: Self-pay | Admitting: Family Medicine

## 2021-07-12 DIAGNOSIS — M5136 Other intervertebral disc degeneration, lumbar region: Secondary | ICD-10-CM | POA: Diagnosis not present

## 2021-07-12 DIAGNOSIS — M5135 Other intervertebral disc degeneration, thoracolumbar region: Secondary | ICD-10-CM | POA: Diagnosis not present

## 2021-07-13 ENCOUNTER — Ambulatory Visit (INDEPENDENT_AMBULATORY_CARE_PROVIDER_SITE_OTHER): Payer: BC Managed Care – PPO | Admitting: Family Medicine

## 2021-07-13 VITALS — BP 122/84 | HR 86 | Ht 72.0 in | Wt 277.0 lb

## 2021-07-13 DIAGNOSIS — M9902 Segmental and somatic dysfunction of thoracic region: Secondary | ICD-10-CM

## 2021-07-13 DIAGNOSIS — M545 Low back pain, unspecified: Secondary | ICD-10-CM

## 2021-07-13 DIAGNOSIS — M9904 Segmental and somatic dysfunction of sacral region: Secondary | ICD-10-CM

## 2021-07-13 DIAGNOSIS — M9901 Segmental and somatic dysfunction of cervical region: Secondary | ICD-10-CM

## 2021-07-13 DIAGNOSIS — M4200 Juvenile osteochondrosis of spine, site unspecified: Secondary | ICD-10-CM | POA: Diagnosis not present

## 2021-07-13 DIAGNOSIS — M9908 Segmental and somatic dysfunction of rib cage: Secondary | ICD-10-CM

## 2021-07-13 DIAGNOSIS — M9903 Segmental and somatic dysfunction of lumbar region: Secondary | ICD-10-CM | POA: Diagnosis not present

## 2021-07-13 MED ORDER — METHOCARBAMOL 500 MG PO TABS: 500.0000 mg | ORAL_TABLET | Freq: Three times a day (TID) | ORAL | 0 refills | Status: DC

## 2021-07-13 MED ORDER — DULOXETINE HCL 60 MG PO CPEP: ORAL_CAPSULE | ORAL | 0 refills | Status: DC

## 2021-07-13 NOTE — Patient Instructions (Signed)
Heel lifts Refer [place] [facility] Glad we are doing better

## 2021-07-13 NOTE — Assessment & Plan Note (Signed)
Bindubal and the patient continues to make some progress. Patient continues to have multiple different pains but has responded well to physical therapy as well as with certain things and modalities such as dry needling.  I do think the patient could still respond well to formal physical therapy 1 time a week for another 3 months.  We will see if we can get approval for this.  Discussed with patient about posture and ergonomics otherwise.  Continuing to workout on a regular basis.  Follow-up with me again in 4 to 8 weeks.  Refilled medications including the Cymbalta, trazodone as well.  Total time 33 minutes

## 2021-07-14 ENCOUNTER — Other Ambulatory Visit (HOSPITAL_COMMUNITY): Payer: Self-pay

## 2021-07-19 ENCOUNTER — Other Ambulatory Visit (HOSPITAL_COMMUNITY): Payer: Self-pay

## 2021-07-19 DIAGNOSIS — M5135 Other intervertebral disc degeneration, thoracolumbar region: Secondary | ICD-10-CM | POA: Diagnosis not present

## 2021-07-19 DIAGNOSIS — M5136 Other intervertebral disc degeneration, lumbar region: Secondary | ICD-10-CM | POA: Diagnosis not present

## 2021-07-19 MED ORDER — AMPHETAMINE-DEXTROAMPHETAMINE 15 MG PO TABS
ORAL_TABLET | ORAL | 0 refills | Status: DC
  Filled 2021-07-19: qty 75, 30d supply, fill #0

## 2021-07-20 ENCOUNTER — Other Ambulatory Visit (HOSPITAL_COMMUNITY): Payer: Self-pay

## 2021-07-20 MED ORDER — OXYCODONE-ACETAMINOPHEN 10-325 MG PO TABS
ORAL_TABLET | ORAL | 0 refills | Status: DC
  Filled 2021-07-20: qty 90, 30d supply, fill #0

## 2021-07-26 DIAGNOSIS — M5136 Other intervertebral disc degeneration, lumbar region: Secondary | ICD-10-CM | POA: Diagnosis not present

## 2021-07-26 DIAGNOSIS — M5135 Other intervertebral disc degeneration, thoracolumbar region: Secondary | ICD-10-CM | POA: Diagnosis not present

## 2021-07-27 DIAGNOSIS — G47419 Narcolepsy without cataplexy: Secondary | ICD-10-CM | POA: Diagnosis not present

## 2021-07-27 DIAGNOSIS — F988 Other specified behavioral and emotional disorders with onset usually occurring in childhood and adolescence: Secondary | ICD-10-CM | POA: Diagnosis not present

## 2021-07-27 DIAGNOSIS — Z79891 Long term (current) use of opiate analgesic: Secondary | ICD-10-CM | POA: Diagnosis not present

## 2021-07-28 DIAGNOSIS — E230 Hypopituitarism: Secondary | ICD-10-CM | POA: Diagnosis not present

## 2021-07-30 ENCOUNTER — Other Ambulatory Visit: Payer: Self-pay | Admitting: Family Medicine

## 2021-08-01 DIAGNOSIS — G4733 Obstructive sleep apnea (adult) (pediatric): Secondary | ICD-10-CM | POA: Diagnosis not present

## 2021-08-01 NOTE — Progress Notes (Signed)
[name] [address] [place] [address] Phone: [phone] Subjective:   Frank [name], am serving as a scribe for Dr. Antoine Primas.  I'm seeing this patient by the request  of:  Porfirio Oar, PA  CC: Neck and back pain follow-up  [id number]  Frank [name] is a 31 y.o. male coming in with complaint of back and neck pain. OMT 07/13/2021. Epidural April 2023. Patient states that he has been doing ok. No major changes since last visit.    Medications patient has been prescribed: Synthroid, Trazadone, Cymbalta  Taking:         Reviewed prior external information including notes and imaging from previsou exam, outside providers and external EMR if available.   As well as notes that were available from care everywhere and other healthcare systems.  Past medical history, social, surgical and family history all reviewed in electronic medical record.  No pertanent information unless stated regarding to the chief complaint.   Past Medical History:  Diagnosis Date   ADD (attention deficit disorder)    Anxiety    Narcolepsy through school    No Known Allergies   Review of Systems:  No headache, visual changes, nausea, vomiting, diarrhea, constipation, dizziness, abdominal pain, skin rash, fevers, chills, night sweats, weight loss, swollen lymph nodes, body aches, joint swelling, chest pain, shortness of breath, mood changes. POSITIVE muscle aches  Objective  Blood pressure 112/88, pulse 84, height 6' (1.829 m), weight 285 lb (129.3 kg), SpO2 99 %.   General: No apparent distress alert and oriented x3 mood and affect normal, dressed appropriately.  HEENT: Pupils equal, extraocular movements intact  Respiratory: Patient's speak in full sentences and does not appear short of breath  Cardiovascular: No lower extremity edema, non tender, no erythema  MSK:  Back low back exam still has tightness with straight leg test noted.  No  significant weakness noted.  Patient does lack the last 5 degrees of extension.  Tightness with FABER test on the left greater than the right as well as noted today.  Osteopathic findings  C2 flexed rotated and side bent right C6 flexed rotated and side bent left T3 extended rotated and side bent right inhaled rib T9 extended rotated and side bent left L2 flexed rotated and side bent right Sacrum left on left       Assessment and Plan:  Acute left lumbar radiculopathy Continue to have current trouble. Discussed different treatment options.  Patient essentially had good relief with some of the epidurals he would like to consider it again.  At this point I think that is fine but at the same juncture I wonder if patient could respond well to a radiofrequency ablation secondary to his age.  We will consider this.  Patient will follow-up with me again in 4 to 6 weeks after the injection to see how he is responding.  Still responding relatively well to formal physical therapy and he can continue it if it is helping including the dry needling.  We discussed potential scraping.  Follow-up with me again for more manipulation as well in 4 to 6 weeks    Nonallopathic problems  Decision today to treat with OMT was based on Physical Exam  After verbal consent patient was treated with HVLA, ME, FPR techniques in cervical, rib, thoracic, lumbar, and sacral  areas  Patient tolerated the procedure well with improvement in symptoms  Patient given exercises, stretches and lifestyle modifications  See medications  in patient instructions if given  Patient will follow up in 4-8 weeks      The above documentation has been reviewed and is accurate and complete [name], DO        Note: This dictation was prepared with Dragon dictation along with smaller phrase technology. Any transcriptional errors that result from this process are unintentional.

## 2021-08-02 DIAGNOSIS — M5135 Other intervertebral disc degeneration, thoracolumbar region: Secondary | ICD-10-CM | POA: Diagnosis not present

## 2021-08-02 DIAGNOSIS — M5136 Other intervertebral disc degeneration, lumbar region: Secondary | ICD-10-CM | POA: Diagnosis not present

## 2021-08-03 ENCOUNTER — Ambulatory Visit (INDEPENDENT_AMBULATORY_CARE_PROVIDER_SITE_OTHER): Payer: BC Managed Care – PPO | Admitting: Family Medicine

## 2021-08-03 VITALS — BP 112/88 | HR 84 | Ht 72.0 in | Wt 285.0 lb

## 2021-08-03 DIAGNOSIS — M9908 Segmental and somatic dysfunction of rib cage: Secondary | ICD-10-CM

## 2021-08-03 DIAGNOSIS — M9901 Segmental and somatic dysfunction of cervical region: Secondary | ICD-10-CM

## 2021-08-03 DIAGNOSIS — M9904 Segmental and somatic dysfunction of sacral region: Secondary | ICD-10-CM | POA: Diagnosis not present

## 2021-08-03 DIAGNOSIS — M9902 Segmental and somatic dysfunction of thoracic region: Secondary | ICD-10-CM

## 2021-08-03 DIAGNOSIS — M5416 Radiculopathy, lumbar region: Secondary | ICD-10-CM

## 2021-08-03 DIAGNOSIS — M9903 Segmental and somatic dysfunction of lumbar region: Secondary | ICD-10-CM | POA: Diagnosis not present

## 2021-08-03 NOTE — Patient Instructions (Signed)
Good to see you Overall mm tightness Epidural [phone] Consider RFA at a later date Watch cyst on thumb See me again in 1-2 months

## 2021-08-03 NOTE — Assessment & Plan Note (Signed)
Continue to have current trouble. Discussed different treatment options.  Patient essentially had good relief with some of the epidurals he would like to consider it again.  At this point I think that is fine but at the same juncture I wonder if patient could respond well to a radiofrequency ablation secondary to his age.  We will consider this.  Patient will follow-up with me again in 4 to 6 weeks after the injection to see how he is responding.  Still responding relatively well to formal physical therapy and he can continue it if it is helping including the dry needling.  We discussed potential scraping.  Follow-up with me again for more manipulation as well in 4 to 6 weeks

## 2021-08-05 ENCOUNTER — Inpatient Hospital Stay
Admission: RE | Admit: 2021-08-05 | Discharge: 2021-08-05 | Disposition: A | Discharge status: Home / Self Care | Payer: BC Managed Care – PPO | Source: Ambulatory Visit | Attending: Family Medicine | Admitting: Family Medicine

## 2021-08-09 ENCOUNTER — Other Ambulatory Visit (HOSPITAL_COMMUNITY): Payer: Self-pay

## 2021-08-09 DIAGNOSIS — M5135 Other intervertebral disc degeneration, thoracolumbar region: Secondary | ICD-10-CM | POA: Diagnosis not present

## 2021-08-09 DIAGNOSIS — M5136 Other intervertebral disc degeneration, lumbar region: Secondary | ICD-10-CM | POA: Diagnosis not present

## 2021-08-09 MED ORDER — BENZONATATE 200 MG PO CAPS
ORAL_CAPSULE | ORAL | 0 refills | Status: DC
  Filled 2021-08-09: qty 20, 6d supply, fill #0

## 2021-08-09 MED ORDER — OXYCODONE-ACETAMINOPHEN 10-325 MG PO TABS
ORAL_TABLET | ORAL | 0 refills | Status: DC
  Filled 2021-08-17 – 2021-09-11 (×2): qty 90, 30d supply, fill #0

## 2021-08-09 MED ORDER — ALBUTEROL SULFATE HFA 108 (90 BASE) MCG/ACT IN AERS
INHALATION_SPRAY | RESPIRATORY_TRACT | 1 refills | Status: DC
  Filled 2021-08-09: qty 6.7, 16d supply, fill #0
  Filled 2021-09-11: qty 6.7, 16d supply, fill #1

## 2021-08-12 ENCOUNTER — Other Ambulatory Visit (HOSPITAL_COMMUNITY): Payer: Self-pay

## 2021-08-12 NOTE — Progress Notes (Signed)
[name] [address] [place] [address] Phone: [phone] Subjective:   Frank [name], am serving as a scribe for Dr. Antoine Primas.  I'm seeing this patient by the request  of:  Porfirio Oar, PA  CC: neck and back pain   [id number]  Frank [name] is a 31 y.o. male coming in with complaint of back and neck pain. OMT 08/03/2021. Patient states feeling good about epidural. Wants to talk medication. Thumb cyst is getting bigger.  Medications patient has been prescribed: Synthroid, Trazadone  Taking:         Reviewed prior external information including notes and imaging from previsou exam, outside providers and external EMR if available.   As well as notes that were available from care everywhere and other healthcare systems.  Past medical history, social, surgical and family history all reviewed in electronic medical record.  No pertanent information unless stated regarding to the chief complaint.   Past Medical History:  Diagnosis Date   ADD (attention deficit disorder)    Anxiety    Narcolepsy through school    No Known Allergies   Review of Systems:  No headache, visual changes, nausea, vomiting, diarrhea, constipation, dizziness, abdominal pain, skin rash, fevers, chills, night sweats, weight loss, swollen lymph nodes, body aches, joint swelling, chest pain, shortness of breath, mood changes. POSITIVE muscle aches  Objective  Blood pressure 124/82, pulse 88, height 6' (1.829 m), weight 284 lb (128.8 kg), SpO2 96 %.   General: No apparent distress alert and oriented x3 mood and affect normal, dressed appropriately.  HEENT: Pupils equal, extraocular movements intact  Respiratory: Patient's speak in full sentences and does not appear short of breath  Cardiovascular: No lower extremity edema, non tender, no erythema  Gait antalgic  MSK:  Back back exam shows the patient does have some mild loss of lordosis.  More  tightness in the thoracolumbar junction. Tightness noted with FABER  Fullness noted of the left maxillary  Right thumb noted cyst mildly tender.  Near the IP on the ulnar side of the thumb  Limited muscular skeletal ultrasound was performed and interpreted by Antoine Primas, M  Limited ultrasound of patient's thumb shows the patient does have calcific changes within this subcutaneous cyst noted.  No abnormal vascularity but does have some increasing Doppler flow surrounding the area consistent with inflammation.  Appears to not communicate with the joint Impression: Calcific subcutaneous cyst    Osteopathic findings  C2 flexed rotated and side bent right C7 flexed rotated and side bent left T3 extended rotated and side bent right inhaled rib T9 extended rotated and side bent left L2 flexed rotated and side bent right Sacrum right on right       Assessment and Plan:  Mass of skin of right thumb Appears to be a subcutaneous cyst noted.  Some also has significant calcific aspect.  I do not see any abnormal blood flow coming from it.  I do believe secondary to the size of the calcific aspect would not respond extremely well to aspiration and sent to hand surgery to discuss removal.  Sinusitis, chronic Patient has had difficulty with his ear and chronic sinusitis of the maxillary and it appears on the left side.  Will refer to ENT for further evaluation and see if there is anything that could be beneficial.    Nonallopathic problems  Decision today to treat with OMT was based on Physical Exam  After verbal consent patient  was treated with HVLA, ME, FPR techniques in cervical, rib, thoracic, lumbar, and sacral  areas  Patient tolerated the procedure well with improvement in symptoms  Patient given exercises, stretches and lifestyle modifications  See medications in patient instructions if given  Patient will follow up in 4-8 weeks      The above documentation has been  reviewed and is accurate and complete [name], DO        Note: This dictation was prepared with Dragon dictation along with smaller phrase technology. Any transcriptional errors that result from this process are unintentional.

## 2021-08-15 ENCOUNTER — Other Ambulatory Visit (HOSPITAL_COMMUNITY): Payer: Self-pay

## 2021-08-15 MED ORDER — AMPHETAMINE-DEXTROAMPHETAMINE 30 MG PO TABS
ORAL_TABLET | ORAL | 0 refills | Status: DC
  Filled 2021-08-17: qty 75, 30d supply, fill #0

## 2021-08-17 ENCOUNTER — Telehealth: Payer: Self-pay | Admitting: Family Medicine

## 2021-08-17 ENCOUNTER — Other Ambulatory Visit (HOSPITAL_COMMUNITY): Payer: Self-pay

## 2021-08-17 NOTE — Telephone Encounter (Signed)
Spoke with patient and recommended to make a decision based on comfort level pain wise and doctor wise.

## 2021-08-17 NOTE — Telephone Encounter (Signed)
Patient called stating that he was scheduled with Dr Archer Asa for his next epidural but had to reschedule due to the doctor being out of town.  He said that this has happened twice now. He has preferred to see Dr Archer Asa due to the last epidural working so well with him. He waited 15 days to see him again tomorrow but now has to reschedule due to the provider not being in the office.  They have him scheduled to see another provider but was hesitant to do so. If he waits for Dr Archer Asa, it would be another 15 days.  Does Dr Katrinka Blazing think this is okay?  (I did tell him that he was out of the office this week.)  He said that he feels like he is getting the run around and felt weird about the situation.

## 2021-08-18 ENCOUNTER — Ambulatory Visit
Admission: RE | Admit: 2021-08-18 | Discharge: 2021-08-18 | Disposition: A | Discharge status: Home / Self Care | Payer: BC Managed Care – PPO | Source: Ambulatory Visit | Attending: Family Medicine | Admitting: Family Medicine

## 2021-08-18 ENCOUNTER — Inpatient Hospital Stay: Admission: RE | Admit: 2021-08-18 | Payer: BC Managed Care – PPO | Source: Ambulatory Visit

## 2021-08-18 ENCOUNTER — Other Ambulatory Visit (HOSPITAL_COMMUNITY): Payer: Self-pay

## 2021-08-18 DIAGNOSIS — M5416 Radiculopathy, lumbar region: Secondary | ICD-10-CM

## 2021-08-18 MED ORDER — IOPAMIDOL (ISOVUE-M 200) INJECTION 41%: 1.0000 mL | Freq: Once | INTRAMUSCULAR | Status: DC

## 2021-08-18 MED ORDER — METHYLPREDNISOLONE ACETATE 40 MG/ML INJ SUSP (RADIOLOG: 80.0000 mg | Freq: Once | INTRAMUSCULAR | Status: DC

## 2021-08-18 NOTE — Discharge Instructions (Signed)

## 2021-08-24 ENCOUNTER — Ambulatory Visit (INDEPENDENT_AMBULATORY_CARE_PROVIDER_SITE_OTHER): Payer: BC Managed Care – PPO | Admitting: Family Medicine

## 2021-08-24 ENCOUNTER — Ambulatory Visit: Payer: Self-pay

## 2021-08-24 VITALS — BP 124/82 | HR 88 | Ht 72.0 in | Wt 284.0 lb

## 2021-08-24 DIAGNOSIS — M9902 Segmental and somatic dysfunction of thoracic region: Secondary | ICD-10-CM

## 2021-08-24 DIAGNOSIS — J328 Other chronic sinusitis: Secondary | ICD-10-CM | POA: Diagnosis not present

## 2021-08-24 DIAGNOSIS — M9901 Segmental and somatic dysfunction of cervical region: Secondary | ICD-10-CM | POA: Diagnosis not present

## 2021-08-24 DIAGNOSIS — M9904 Segmental and somatic dysfunction of sacral region: Secondary | ICD-10-CM

## 2021-08-24 DIAGNOSIS — R2231 Localized swelling, mass and lump, right upper limb: Secondary | ICD-10-CM

## 2021-08-24 DIAGNOSIS — M9908 Segmental and somatic dysfunction of rib cage: Secondary | ICD-10-CM

## 2021-08-24 DIAGNOSIS — J329 Chronic sinusitis, unspecified: Secondary | ICD-10-CM | POA: Insufficient documentation

## 2021-08-24 DIAGNOSIS — H9319 Tinnitus, unspecified ear: Secondary | ICD-10-CM

## 2021-08-24 DIAGNOSIS — M9903 Segmental and somatic dysfunction of lumbar region: Secondary | ICD-10-CM

## 2021-08-24 DIAGNOSIS — M79645 Pain in left finger(s): Secondary | ICD-10-CM

## 2021-08-24 NOTE — Assessment & Plan Note (Signed)
Patient has had difficulty with his ear and chronic sinusitis of the maxillary and it appears on the left side.  Will refer to ENT for further evaluation and see if there is anything that could be beneficial.

## 2021-08-24 NOTE — Assessment & Plan Note (Signed)
Appears to be a subcutaneous cyst noted.  Some also has significant calcific aspect.  I do not see any abnormal blood flow coming from it.  I do believe secondary to the size of the calcific aspect would not respond extremely well to aspiration and sent to hand surgery to discuss removal.

## 2021-08-24 NOTE — Patient Instructions (Addendum)
Referral to ENT Referral to Ortho Do Cymbalta 40mg  for 2 weeks then 20mg  2 weeks Send updates See you again in 5-6 weeks

## 2021-08-25 ENCOUNTER — Other Ambulatory Visit (HOSPITAL_COMMUNITY): Payer: Self-pay

## 2021-08-25 DIAGNOSIS — M5136 Other intervertebral disc degeneration, lumbar region: Secondary | ICD-10-CM | POA: Diagnosis not present

## 2021-08-25 DIAGNOSIS — M5135 Other intervertebral disc degeneration, thoracolumbar region: Secondary | ICD-10-CM | POA: Diagnosis not present

## 2021-08-28 ENCOUNTER — Other Ambulatory Visit: Payer: Self-pay | Admitting: Family Medicine

## 2021-08-31 DIAGNOSIS — G4733 Obstructive sleep apnea (adult) (pediatric): Secondary | ICD-10-CM | POA: Diagnosis not present

## 2021-09-05 ENCOUNTER — Telehealth: Payer: Self-pay | Admitting: Primary Care

## 2021-09-05 ENCOUNTER — Ambulatory Visit (INDEPENDENT_AMBULATORY_CARE_PROVIDER_SITE_OTHER): Payer: BC Managed Care – PPO | Admitting: Orthopedic Surgery

## 2021-09-05 ENCOUNTER — Ambulatory Visit: Payer: Self-pay

## 2021-09-05 ENCOUNTER — Ambulatory Visit (INDEPENDENT_AMBULATORY_CARE_PROVIDER_SITE_OTHER): Payer: BC Managed Care – PPO

## 2021-09-05 ENCOUNTER — Other Ambulatory Visit: Payer: Self-pay | Admitting: Orthopedic Surgery

## 2021-09-05 DIAGNOSIS — M25542 Pain in joints of left hand: Secondary | ICD-10-CM | POA: Diagnosis not present

## 2021-09-05 DIAGNOSIS — M25541 Pain in joints of right hand: Secondary | ICD-10-CM

## 2021-09-05 DIAGNOSIS — M25331 Other instability, right wrist: Secondary | ICD-10-CM | POA: Diagnosis not present

## 2021-09-05 DIAGNOSIS — R2231 Localized swelling, mass and lump, right upper limb: Secondary | ICD-10-CM

## 2021-09-05 NOTE — Progress Notes (Unsigned)
@Patient  ID: Frank [name], male    DOB: 06-03-1990, 31 y.o.   MRN: [id number]  No chief complaint on file.   Referring provider: Harrison Mons, PA  HPI:  31 year old male, never smoked. PMH significant for ADD, OCD, daytime sleepiness.   Previous [facility] pulmonary encounter:  05/04/2021 Patient presents today for sleep consult. He had sleep study in 2016 that showed mild OSA, AHI was 5.4. He was never started on treatment. He has symptoms of loud snoring, witness apnea, restless sleep and daytime sleepiness. He has a family hx sleep apnea. Wife states that his oxygen will drop into the 60s. Typical bedtime is between 8-11pm. He takes 50mg  trazodone for insomnia at bedtime, he has prn Ambien which he uses very infrequently. He wakes up multiple times at night, previously thought this was due to his chronic back pain. He starts his day at 4:30 or 6am. No morning grogginess but reports daytime sleepiness in the afternoon. Drinking a lot of caffeine to stay awake. He takes adderall 15mg  as needed mostly when having to drive, he does not use every day. He has been having palpitations and dizziness. Denies narcolepsy, cataplexy or sleep walking.   Sleep questionnaire: Symptoms- Loud snoring, waking up gasping for air, daytime sleepiness  Prior sleep study- Yes, early 38s (results available) Bedtime- 8-11pm Time to fall asleep- minutes to hours  Nocturnal awakenings- too many times  Out of bed/start of day- 4:30 or 6am  Weight changes- Fluctuates  Do you operate heavy machinery- No Do you currently wear CPAP-No  Do you current wear oxygen- No  Epworth - 17  06/22/2021 Patient contacted today for follow-up. Accompanied by wife on virtual video visit. He had a home sleep test on 06/03/21 that showed moderate obstructive sleep apnea, AHI 23.4/hr with SpO2 low 70% (average 90%). Reviewed treatment options. His daytime sleepiness has worsened, he would like to try CPAP. He is no longer taking Ambien  to sleep, remains on trazodone 50mg  at bedtime. He is taking Adderall 15mg  TID as needed for ADD symptoms.  Moderate OSA: - Patient has symptoms of loud snoring, waking up gasping for air and daytime sleepiness. Home sleep study on 06/03/21 >> AHI 23.4/hr with SpO2 low 70%. Reviewed treatment options including weight loss, oral appliance, CPAP therapy or referral to ENT. Due to severe of symptoms and daytime sleepiness recommended starting CPAP therapy, patient is agreement with plan.  DME referral placed for new CPAP start auto titrate 5 to 15 cm H2O with mask of choice. Encourage patient aim to wear CPAP every night min 4-6 hours or longer. Advised against driving if experiencing excessive daytime sleepiness. FU in 8 weeks for compliance check.   Orders: Start CPAP 5-15cm h20 with mask of choice/chin strap    09/06/2021 Patient presents today for 8-week follow-up OSA.  Symptoms of loud snoring, waking up gasping for air and daytime sleepiness.  Home sleep study on 06/03/2021 showed moderate obstructive sleep apnea, AHI 23.4 an hour.  During last visit patient was started on CPAP.  Not wearing CPAP due to URI    No Known Allergies  Immunization History  Administered Date(s) Administered   Influenza,inj,Quad PF,6+ Mos 01/17/2016, 10/13/2016   Influenza,inj,quad, With Preservative 12/03/2017   Meningococcal Polysaccharide 02/13/2009   PFIZER(Purple Top)SARS-COV-2 Vaccination 04/15/2019, 05/16/2019   Tdap 02/13/2009, 12/21/2017    Past Medical History:  Diagnosis Date   ADD (attention deficit disorder)    Anxiety    Narcolepsy through school  Tobacco History: Social History   Tobacco Use  Smoking Status Never  Smokeless Tobacco Never   Counseling given: Not Answered   Outpatient Medications Prior to Visit  Medication Sig Dispense Refill   albuterol (PROVENTIL HFA;VENTOLIN HFA) 108 (90 Base) MCG/ACT inhaler Inhale 2 puffs into the lungs every 4 (four) hours as needed for  wheezing or shortness of breath (cough, shortness of breath or wheezing.). 1 Inhaler 1   albuterol (VENTOLIN HFA) 108 (90 Base) MCG/ACT inhaler Inhale two puffs into the lungs every 4 (four) hours as needed for Wheezing or Shortness of Breath. 6.7 g 1   amphetamine-dextroamphetamine (ADDERALL) 15 MG tablet Take one tablet (15 mg dose) by mouth 2 (two) times daily. May take additional 1/2 tablet daily, as needed for longer days. 75 tablet 0   amphetamine-dextroamphetamine (ADDERALL) 30 MG tablet Take one tablet by mouth 2 times daily. May take additional 1/2 tablet once daily as needed. 75 tablet 0   Azelastine HCl 137 MCG/SPRAY SOLN Place 2 sprays into both nostrils 2 (two) times daily.     benzonatate (TESSALON) 200 MG capsule Take 1 capsule (200 mg total) by mouth 3 (three) times daily as needed for cough. 45 capsule 1   benzonatate (TESSALON) 200 MG capsule Take one capsule (200 mg dose) by mouth 3 (three) times a day as needed. 20 capsule 0   buPROPion (WELLBUTRIN XL) 300 MG 24 hr tablet Take 300 mg by mouth daily.     Cholecalciferol (VITAMIN D3) 1.25 MG (50000 UT) CAPS Take 1 capsule by mouth once a week.     CLOMID 50 MG tablet Take 50 mg by mouth daily.     cyclobenzaprine (FLEXERIL) 5 MG tablet Take 5-10 mg by mouth at bedtime as needed.     DULoxetine (CYMBALTA) 60 MG capsule TAKE 1 CAPSULE BY MOUTH EVERY DAY 90 capsule 0   hydrochlorothiazide (HYDRODIURIL) 25 MG tablet Take 1 tablet (25 mg total) by mouth daily. 90 tablet 3   levothyroxine (SYNTHROID) 25 MCG tablet TAKE 1 TABLET BY MOUTH EVERY DAY 30 tablet 1   losartan (COZAAR) 50 MG tablet Take 1 tablet (50 mg total) by mouth daily. 90 tablet 3   methocarbamol (ROBAXIN) 500 MG tablet Take 1 tablet (500 mg total) by mouth 3 (three) times daily. 90 tablet 0   multivitamin (ONE-A-DAY MEN'S) TABS tablet Take 1 tablet by mouth daily.     oxyCODONE-acetaminophen (PERCOCET) 10-325 MG tablet Take 1 tablet by mouth every 8 (eight) hours as  needed. Take 1 tablet by mouth every 8 (eight) hours as needed.     oxyCODONE-acetaminophen (PERCOCET) 10-325 MG tablet Take one tablet by mouth every 8 (eight) hours as needed for Pain. 90 tablet 0   potassium chloride SA (KLOR-CON M20) 20 MEQ tablet Take 1 tablet (20 mEq total) by mouth 2 (two) times daily. 180 tablet 3   traZODone (DESYREL) 50 MG tablet TAKE 1/2 TO 1 TABLET BY MOUTH AT BEDTIME AS NEEDED FOR SLEEP 90 tablet 2   No facility-administered medications prior to visit.      Review of Systems  Review of Systems   Physical Exam  There were no vitals taken for this visit. Physical Exam   Lab Results:  CBC    Component Value Date/Time   WBC 7.1 06/22/2021 0816   RBC 4.72 06/22/2021 0816   HGB 15.4 06/22/2021 0816   HCT 43.7 06/22/2021 0816   PLT 190.0 06/22/2021 0816   MCV 92.5 06/22/2021 0816  MCH 31.6 04/24/2015 1148   MCHC 35.3 06/22/2021 0816   RDW 13.2 06/22/2021 0816   LYMPHSABS 2.2 06/22/2021 0816   MONOABS 0.7 06/22/2021 0816   EOSABS 0.3 06/22/2021 0816   BASOSABS 0.1 06/22/2021 0816    BMET    Component Value Date/Time   NA 136 06/22/2021 0816   NA 144 01/17/2016 0945   K 3.8 06/22/2021 0816   CL 101 06/22/2021 0816   CO2 26 06/22/2021 0816   GLUCOSE 99 06/22/2021 0816   BUN 19 06/22/2021 0816   BUN 25 (H) 01/17/2016 0945   CREATININE 1.19 06/22/2021 0816   CREATININE 0.92 04/24/2015 1148   CALCIUM 9.4 06/22/2021 0816   GFRNONAA 110 01/17/2016 0945   GFRAA 127 01/17/2016 0945    BNP No results found for: "BNP"  ProBNP No results found for: "PROBNP"  Imaging: XR Wrist Complete Right  Result Date: 09/05/2021 Multiple views of the right wrist taken today reviewed interpreted by me.  They demonstrate no evidence of acute bony injury.  There is no evidence of carpal malalignment with intact Gilula's lines.  No SL diastasis.  No DISI or VISI deformity.   [place] LIMITED JOINT SPACE STRUCTURES UP LEFT(NO LINKED CHARGES)  Result Date:  08/28/2021 Limited muscular skeletal ultrasound was performed and interpreted by Antoine Primas, M Limited ultrasound of patient's thumb shows the patient does have calcific changes within this subcutaneous cyst noted.  No abnormal vascularity but does have some increasing Doppler flow surrounding the area consistent with inflammation.  Appears to not communicate with the joint Impression: Calcific subcutaneous cyst    DG Epidural/Nerve Root  Result Date: 08/18/2021 CLINICAL DATA:  31 year old male with a left L5 lumbar radiculopathy. He has undergone prior left L5 nerve root blocks with excellent success. His most recent injection performed 06/07/2021 offered very good relief. He has recurrent symptoms and presents for repeat nerve root injection. EXAM: EPIDURAL/NERVE ROOT FLUOROSCOPY TIME:  Radiation exposure index: 9.4 mGy reference air kerma PROCEDURE: The procedure, risks, benefits, and alternatives were explained to the patient. Questions regarding the procedure were encouraged and answered. The patient understands and consents to the procedure. RIGHT L5 NERVE ROOT BLOCK AND TRANSFORAMINAL EPIDURAL: A posterior oblique approach was taken to the intervertebral foramen on the right at L5 using a curved 22 gauge spinal needle. Injection of Omnipaque 180 outlined the L5 nerve root and showed good epidural spread. No vascular opacification is seen. Eighty mg of Depo-Medrol mixed with 1.5 mL 1% lidocaine were instilled. The procedure was well-tolerated, and the patient was discharged thirty minutes following the injection in good condition. COMPLICATIONS: None IMPRESSION: Technically successful injection consisting of a right L5 nerve root block and transforaminal epidural. Electronically Signed   By: Malachy Moan M.D.   On: 08/18/2021 11:21     Assessment & Plan:   No problem-specific Assessment & Plan notes found for this encounter.     Glenford Bayley, NP 09/05/2021

## 2021-09-05 NOTE — Progress Notes (Signed)
Office Visit Note   Patient: Frank [name]           Date of Birth: October 31, 1990           MRN: [id number] Visit Date: 09/05/2021              Requested by: [name], DO [address] [place],  [place] [address] PCP: Porfirio Oar, PA   Assessment & Plan: Visit Diagnoses:  1. Pain in thumb joint with movement of left hand   2. Wrist joint instability, right   3. Mass of skin of right thumb     Plan: Patient has a subcutaneous mass at the ulnar aspect of the left thumb around the IP joint.  The mass seems to have grown recently and is occasionally symptomatic for him.  The mass does seem cystic based on its exam findings and the recent ultrasound.  I would like to get an MRI to further evaluate the lesion given the rapid change in size and questionable calcifications noted on the recent ultrasound..  Patient also describes instability of the wrist that occurs with both working out and with daily activities.  He notes that the wrist will "pop out of place."  Based on his exam with a seemingly positive midcarpal shift test, this seems like a carpal instability non dissociative type of issue.  He has done some therapy with his trainer but has never done any formal hand therapy.  I like to refer him to our hand therapist to work on strengthening the wrist stabilizers.  I can see him back once the MRI is completed to discuss the nature of the thumb mass.  Follow-Up Instructions: No follow-ups on file.   Orders:  Orders Placed This Encounter  Procedures   XR Wrist Complete Right   No orders of the defined types were placed in this encounter.     Procedures: No procedures performed   Clinical Data: No additional findings.   Subjective: Chief Complaint  Patient presents with   Right Wrist - Pain   Right Thumb - Pain    This is a 31 year old right-hand-dominant male who presents with multiple issues involving the right hand.  He has a mass at the ulnar aspect of  the right thumb at the dorsal IP joint.  This is noticed about 3 months ago.  He notes that his enlarged in size significantly since first being noticed.  It is occasionally painful when pressure is applied to it but is not painful at rest.  Denies any other lesions, lumps, or bumps in the hand.  He also describes instability in the right wrist.  This note started from lifting weights around a year or so ago.  He was bench pressing when the bar fell causing his wrist to hyperextend.  Since then he has had self-described dislocations of the wrist.  This occurs every 2 or 3 weeks.  He feels a palpable clunk and notices a deformity of the wrist.  The clunk seems to be coming from the dorsal central wrist.  Sounds good was colicky he notes he is able to reduce this himself with finger flexion and rapid flexion of the wrist.  He has been working with a trainer for some significant vertebral issues and is also worked with him on wrist stabilization.  Despite working with his trainer, his wrist is still symptomatic.  He has no pain at rest.      Review of Systems   Objective: Vital  Signs: There were no vitals taken for this visit.  Physical Exam Constitutional:      Appearance: Normal appearance.  Cardiovascular:     Rate and Rhythm: Normal rate.     Pulses: Normal pulses.  Pulmonary:     Effort: Pulmonary effort is normal.  Skin:    General: Skin is warm and dry.     Capillary Refill: Capillary refill takes less than 2 seconds.  Neurological:     Mental Status: He is alert.     Left Hand Exam   Tenderness  Left hand tenderness location: TTP around ulnar dorsal aspect of thumb IP joint in area of lesion.   Range of Motion  The patient has normal left wrist ROM.  Other  Erythema: absent Sensation: normal Pulse: present  Comments:  Approx 1x1 cm mass at ulnar dorsal aspect of thumb around IP joint.  Mass is firm, round, well circumscribed, mobile, and appears to transilluminate.  Full  and painless ROM of IP joint.   Full ROM of wrist.  No "catch up clunk" with radial to ulnar deviation of wrist.  Clunking sensation with volar midcarpal shift test.       Specialty Comments:  No specialty comments available.  Imaging: No results found.   PMFS History: Patient Active Problem List   Diagnosis Date Noted   Wrist joint instability, right 09/05/2021   Mass of skin of right thumb 08/24/2021   Sinusitis, chronic 08/24/2021   Moderate obstructive sleep apnea 06/22/2021   Palpitations 05/13/2021   New onset headache 05/11/2021   Left knee pain 10/14/2020   Toe contusion 09/27/2020   Injury of right index finger 09/27/2020   Acute left lumbar radiculopathy 06/08/2020   Chest wall pain 04/08/2020   Left shoulder pain 12/17/2019   Right wrist pain 04/16/2019   Trigger point of neck 05/14/2018   Trigger point of left shoulder region 10/24/2017   Nonallopathic lesion of cervical region 08/29/2017   Nonallopathic lesion of rib cage 08/29/2017   Low testosterone in male 09/29/2016   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 03/27/2016   Chronic prescription opiate use 01/17/2016   Scheuermann's disease 12/21/2015   Nonallopathic lesion of thoracic region 12/21/2015   Nonallopathic lesion of sacral region 12/21/2015   Nonallopathic lesion of lumbosacral region 12/21/2015   Myofascial pain 10/12/2014   Kyphosis of thoracic region 10/02/2014   Degeneration of intervertebral disc of thoracic region 10/02/2014   Excessive daytime sleepiness 03/04/2014   Patient overweight 03/04/2014   Snoring 03/04/2014   Insomnia 01/30/2012   ADD (attention deficit disorder) 08/23/2011   Acne 08/23/2011   History of OCD (obsessive compulsive disorder) 08/23/2011   BMI 38.0-38.9,adult 08/23/2011   Narcolepsy 07/26/2011   Past Medical History:  Diagnosis Date   ADD (attention deficit disorder)    Anxiety    Narcolepsy through school    Family History  Problem Relation Age of  Onset   Stroke Maternal Grandmother    Cancer Paternal Grandmother        Breast   Hypertension Paternal Grandmother    Diabetes Paternal Grandfather    Prostate cancer Father        and grandfather   Other Neg Hx     Past Surgical History:  Procedure Laterality Date   KNEE SURGERY     WISDOM TOOTH EXTRACTION  2013   Social History   Occupational History    Employer: [employer]  Tobacco Use   Smoking status: Never   Smokeless  tobacco: Never  Substance and Sexual Activity   Alcohol use: Yes    Alcohol/week: 12.0 standard drinks of alcohol    Types: 5 Shots of liquor, 7 Standard drinks or equivalent per week    Comment: Mostly just weekends   Drug use: No   Sexual activity: Yes    Birth control/protection: Condom

## 2021-09-05 NOTE — Telephone Encounter (Signed)
Routing to Miss Clent Ridges so she will be aware.

## 2021-09-06 ENCOUNTER — Encounter: Payer: Self-pay | Admitting: Primary Care

## 2021-09-06 ENCOUNTER — Ambulatory Visit (INDEPENDENT_AMBULATORY_CARE_PROVIDER_SITE_OTHER): Payer: BC Managed Care – PPO | Admitting: Primary Care

## 2021-09-06 ENCOUNTER — Ambulatory Visit (INDEPENDENT_AMBULATORY_CARE_PROVIDER_SITE_OTHER): Payer: BC Managed Care – PPO

## 2021-09-06 ENCOUNTER — Other Ambulatory Visit (HOSPITAL_COMMUNITY): Payer: Self-pay

## 2021-09-06 VITALS — BP 128/76 | HR 96 | Temp 98.7°F | Ht 72.0 in | Wt 287.4 lb

## 2021-09-06 DIAGNOSIS — J209 Acute bronchitis, unspecified: Secondary | ICD-10-CM

## 2021-09-06 DIAGNOSIS — J329 Chronic sinusitis, unspecified: Secondary | ICD-10-CM | POA: Insufficient documentation

## 2021-09-06 DIAGNOSIS — G4733 Obstructive sleep apnea (adult) (pediatric): Secondary | ICD-10-CM

## 2021-09-06 MED ORDER — AMOXICILLIN-POT CLAVULANATE 875-125 MG PO TABS
1.0000 | ORAL_TABLET | Freq: Two times a day (BID) | ORAL | 0 refills | Status: DC
  Filled 2021-09-06: qty 14, 7d supply, fill #0

## 2021-09-06 MED ORDER — PREDNISONE 20 MG PO TABS
ORAL_TABLET | ORAL | 0 refills | Status: DC
  Filled 2021-09-06: qty 15, 10d supply, fill #0

## 2021-09-06 NOTE — Patient Instructions (Addendum)
Nice seeing you today Frank [name], glad CPAP has been working so well for you. Sorry you are not feeling well. See below recommendations, if not better in 7-10 days please let me know   Recommendations: Can stop astelin if not helping  Start flonase nasal  1-2 spray per nostril daily and/ or ocean saline rinse twice a day  Take mucinex-dm 600mg  twice daily Continue to wear CPAP 4-6 hours or longer  Call DME company to get full face mask  Orders: CXR today (ordered) Change CPAP pressure 8-18cm h20  Please provide patient with full face mask  Rx: Augmentin 1 tab twice daily x 7 days Prednisone 40mg  x 5 days; 20mg  x 5 days; then stop   FU: 6 months with Beth NP or sooner if needed

## 2021-09-06 NOTE — Assessment & Plan Note (Addendum)
-   Home sleep study on 06/03/2021 showed moderate obstructive sleep apnea, AHI 23.4 an hour.  Patient was started on CPAP with significant improvement in sleep quality and daytime sleepiness. Current CPAP pressure 5-15cm h20 (13.4cm h20-95%); Residual AHI 1.2/hr. Recommend adjusting CPAP pressure 8-18cm h20. FU in 6 months or sooner if needed.

## 2021-09-06 NOTE — Assessment & Plan Note (Signed)
-   Patient developed URI symptoms 2 weeks ago. He has a productive cough with associated sob and wheezing. CXR today was normal. Sending in RX Augmentin 1 tab twice daily x 7 days and prednisone 40mg  x 5 days; 20mg  x 5 days. Advised he take mucinex-dm 600mg  twice daily and use fluticasone nasal spray daily +ocean saline rinses twice daily. FU if symptoms do not improve/resolve.

## 2021-09-11 ENCOUNTER — Other Ambulatory Visit: Payer: Self-pay | Admitting: Family Medicine

## 2021-09-11 ENCOUNTER — Other Ambulatory Visit (HOSPITAL_COMMUNITY): Payer: Self-pay

## 2021-09-11 MED ORDER — METHOCARBAMOL 500 MG PO TABS
500.0000 mg | ORAL_TABLET | Freq: Three times a day (TID) | ORAL | 0 refills | Status: DC
  Filled 2021-09-11: qty 90, 30d supply, fill #0

## 2021-09-12 ENCOUNTER — Other Ambulatory Visit (HOSPITAL_COMMUNITY): Payer: Self-pay

## 2021-09-12 ENCOUNTER — Encounter: Payer: Self-pay | Admitting: Cardiovascular Disease

## 2021-09-12 MED ORDER — BENZONATATE 200 MG PO CAPS
ORAL_CAPSULE | ORAL | 0 refills | Status: DC
  Filled 2021-09-12: qty 20, 7d supply, fill #0

## 2021-09-12 MED ORDER — AMPHETAMINE-DEXTROAMPHETAMINE 30 MG PO TABS
ORAL_TABLET | ORAL | 0 refills | Status: DC
  Filled 2021-09-12 – 2021-09-14 (×4): qty 75, 30d supply, fill #0

## 2021-09-12 NOTE — Progress Notes (Unsigned)
[name] [address] [place] [address] Phone: [phone] Subjective:   Frank [name], am serving as a scribe for Dr. Antoine Primas.  I'm seeing this patient by the request  of:  Porfirio Oar, PA  CC: Pain follow-up  [id number]  [name] is a 31 y.o. male coming in with complaint of back and neck pain. OMT 08/24/2021. Patient states that he is gaining weight. Increased Cymbalta and splitting up Wellbutrin. Leg pain has caused him to be less active. Has shooting nerve pain with sitting.  Patient is having difficulty still with after his cough.  Has been on prednisone.  Has noticed some increasing weight gain and is wondering what that is potentially from.  Medications patient has been prescribed: Trazadone, Synthroid  Taking:         Reviewed prior external information including notes and imaging from previsou exam, outside providers and external EMR if available.   As well as notes that were available from care everywhere and other healthcare systems.  Past medical history, social, surgical and family history all reviewed in electronic medical record.  No pertanent information unless stated regarding to the chief complaint.   Past Medical History:  Diagnosis Date   ADD (attention deficit disorder)    Anxiety    Narcolepsy through school    No Known Allergies   Review of Systems:  No headache, visual changes, nausea, vomiting, diarrhea, constipation, dizziness, abdominal pain, skin rash, fevers, chills, night sweats, weight loss, swollen lymph nodes, body aches, joint swelling, chest pain, shortness of breath, mood changes. POSITIVE muscle aches  Objective  Blood pressure (!) 120/90, pulse 83, height 6' (1.829 m), weight 298 lb (135.2 kg), SpO2 98 %.   General: No apparent distress alert and oriented x3 mood and affect normal, dressed appropriately.  HEENT: Pupils equal, extraocular movements intact  Respiratory:  Patient's speak in full sentences and does not appear short of breath  Cardiovascular: No lower extremity edema, non tender, no erythema  Gait MSK:  Back low back does have some loss of lordosis.  Significant tightness noted more in the thoracolumbar juncture in the lumbosacral area.  Seems to be bilateral.  Tightness noted in the neck bilaterally.  Does have tightness of the right wrist as well.  Positive Watson sign.  Osteopathic findings  C3 flexed rotated and side bent right C6 flexed rotated and side bent left T3 extended rotated and side bent right inhaled rib T6 extended rotated and side bent left L2 flexed rotated and side bent right Sacrum right on right     Assessment and Plan:  Wrist joint instability, right Seeing hand specialist.  Getting imaging.  Scheuermann's disease Chronic problem with tightness.  Discussed icing regimen and home exercises, discussed which activities to do and which ones to avoid, increase activity slowly otherwise.  Discussed hip abductor strengthening.  Follow-up again in 4 weeks chronic problem with exacerbation.  With patient's most recent weight gain though we did discuss medications and continuing the Cymbalta at the 20 mg with patient's weight gain recently.  Wound likely more secondary to his prednisone use.    Nonallopathic problems  Decision today to treat with OMT was based on Physical Exam  After verbal consent patient was treated with HVLA, ME, FPR techniques in cervical, rib, thoracic, lumbar, and sacral  areas  Patient tolerated the procedure well with improvement in symptoms  Patient given exercises, stretches and lifestyle modifications  See medications in patient  instructions if given  Patient will follow up in 4-8 weeks    The above documentation has been reviewed and is accurate and complete [name], DO          Note: This dictation was prepared with Dragon dictation along with smaller phrase technology.  Any transcriptional errors that result from this process are unintentional.

## 2021-09-12 NOTE — Progress Notes (Signed)
This encounter was created in error - please disregard.

## 2021-09-13 ENCOUNTER — Other Ambulatory Visit (HOSPITAL_COMMUNITY): Payer: Self-pay

## 2021-09-13 ENCOUNTER — Encounter: Payer: BC Managed Care – PPO | Admitting: Cardiovascular Disease

## 2021-09-14 ENCOUNTER — Other Ambulatory Visit (HOSPITAL_COMMUNITY): Payer: Self-pay

## 2021-09-14 ENCOUNTER — Ambulatory Visit (INDEPENDENT_AMBULATORY_CARE_PROVIDER_SITE_OTHER): Payer: BC Managed Care – PPO | Admitting: Family Medicine

## 2021-09-14 ENCOUNTER — Telehealth: Payer: Self-pay | Admitting: Orthopedic Surgery

## 2021-09-14 VITALS — BP 120/90 | HR 83 | Ht 72.0 in | Wt 298.0 lb

## 2021-09-14 DIAGNOSIS — M9901 Segmental and somatic dysfunction of cervical region: Secondary | ICD-10-CM | POA: Diagnosis not present

## 2021-09-14 DIAGNOSIS — M9908 Segmental and somatic dysfunction of rib cage: Secondary | ICD-10-CM

## 2021-09-14 DIAGNOSIS — M9903 Segmental and somatic dysfunction of lumbar region: Secondary | ICD-10-CM

## 2021-09-14 DIAGNOSIS — M4200 Juvenile osteochondrosis of spine, site unspecified: Secondary | ICD-10-CM

## 2021-09-14 DIAGNOSIS — M25331 Other instability, right wrist: Secondary | ICD-10-CM | POA: Diagnosis not present

## 2021-09-14 DIAGNOSIS — M9904 Segmental and somatic dysfunction of sacral region: Secondary | ICD-10-CM

## 2021-09-14 DIAGNOSIS — M9902 Segmental and somatic dysfunction of thoracic region: Secondary | ICD-10-CM

## 2021-09-14 MED ORDER — DULOXETINE HCL 20 MG PO CPEP: 20.0000 mg | ORAL_CAPSULE | Freq: Every day | ORAL | 3 refills | Status: DC

## 2021-09-14 NOTE — Assessment & Plan Note (Signed)
Seeing hand specialist.  Getting imaging.

## 2021-09-14 NOTE — Telephone Encounter (Signed)
Patient would like to know if Dr. Frazier Butt wanted his wrist added to the MRI or wait? His call back number is [phone]

## 2021-09-14 NOTE — Patient Instructions (Signed)
Good to see you  I think the prednisone is causing the weight gain  Get back in the gym  No other titer See me again in 4 weeks

## 2021-09-14 NOTE — Assessment & Plan Note (Signed)
Chronic problem with tightness.  Discussed icing regimen and home exercises, discussed which activities to do and which ones to avoid, increase activity slowly otherwise.  Discussed hip abductor strengthening.  Follow-up again in 4 weeks chronic problem with exacerbation.  With patient's most recent weight gain though we did discuss medications and continuing the Cymbalta at the 20 mg with patient's weight gain recently.  Wound likely more secondary to his prednisone use.

## 2021-09-15 ENCOUNTER — Other Ambulatory Visit: Payer: Self-pay | Admitting: Orthopedic Surgery

## 2021-09-15 DIAGNOSIS — M25331 Other instability, right wrist: Secondary | ICD-10-CM

## 2021-09-15 NOTE — Progress Notes (Signed)
Reviewed and agree with assessment/plan.   Kaylib Furness, MD [facility] 09/15/2021, 9:53 AM Pager:  [phone]  

## 2021-09-18 ENCOUNTER — Ambulatory Visit
Admission: RE | Admit: 2021-09-18 | Discharge: 2021-09-18 | Disposition: A | Discharge status: Home / Self Care | Payer: BC Managed Care – PPO | Source: Ambulatory Visit | Attending: Orthopedic Surgery | Admitting: Orthopedic Surgery

## 2021-09-18 DIAGNOSIS — R2231 Localized swelling, mass and lump, right upper limb: Secondary | ICD-10-CM

## 2021-09-18 DIAGNOSIS — M85641 Other cyst of bone, right hand: Secondary | ICD-10-CM | POA: Diagnosis not present

## 2021-09-18 DIAGNOSIS — M67441 Ganglion, right hand: Secondary | ICD-10-CM | POA: Diagnosis not present

## 2021-09-18 MED ORDER — GADOBENATE DIMEGLUMINE 529 MG/ML IV SOLN
20.0000 mL | Freq: Once | INTRAVENOUS | Status: AC | PRN
  Administered 2021-09-18: 20 mL via INTRAVENOUS

## 2021-09-20 NOTE — Telephone Encounter (Signed)
Beth, please advise on pts email regarding his symptoms and abx. Thanks.

## 2021-09-21 ENCOUNTER — Ambulatory Visit: Payer: BC Managed Care – PPO | Admitting: Occupational Therapy

## 2021-09-21 MED ORDER — AMOXICILLIN-POT CLAVULANATE 875-125 MG PO TABS: 1.0000 | ORAL_TABLET | Freq: Two times a day (BID) | ORAL | 0 refills | Status: DC

## 2021-09-21 NOTE — Telephone Encounter (Signed)
We can do another 5 days of Augmentin 1 tab twice daily. Cont mucinex, ocean saline rinses twice a day and flonase   Please refill abx

## 2021-09-26 ENCOUNTER — Ambulatory Visit: Payer: BC Managed Care – PPO | Admitting: Orthopedic Surgery

## 2021-09-29 ENCOUNTER — Other Ambulatory Visit: Payer: Self-pay | Admitting: Family Medicine

## 2021-09-29 NOTE — Progress Notes (Signed)
[name] [address] [place] [address] Phone: [phone] Subjective:   Frank [name], am serving as a scribe for Dr. Antoine Primas.  I'm seeing this patient by the request  of:  Porfirio Oar, PA  CC: Low back pain follow-up  [id number]  Frank [name] is a 31 y.o. male coming in with complaint of back and neck pain. OMT 09/14/2021. Patient states that he feels like he is having muscular spasm in L side of his ribs. Pain worse when he takes a step with L leg. Pain radiates down into the L leg.   Will be having surgery on thumb with Dr. Frazier Butt.   Starts PT for wrist.   Medications patient has been prescribed: Trazadone, Robaxin, Synthroid  Taking:         Reviewed prior external information including notes and imaging from previsou exam, outside providers and external EMR if available.   As well as notes that were available from care everywhere and other healthcare systems.  Past medical history, social, surgical and family history all reviewed in electronic medical record.  No pertanent information unless stated regarding to the chief complaint.   Past Medical History:  Diagnosis Date   ADD (attention deficit disorder)    Anxiety    Narcolepsy through school    No Known Allergies   Review of Systems:  No headache, visual changes, nausea, vomiting, diarrhea, constipation, dizziness, abdominal pain, skin rash, fevers, chills, night sweats, weight loss, swollen lymph nodes, body aches, joint swelling, chest pain, shortness of breath, mood changes. POSITIVE muscle aches  Objective  Blood pressure 118/88, pulse 86, height 6' (1.829 m), weight 289 lb (131.1 kg), SpO2 98 %.   General: No apparent distress alert and oriented x3 mood and affect normal, dressed appropriately.  HEENT: Pupils equal, extraocular movements intact  Respiratory: Patient's speak in full sentences and does not appear short of breath   Cardiovascular: No lower extremity edema, non tender, no erythema  Gait guarded  MSK:  Back does have loss of lordosis.  No tenderness to palpation noted.  Patient does have a muscle spasm noted of the larger postural muscles on the left side  Osteopathic findings  C2 flexed rotated and side bent right C6 flexed rotated and side bent left T7 extended rotated and side bent left inhaled rib T9 extended rotated and side bent left L2 flexed rotated and side bent left Sacrum right on right    Assessment and Plan:  Acute left lumbar radiculopathy Chronic problem with exacerbation and worsening symptoms at the moment.  Positive straight leg test with muscle soreness and spasm noted.  Toradol and Depo-Medrol given today.  Methocarbamol refilled.  Duloxetine still patient is taking.  May need to consider another injection.  Follow-up again in 4 to 6 weeks    Nonallopathic problems  Decision today to treat with OMT was based on Physical Exam  After verbal consent patient was treated with HVLA, ME, FPR techniques in cervical, rib, thoracic, lumbar, and sacral  areas  Patient tolerated the procedure well with improvement in symptoms  Patient given exercises, stretches and lifestyle modifications  See medications in patient instructions if given  Patient will follow up in 4-8 weeks    The above documentation has been reviewed and is accurate and complete [name], DO          Note: This dictation was prepared with Dragon dictation along with smaller phrase technology. Any transcriptional errors  that result from this process are unintentional.

## 2021-09-30 ENCOUNTER — Ambulatory Visit
Admission: RE | Admit: 2021-09-30 | Discharge: 2021-09-30 | Disposition: A | Discharge status: Home / Self Care | Payer: BC Managed Care – PPO | Source: Ambulatory Visit | Attending: Orthopedic Surgery | Admitting: Orthopedic Surgery

## 2021-09-30 ENCOUNTER — Other Ambulatory Visit: Payer: Self-pay | Admitting: Orthopedic Surgery

## 2021-09-30 DIAGNOSIS — M67431 Ganglion, right wrist: Secondary | ICD-10-CM | POA: Diagnosis not present

## 2021-09-30 DIAGNOSIS — M25331 Other instability, right wrist: Secondary | ICD-10-CM

## 2021-09-30 DIAGNOSIS — R5381 Other malaise: Secondary | ICD-10-CM | POA: Diagnosis not present

## 2021-09-30 DIAGNOSIS — R531 Weakness: Secondary | ICD-10-CM | POA: Diagnosis not present

## 2021-10-01 DIAGNOSIS — G4733 Obstructive sleep apnea (adult) (pediatric): Secondary | ICD-10-CM | POA: Diagnosis not present

## 2021-10-03 ENCOUNTER — Ambulatory Visit: Payer: BC Managed Care – PPO | Admitting: Orthopedic Surgery

## 2021-10-04 ENCOUNTER — Ambulatory Visit (INDEPENDENT_AMBULATORY_CARE_PROVIDER_SITE_OTHER): Payer: BC Managed Care – PPO | Admitting: Orthopedic Surgery

## 2021-10-04 DIAGNOSIS — M25331 Other instability, right wrist: Secondary | ICD-10-CM | POA: Diagnosis not present

## 2021-10-04 DIAGNOSIS — R2231 Localized swelling, mass and lump, right upper limb: Secondary | ICD-10-CM

## 2021-10-04 NOTE — Progress Notes (Signed)
Office Visit Note   Patient: Frank [name]           Date of Birth: 10/08/1990           MRN: [id number] Visit Date: 10/04/2021              Requested by: Porfirio Oar, PA [address] [place],  [place] [address] PCP: Porfirio Oar, PA   Assessment & Plan: Visit Diagnoses:  1. Mass of skin of right thumb   2. Wrist joint instability, right     Plan: We reviewed patient's MRI of his right hand and wrist.  The MRI suggests a ganglion cyst at the ulnar aspect of the thumb which is consistent with his clinical exam.  The MRI of his wrist is unremarkable without any evidence of ligamentous injury or explanation for his wrist instability.  He has been connected with hand therapy to start work on strengthening of the wrist to hopefully improve his subjective instability.  He would like to proceed with surgical excision of the right thumb mass.  We reviewed the risks of surgery including bleeding, infection, damage to neurovascular structures, mass recurrence, incomplete symptom relief, and need for additional procedures.  Patient would like to proceed with surgery.  A surgical date and time will be confirmed with the patient.  Follow-Up Instructions: No follow-ups on file.   Orders:  No orders of the defined types were placed in this encounter.  No orders of the defined types were placed in this encounter.     Procedures: No procedures performed   Clinical Data: No additional findings.   Subjective: Chief Complaint  Patient presents with   Right Thumb - Follow-up    MRI Review    This is a 31 year old right-hand-dominant male who presents for MRI follow-up of the right hand and wrist.  He has a mass in the ulnar aspect of the right thumb at the dorsal aspect of the IP joint.  This has been present for around 4 months or so.  It has enlarged in size.  It is painful when pressure is applied to it but no pain at rest.  He also complains of continued  instability in the right wrist.  MRI of the hand and wrist were notable for a benign ganglion cyst at the thumb IP joint and an unremarkable wrist MRI with no evidence of ligamentous injury or carpal instability.    Review of Systems   Objective: Vital Signs: There were no vitals taken for this visit.  Physical Exam Constitutional:      Appearance: Normal appearance.  Cardiovascular:     Rate and Rhythm: Normal rate.     Pulses: Normal pulses.  Pulmonary:     Effort: Pulmonary effort is normal.  Skin:    General: Skin is warm and dry.     Capillary Refill: Capillary refill takes less than 2 seconds.  Neurological:     Mental Status: He is alert.     Right Hand Exam   Tenderness  Right hand tenderness location: TTP directly over thumb mass at ulnar aspect of IP joint.  Other  Erythema: absent Sensation: normal Pulse: present  Comments:  Approx 1x1 cm mass at mid axial aspect of right thumb on ulnar side.  Mass is firm, round, mobile, and well circumscribed.  Full and painless wrist ROM today without instability.       Specialty Comments:  No specialty comments available.  Imaging: No results found.  PMFS History: Patient Active Problem List   Diagnosis Date Noted   Acute bronchitis 09/06/2021   Wrist joint instability, right 09/05/2021   Mass of skin of right thumb 08/24/2021   Sinusitis, chronic 08/24/2021   Moderate obstructive sleep apnea 06/22/2021   Palpitations 05/13/2021   New onset headache 05/11/2021   Left knee pain 10/14/2020   Toe contusion 09/27/2020   Injury of right index finger 09/27/2020   Acute left lumbar radiculopathy 06/08/2020   Chest wall pain 04/08/2020   Left shoulder pain 12/17/2019   Right wrist pain 04/16/2019   Trigger point of neck 05/14/2018   Trigger point of left shoulder region 10/24/2017   Nonallopathic lesion of cervical region 08/29/2017   Nonallopathic lesion of rib cage 08/29/2017   Low testosterone in male  09/29/2016   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 03/27/2016   Chronic prescription opiate use 01/17/2016   Scheuermann's disease 12/21/2015   Nonallopathic lesion of thoracic region 12/21/2015   Nonallopathic lesion of sacral region 12/21/2015   Nonallopathic lesion of lumbosacral region 12/21/2015   Myofascial pain 10/12/2014   Kyphosis of thoracic region 10/02/2014   Degeneration of intervertebral disc of thoracic region 10/02/2014   Excessive daytime sleepiness 03/04/2014   Patient overweight 03/04/2014   Snoring 03/04/2014   Insomnia 01/30/2012   ADD (attention deficit disorder) 08/23/2011   Acne 08/23/2011   History of OCD (obsessive compulsive disorder) 08/23/2011   BMI 38.0-38.9,adult 08/23/2011   Narcolepsy 07/26/2011   Past Medical History:  Diagnosis Date   ADD (attention deficit disorder)    Anxiety    Narcolepsy through school    Family History  Problem Relation Age of Onset   Stroke Maternal Grandmother    Cancer Paternal Grandmother        Breast   Hypertension Paternal Grandmother    Diabetes Paternal Grandfather    Prostate cancer Father        and grandfather   Other Neg Hx     Past Surgical History:  Procedure Laterality Date   KNEE SURGERY     WISDOM TOOTH EXTRACTION  2013   Social History   Occupational History    Employer: [employer]  Tobacco Use   Smoking status: Never   Smokeless tobacco: Never  Substance and Sexual Activity   Alcohol use: Yes    Alcohol/week: 12.0 standard drinks of alcohol    Types: 5 Shots of liquor, 7 Standard drinks or equivalent per week    Comment: Mostly just weekends   Drug use: No   Sexual activity: Yes    Birth control/protection: Condom

## 2021-10-05 ENCOUNTER — Other Ambulatory Visit: Payer: Self-pay

## 2021-10-05 ENCOUNTER — Ambulatory Visit (INDEPENDENT_AMBULATORY_CARE_PROVIDER_SITE_OTHER): Payer: BC Managed Care – PPO | Admitting: Family Medicine

## 2021-10-05 ENCOUNTER — Telehealth: Payer: Self-pay | Admitting: Family Medicine

## 2021-10-05 ENCOUNTER — Ambulatory Visit: Payer: BC Managed Care – PPO | Admitting: Occupational Therapy

## 2021-10-05 VITALS — BP 118/88 | HR 86 | Ht 72.0 in | Wt 289.0 lb

## 2021-10-05 DIAGNOSIS — M9903 Segmental and somatic dysfunction of lumbar region: Secondary | ICD-10-CM

## 2021-10-05 DIAGNOSIS — M9908 Segmental and somatic dysfunction of rib cage: Secondary | ICD-10-CM | POA: Diagnosis not present

## 2021-10-05 DIAGNOSIS — M9901 Segmental and somatic dysfunction of cervical region: Secondary | ICD-10-CM

## 2021-10-05 DIAGNOSIS — M5416 Radiculopathy, lumbar region: Secondary | ICD-10-CM

## 2021-10-05 DIAGNOSIS — M9902 Segmental and somatic dysfunction of thoracic region: Secondary | ICD-10-CM

## 2021-10-05 DIAGNOSIS — M9904 Segmental and somatic dysfunction of sacral region: Secondary | ICD-10-CM | POA: Diagnosis not present

## 2021-10-05 DIAGNOSIS — M25531 Pain in right wrist: Secondary | ICD-10-CM

## 2021-10-05 MED ORDER — METHYLPREDNISOLONE ACETATE 80 MG/ML IJ SUSP
80.0000 mg | Freq: Once | INTRAMUSCULAR | Status: AC
  Administered 2021-10-05: 80 mg via INTRAMUSCULAR

## 2021-10-05 MED ORDER — KETOROLAC TROMETHAMINE 60 MG/2ML IM SOLN
60.0000 mg | Freq: Once | INTRAMUSCULAR | Status: AC
  Administered 2021-10-05: 60 mg via INTRAMUSCULAR

## 2021-10-05 NOTE — Telephone Encounter (Signed)
Patient called asking for a new referral to be sent for a PT hand specialist. The one he went to today was $370 and he did not want to spend that.

## 2021-10-05 NOTE — Assessment & Plan Note (Signed)
Chronic problem with exacerbation and worsening symptoms at the moment.  Positive straight leg test with muscle soreness and spasm noted.  Toradol and Depo-Medrol given today.  Methocarbamol refilled.  Duloxetine still patient is taking.  May need to consider another injection.  Follow-up again in 4 to 6 weeks

## 2021-10-05 NOTE — Telephone Encounter (Signed)
Referral sent to [facility] for OT for patient.

## 2021-10-05 NOTE — Patient Instructions (Signed)
Injections today in backside See me in 4-5 weeks Take muscle relaxer regularly

## 2021-10-10 ENCOUNTER — Other Ambulatory Visit (HOSPITAL_COMMUNITY): Payer: Self-pay

## 2021-10-10 MED ORDER — CLOMIPHENE CITRATE 50 MG PO TABS
25.0000 mg | ORAL_TABLET | ORAL | 0 refills | Status: DC
  Filled 2021-10-10: qty 22, 88d supply, fill #0
  Filled 2022-06-04 – 2022-06-16 (×2): qty 8, 32d supply, fill #1

## 2021-10-10 MED ORDER — VITAMIN D3 1.25 MG (50000 UT) PO CAPS
ORAL_CAPSULE | ORAL | 3 refills | Status: DC
  Filled 2021-10-10: qty 12, 84d supply, fill #0
  Filled 2021-11-04: qty 12, 84d supply, fill #1

## 2021-10-10 MED ORDER — OXYCODONE HCL 10 MG PO TABS
ORAL_TABLET | ORAL | 0 refills | Status: DC
  Filled 2021-10-10: qty 90, 30d supply, fill #0

## 2021-10-10 MED ORDER — LEVOTHYROXINE SODIUM 25 MCG PO TABS
ORAL_TABLET | ORAL | 0 refills | Status: DC
  Filled 2021-10-10: qty 30, 30d supply, fill #0

## 2021-10-10 MED ORDER — BUPROPION HCL ER (XL) 300 MG PO TB24
ORAL_TABLET | ORAL | 3 refills | Status: DC
  Filled 2021-10-10: qty 90, 90d supply, fill #0
  Filled 2021-12-06: qty 90, 90d supply, fill #1

## 2021-10-10 MED ORDER — ALBUTEROL SULFATE HFA 108 (90 BASE) MCG/ACT IN AERS
INHALATION_SPRAY | RESPIRATORY_TRACT | 1 refills | Status: DC
Start: 2021-10-10 — End: 2021-11-04
  Filled 2021-10-10: qty 6.7, 16d supply, fill #0
  Filled 2021-10-26: qty 6.7, 16d supply, fill #1

## 2021-10-10 MED ORDER — BENZONATATE 200 MG PO CAPS
ORAL_CAPSULE | ORAL | 0 refills | Status: DC
  Filled 2021-10-10: qty 20, 6d supply, fill #0

## 2021-10-12 ENCOUNTER — Other Ambulatory Visit (HOSPITAL_COMMUNITY): Payer: Self-pay

## 2021-10-12 MED ORDER — AMPHETAMINE-DEXTROAMPHETAMINE 30 MG PO TABS
ORAL_TABLET | ORAL | 0 refills | Status: DC
  Filled 2021-10-12: qty 75, 30d supply, fill #0

## 2021-10-13 ENCOUNTER — Other Ambulatory Visit: Payer: Self-pay | Admitting: Family Medicine

## 2021-10-13 ENCOUNTER — Other Ambulatory Visit (HOSPITAL_COMMUNITY): Payer: Self-pay

## 2021-10-15 ENCOUNTER — Other Ambulatory Visit (HOSPITAL_COMMUNITY): Payer: Self-pay

## 2021-10-18 DIAGNOSIS — R0609 Other forms of dyspnea: Secondary | ICD-10-CM | POA: Diagnosis not present

## 2021-10-18 DIAGNOSIS — R059 Cough, unspecified: Secondary | ICD-10-CM | POA: Diagnosis not present

## 2021-10-18 DIAGNOSIS — J029 Acute pharyngitis, unspecified: Secondary | ICD-10-CM | POA: Diagnosis not present

## 2021-10-19 NOTE — Progress Notes (Signed)
[name] [address] [place] [address] Phone: [phone] Subjective:   Frank [name], am serving as a scribe for Dr. Antoine Primas.  I'm seeing this patient by the request  of:  Porfirio Oar, PA  CC: Low back pain, wrist pain  [id number]  Frank [name] is a 31 y.o. male coming in with complaint of back and neck pain. OMT 10/05/2021. Patient states that he is doing a lot better after injections in backside last visit.  Patient has felt much better overall.  Still has a lot of tightness he states overall.  He is going to be traveling and is concerned that this can contribute to some of his pain.  Continuing the medications  Medications patient has been prescribed: Cymbalta, Synthroid, Trazadone  Taking: Yes         Reviewed prior external information including notes and imaging from previsou exam, outside providers and external EMR if available.   As well as notes that were available from care everywhere and other healthcare systems.  Past medical history, social, surgical and family history all reviewed in electronic medical record.  No pertanent information unless stated regarding to the chief complaint.   Past Medical History:  Diagnosis Date   ADD (attention deficit disorder)    Anxiety    Narcolepsy through school    No Known Allergies   Review of Systems:  No headache, visual changes, nausea, vomiting, diarrhea, constipation, dizziness, abdominal pain, skin rash, fevers, chills, night sweats, weight loss, swollen lymph nodes joint swelling, chest pain, shortness of breath, mood changes. POSITIVE muscle aches, body aches  Objective  Blood pressure 116/82, pulse 78, height 6' (1.829 m), SpO2 95 %.   General: No apparent distress alert and oriented x3 mood and affect normal, dressed appropriately.  HEENT: Pupils equal, extraocular movements intact  Respiratory: Patient's speak in full sentences and does not appear  short of breath  Cardiovascular: No lower extremity edema, non tender, no erythema  MSK:  Back does have some loss of lordosis.  Some tenderness to palpation of the paraspinal musculature.  Patient does have tightness with FABER test noted.  Right wrist exam does have pain over the lunate area.  Osteopathic findings  C2 flexed rotated and side bent right C6 flexed rotated and side bent left T3 extended rotated and side bent right inhaled rib T8 extended rotated and side bent left L2 flexed rotated and side bent right Sacrum right on right       Assessment and Plan:  Scheuermann's disease Patient likely made improvement after the injection again.  He feels like he has made improvement overall.  Still working on core strengthening.  Does want to consider titrating down on medications but not yet.  Follow-up again in 6 to 8 weeks  Right wrist pain Brace given today.  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    Nonallopathic problems  Decision today to treat with OMT was based on Physical Exam  After verbal consent patient was treated with HVLA, ME, FPR techniques in cervical, rib, thoracic, lumbar, and sacral  areas  Patient tolerated the procedure well with improvement in symptoms  Patient given exercises, stretches and lifestyle modifications  See medications in patient instructions if given  Patient will follow up in 4-8 weeks    The above documentation has been reviewed and is accurate and complete Frank Saa, DO  Note: This dictation was prepared with Dragon dictation along with smaller phrase technology. Any transcriptional errors that result from this process are unintentional.

## 2021-10-20 ENCOUNTER — Encounter: Payer: Self-pay | Admitting: Family Medicine

## 2021-10-20 ENCOUNTER — Ambulatory Visit (INDEPENDENT_AMBULATORY_CARE_PROVIDER_SITE_OTHER): Payer: BC Managed Care – PPO | Admitting: Family Medicine

## 2021-10-20 VITALS — BP 116/82 | HR 78 | Ht 72.0 in

## 2021-10-20 DIAGNOSIS — M25531 Pain in right wrist: Secondary | ICD-10-CM | POA: Diagnosis not present

## 2021-10-20 DIAGNOSIS — M4200 Juvenile osteochondrosis of spine, site unspecified: Secondary | ICD-10-CM

## 2021-10-20 DIAGNOSIS — M9904 Segmental and somatic dysfunction of sacral region: Secondary | ICD-10-CM

## 2021-10-20 DIAGNOSIS — M9908 Segmental and somatic dysfunction of rib cage: Secondary | ICD-10-CM

## 2021-10-20 DIAGNOSIS — M9903 Segmental and somatic dysfunction of lumbar region: Secondary | ICD-10-CM

## 2021-10-20 DIAGNOSIS — M9901 Segmental and somatic dysfunction of cervical region: Secondary | ICD-10-CM | POA: Diagnosis not present

## 2021-10-20 DIAGNOSIS — M9902 Segmental and somatic dysfunction of thoracic region: Secondary | ICD-10-CM

## 2021-10-20 NOTE — Patient Instructions (Signed)
Heel lifts Wrist brace Have fun on your trip Claritin for kids

## 2021-10-20 NOTE — Assessment & Plan Note (Signed)
Patient likely made improvement after the injection again.  He feels like he has made improvement overall.  Still working on core strengthening.  Does want to consider titrating down on medications but not yet.  Follow-up again in 6 to 8 weeks

## 2021-10-20 NOTE — Assessment & Plan Note (Signed)
Brace given today.  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

## 2021-10-24 ENCOUNTER — Other Ambulatory Visit (HOSPITAL_COMMUNITY): Payer: Self-pay

## 2021-10-24 ENCOUNTER — Ambulatory Visit: Payer: BC Managed Care – PPO | Admitting: Family Medicine

## 2021-10-26 ENCOUNTER — Other Ambulatory Visit (HOSPITAL_COMMUNITY): Payer: Self-pay

## 2021-10-27 ENCOUNTER — Other Ambulatory Visit (HOSPITAL_COMMUNITY): Payer: Self-pay

## 2021-10-27 MED ORDER — BENZONATATE 200 MG PO CAPS
200.0000 mg | ORAL_CAPSULE | Freq: Three times a day (TID) | ORAL | 0 refills | Status: DC | PRN
Start: 2021-10-27 — End: 2021-12-06
  Filled 2021-10-27: qty 20, 7d supply, fill #0

## 2021-10-28 ENCOUNTER — Other Ambulatory Visit (HOSPITAL_COMMUNITY): Payer: Self-pay

## 2021-11-01 DIAGNOSIS — G4733 Obstructive sleep apnea (adult) (pediatric): Secondary | ICD-10-CM | POA: Diagnosis not present

## 2021-11-02 ENCOUNTER — Other Ambulatory Visit (HOSPITAL_COMMUNITY): Payer: Self-pay

## 2021-11-02 ENCOUNTER — Other Ambulatory Visit: Payer: Self-pay | Admitting: Family Medicine

## 2021-11-04 ENCOUNTER — Other Ambulatory Visit (HOSPITAL_COMMUNITY): Payer: Self-pay

## 2021-11-04 ENCOUNTER — Other Ambulatory Visit: Payer: Self-pay | Admitting: Family Medicine

## 2021-11-04 MED ORDER — METHOCARBAMOL 500 MG PO TABS
500.0000 mg | ORAL_TABLET | Freq: Three times a day (TID) | ORAL | 0 refills | Status: DC
  Filled 2021-11-04: qty 90, 30d supply, fill #0

## 2021-11-04 MED ORDER — ALBUTEROL SULFATE HFA 108 (90 BASE) MCG/ACT IN AERS
2.0000 | INHALATION_SPRAY | RESPIRATORY_TRACT | 1 refills | Status: DC | PRN
  Filled 2021-11-04 – 2021-11-10 (×2): qty 6.7, 17d supply, fill #0
  Filled 2021-12-06: qty 6.7, 17d supply, fill #1

## 2021-11-04 MED ORDER — LEVOTHYROXINE SODIUM 25 MCG PO TABS
25.0000 ug | ORAL_TABLET | Freq: Every day | ORAL | 0 refills | Status: DC
  Filled 2021-11-04 – 2022-02-04 (×5): qty 30, 30d supply, fill #0

## 2021-11-05 ENCOUNTER — Other Ambulatory Visit (HOSPITAL_COMMUNITY): Payer: Self-pay

## 2021-11-05 MED ORDER — OXYCODONE HCL 10 MG PO TABS
5.0000 mg | ORAL_TABLET | Freq: Three times a day (TID) | ORAL | 0 refills | Status: DC | PRN
  Filled 2021-11-10: qty 90, 30d supply, fill #0

## 2021-11-05 MED ORDER — AMPHETAMINE-DEXTROAMPHETAMINE 30 MG PO TABS
ORAL_TABLET | ORAL | 0 refills | Status: DC
  Filled 2021-11-10: qty 75, 30d supply, fill #0

## 2021-11-09 NOTE — Progress Notes (Unsigned)
[place] Phone: [phone] Subjective:   Frank [name], am serving as a scribe for Dr. Hulan Saas.  I'm seeing this patient by the request  of:  Frank Mons, PA  CC: Back and neck pain follow-up  [id number]  Frank [name] is a 31 y.o. male coming in with complaint of back and neck pain. OMT 10/20/2021. Patient states that he did well on his hunting trip. Back pain has increased in L side of ribs. Would like epidural as it has been a while and he would like to stay ahead.  Thumb pain has gotten worse and he is trying to get in with Dr. Tempie Donning. Wrist pain is improving with wrist brace.  Patient has the thumb mass and is still awaiting surgery.  Medications patient has been prescribed: Robaxin, Cymbalta, Synthroid, Trazadone  Taking:         Reviewed prior external information including notes and imaging from previsou exam, outside providers and external EMR if available.   As well as notes that were available from care everywhere and other healthcare systems.  Past medical history, social, surgical and family history all reviewed in electronic medical record.  [name] pertanent information unless stated regarding to the chief complaint.   Past Medical History:  Diagnosis Date   ADD (attention deficit disorder)    Anxiety    Narcolepsy through school    [name] Known Allergies   Review of Systems:  [name] headache, visual changes, nausea, vomiting, diarrhea, constipation, dizziness, abdominal pain, skin rash, fevers, chills, night sweats, weight loss, swollen lymph nodes, body aches, joint swelling, chest pain, shortness of breath, mood changes. POSITIVE muscle aches  Objective  Blood pressure (!) 120/98, pulse 67, height 6' (1.829 m), weight 289 lb (131.1 kg), SpO2 98 %.   General: [name] apparent distress alert and oriented x3 mood and affect normal, dressed appropriately.  HEENT: Pupils equal,  extraocular movements intact  Respiratory: Patient's speak in full sentences and does not appear short of breath  Cardiovascular: [name] lower extremity edema, non tender, [name] erythema  Lobe has significant increase in tightness noted around the T7-L1 on the left side.  This is different than usual for patient.  Patient is having some table  Osteopathic findings  C2 flexed rotated and side bent right C5 flexed rotated and side bent left T9 extended rotated and side bent left inhaled rib T11 flexed rotated and side bent left L1 flexed rotated and side bent left Sacrum right on right       Assessment and Plan:  Acute left lumbar radiculopathy Continues to have intermittent radicular symptoms on the left leg.  Attempted osteopathic manipulation again. Discussed with patient about icing regimen and home exercises.he continues to take different medications.  Follow-up again in 6 to 8 weeks.    Nonallopathic problems  Decision today to treat with OMT was based on Physical Exam  After verbal consent patient was treated with HVLA, ME, FPR techniques in cervical, rib, thoracic, lumbar, and sacral  areas  Patient tolerated the procedure well with improvement in symptoms  Patient given exercises, stretches and lifestyle modifications  See medications in patient instructions if given  Patient will follow up in 4-8 weeks      The above documentation has been reviewed and is accurate and complete Frank Pulley, DO        Note: This dictation was prepared with Dragon dictation along with smaller phrase technology.  Any transcriptional errors that result from this process are unintentional.

## 2021-11-10 ENCOUNTER — Ambulatory Visit (INDEPENDENT_AMBULATORY_CARE_PROVIDER_SITE_OTHER): Payer: BC Managed Care – PPO | Admitting: Family Medicine

## 2021-11-10 ENCOUNTER — Other Ambulatory Visit (HOSPITAL_COMMUNITY): Payer: Self-pay

## 2021-11-10 VITALS — BP 120/98 | HR 67 | Ht 72.0 in | Wt 289.0 lb

## 2021-11-10 DIAGNOSIS — M5416 Radiculopathy, lumbar region: Secondary | ICD-10-CM

## 2021-11-10 DIAGNOSIS — M9901 Segmental and somatic dysfunction of cervical region: Secondary | ICD-10-CM | POA: Diagnosis not present

## 2021-11-10 DIAGNOSIS — M9903 Segmental and somatic dysfunction of lumbar region: Secondary | ICD-10-CM | POA: Diagnosis not present

## 2021-11-10 DIAGNOSIS — M9902 Segmental and somatic dysfunction of thoracic region: Secondary | ICD-10-CM

## 2021-11-10 DIAGNOSIS — M9908 Segmental and somatic dysfunction of rib cage: Secondary | ICD-10-CM

## 2021-11-10 DIAGNOSIS — M9904 Segmental and somatic dysfunction of sacral region: Secondary | ICD-10-CM

## 2021-11-10 MED ORDER — TRAZODONE HCL 50 MG PO TABS
25.0000 mg | ORAL_TABLET | Freq: Every evening | ORAL | 3 refills | Status: DC | PRN
  Filled 2021-11-10: qty 30, 30d supply, fill #0
  Filled 2022-01-06 – 2022-02-04 (×2): qty 30, 30d supply, fill #1

## 2021-11-10 NOTE — Patient Instructions (Addendum)
Good to see you Trigger point injections today Trazadone refilled See me again in 5-6 weeks

## 2021-11-10 NOTE — Assessment & Plan Note (Signed)
Continues to have intermittent radicular symptoms on the left leg.  Attempted osteopathic manipulation again. Discussed with patient about icing regimen and home exercises.he continues to take different medications.  Follow-up again in 6 to 8 weeks.

## 2021-11-11 ENCOUNTER — Other Ambulatory Visit (HOSPITAL_COMMUNITY): Payer: Self-pay

## 2021-11-13 DIAGNOSIS — G4733 Obstructive sleep apnea (adult) (pediatric): Secondary | ICD-10-CM | POA: Diagnosis not present

## 2021-11-16 ENCOUNTER — Ambulatory Visit: Payer: BC Managed Care – PPO | Admitting: Family Medicine

## 2021-11-22 ENCOUNTER — Ambulatory Visit (INDEPENDENT_AMBULATORY_CARE_PROVIDER_SITE_OTHER): Payer: BC Managed Care – PPO | Admitting: Family Medicine

## 2021-11-22 ENCOUNTER — Encounter: Payer: Self-pay | Admitting: Family Medicine

## 2021-11-22 ENCOUNTER — Other Ambulatory Visit (HOSPITAL_COMMUNITY): Payer: Self-pay

## 2021-11-22 VITALS — BP 116/86 | HR 78 | Ht 72.0 in | Wt 291.0 lb

## 2021-11-22 DIAGNOSIS — M9901 Segmental and somatic dysfunction of cervical region: Secondary | ICD-10-CM

## 2021-11-22 DIAGNOSIS — M9904 Segmental and somatic dysfunction of sacral region: Secondary | ICD-10-CM | POA: Diagnosis not present

## 2021-11-22 DIAGNOSIS — M25531 Pain in right wrist: Secondary | ICD-10-CM | POA: Diagnosis not present

## 2021-11-22 DIAGNOSIS — M9908 Segmental and somatic dysfunction of rib cage: Secondary | ICD-10-CM

## 2021-11-22 DIAGNOSIS — M5416 Radiculopathy, lumbar region: Secondary | ICD-10-CM | POA: Diagnosis not present

## 2021-11-22 DIAGNOSIS — M9903 Segmental and somatic dysfunction of lumbar region: Secondary | ICD-10-CM | POA: Diagnosis not present

## 2021-11-22 DIAGNOSIS — M9902 Segmental and somatic dysfunction of thoracic region: Secondary | ICD-10-CM

## 2021-11-22 DIAGNOSIS — M5134 Other intervertebral disc degeneration, thoracic region: Secondary | ICD-10-CM | POA: Diagnosis not present

## 2021-11-22 DIAGNOSIS — R2231 Localized swelling, mass and lump, right upper limb: Secondary | ICD-10-CM | POA: Diagnosis not present

## 2021-11-22 MED ORDER — PREDNISONE 50 MG PO TABS
ORAL_TABLET | ORAL | 0 refills | Status: DC
  Filled 2021-11-22: qty 5, 5d supply, fill #0

## 2021-11-22 MED ORDER — DULOXETINE HCL 20 MG PO CPEP
40.0000 mg | ORAL_CAPSULE | Freq: Every day | ORAL | 0 refills | Status: DC
  Filled 2021-11-22 – 2022-01-06 (×2): qty 60, 30d supply, fill #0

## 2021-11-22 MED ORDER — KETOROLAC TROMETHAMINE 30 MG/ML IJ SOLN
60.0000 mg | Freq: Once | INTRAMUSCULAR | Status: AC
  Administered 2021-11-22: 60 mg via INTRAMUSCULAR

## 2021-11-22 MED ORDER — METHYLPREDNISOLONE ACETATE 80 MG/ML IJ SUSP
80.0000 mg | Freq: Once | INTRAMUSCULAR | Status: AC
  Administered 2021-11-22: 80 mg via INTRAMUSCULAR

## 2021-11-22 NOTE — Addendum Note (Signed)
Addended by: Carmie Kanner on: 11/22/2021 10:50 AM   Modules accepted: Orders

## 2021-11-22 NOTE — Assessment & Plan Note (Signed)
Chronic, with exacerbation.  Discussed with patient again.  Toradol and Depo-Medrol given today.  5-day course of prednisone.  We were attempting to try to titrate down off of the Cymbalta but will go back up to 40 mg for this exacerbation.  Follow-up again as scheduled.  Can consider the possibility of a dural as well.  Patient has had

## 2021-11-22 NOTE — Progress Notes (Signed)
[place] Phone: [phone] Subjective:   Frank [name], am serving as a scribe for Dr. Hulan Saas.  I'm seeing this patient by the request  of:  Harrison Mons, PA  CC: Patient does have back pain that is worsening  [id number]  Frank [name] is a 31 y.o. male coming in with complaint of back and neck pain. OMT 11/10/2021. Patient states that injection last visit did not help. Lower back and hip pain is sharp. Hard to perform sit to stand without pain.  Patient states that it is affecting daily activities.  More radicular symptoms down the leg at the moment.    Medications patient has been prescribed: Cymbalta did titrate down to 20 mg.  I, Synthroid, Trazadone  Taking:         Reviewed prior external information including notes and imaging from previsou exam, outside providers and external EMR if available.   As well as notes that were available from care everywhere and other healthcare systems.  Past medical history, social, surgical and family history all reviewed in electronic medical record.  [name] pertanent information unless stated regarding to the chief complaint.   Past Medical History:  Diagnosis Date   ADD (attention deficit disorder)    Anxiety    Narcolepsy through school    [name] Known Allergies   Review of Systems:  [name] headache, visual changes, nausea, vomiting, diarrhea, constipation, dizziness, abdominal pain, skin rash, fevers, chills, night sweats, weight loss, swollen lymph nodes, joint swelling, chest pain, shortness of breath, mood changes. POSITIVE muscle aches, body aches, joint pain  Objective  Blood pressure 116/86, pulse 78, height 6' (1.829 m), weight 291 lb (132 kg), SpO2 98 %.   General: [name] apparent distress alert and oriented x3 mood and affect normal, dressed appropriately.  HEENT: Pupils equal, extraocular movements intact  Respiratory: Patient's speak in  full sentences and does not appear short of breath  Cardiovascular: [name] lower extremity edema, non tender, [name] erythema  Gait MSK:  Back does have loss of lordosis.  Some tenderness to palpation.  Patient continues to have difficulty noted.  Positive FABER test noted.  Positive straight leg test 4-5 strength in lower extremity.  Does have voluntary guarding noted.  Osteopathic findings  C2 flexed rotated and side bent right C6 flexed rotated and side bent left T3 extended rotated and side bent right inhaled rib T8 extended rotated and side bent left L2 flexed rotated and side bent right Sacrum right on right severe tenderness over the sacroiliac joint     Assessment and Plan:  Mass of skin of right thumb Patient was referred to hand surgery previously and had gone to a different 1.  We will repeat the referral previously.  Patient will likely have this removed in the near future.  Degeneration of intervertebral disc of thoracic region Chronic, with exacerbation.  Discussed with patient again.  Toradol and Depo-Medrol given today.  5-day course of prednisone.  We were attempting to try to titrate down off of the Cymbalta but will go back up to 40 mg for this exacerbation.  Follow-up again as scheduled.  Can consider the possibility of a dural as well.  Patient has had    Nonallopathic problems  Decision today to treat with OMT was based on Physical Exam  After verbal consent patient was treated with HVLA, ME, FPR techniques in cervical, rib, thoracic, lumbar, and sacral  areas  Patient  tolerated the procedure well with improvement in symptoms  Patient given exercises, stretches and lifestyle modifications  See medications in patient instructions if given  Patient will follow up in 4-8 weeks    The above documentation has been reviewed and is accurate and complete Frank Pulley, DO          Note: This dictation was prepared with Dragon dictation along with smaller  phrase technology. Any transcriptional errors that result from this process are unintentional.

## 2021-11-22 NOTE — Patient Instructions (Addendum)
Dr. Hurshel [facility] for the wrist Replaced wrist splint today Injections in backside Prednisone 50mg  for 5 days Cymbalta 40mg  daily Epidural [phone] We will see you as scheduled

## 2021-11-22 NOTE — Assessment & Plan Note (Signed)
Patient was referred to hand surgery previously and had gone to a different 1.  We will repeat the referral previously.  Patient will likely have this removed in the near future.

## 2021-11-24 ENCOUNTER — Ambulatory Visit: Payer: BC Managed Care – PPO | Admitting: Family Medicine

## 2021-12-01 DIAGNOSIS — G4733 Obstructive sleep apnea (adult) (pediatric): Secondary | ICD-10-CM | POA: Diagnosis not present

## 2021-12-05 NOTE — Progress Notes (Unsigned)
[facility] [place] [address] [place] Phone: [phone] Subjective:   Frank [name], am serving as a scribe for Dr. Hulan Saas.  I'm seeing this patient by the request  of:  Harrison Mons, PA  CC: Back pain, neck pain all over pain  [id number]  Frank [name] is a 30 y.o. male coming in with complaint of back and neck pain. OMT 11/22/2021. Patient states doing well. Here for manipulation. Was wondering about trigger point injections today.  Medications patient has been prescribed: Cymbata, Synthroid, Prednisone, Trazadone  Taking:         Reviewed prior external information including notes and imaging from previsou exam, outside providers and external EMR if available.   As well as notes that were available from care everywhere and other healthcare systems.  Past medical history, social, surgical and family history all reviewed in electronic medical record.  No pertanent information unless stated regarding to the chief complaint.   Past Medical History:  Diagnosis Date   ADD (attention deficit disorder)    Anxiety    Narcolepsy through school    No Known Allergies   Review of Systems:  No headache, visual changes, nausea, vomiting, diarrhea, constipation, dizziness, abdominal pain, skin rash, fevers, chills, night sweats, weight loss, swollen lymph nodes, body aches, joint swelling, chest pain, shortness of breath, mood changes. POSITIVE muscle aches  Objective  Blood pressure 128/86, pulse 79, height 6' (1.829 m), weight (!) 303 lb (137.4 kg), SpO2 98 %.   General: No apparent distress alert and oriented x3 mood and affect normal, dressed appropriately.  HEENT: Pupils equal, extraocular movements intact  Respiratory: Patient's speak in full sentences and does not appear short of breath  Cardiovascular: No lower extremity edema, non tender, no erythema  Gait MSK:  Back diffuse tenderness around the axial  skeleton.  Positive straight leg test still noted on the left side.  Osteopathic findings  C2 flexed rotated and side bent right C6 flexed rotated and side bent left T3 extended rotated and side bent right inhaled rib T9 extended rotated and side bent left L2 flexed rotated and side bent right Sacrum right on right       Assessment and Plan:  Acute left lumbar radiculopathy Likely repeating the epidural in the near future.  We have discussed different things such as radiofrequency ablation and other ideas would be spinal stimulator if this chronic problem with worsening symptoms does not seem to improve.  BMI 38.0-38.9,adult Elevated and worsening.  Attempting to titrate off of certain medications to see if this will be beneficial but unfortunately had to go up on the Cymbalta.  May need to consider laboratory work-up again if continuing to have difficulty.  Mass of skin of right thumb Awaiting to see new surgeon .  Discussed with patient about all treatment options.  He continues to grow and will need to be removed. Follow-up with me again in 6 weeks.  Total time discussing all of patient's elements today 33 minutes    Nonallopathic problems  Decision today to treat with OMT was based on Physical Exam  After verbal consent patient was treated with HVLA, ME, FPR techniques in cervical, rib, thoracic, lumbar, and sacral  areas  Patient tolerated the procedure well with improvement in symptoms  Patient given exercises, stretches and lifestyle modifications  See medications in patient instructions if given  Patient will follow up in 4-8 weeks    The above documentation has  been reviewed and is accurate and complete [name], DO          Note: This dictation was prepared with Dragon dictation along with smaller phrase technology. Any transcriptional errors that result from this process are unintentional.

## 2021-12-06 ENCOUNTER — Other Ambulatory Visit: Payer: Self-pay | Admitting: Family Medicine

## 2021-12-06 ENCOUNTER — Other Ambulatory Visit (HOSPITAL_COMMUNITY): Payer: Self-pay

## 2021-12-06 MED ORDER — VITAMIN D3 1.25 MG (50000 UT) PO CAPS
1.0000 | ORAL_CAPSULE | ORAL | 3 refills | Status: AC
  Filled 2021-12-06 – 2022-01-06 (×2): qty 12, 84d supply, fill #0
  Filled 2022-02-04 – 2022-07-03 (×2): qty 12, 84d supply, fill #1
  Filled 2022-11-22: qty 12, 84d supply, fill #2

## 2021-12-06 MED ORDER — METHOCARBAMOL 500 MG PO TABS
500.0000 mg | ORAL_TABLET | Freq: Four times a day (QID) | ORAL | 1 refills | Status: DC | PRN
  Filled 2021-12-06: qty 120, 30d supply, fill #0
  Filled 2022-01-06: qty 120, 30d supply, fill #1

## 2021-12-06 MED ORDER — OXYCODONE HCL 10 MG PO TABS
5.0000 mg | ORAL_TABLET | Freq: Three times a day (TID) | ORAL | 0 refills | Status: DC | PRN
  Filled 2021-12-09: qty 90, 30d supply, fill #0

## 2021-12-06 MED ORDER — AMPHETAMINE-DEXTROAMPHETAMINE 30 MG PO TABS
30.0000 mg | ORAL_TABLET | Freq: Two times a day (BID) | ORAL | 0 refills | Status: DC
  Filled 2021-12-09: qty 75, 30d supply, fill #0

## 2021-12-06 MED ORDER — BENZONATATE 200 MG PO CAPS
200.0000 mg | ORAL_CAPSULE | Freq: Three times a day (TID) | ORAL | 0 refills | Status: DC | PRN
  Filled 2021-12-06: qty 20, 7d supply, fill #0

## 2021-12-06 MED ORDER — ALBUTEROL SULFATE HFA 108 (90 BASE) MCG/ACT IN AERS
2.0000 | INHALATION_SPRAY | RESPIRATORY_TRACT | 1 refills | Status: DC | PRN
Start: 2021-12-06 — End: 2022-03-08
  Filled 2021-12-06 – 2022-01-06 (×2): qty 6.7, 17d supply, fill #0
  Filled 2022-02-04: qty 6.7, 17d supply, fill #1

## 2021-12-06 MED ORDER — BUPROPION HCL ER (XL) 300 MG PO TB24
300.0000 mg | ORAL_TABLET | Freq: Every day | ORAL | 3 refills | Status: DC
  Filled 2021-12-06 – 2022-02-04 (×3): qty 90, 90d supply, fill #0

## 2021-12-07 ENCOUNTER — Ambulatory Visit (INDEPENDENT_AMBULATORY_CARE_PROVIDER_SITE_OTHER): Payer: BC Managed Care – PPO | Admitting: Family Medicine

## 2021-12-07 ENCOUNTER — Other Ambulatory Visit (HOSPITAL_COMMUNITY): Payer: Self-pay

## 2021-12-07 VITALS — BP 128/86 | HR 79 | Ht 72.0 in | Wt 303.0 lb

## 2021-12-07 DIAGNOSIS — M9908 Segmental and somatic dysfunction of rib cage: Secondary | ICD-10-CM | POA: Diagnosis not present

## 2021-12-07 DIAGNOSIS — M5416 Radiculopathy, lumbar region: Secondary | ICD-10-CM | POA: Diagnosis not present

## 2021-12-07 DIAGNOSIS — M9901 Segmental and somatic dysfunction of cervical region: Secondary | ICD-10-CM | POA: Diagnosis not present

## 2021-12-07 DIAGNOSIS — Z6838 Body mass index (BMI) 38.0-38.9, adult: Secondary | ICD-10-CM | POA: Diagnosis not present

## 2021-12-07 DIAGNOSIS — M9904 Segmental and somatic dysfunction of sacral region: Secondary | ICD-10-CM

## 2021-12-07 DIAGNOSIS — M9902 Segmental and somatic dysfunction of thoracic region: Secondary | ICD-10-CM

## 2021-12-07 DIAGNOSIS — M9903 Segmental and somatic dysfunction of lumbar region: Secondary | ICD-10-CM

## 2021-12-07 DIAGNOSIS — R2231 Localized swelling, mass and lump, right upper limb: Secondary | ICD-10-CM | POA: Diagnosis not present

## 2021-12-07 NOTE — Assessment & Plan Note (Signed)
Elevated and worsening.  Attempting to titrate off of certain medications to see if this will be beneficial but unfortunately had to go up on the Cymbalta.  May need to consider laboratory work-up again if continuing to have difficulty.

## 2021-12-07 NOTE — Patient Instructions (Addendum)
Coband around wrist when lifting Trigger points if still painful at next appointment

## 2021-12-07 NOTE — Assessment & Plan Note (Signed)
Awaiting to see new surgeon .  Discussed with patient about all treatment options.  He continues to grow and will need to be removed. Follow-up with me again in 6 weeks.  Total time discussing all of patient's elements today 33 minutes

## 2021-12-07 NOTE — Assessment & Plan Note (Signed)
Likely repeating the epidural in the near future.  We have discussed different things such as radiofrequency ablation and other ideas would be spinal stimulator if this chronic problem with worsening symptoms does not seem to improve.

## 2021-12-08 DIAGNOSIS — M67441 Ganglion, right hand: Secondary | ICD-10-CM | POA: Diagnosis not present

## 2021-12-08 DIAGNOSIS — M25331 Other instability, right wrist: Secondary | ICD-10-CM | POA: Diagnosis not present

## 2021-12-09 ENCOUNTER — Other Ambulatory Visit (HOSPITAL_COMMUNITY): Payer: Self-pay

## 2021-12-09 ENCOUNTER — Ambulatory Visit
Admission: RE | Admit: 2021-12-09 | Discharge: 2021-12-09 | Disposition: A | Discharge status: Home / Self Care | Payer: BC Managed Care – PPO | Source: Ambulatory Visit | Attending: Family Medicine | Admitting: Family Medicine

## 2021-12-09 DIAGNOSIS — M5416 Radiculopathy, lumbar region: Secondary | ICD-10-CM

## 2021-12-09 DIAGNOSIS — M47817 Spondylosis without myelopathy or radiculopathy, lumbosacral region: Secondary | ICD-10-CM | POA: Diagnosis not present

## 2021-12-09 DIAGNOSIS — M5116 Intervertebral disc disorders with radiculopathy, lumbar region: Secondary | ICD-10-CM | POA: Diagnosis not present

## 2021-12-09 MED ORDER — IOPAMIDOL (ISOVUE-M 200) INJECTION 41%
1.0000 mL | Freq: Once | INTRAMUSCULAR | Status: AC
  Administered 2021-12-09: 1 mL via EPIDURAL

## 2021-12-09 MED ORDER — METHYLPREDNISOLONE ACETATE 40 MG/ML INJ SUSP (RADIOLOG
80.0000 mg | Freq: Once | INTRAMUSCULAR | Status: AC
  Administered 2021-12-09: 80 mg via EPIDURAL

## 2021-12-09 NOTE — Discharge Instructions (Signed)

## 2021-12-14 DIAGNOSIS — G4719 Other hypersomnia: Secondary | ICD-10-CM | POA: Diagnosis not present

## 2021-12-14 DIAGNOSIS — F988 Other specified behavioral and emotional disorders with onset usually occurring in childhood and adolescence: Secondary | ICD-10-CM | POA: Diagnosis not present

## 2021-12-14 DIAGNOSIS — D2111 Benign neoplasm of connective and other soft tissue of right upper limb, including shoulder: Secondary | ICD-10-CM | POA: Diagnosis not present

## 2021-12-14 DIAGNOSIS — Z79891 Long term (current) use of opiate analgesic: Secondary | ICD-10-CM | POA: Diagnosis not present

## 2021-12-14 DIAGNOSIS — G4733 Obstructive sleep apnea (adult) (pediatric): Secondary | ICD-10-CM | POA: Diagnosis not present

## 2021-12-14 DIAGNOSIS — R2231 Localized swelling, mass and lump, right upper limb: Secondary | ICD-10-CM | POA: Diagnosis not present

## 2021-12-14 DIAGNOSIS — F4321 Adjustment disorder with depressed mood: Secondary | ICD-10-CM | POA: Diagnosis not present

## 2021-12-16 DIAGNOSIS — Z23 Encounter for immunization: Secondary | ICD-10-CM | POA: Diagnosis not present

## 2021-12-22 NOTE — Progress Notes (Signed)
[name] [address] [place] [address] Phone: [phone] Subjective:   Frank [name], am serving as a scribe for Dr. Antoine Primas.  I'm seeing this patient by the request  of:  Porfirio Oar, PA  CC: Back pain, neck pain  [id number]  Frank [name] is a 31 y.o. male coming in with complaint of back and neck pain. OMT 12/07/2021.  Continues on chronic pain.  Has not been able to do as much activities as usual secondary to the discomfort and pain and recent surgery for the mass on his tongue.  Patient states that he is already cleared to start doing all activities in the future  Medications patient has been prescribed: Cymbalta, Robaxin, Prednisone, Trazadone   Taking:          Reviewed prior external information including notes and imaging from previsou exam, outside providers and external EMR if available.   As well as notes that were available from care everywhere and other healthcare systems.  Past medical history, social, surgical and family history all reviewed in electronic medical record.  No pertanent information unless stated regarding to the chief complaint.   Past Medical History:  Diagnosis Date   ADD (attention deficit disorder)    Anxiety    Narcolepsy through school    No Known Allergies   Review of Systems:  No headache, visual changes, nausea, vomiting, diarrhea, constipation, dizziness, abdominal pain, skin rash, fevers, chills, night sweats, weight loss, swollen lymph nodes,  joint swelling, chest pain, shortness of breath, mood changes. POSITIVE muscle aches, body aches  Objective  Blood pressure 120/84, pulse 78, height 6' (1.829 m), weight (!) 304 lb (137.9 kg), SpO2 97 %.   General: No apparent distress alert and oriented x3 mood and affect normal, dressed appropriately.  HEENT: Pupils equal, extraocular movements intact  Respiratory: Patient's speak in full sentences and does not appear  short of breath  Cardiovascular: No lower extremity edema, non tender, no erythema  Neck exam does have some loss of lordosis.  Patient does have tightness noted in the paraspinal musculature of the lumbar spine.  Patient does have some limited range of motion in all planes.  Patient is put on some weight since we have seen him.  Osteopathic findings  C3 flexed rotated and side bent right C6 flexed rotated and side bent left T4 extended rotated and side bent right inhaled rib T9 extended rotated and side bent left L2 flexed rotated and side bent right Sacrum right on right       Assessment and Plan:  Mass of skin of right thumb Patient had removal.  Does still have some sutures in place.  Patient is to follow-up with hand surgery at this time Present.  Myofascial pain Myofascial plane and is somewhat little worse with patient not being as active secondary to recent hand surgery.  I think when patient is still needing to start increasing activity will do better.  Does respond well to osteopathic manipulation.  Continue the Cymbalta.  Follow-up again 6 to 8 weeks.    Nonallopathic problems  Decision today to treat with OMT was based on Physical Exam  After verbal consent patient was treated with HVLA, ME, FPR techniques in cervical, rib, thoracic, lumbar, and sacral  areas  Patient tolerated the procedure well with improvement in symptoms  Patient given exercises, stretches and lifestyle modifications  See medications in patient instructions if given  Patient will follow up in 4-8  weeks    The above documentation has been reviewed and is accurate and complete Lyndal Pulley, DO          Note: This dictation was prepared with Dragon dictation along with smaller phrase technology. Any transcriptional errors that result from this process are unintentional.

## 2021-12-28 ENCOUNTER — Ambulatory Visit (INDEPENDENT_AMBULATORY_CARE_PROVIDER_SITE_OTHER): Payer: BC Managed Care – PPO | Admitting: Family Medicine

## 2021-12-28 VITALS — BP 120/84 | HR 78 | Ht 72.0 in | Wt 304.0 lb

## 2021-12-28 DIAGNOSIS — R2231 Localized swelling, mass and lump, right upper limb: Secondary | ICD-10-CM | POA: Diagnosis not present

## 2021-12-28 DIAGNOSIS — M9901 Segmental and somatic dysfunction of cervical region: Secondary | ICD-10-CM

## 2021-12-28 DIAGNOSIS — M9902 Segmental and somatic dysfunction of thoracic region: Secondary | ICD-10-CM

## 2021-12-28 DIAGNOSIS — M9904 Segmental and somatic dysfunction of sacral region: Secondary | ICD-10-CM

## 2021-12-28 DIAGNOSIS — M7918 Myalgia, other site: Secondary | ICD-10-CM | POA: Diagnosis not present

## 2021-12-28 DIAGNOSIS — M9908 Segmental and somatic dysfunction of rib cage: Secondary | ICD-10-CM

## 2021-12-28 DIAGNOSIS — M9903 Segmental and somatic dysfunction of lumbar region: Secondary | ICD-10-CM

## 2021-12-28 NOTE — Assessment & Plan Note (Signed)
Patient had removal.  Does still have some sutures in place.  Patient is to follow-up with hand surgery at this time Present.

## 2021-12-28 NOTE — Assessment & Plan Note (Signed)
Myofascial plane and is somewhat little worse with patient not being as active secondary to recent hand surgery.  I think when patient is still needing to start increasing activity will do better.  Does respond well to osteopathic manipulation.  Continue the Cymbalta.  Follow-up again 6 to 8 weeks.

## 2021-12-28 NOTE — Patient Instructions (Addendum)
Good to see you Glad they got that the bone off the thumb ENT with Annalee Genta or [facility]  phone number [phone] See me again as scheduled

## 2022-01-01 DIAGNOSIS — G4733 Obstructive sleep apnea (adult) (pediatric): Secondary | ICD-10-CM | POA: Diagnosis not present

## 2022-01-06 ENCOUNTER — Other Ambulatory Visit (HOSPITAL_COMMUNITY): Payer: Self-pay

## 2022-01-06 MED ORDER — BENZONATATE 200 MG PO CAPS
200.0000 mg | ORAL_CAPSULE | Freq: Three times a day (TID) | ORAL | 0 refills | Status: DC | PRN
  Filled 2022-01-06: qty 20, 7d supply, fill #0

## 2022-01-06 MED ORDER — AMPHETAMINE-DEXTROAMPHETAMINE 30 MG PO TABS
30.0000 mg | ORAL_TABLET | Freq: Two times a day (BID) | ORAL | 0 refills | Status: DC
  Filled 2022-01-06: qty 75, 30d supply, fill #0

## 2022-01-06 MED ORDER — OXYCODONE HCL 10 MG PO TABS
5.0000 mg | ORAL_TABLET | Freq: Three times a day (TID) | ORAL | 0 refills | Status: DC | PRN
  Filled 2022-01-06: qty 90, 30d supply, fill #0

## 2022-01-13 DIAGNOSIS — G4733 Obstructive sleep apnea (adult) (pediatric): Secondary | ICD-10-CM | POA: Diagnosis not present

## 2022-01-13 NOTE — Progress Notes (Unsigned)
  [name] [address] [place] [address] Phone: [phone] Subjective:    I'm seeing this patient by the request  of:  Porfirio Oar, PA  CC: Multiple joint complaints  [id number]  Frank [name] is a 31 y.o. male coming in with complaint of back and neck pain. OMT 12/28/2021. Patient states here for routine OMT, but is really tight in lower back and is wanting an injection. Patient is getting ready to go on a skiing trip to [place].   Medications patient has been prescribed: Cymbalta, Synthroid, Robaxin  Taking: Doing well with the 40 mg of Cymbalta.         Reviewed prior external information including notes and imaging from previsou exam, outside providers and external EMR if available.   As well as notes that were available from care everywhere and other healthcare systems.  Past medical history, social, surgical and family history all reviewed in electronic medical record.  No pertanent information unless stated regarding to the chief complaint.   Past Medical History:  Diagnosis Date   ADD (attention deficit disorder)    Anxiety    Narcolepsy through school    No Known Allergies   Review of Systems:  No headache, visual changes, nausea, vomiting, diarrhea, constipation, dizziness, abdominal pain, skin rash, fevers, chills, night sweats, weight loss, swollen lymph nodes, body aches, joint swelling, chest pain, shortness of breath, mood changes. POSITIVE muscle aches  Objective  Blood pressure 122/86, pulse 84, height 6' (1.829 m), weight (!) 301 lb (136.5 kg), SpO2 98 %.   General: No apparent distress alert and oriented x3 mood and affect normal, dressed appropriately.  HEENT: Pupils equal, extraocular movements intact  Respiratory: Patient's speak in full sentences and does not appear short of breath  Cardiovascular: No lower extremity edema, non tender, no erythema  Continue to this mostly in the thoracolumbar  juncture as well as in the sacroiliac joint.  There is some tightness with FABER test.  Osteopathic findings  C2 flexed rotated and side bent right C7 flexed rotated and side bent left T3 extended rotated and side bent right inhaled rib T8 extended rotated and side bent left L2 flexed rotated and side bent right Sacrum right on right       Assessment and Plan:  Scheuermann's disease Chronic problem with exacerbation again.  Patient will be traveling.  Toradol and Depo-Medrol injections given today, discussed icing regimen and home exercises, discussed which activities to do and which ones to avoid.  Increase activity slowly over the course of the next several weeks.  Follow-up with me again in 6 to 8 weeks.    Nonallopathic problems  Decision today to treat with OMT was based on Physical Exam  After verbal consent patient was treated with HVLA, ME, FPR techniques in cervical, rib, thoracic, lumbar, and sacral  areas  Patient tolerated the procedure well with improvement in symptoms  Patient given exercises, stretches and lifestyle modifications  See medications in patient instructions if given  Patient will follow up in 4-8 weeks    The above documentation has been reviewed and is accurate and complete [name], DO           Note: This dictation was prepared with Dragon dictation along with smaller phrase technology. Any transcriptional errors that result from this process are unintentional.

## 2022-01-18 ENCOUNTER — Encounter: Payer: Self-pay | Admitting: Family Medicine

## 2022-01-18 ENCOUNTER — Ambulatory Visit (INDEPENDENT_AMBULATORY_CARE_PROVIDER_SITE_OTHER): Payer: BC Managed Care – PPO | Admitting: Family Medicine

## 2022-01-18 VITALS — BP 122/86 | HR 84 | Ht 72.0 in | Wt 301.0 lb

## 2022-01-18 DIAGNOSIS — J329 Chronic sinusitis, unspecified: Secondary | ICD-10-CM | POA: Diagnosis not present

## 2022-01-18 DIAGNOSIS — M9902 Segmental and somatic dysfunction of thoracic region: Secondary | ICD-10-CM | POA: Diagnosis not present

## 2022-01-18 DIAGNOSIS — M9903 Segmental and somatic dysfunction of lumbar region: Secondary | ICD-10-CM

## 2022-01-18 DIAGNOSIS — M9904 Segmental and somatic dysfunction of sacral region: Secondary | ICD-10-CM

## 2022-01-18 DIAGNOSIS — M9901 Segmental and somatic dysfunction of cervical region: Secondary | ICD-10-CM | POA: Diagnosis not present

## 2022-01-18 DIAGNOSIS — M9908 Segmental and somatic dysfunction of rib cage: Secondary | ICD-10-CM

## 2022-01-18 DIAGNOSIS — M4200 Juvenile osteochondrosis of spine, site unspecified: Secondary | ICD-10-CM | POA: Diagnosis not present

## 2022-01-18 MED ORDER — KETOROLAC TROMETHAMINE 60 MG/2ML IM SOLN
60.0000 mg | Freq: Once | INTRAMUSCULAR | Status: AC
  Administered 2022-01-18: 60 mg via INTRAMUSCULAR

## 2022-01-18 MED ORDER — METHYLPREDNISOLONE ACETATE 80 MG/ML IJ SUSP
80.0000 mg | Freq: Once | INTRAMUSCULAR | Status: AC
  Administered 2022-01-18: 80 mg via INTRAMUSCULAR

## 2022-01-18 MED ORDER — PREDNISONE 50 MG PO TABS: ORAL_TABLET | ORAL | 0 refills | Status: DC

## 2022-01-18 NOTE — Assessment & Plan Note (Signed)
Chronic problem with exacerbation again.  Patient will be traveling.  Toradol and Depo-Medrol injections given today, discussed icing regimen and home exercises, discussed which activities to do and which ones to avoid.  Increase activity slowly over the course of the next several weeks.  Follow-up with me again in 6 to 8 weeks.

## 2022-01-18 NOTE — Patient Instructions (Addendum)
Good to see you  Good luck and be safe skiing Injection in backside today Prednsione just in case prescription  [place] [facility]  See you at your scheduled appointment but if cancellation will see you next week or we could try week you return as well

## 2022-01-19 ENCOUNTER — Other Ambulatory Visit (HOSPITAL_COMMUNITY): Payer: Self-pay

## 2022-01-19 DIAGNOSIS — H6591 Unspecified nonsuppurative otitis media, right ear: Secondary | ICD-10-CM | POA: Diagnosis not present

## 2022-01-19 MED ORDER — OFLOXACIN 0.3 % OT SOLN
OTIC | 0 refills | Status: DC
  Filled 2022-01-19: qty 5, 6d supply, fill #0

## 2022-01-19 NOTE — Progress Notes (Signed)
[name] [address] [place] [address] Phone: [phone] Subjective:   Frank [name], am serving as a scribe for Dr. Antoine Primas.  I'm seeing this patient by the request  of:  Porfirio Oar, PA  CC: Low back pain follow-up  [id number]  JOURNEE KOHEN is a 31 y.o. male coming in with complaint of back and neck pain. OMT 12/28/2021. Patient states just here for a quick OMT before he goes skiing patient recently did have cyst accident where patient did fall.  Patient was holding a child and fell directly on his left side.  Patient states that it was for severe with some mild increase in radicular symptoms but   Medications patient has been prescribed: Prednisone, Trazadone, Robaxin, Cymbalta  Taking:         Reviewed prior external information including notes and imaging from previsou exam, outside providers and external EMR if available.   As well as notes that were available from care everywhere and other healthcare systems.  Past medical history, social, surgical and family history all reviewed in electronic medical record.  No pertanent information unless stated regarding to the chief complaint.   Past Medical History:  Diagnosis Date   ADD (attention deficit disorder)    Anxiety    Narcolepsy through school    No Known Allergies   Review of Systems:  No headache, visual changes, nausea, vomiting, diarrhea, constipation, dizziness, abdominal pain, skin rash, fevers, chills, night sweats, weight loss, swollen lymph nodes,  joint swelling, chest pain, shortness of breath, mood changes. POSITIVE muscle aches, body aches  Objective  Blood pressure 122/84, height 6' (1.829 m).   General: No apparent distress alert and oriented x3 mood and affect normal, dressed appropriately.  HEENT: Pupils equal, extraocular movements intact  Respiratory: Patient's speak in full sentences and does not appear short of breath   Cardiovascular: No lower extremity edema, non tender, no erythema  Low back exam does have some loss of lordosis.  Some tenderness to palpation in the paraspinal musculature.  Osteopathic findings  C2 flexed rotated and side bent right C5 flexed rotated and side bent left T3 extended rotated and side bent right inhaled rib T7 extended rotated and side bent right T9 extended rotated and side bent left L2 flexed rotated and side bent right Sacrum right on right       Assessment and Plan:  Acute left lumbar radiculopathy Patient did have an injury with an acute on chronic exacerbation of patient's back.  Patient wants to hold on anything such as another nerve root injection but would like a Toradol and Depo-Medrol given today.  Discussed with patient about icing regimen and home exercises, which activities to do and which ones to avoid.  Patient is going to follow-up with me again in 4 weeks    Nonallopathic problems  Decision today to treat with OMT was based on Physical Exam  After verbal consent patient was treated with HVLA, ME, FPR techniques in cervical, rib, thoracic, lumbar, and sacral  areas  Patient tolerated the procedure well with improvement in symptoms  Patient given exercises, stretches and lifestyle modifications  See medications in patient instructions if given  Patient will follow up in 4-8 weeks     The above documentation has been reviewed and is accurate and complete [name], DO         Note: This dictation was prepared with Dragon dictation along with smaller phrase technology. Any  transcriptional errors that result from this process are unintentional.

## 2022-01-22 ENCOUNTER — Other Ambulatory Visit: Payer: Self-pay | Admitting: Family Medicine

## 2022-01-25 ENCOUNTER — Ambulatory Visit (INDEPENDENT_AMBULATORY_CARE_PROVIDER_SITE_OTHER): Payer: BC Managed Care – PPO | Admitting: Family Medicine

## 2022-01-25 VITALS — BP 122/84 | Ht 72.0 in

## 2022-01-25 DIAGNOSIS — M9904 Segmental and somatic dysfunction of sacral region: Secondary | ICD-10-CM | POA: Diagnosis not present

## 2022-01-25 DIAGNOSIS — M9908 Segmental and somatic dysfunction of rib cage: Secondary | ICD-10-CM | POA: Diagnosis not present

## 2022-01-25 DIAGNOSIS — M5416 Radiculopathy, lumbar region: Secondary | ICD-10-CM | POA: Diagnosis not present

## 2022-01-25 DIAGNOSIS — M9902 Segmental and somatic dysfunction of thoracic region: Secondary | ICD-10-CM

## 2022-01-25 DIAGNOSIS — M9903 Segmental and somatic dysfunction of lumbar region: Secondary | ICD-10-CM

## 2022-01-25 DIAGNOSIS — M9901 Segmental and somatic dysfunction of cervical region: Secondary | ICD-10-CM

## 2022-01-25 MED ORDER — METHYLPREDNISOLONE ACETATE 80 MG/ML IJ SUSP
80.0000 mg | Freq: Once | INTRAMUSCULAR | Status: AC
  Administered 2022-01-25: 80 mg via INTRAMUSCULAR

## 2022-01-25 MED ORDER — KETOROLAC TROMETHAMINE 60 MG/2ML IM SOLN
60.0000 mg | Freq: Once | INTRAMUSCULAR | Status: AC
  Administered 2022-01-25: 60 mg via INTRAMUSCULAR

## 2022-01-25 NOTE — Patient Instructions (Signed)
Good to see you  Have fun on your trip See you at your next scheduled visit

## 2022-01-25 NOTE — Assessment & Plan Note (Signed)
Patient did have an injury with an acute on chronic exacerbation of patient's back.  Patient wants to hold on anything such as another nerve root injection but would like a Toradol and Depo-Medrol given today.  Discussed with patient about icing regimen and home exercises, which activities to do and which ones to avoid.  Patient is going to follow-up with me again in 4 weeks

## 2022-01-31 DIAGNOSIS — G4733 Obstructive sleep apnea (adult) (pediatric): Secondary | ICD-10-CM | POA: Diagnosis not present

## 2022-01-31 NOTE — Progress Notes (Signed)
[name] [address] [place] [address] Phone: [phone] Subjective:    I'm seeing this patient by the request  of:  Porfirio Oar, PA  CC: Back and neck pain follow-up  [id number]  Frank [name] is a 31 y.o. male coming in with complaint of back and neck pain. OMT 01/25/2022. Patient states that he is good , sore from ski , tight from all of it overall but was able to enjoy it and not have significant amount of pain.  Medications patient has been prescribed: Synthroid, Cymbalta, prednisone, Trazadone  Taking: Yes         Reviewed prior external information including notes and imaging from previsou exam, outside providers and external EMR if available.   As well as notes that were available from care everywhere and other healthcare systems.  Past medical history, social, surgical and family history all reviewed in electronic medical record.  No pertanent information unless stated regarding to the chief complaint.   Past Medical History:  Diagnosis Date   ADD (attention deficit disorder)    Anxiety    Narcolepsy through school    No Known Allergies   Review of Systems:  No headache, visual changes, nausea, vomiting, diarrhea, constipation, dizziness, abdominal pain, skin rash, fevers, chills, night sweats, weight loss, swollen lymph nodes, body aches, joint swelling, chest pain, shortness of breath, mood changes. POSITIVE muscle aches  Objective  Blood pressure 132/80, pulse (!) 107, height 6' (1.829 m), weight (!) 302 lb (137 kg), SpO2 99 %.   General: No apparent distress alert and oriented x3 mood and affect normal, dressed appropriately.  HEENT: Pupils equal, extraocular movements intact  Respiratory: Patient's speak in full sentences and does not appear short of breath  Cardiovascular: No lower extremity edema, non tender, no erythema  Low back exam does have some loss of lordosis.  Some tenderness to palpation  in the paraspinal musculature.  Patient does have tightness more in the thoracolumbar juncture and somewhat in the lumbosacral area more than usual.  Seems to be more muscle tightness.  More tenderness over the right sacroiliac joint.  Osteopathic findings  C2 flexed rotated and side bent right C6 flexed rotated and side bent left T3 extended rotated and side bent right inhaled rib T11 extended rotated and side bent left L1 flexed rotated and side bent right Sacrum right on right       Assessment and Plan:  Scheuermann's disease Chronic problem but overall seems to be fairly stable.  Discussed with patient about icing regimen and home exercises.  Patient is going to continue to monitor weight.  Attempting to get BMI under 30 by the end of the year.  Patient has been stable for some time now.  Discussed posture and ergonomics, discussed medications.  Refilled Cymbalta at the 40 mg.  Follow-up with me again in 5 to 6 weeks    Nonallopathic problems  Decision today to treat with OMT was based on Physical Exam  After verbal consent patient was treated with HVLA, ME, FPR techniques in cervical, rib, thoracic, lumbar, and sacral  areas  Patient tolerated the procedure well with improvement in symptoms  Patient given exercises, stretches and lifestyle modifications  See medications in patient instructions if given  Patient will follow up in 4-8 weeks     The above documentation has been reviewed and is accurate and complete [name], DO         Note: This dictation  was prepared with Dragon dictation along with smaller phrase technology. Any transcriptional errors that result from this process are unintentional.

## 2022-02-04 ENCOUNTER — Other Ambulatory Visit: Payer: Self-pay | Admitting: Family Medicine

## 2022-02-04 ENCOUNTER — Other Ambulatory Visit (HOSPITAL_COMMUNITY): Payer: Self-pay

## 2022-02-07 ENCOUNTER — Other Ambulatory Visit: Payer: Self-pay

## 2022-02-07 ENCOUNTER — Other Ambulatory Visit (HOSPITAL_COMMUNITY): Payer: Self-pay

## 2022-02-07 MED ORDER — BENZONATATE 200 MG PO CAPS
200.0000 mg | ORAL_CAPSULE | Freq: Three times a day (TID) | ORAL | 0 refills | Status: DC | PRN
  Filled 2022-02-07: qty 20, 7d supply, fill #0

## 2022-02-07 MED ORDER — METHOCARBAMOL 500 MG PO TABS
500.0000 mg | ORAL_TABLET | Freq: Four times a day (QID) | ORAL | 1 refills | Status: DC | PRN
  Filled 2022-02-07: qty 120, 30d supply, fill #0

## 2022-02-07 MED ORDER — OXYCODONE HCL 10 MG PO TABS
10.0000 mg | ORAL_TABLET | Freq: Three times a day (TID) | ORAL | 0 refills | Status: DC | PRN
  Filled 2022-02-07: qty 90, 30d supply, fill #0

## 2022-02-08 ENCOUNTER — Other Ambulatory Visit (HOSPITAL_COMMUNITY): Payer: Self-pay

## 2022-02-08 ENCOUNTER — Ambulatory Visit (INDEPENDENT_AMBULATORY_CARE_PROVIDER_SITE_OTHER): Payer: BC Managed Care – PPO | Admitting: Family Medicine

## 2022-02-08 VITALS — BP 132/80 | HR 107 | Ht 72.0 in | Wt 302.0 lb

## 2022-02-08 DIAGNOSIS — M9902 Segmental and somatic dysfunction of thoracic region: Secondary | ICD-10-CM

## 2022-02-08 DIAGNOSIS — M9901 Segmental and somatic dysfunction of cervical region: Secondary | ICD-10-CM

## 2022-02-08 DIAGNOSIS — M4200 Juvenile osteochondrosis of spine, site unspecified: Secondary | ICD-10-CM | POA: Diagnosis not present

## 2022-02-08 DIAGNOSIS — M9904 Segmental and somatic dysfunction of sacral region: Secondary | ICD-10-CM | POA: Diagnosis not present

## 2022-02-08 DIAGNOSIS — M9908 Segmental and somatic dysfunction of rib cage: Secondary | ICD-10-CM

## 2022-02-08 DIAGNOSIS — M9903 Segmental and somatic dysfunction of lumbar region: Secondary | ICD-10-CM | POA: Diagnosis not present

## 2022-02-08 MED ORDER — AMPHETAMINE-DEXTROAMPHETAMINE 30 MG PO TABS
ORAL_TABLET | ORAL | 0 refills | Status: DC
  Filled 2022-02-08: qty 75, 30d supply, fill #0

## 2022-02-08 MED ORDER — DULOXETINE HCL 20 MG PO CPEP: 40.0000 mg | ORAL_CAPSULE | Freq: Every day | ORAL | 0 refills | Status: DC

## 2022-02-08 NOTE — Assessment & Plan Note (Signed)
Chronic problem but overall seems to be fairly stable.  Discussed with patient about icing regimen and home exercises.  Patient is going to continue to monitor weight.  Attempting to get BMI under 30 by the end of the year.  Patient has been stable for some time now.  Discussed posture and ergonomics, discussed medications.  Refilled Cymbalta at the 40 mg.  Follow-up with me again in 5 to 6 weeks

## 2022-02-08 NOTE — Patient Instructions (Signed)
So glad you had a good trip  Cymbalta refill 5-6 week follow up

## 2022-02-10 ENCOUNTER — Other Ambulatory Visit (HOSPITAL_COMMUNITY): Payer: Self-pay

## 2022-02-10 ENCOUNTER — Telehealth (INDEPENDENT_AMBULATORY_CARE_PROVIDER_SITE_OTHER): Payer: BC Managed Care – PPO | Admitting: Primary Care

## 2022-02-10 DIAGNOSIS — G4733 Obstructive sleep apnea (adult) (pediatric): Secondary | ICD-10-CM

## 2022-02-10 DIAGNOSIS — J329 Chronic sinusitis, unspecified: Secondary | ICD-10-CM

## 2022-02-10 DIAGNOSIS — J40 Bronchitis, not specified as acute or chronic: Secondary | ICD-10-CM

## 2022-02-10 MED ORDER — MONTELUKAST SODIUM 10 MG PO TABS: 10.0000 mg | ORAL_TABLET | Freq: Every day | ORAL | 1 refills | Status: DC

## 2022-02-10 NOTE — Progress Notes (Signed)
Virtual Visit via Video Note  I connected with Frank [name] on 02/10/22 at  9:00 AM EST by a video enabled telemedicine application and verified that I am speaking with the correct person using two identifiers.  Location: Patient: Home Provider: Office   I discussed the limitations of evaluation and management by telemedicine and the availability of in person appointments. The patient expressed understanding and agreed to proceed.  History of Present Illness:  31 year old male, never smoked. PMH significant for ADD, OCD, moderate OSA.   Previous [facility] pulmonary encounter:  05/04/2021 Patient presents today for sleep consult. He had sleep study in 2016 that showed mild OSA, AHI was 5.4. He was never started on treatment. He has symptoms of loud snoring, witness apnea, restless sleep and daytime sleepiness. He has a family hx sleep apnea. Wife states that his oxygen will drop into the 60s. Typical bedtime is between 8-11pm. He takes 50mg  trazodone for insomnia at bedtime, he has prn Ambien which he uses very infrequently. He wakes up multiple times at night, previously thought this was due to his chronic back pain. He starts his day at 4:30 or 6am. No morning grogginess but reports daytime sleepiness in the afternoon. Drinking a lot of caffeine to stay awake. He takes adderall 15mg  as needed mostly when having to drive, he does not use every day. He has been having palpitations and dizziness. Denies narcolepsy, cataplexy or sleep walking.   Sleep questionnaire: Symptoms- Loud snoring, waking up gasping for air, daytime sleepiness  Prior sleep study- Yes, early 2s (results available) Bedtime- 8-11pm Time to fall asleep- minutes to hours  Nocturnal awakenings- too many times  Out of bed/start of day- 4:30 or 6am  Weight changes- Fluctuates  Do you operate heavy machinery- No Do you currently wear CPAP-No  Do you current wear oxygen- No  Epworth - 17  06/22/2021 Patient contacted today for  follow-up. Accompanied by wife on virtual video visit. He had a home sleep test on 06/03/21 that showed moderate obstructive sleep apnea, AHI 23.4/hr with SpO2 low 70% (average 90%). Reviewed treatment options. His daytime sleepiness has worsened, he would like to try CPAP. He is no longer taking Ambien to sleep, remains on trazodone 50mg  at bedtime. He is taking Adderall 15mg  TID as needed for ADD symptoms.  Moderate OSA: - Patient has symptoms of loud snoring, waking up gasping for air and daytime sleepiness. Home sleep study on 06/03/21 >> AHI 23.4/hr with SpO2 low 70%. Reviewed treatment options including weight loss, oral appliance, CPAP therapy or referral to ENT. Due to severe of symptoms and daytime sleepiness recommended starting CPAP therapy, patient is agreement with plan.  DME referral placed for new CPAP start auto titrate 5 to 15 cm H2O with mask of choice. Encourage patient aim to wear CPAP every night min 4-6 hours or longer. Advised against driving if experiencing excessive daytime sleepiness. FU in 8 weeks for compliance check.   Orders: Start CPAP 5-15cm h20 with mask of choice/chin strap    09/06/2021 Patient presents today for 8-week follow-up OSA.  Symptoms of loud snoring, waking up gasping for air and daytime sleepiness.  Home sleep study on 06/03/2021 showed moderate obstructive sleep apnea, AHI 23.4 an hour.  During last visit patient was started on CPAP.  Patient was started on CPAP and has been wearing consistently the last 30 days.  He reports significant benefit in sleep quality and energy level since using.  He is able to sleep  through the night and is not as tired the next day.  He is getting good rest.  He is having less chronic pain symptoms and has been able to sleep decrease opioids.  He is feeling a lot better.  Not needing to drink as much caffeine.  Mind is clear.  He recently developed URI symptoms 2 weeks ago and has been having more difficulty wearing his CPAP due to  nasal congestion.  He has an associated productive cough with yellow mucus and wheezing.  His 48-year-old has been sick on and off and plans on getting ear tubes with ENT in August.  Airview download 08/07/2021 - 09/05/2021 Usage days 30/30 days (100%); 26 days (87%): 4 hours Average usage 6 hours 6 minutes Pressure 5 to 15 cm H2O (13.4 cm H2O-95%) Airleaks 1.4L/min (95%) AHI 1.2   02/10/2022- Interim hx  Patient contacted today virtually for for 4-6 month follow-up. Recurrent bronchitis. Associated PND and wheezing. He gets most viral illness his son will get. Cough has been worse last 4 weeks ago. He had an ear infection at the same time. He saw ENT. He has been on 14 day course Augmentin and two rounds of prednisone. He uses Astelin/Fluticasone nasal spray at night. CPAP is working well. He feels more rested.   Airview download 12/10/21-02/07/22 60/60 days (100%); 52 days (87%) > 4 hours Average usage 6 hours 16 mins Pressure 8-18cm h20 (12.9cm h20-95%) Airleaks 16.8L/min AHI 2.4    Observations/Objective:  - Appears well; No overt respiratory symptoms   Assessment and Plan:  OSA: - Patient is compliant with CPAP and reports benefit from use  - Pressure 8-18cm h20; Residual AHI 2.4 - No changes  - Continue to encourage patient wear CPAP nightly  Chronic sinusitis with recurrent bronchitis - Not acutely flared. Eos 300. Maintained on Astelin/fluticasone nasal spray. Starting Singulair 10mg  at bedtime. Checking PFTs. Following with ENT.   Follow Up Instructions:  3 months or sooner if needed    I discussed the assessment and treatment plan with the patient. The patient was provided an opportunity to ask questions and all were answered. The patient agreed with the plan and demonstrated an understanding of the instructions.   The patient was advised to call back or seek an in-person evaluation if the symptoms worsen or if the condition fails to improve as anticipated.  I  provided 25 minutes of non-face-to-face time during this encounter.   , NP

## 2022-02-10 NOTE — Assessment & Plan Note (Signed)
-   Recurrent sinobronchitis. Eos 300. Maintained on Asteling/fluticasone nasal spray. Starting Singulair 10mg  at bedtime. Checking PFTs. Following with ENT.

## 2022-02-10 NOTE — Assessment & Plan Note (Addendum)
-   Patient is compliant with CPAP and reports benefit from use  - Pressure 8-18cm h20; Residual AHI 2.4 - No changes  - Continue to encourage patient wear CPAP nightly

## 2022-02-11 DIAGNOSIS — G4733 Obstructive sleep apnea (adult) (pediatric): Secondary | ICD-10-CM | POA: Diagnosis not present

## 2022-02-13 DIAGNOSIS — G4733 Obstructive sleep apnea (adult) (pediatric): Secondary | ICD-10-CM | POA: Diagnosis not present

## 2022-02-14 ENCOUNTER — Ambulatory Visit (INDEPENDENT_AMBULATORY_CARE_PROVIDER_SITE_OTHER): Payer: BC Managed Care – PPO | Admitting: Pulmonary Disease

## 2022-02-14 DIAGNOSIS — J40 Bronchitis, not specified as acute or chronic: Secondary | ICD-10-CM | POA: Diagnosis not present

## 2022-02-14 DIAGNOSIS — J329 Chronic sinusitis, unspecified: Secondary | ICD-10-CM | POA: Diagnosis not present

## 2022-02-14 LAB — PULMONARY FUNCTION TEST
DL/VA % pred: 102 %
DL/VA: 5.01 ml/min/mmHg/L
DLCO cor % pred: 96 %
DLCO cor: 33.67 ml/min/mmHg
DLCO unc % pred: 96 %
DLCO unc: 33.67 ml/min/mmHg
FEF 25-75 Post: 4.85 L/sec
FEF 25-75 Pre: 3.46 L/sec
FEF2575-%Change-Post: 40 %
FEF2575-%Pred-Post: 104 %
FEF2575-%Pred-Pre: 74 %
FEV1-%Change-Post: 9 %
FEV1-%Pred-Post: 91 %
FEV1-%Pred-Pre: 82 %
FEV1-Post: 4.3 L
FEV1-Pre: 3.92 L
FEV1FVC-%Change-Post: 6 %
FEV1FVC-%Pred-Pre: 94 %
FEV6-%Change-Post: 2 %
FEV6-%Pred-Post: 90 %
FEV6-%Pred-Pre: 87 %
FEV6-Post: 5.18 L
FEV6-Pre: 5.04 L
FEV6FVC-%Change-Post: 0 %
FEV6FVC-%Pred-Post: 101 %
FEV6FVC-%Pred-Pre: 101 %
FVC-%Change-Post: 2 %
FVC-%Pred-Post: 89 %
FVC-%Pred-Pre: 86 %
FVC-Post: 5.19 L
FVC-Pre: 5.04 L
Post FEV1/FVC ratio: 83 %
Post FEV6/FVC ratio: 100 %
Pre FEV1/FVC ratio: 78 %
Pre FEV6/FVC Ratio: 100 %
RV % pred: 156 %
RV: 2.71 L
TLC % pred: 108 %
TLC: 7.89 L

## 2022-02-14 NOTE — Progress Notes (Signed)
PFT done today. 

## 2022-02-16 ENCOUNTER — Other Ambulatory Visit: Payer: Self-pay | Admitting: Family Medicine

## 2022-02-16 DIAGNOSIS — J342 Deviated nasal septum: Secondary | ICD-10-CM | POA: Diagnosis not present

## 2022-02-16 DIAGNOSIS — H6501 Acute serous otitis media, right ear: Secondary | ICD-10-CM | POA: Diagnosis not present

## 2022-02-23 NOTE — Progress Notes (Signed)
[place] Phone: [phone] Subjective:   [name], am serving as a scribe for Dr. Hulan Saas.  I'm seeing this patient by the request  of:  Harrison Mons, PA  CC: Low back pain  [id number]  Frank [name] is a 32 y.o. male coming in with complaint of back and neck pain. OMT 02/08/2022. Patient states that his left hip and sciatic nerve pain seems to be worse than last visit. No new injury. Patient would like to get epidural scheduled.   Medications patient has been prescribed: Cymbalta  Taking: Yes         Reviewed prior external information including notes and imaging from previsou exam, outside providers and external EMR if available.   As well as notes that were available from care everywhere and other healthcare systems.  Past medical history, social, surgical and family history all reviewed in electronic medical record.  No pertanent information unless stated regarding to the chief complaint.   Past Medical History:  Diagnosis Date   ADD (attention deficit disorder)    Anxiety    Narcolepsy through school    No Known Allergies   Review of Systems:  No headache, visual changes, nausea, vomiting, diarrhea, constipation, dizziness, abdominal pain, skin rash, fevers, chills, night sweats, weight loss, swollen lymph nodes, body aches, joint swelling, chest pain, shortness of breath, mood changes. POSITIVE muscle aches  Objective  Blood pressure (!) 128/100, pulse 71, height 6' (1.829 m), weight (!) 305 lb (138.3 kg), SpO2 98 %.   General: No apparent distress alert and oriented x3 mood and affect normal, dressed appropriately.  HEENT: Pupils equal, extraocular movements intact  Respiratory: Patient's speak in full sentences and does not appear short of breath  Cardiovascular: No lower extremity edema, non tender, no erythema  Low back exam does have some loss lordosis.  Some  tenderness to palpation in the paraspinal musculature.  Tightness noted with FABER test bilaterally.  With limited range of motion and other planes.  Tightness noted significantly more than even patient as usual tightness.  Osteopathic findings  C2 flexed rotated and side bent right C6 flexed rotated and side bent left T3 extended rotated and side bent right inhaled rib T9 extended rotated and side bent left L2 flexed rotated and side bent right Sacrum right on right       Assessment and Plan:  Degeneration of intervertebral disc of thoracic region Continues to have the chronic difficulties noted.  Discussed with patient again to work on core strengthening.  Patient has continued to gain weight.  Continue to monitor.  Blood pressure is up as well.  May need to consider given changes in medication if patient continues to have the exacerbations.  Patient given Toradol and Depo-Medrol injection today secondary to the severity.  Attempted osteopathic manipulation and follow-up again in 6 to 8 weeks    Nonallopathic problems  Decision today to treat with OMT was based on Physical Exam  After verbal consent patient was treated with HVLA, ME, FPR techniques in cervical, rib, thoracic, lumbar, and sacral  areas  Patient tolerated the procedure well with improvement in symptoms  Patient given exercises, stretches and lifestyle modifications  See medications in patient instructions if given  Patient will follow up in 4-8 weeks    The above documentation has been reviewed and is accurate and complete Lyndal Pulley, DO  Note: This dictation was prepared with Dragon dictation along with smaller phrase technology. Any transcriptional errors that result from this process are unintentional.

## 2022-03-01 ENCOUNTER — Ambulatory Visit (INDEPENDENT_AMBULATORY_CARE_PROVIDER_SITE_OTHER): Payer: BC Managed Care – PPO | Admitting: Family Medicine

## 2022-03-01 VITALS — BP 128/100 | HR 71 | Ht 72.0 in | Wt 305.0 lb

## 2022-03-01 DIAGNOSIS — M9903 Segmental and somatic dysfunction of lumbar region: Secondary | ICD-10-CM

## 2022-03-01 DIAGNOSIS — M9902 Segmental and somatic dysfunction of thoracic region: Secondary | ICD-10-CM

## 2022-03-01 DIAGNOSIS — M9904 Segmental and somatic dysfunction of sacral region: Secondary | ICD-10-CM

## 2022-03-01 DIAGNOSIS — M9908 Segmental and somatic dysfunction of rib cage: Secondary | ICD-10-CM | POA: Diagnosis not present

## 2022-03-01 DIAGNOSIS — M9901 Segmental and somatic dysfunction of cervical region: Secondary | ICD-10-CM

## 2022-03-01 DIAGNOSIS — M5134 Other intervertebral disc degeneration, thoracic region: Secondary | ICD-10-CM | POA: Diagnosis not present

## 2022-03-01 MED ORDER — METHYLPREDNISOLONE ACETATE 80 MG/ML IJ SUSP
80.0000 mg | Freq: Once | INTRAMUSCULAR | Status: AC
  Administered 2022-03-01: 80 mg via INTRAMUSCULAR

## 2022-03-01 MED ORDER — KETOROLAC TROMETHAMINE 60 MG/2ML IM SOLN
60.0000 mg | Freq: Once | INTRAMUSCULAR | Status: AC
  Administered 2022-03-01: 60 mg via INTRAMUSCULAR

## 2022-03-01 NOTE — Assessment & Plan Note (Signed)
Continues to have the chronic difficulties noted.  Discussed with patient again to work on core strengthening.  Patient has continued to gain weight.  Continue to monitor.  Blood pressure is up as well.  May need to consider given changes in medication if patient continues to have the exacerbations.  Patient given Toradol and Depo-Medrol injection today secondary to the severity.  Attempted osteopathic manipulation and follow-up again in 6 to 8 weeks

## 2022-03-01 NOTE — Patient Instructions (Signed)
Tennis ball in back pocket If not better in 2 weeks write [place] for epidural See me in 4-6 weeks

## 2022-03-03 DIAGNOSIS — G4733 Obstructive sleep apnea (adult) (pediatric): Secondary | ICD-10-CM | POA: Diagnosis not present

## 2022-03-03 NOTE — Progress Notes (Signed)
Please let patient know pulmonary function testing was normal, no evidence of obstructive lung disease.  We checked breathing test due to recurrent sinusitis/bronchitis.  Advise he continue to follow with ear nose and throat doctor.

## 2022-03-05 ENCOUNTER — Other Ambulatory Visit: Payer: Self-pay | Admitting: Primary Care

## 2022-03-08 ENCOUNTER — Other Ambulatory Visit (HOSPITAL_COMMUNITY): Payer: Self-pay

## 2022-03-08 ENCOUNTER — Encounter: Payer: Self-pay | Admitting: *Deleted

## 2022-03-08 MED ORDER — AMPHETAMINE-DEXTROAMPHETAMINE 30 MG PO TABS
ORAL_TABLET | ORAL | 0 refills | Status: DC
  Filled 2022-03-10: qty 75, 30d supply, fill #0

## 2022-03-08 MED ORDER — AZELASTINE HCL 137 MCG/SPRAY NA SOLN
2.0000 | Freq: Two times a day (BID) | NASAL | 99 refills | Status: AC | PRN
  Filled 2022-03-08: qty 30, 30d supply, fill #0
  Filled 2022-08-02 (×2): qty 30, 30d supply, fill #1
  Filled 2022-08-28: qty 30, 30d supply, fill #2

## 2022-03-08 MED ORDER — OXYCODONE HCL 10 MG PO TABS
10.0000 mg | ORAL_TABLET | Freq: Three times a day (TID) | ORAL | 0 refills | Status: DC | PRN
  Filled 2022-03-08: qty 90, 30d supply, fill #0

## 2022-03-08 MED ORDER — METHOCARBAMOL 500 MG PO TABS
500.0000 mg | ORAL_TABLET | Freq: Four times a day (QID) | ORAL | 1 refills | Status: DC | PRN
  Filled 2022-03-08: qty 120, 30d supply, fill #0
  Filled 2022-04-07 – 2022-04-18 (×2): qty 120, 30d supply, fill #1

## 2022-03-08 MED ORDER — BENZONATATE 200 MG PO CAPS
200.0000 mg | ORAL_CAPSULE | Freq: Three times a day (TID) | ORAL | 0 refills | Status: DC | PRN
  Filled 2022-03-08: qty 20, 7d supply, fill #0

## 2022-03-08 MED ORDER — ALBUTEROL SULFATE HFA 108 (90 BASE) MCG/ACT IN AERS
2.0000 | INHALATION_SPRAY | RESPIRATORY_TRACT | 1 refills | Status: DC | PRN
  Filled 2022-03-08: qty 6.7, 17d supply, fill #0

## 2022-03-08 MED ORDER — TRAZODONE HCL 50 MG PO TABS
50.0000 mg | ORAL_TABLET | Freq: Every evening | ORAL | 3 refills | Status: DC
  Filled 2022-03-08: qty 180, 90d supply, fill #0
  Filled 2022-06-04 – 2022-08-02 (×5): qty 180, 90d supply, fill #1
  Filled 2022-08-28: qty 180, 90d supply, fill #2

## 2022-03-09 ENCOUNTER — Other Ambulatory Visit: Payer: Self-pay | Admitting: Family Medicine

## 2022-03-10 ENCOUNTER — Other Ambulatory Visit (HOSPITAL_COMMUNITY): Payer: Self-pay

## 2022-03-14 DIAGNOSIS — G4733 Obstructive sleep apnea (adult) (pediatric): Secondary | ICD-10-CM | POA: Diagnosis not present

## 2022-03-16 NOTE — Progress Notes (Deleted)
  [place] [facility] [facility] [place] Phone: [phone] Subjective:    I'm seeing this patient by the request  of:  Harrison Mons, PA  CC:   [id number]  Frank [name] is a 32 y.o. male coming in with complaint of back and neck pain. OTM 03/01/2022. Patient states   Medications patient has been prescribed: Cymbalta, Synthroid, Trazadone  Taking:         Reviewed prior external information including notes and imaging from previsou exam, outside providers and external EMR if available.   As well as notes that were available from care everywhere and other healthcare systems.  Past medical history, social, surgical and family history all reviewed in electronic medical record.  No pertanent information unless stated regarding to the chief complaint.   Past Medical History:  Diagnosis Date   ADD (attention deficit disorder)    Anxiety    Narcolepsy through school    No Known Allergies   Review of Systems:  No headache, visual changes, nausea, vomiting, diarrhea, constipation, dizziness, abdominal pain, skin rash, fevers, chills, night sweats, weight loss, swollen lymph nodes, body aches, joint swelling, chest pain, shortness of breath, mood changes. POSITIVE muscle aches  Objective  There were no vitals taken for this visit.   General: No apparent distress alert and oriented x3 mood and affect normal, dressed appropriately.  HEENT: Pupils equal, extraocular movements intact  Respiratory: Patient's speak in full sentences and does not appear short of breath  Cardiovascular: No lower extremity edema, non tender, no erythema  Gait MSK:  Back   Osteopathic findings  C2 flexed rotated and side bent right C6 flexed rotated and side bent left T3 extended rotated and side bent right inhaled rib T9 extended rotated and side bent left L2 flexed rotated and side bent right Sacrum right on right       Assessment and  Plan:  No problem-specific Assessment & Plan notes found for this encounter.    Nonallopathic problems  Decision today to treat with OMT was based on Physical Exam  After verbal consent patient was treated with HVLA, ME, FPR techniques in cervical, rib, thoracic, lumbar, and sacral  areas  Patient tolerated the procedure well with improvement in symptoms  Patient given exercises, stretches and lifestyle modifications  See medications in patient instructions if given  Patient will follow up in 4-8 weeks             Note: This dictation was prepared with Dragon dictation along with smaller phrase technology. Any transcriptional errors that result from this process are unintentional.

## 2022-03-17 DIAGNOSIS — G894 Chronic pain syndrome: Secondary | ICD-10-CM | POA: Diagnosis not present

## 2022-03-17 DIAGNOSIS — F988 Other specified behavioral and emotional disorders with onset usually occurring in childhood and adolescence: Secondary | ICD-10-CM | POA: Diagnosis not present

## 2022-03-17 DIAGNOSIS — F4321 Adjustment disorder with depressed mood: Secondary | ICD-10-CM | POA: Diagnosis not present

## 2022-03-22 ENCOUNTER — Ambulatory Visit: Payer: BC Managed Care – PPO | Admitting: Family Medicine

## 2022-03-23 NOTE — Progress Notes (Signed)
Frank [name] [address] [place] Phone: [phone] Subjective:   Frank [name], am serving as a scribe for Dr. Hulan Saas.  I'm seeing this patient by the request  of:  Harrison Mons, PA  CC: Back and neck pain follow-up  [id number]  Frank [name] is a 32 y.o. male coming in with complaint of back and neck pain. OMT 03/01/2022. Patient states doing well. Ready for next epidural. No new issues.  Has been having more instability of the left leg again.  Medications patient has been prescribed: Cymbalta, Synthroid, Trazadone  Taking:         Reviewed prior external information including notes and imaging from previsou exam, outside providers and external EMR if available.   As well as notes that were available from care everywhere and other healthcare systems.  Past medical history, social, surgical and family history all reviewed in electronic medical record.  No pertanent information unless stated regarding to the chief complaint.   Past Medical History:  Diagnosis Date   ADD (attention deficit disorder)    Anxiety    Narcolepsy through school    No Known Allergies   Review of Systems:  No headache, visual changes, nausea, vomiting, diarrhea, constipation, dizziness, abdominal pain, skin rash, fevers, chills, night sweats, weight loss, swollen lymph nodes, body aches, joint swelling, chest pain, shortness of breath, mood changes. POSITIVE muscle aches  Objective  Blood pressure (!) 136/94, pulse 90, height 6' (1.829 m), weight 295 lb (133.8 kg), SpO2 98 %.   General: No apparent distress alert and oriented x3 mood and affect normal, dressed appropriately.  HEENT: Pupils equal, extraocular movements intact  Respiratory: Patient's speak in full sentences and does not appear short of breath  Cardiovascular: No lower extremity edema, non tender, no erythema  Low back exam does have some loss of lordosis.  Some  tenderness to palpation over the paraspinal musculature.  Patient does have tightness with straight leg test on the left side.  Strength 4 out of 5 with dorsiflexion compared to the contralateral side.  Deep tendon reflexes are intact  Osteopathic findings  C3 flexed rotated and side bent right C6 flexed rotated and side bent left T3 extended rotated and side bent right inhaled rib T9 extended rotated and side bent left L3 flexed rotated and side bent right L5 flexed rotated and side bent left Sacrum right on right       Assessment and Plan:  Acute left lumbar radiculopathy Patient does have a positive straight leg test again noted today.  We discussed with patient that with this going to be his ninth epidural in the last 3-1/2 years we are concerning over it.  Patient feels though that this last 1 did last a lot longer and would like to try another 1 before we talk about surgical intervention.  Will order 1 with this being nearly 4 months since the last injection.  Depending on how patient responds we will need to discuss next treatment options otherwise.  Follow-up again in 4 to 6 weeks.  Patient had more tightness at the end of the exam as well so Toradol and Depo-Medrol given today.    Nonallopathic problems  Decision today to treat with OMT was based on Physical Exam  After verbal consent patient was treated with HVLA, ME, FPR techniques in cervical, rib, thoracic, lumbar, and sacral  areas  Patient tolerated the procedure well with improvement in symptoms  Patient given  exercises, stretches and lifestyle modifications  See medications in patient instructions if given  Patient will follow up in 4-8 weeks    The above documentation has been reviewed and is accurate and complete Frank Pulley, DO          Note: This dictation was prepared with Dragon dictation along with smaller phrase technology. Any transcriptional errors that result from this process are  unintentional.

## 2022-03-27 ENCOUNTER — Ambulatory Visit: Payer: BC Managed Care – PPO | Admitting: Family Medicine

## 2022-03-27 VITALS — BP 136/94 | HR 90 | Ht 72.0 in | Wt 295.0 lb

## 2022-03-27 DIAGNOSIS — M9908 Segmental and somatic dysfunction of rib cage: Secondary | ICD-10-CM

## 2022-03-27 DIAGNOSIS — M5416 Radiculopathy, lumbar region: Secondary | ICD-10-CM

## 2022-03-27 DIAGNOSIS — M9904 Segmental and somatic dysfunction of sacral region: Secondary | ICD-10-CM | POA: Diagnosis not present

## 2022-03-27 DIAGNOSIS — M9901 Segmental and somatic dysfunction of cervical region: Secondary | ICD-10-CM | POA: Diagnosis not present

## 2022-03-27 DIAGNOSIS — M9903 Segmental and somatic dysfunction of lumbar region: Secondary | ICD-10-CM | POA: Diagnosis not present

## 2022-03-27 DIAGNOSIS — M545 Low back pain, unspecified: Secondary | ICD-10-CM

## 2022-03-27 DIAGNOSIS — M9902 Segmental and somatic dysfunction of thoracic region: Secondary | ICD-10-CM

## 2022-03-27 MED ORDER — KETOROLAC TROMETHAMINE 30 MG/ML IJ SOLN
30.0000 mg | Freq: Once | INTRAMUSCULAR | Status: AC
  Administered 2022-03-27: 30 mg via INTRAMUSCULAR

## 2022-03-27 MED ORDER — METHYLPREDNISOLONE ACETATE 40 MG/ML IJ SUSP
40.0000 mg | Freq: Once | INTRAMUSCULAR | Status: AC
  Administered 2022-03-27: 40 mg via INTRAMUSCULAR

## 2022-03-27 MED ORDER — KETOROLAC TROMETHAMINE 30 MG/ML IJ SOLN: 30.0000 mg | Freq: Once | INTRAMUSCULAR | Status: DC

## 2022-03-27 NOTE — Assessment & Plan Note (Addendum)
Patient does have a positive straight leg test again noted today.  We discussed with patient that with this going to be his ninth epidural in the last 3-1/2 years we are concerning over it.  Patient feels though that this last 1 did last a lot longer and would like to try another 1 before we talk about surgical intervention.  Will order 1 with this being nearly 4 months since the last injection.  Depending on how patient responds we will need to discuss next treatment options otherwise.  Follow-up again in 4 to 6 weeks.  Patient had more tightness at the end of the exam as well so Toradol and Depo-Medrol given today.

## 2022-03-27 NOTE — Patient Instructions (Signed)
Injection today See you again in 4-6 weeks

## 2022-03-30 IMAGING — MR MR THORACIC SPINE W/O CM
6 of 7 series · 33 of 48 positions shown · non-contrast
Comparison: Lumbar MRI from the same day reported separately.
Outside thoracic spine MRI 08/27/2014. Thoracic CT myelogram
12/25/2014.

CLINICAL DATA: 30-year-old male with chronic thoracolumbar pain. No
known injury.

EXAM:
MRI THORACIC SPINE WITHOUT CONTRAST
TECHNIQUE: Multiplanar, multisequence MR imaging of the thoracic spine was
performed. No intravenous contrast was administered.

[Series 3: T2 · sagittal · 5.0mm · 1.76mm/px · 6 of 22 slices shown (1 of 3)]
[im 1/22]
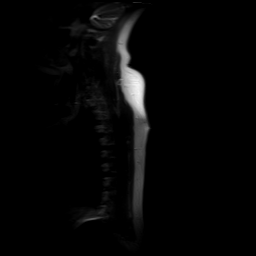
[im 5/22]
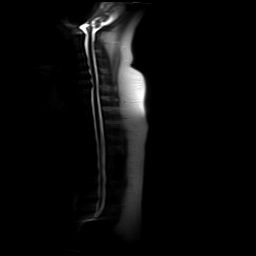
[im 9/22]
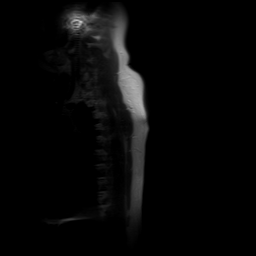
[im 13/22]
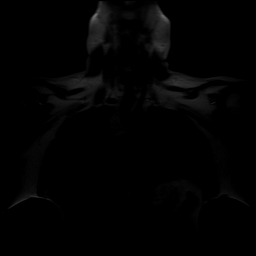
[im 17/22]
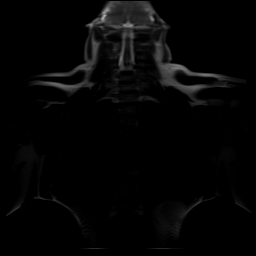
[im 22/22]
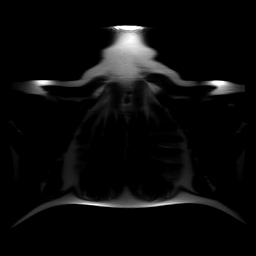

[Series 4: T2 · sagittal · 5.0mm · 1.64mm/px · 7 of 29 slices shown (2 of 3)]
[im 1/29]
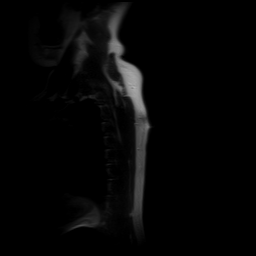
[im 5/29]
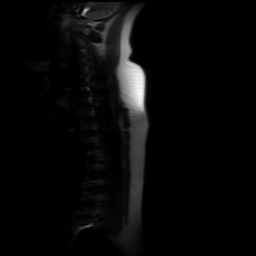
[im 10/29]
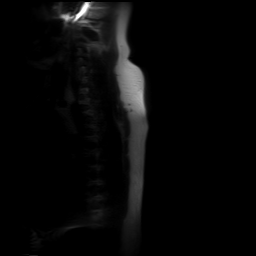
[im 15/29]
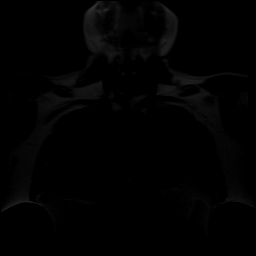
[im 19/29]
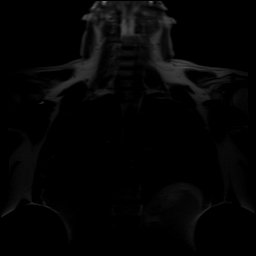
[im 24/29]
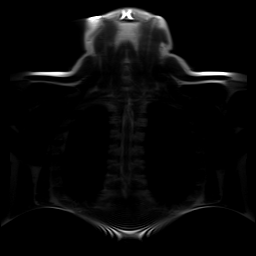
[im 29/29]
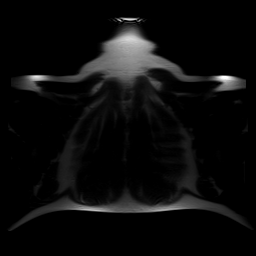

[Series 6: STIR · sagittal · 3.0mm · 1.37mm/px · 5 of 22 slices shown (1 of 2)]
[im 1/22]
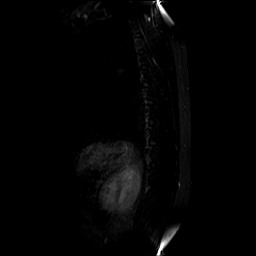
[im 6/22]
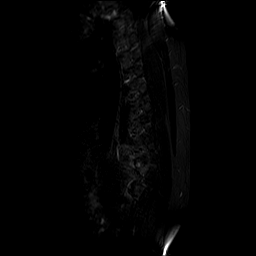
[im 11/22]
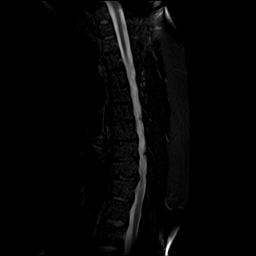
[im 16/22]
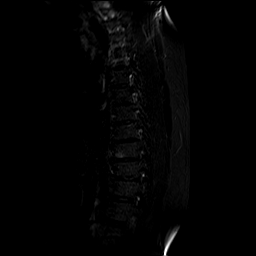
[im 22/22]
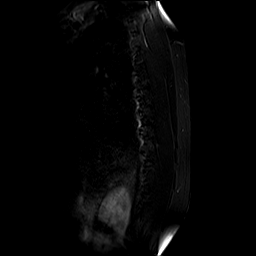

[Series 7: T1 · sagittal · 3.0mm · 0.68mm/px · 5 of 22 slices shown]
[im 1/22]
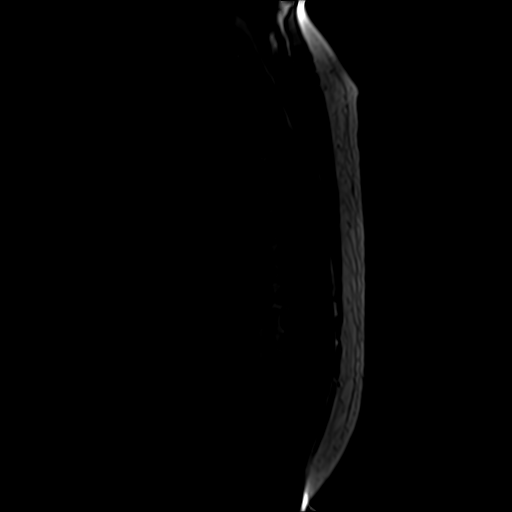
[im 6/22]
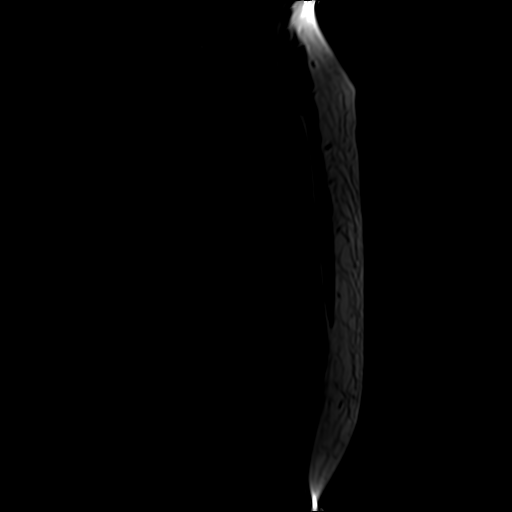
[im 11/22]
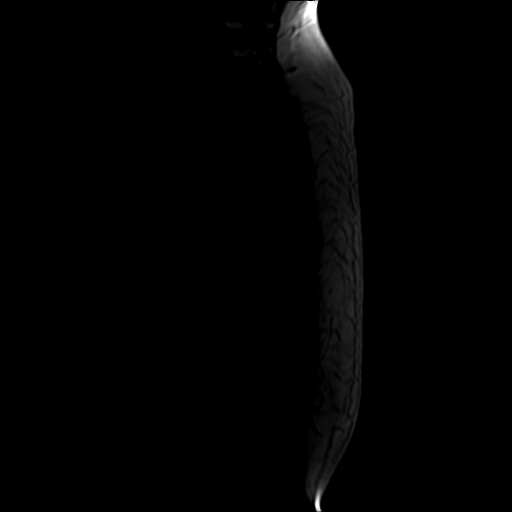
[im 16/22]
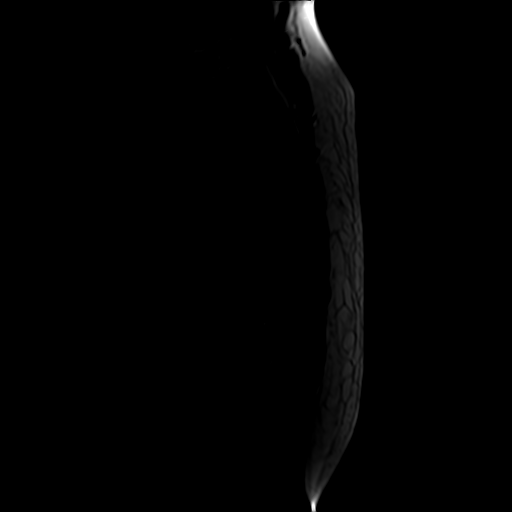
[im 22/22]
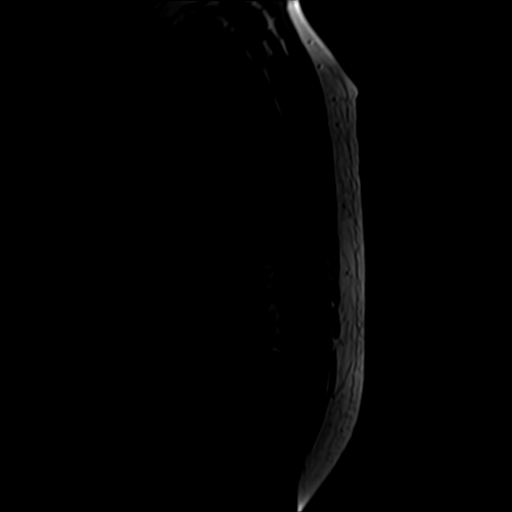

[Series 8: STIR · sagittal · 3.0mm · 1.37mm/px · 2 of 22 slices shown (2 of 2)]
[im 1/22]
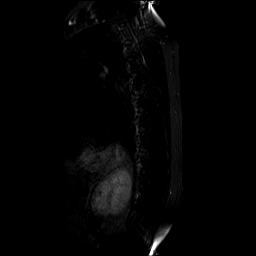
[im 6/22]
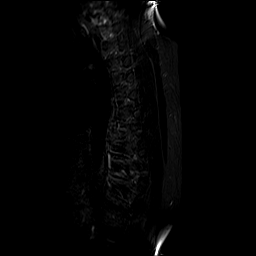

[Series 9: T2 · axial · 4.0mm · 0.39mm/px · z∈[-307,-25]mm · 8 of 39 slices shown (3 of 3)]
[im 1/39]
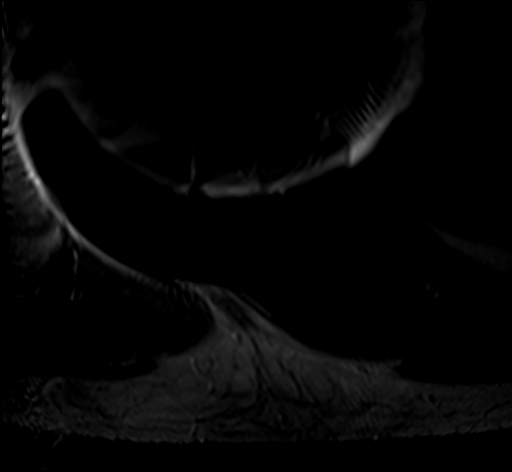
[im 5/39]
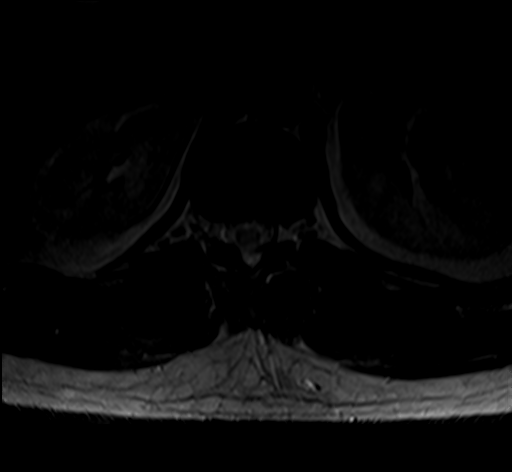
[im 13/39]
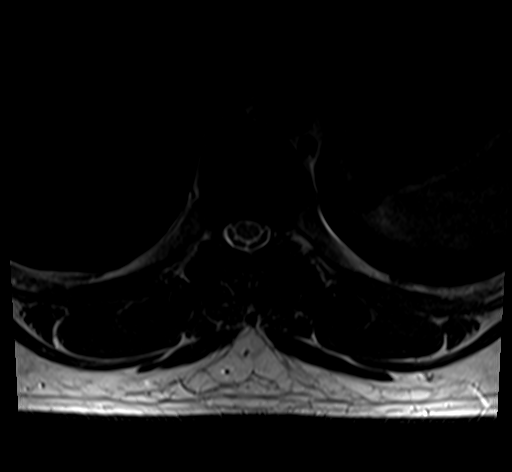
[im 17/39]
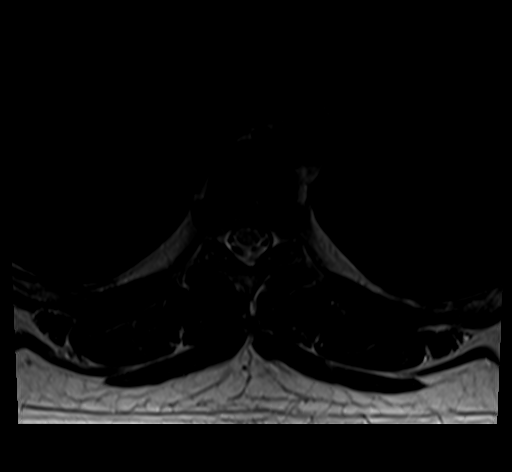
[im 22/39]
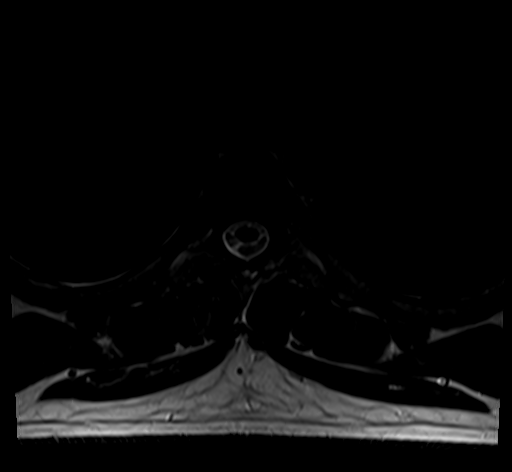
[im 26/39]
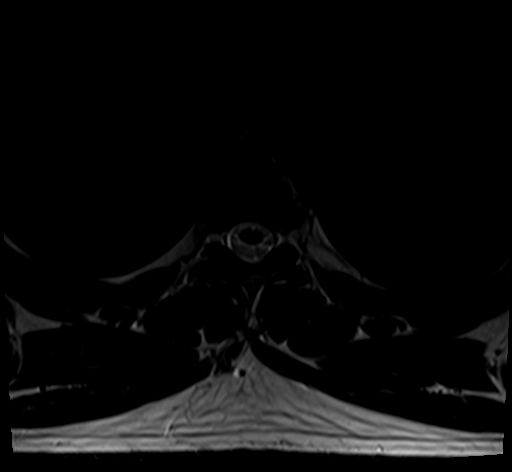
[im 34/39]
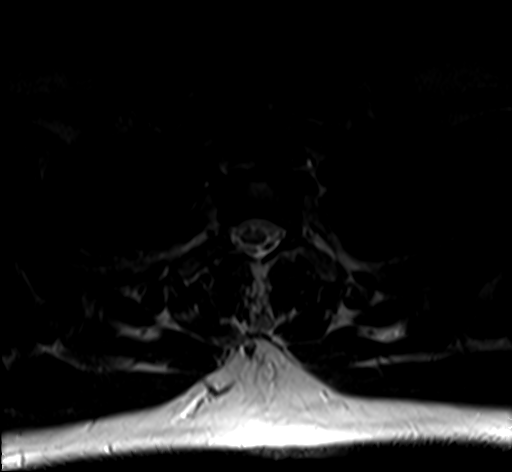
[im 39/39]
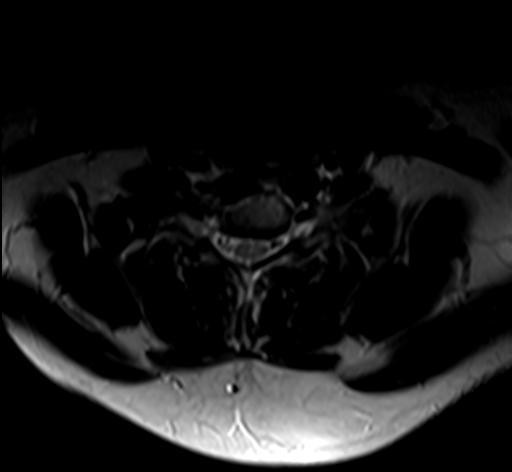

[33 of 48 positions shown; findings below may reference images not displayed]

FINDINGS: Accidentally, sagittal T2 imaging was not acquired.

Limited cervical spine imaging: Evidence of multilevel cervical disc
degeneration and disc bulging or protrusion C4-C5, C5-C6 and C6-C7
on series 2, image 8.

Thoracic spine segmentation: Hypoplastic ribs at T12 on the 4226
myelogram. Otherwise normal.

Alignment: Chronic straightening of thoracic kyphosis is stable
since 4226. No spondylolisthesis.

Vertebrae: Chronic thoracic endplate degeneration from T6 through
T11 appears stable since the 4226 CT myelogram. Normal background
bone marrow signal. Faint degenerative appearing marrow edema at the
right anterior inferior T9 endplate (series 8, image 9), and at the
anterior right T11 endplate. No other acute osseous abnormality
identified.

Cord: Thoracic spinal cord signal and morphology appears stable
since the 4226 MRI and within normal limits. Conus medullaris is at
T12.

Paraspinal and other soft tissues: Negative.

Disc levels:

T1-T2: Negative.

T2-T3: Negative.

T3-T4: Negative.

T4-T5: Negative.

T5-T6: Negative.

T6-T7: Mild disc space loss but no disc bulge or protrusion. Mild to
moderate facet hypertrophy but no stenosis.

T7-T8: No disc bulge or protrusion. Mild to moderate facet
hypertrophy without stenosis.

T8-T9: Mild disc bulging eccentric to the left.  No stenosis.

T9-T10: Mild disc bulging. Moderate facet and ligament flavum
hypertrophy. No spinal stenosis. Mild right and mild to moderate
left T9 foraminal stenosis.

T10-T11: Mild broad-based posterior disc bulging. Mild to moderate
facet hypertrophy. No stenosis.

T11-T12: Negative.

T12-L1: Negative.
IMPRESSION: 1. Chronic thoracic vertebral endplate degeneration from T6 through
T11, stable from a 4226 CT myelogram.
Mild associated disc bulging, and mild to moderate associated facet
hypertrophy.
No thoracic spinal stenosis. Chronic mild to moderate left T9 neural
foraminal stenosis.

2. Evidence of age advanced disc degeneration in the cervical spine.

## 2022-03-31 DIAGNOSIS — H6501 Acute serous otitis media, right ear: Secondary | ICD-10-CM | POA: Diagnosis not present

## 2022-03-31 DIAGNOSIS — H6991 Unspecified Eustachian tube disorder, right ear: Secondary | ICD-10-CM | POA: Diagnosis not present

## 2022-03-31 DIAGNOSIS — J3489 Other specified disorders of nose and nasal sinuses: Secondary | ICD-10-CM | POA: Diagnosis not present

## 2022-03-31 DIAGNOSIS — H9193 Unspecified hearing loss, bilateral: Secondary | ICD-10-CM | POA: Diagnosis not present

## 2022-04-03 DIAGNOSIS — H903 Sensorineural hearing loss, bilateral: Secondary | ICD-10-CM | POA: Diagnosis not present

## 2022-04-03 DIAGNOSIS — J309 Allergic rhinitis, unspecified: Secondary | ICD-10-CM | POA: Diagnosis not present

## 2022-04-03 DIAGNOSIS — H93291 Other abnormal auditory perceptions, right ear: Secondary | ICD-10-CM | POA: Diagnosis not present

## 2022-04-03 DIAGNOSIS — J3489 Other specified disorders of nose and nasal sinuses: Secondary | ICD-10-CM | POA: Diagnosis not present

## 2022-04-04 ENCOUNTER — Other Ambulatory Visit: Payer: Self-pay | Admitting: Otolaryngology

## 2022-04-04 DIAGNOSIS — H9011 Conductive hearing loss, unilateral, right ear, with unrestricted hearing on the contralateral side: Secondary | ICD-10-CM

## 2022-04-06 ENCOUNTER — Other Ambulatory Visit (HOSPITAL_COMMUNITY): Payer: Self-pay

## 2022-04-07 ENCOUNTER — Other Ambulatory Visit (HOSPITAL_COMMUNITY): Payer: Self-pay

## 2022-04-07 MED ORDER — LEVOTHYROXINE SODIUM 25 MCG PO TABS
25.0000 ug | ORAL_TABLET | Freq: Every day | ORAL | 1 refills | Status: DC
  Filled 2022-04-07 (×2): qty 90, 90d supply, fill #0

## 2022-04-07 MED ORDER — OXYCODONE HCL 10 MG PO TABS
10.0000 mg | ORAL_TABLET | Freq: Three times a day (TID) | ORAL | 0 refills | Status: DC | PRN
  Filled 2022-04-07: qty 90, 30d supply, fill #0

## 2022-04-07 MED ORDER — BENZONATATE 200 MG PO CAPS
200.0000 mg | ORAL_CAPSULE | Freq: Three times a day (TID) | ORAL | 0 refills | Status: DC | PRN
  Filled 2022-04-07: qty 20, 7d supply, fill #0

## 2022-04-07 MED ORDER — AMPHETAMINE-DEXTROAMPHETAMINE 30 MG PO TABS
30.0000 mg | ORAL_TABLET | Freq: Two times a day (BID) | ORAL | 0 refills | Status: DC
  Filled 2022-04-07: qty 75, 30d supply, fill #0

## 2022-04-07 MED ORDER — ALBUTEROL SULFATE HFA 108 (90 BASE) MCG/ACT IN AERS
2.0000 | INHALATION_SPRAY | RESPIRATORY_TRACT | 1 refills | Status: DC | PRN
  Filled 2022-04-07: qty 6.7, 16d supply, fill #0

## 2022-04-10 ENCOUNTER — Ambulatory Visit
Admission: RE | Admit: 2022-04-10 | Discharge: 2022-04-10 | Disposition: A | Discharge status: Home / Self Care | Payer: BC Managed Care – PPO | Source: Ambulatory Visit | Attending: Otolaryngology | Admitting: Otolaryngology

## 2022-04-10 DIAGNOSIS — H9011 Conductive hearing loss, unilateral, right ear, with unrestricted hearing on the contralateral side: Secondary | ICD-10-CM | POA: Diagnosis not present

## 2022-04-10 DIAGNOSIS — J3489 Other specified disorders of nose and nasal sinuses: Secondary | ICD-10-CM | POA: Diagnosis not present

## 2022-04-11 ENCOUNTER — Encounter: Payer: Self-pay | Admitting: Family Medicine

## 2022-04-11 ENCOUNTER — Ambulatory Visit: Payer: BC Managed Care – PPO | Admitting: Family Medicine

## 2022-04-11 ENCOUNTER — Ambulatory Visit (INDEPENDENT_AMBULATORY_CARE_PROVIDER_SITE_OTHER): Payer: BC Managed Care – PPO

## 2022-04-11 VITALS — BP 138/98 | HR 76 | Ht 72.0 in | Wt 295.0 lb

## 2022-04-11 DIAGNOSIS — M9902 Segmental and somatic dysfunction of thoracic region: Secondary | ICD-10-CM

## 2022-04-11 DIAGNOSIS — R0789 Other chest pain: Secondary | ICD-10-CM

## 2022-04-11 DIAGNOSIS — M9903 Segmental and somatic dysfunction of lumbar region: Secondary | ICD-10-CM | POA: Diagnosis not present

## 2022-04-11 DIAGNOSIS — M9904 Segmental and somatic dysfunction of sacral region: Secondary | ICD-10-CM

## 2022-04-11 DIAGNOSIS — M5416 Radiculopathy, lumbar region: Secondary | ICD-10-CM

## 2022-04-11 DIAGNOSIS — M5134 Other intervertebral disc degeneration, thoracic region: Secondary | ICD-10-CM | POA: Diagnosis not present

## 2022-04-11 DIAGNOSIS — M9908 Segmental and somatic dysfunction of rib cage: Secondary | ICD-10-CM

## 2022-04-11 DIAGNOSIS — M9901 Segmental and somatic dysfunction of cervical region: Secondary | ICD-10-CM | POA: Diagnosis not present

## 2022-04-11 MED ORDER — KETOROLAC TROMETHAMINE 60 MG/2ML IM SOLN
60.0000 mg | Freq: Once | INTRAMUSCULAR | Status: AC
  Administered 2022-04-11: 60 mg via INTRAMUSCULAR

## 2022-04-11 MED ORDER — METHYLPREDNISOLONE ACETATE 40 MG/ML IJ SUSP
80.0000 mg | Freq: Once | INTRAMUSCULAR | Status: AC
  Administered 2022-04-11: 80 mg via INTRAMUSCULAR

## 2022-04-11 NOTE — Assessment & Plan Note (Signed)
Degenerative disc disease as well as some mild increase in kyphosis.  Patient does have difficulty with this for quite some time.  Has responded well to the epidurals of the lumbar spine previously.  Toradol and Depo-Medrol given again today. This seems to be more of a chronic pain syndrome that did seem to get exacerbated.  Has been doing different activities such as playing in a children's playground more often.  Follow-up with me again in 4 to 6 weeks

## 2022-04-11 NOTE — Patient Instructions (Signed)
Epidural [phone] Chest xray on your way out

## 2022-04-11 NOTE — Progress Notes (Signed)
[place] [employer] Phone: [phone] Subjective:   Frank [name], am serving as a scribe for Dr. Hulan Saas.  I'm seeing this patient by the request  of:  Harrison Mons, PA  CC: Multiple joint complaint  [id number]  Frank [name] is a 32 y.o. male coming in with complaint of back and neck pain. OMT 03/27/2022. Patient states that he is sore and achy since last visit. Notes cramping and spasming. Would like to do epidural.  States that her back pain and knee pain seems to be worse.  Having some of the right wrist pain as well.  Patient feels like the daily aches and pains have been worse recently.  [name] major changes in his back pain.   CT head done yesterday.   Medications patient has been prescribed:   Taking:         Reviewed prior external information including notes and imaging from previsou exam, outside providers and external EMR if available.   As well as notes that were available from care everywhere and other healthcare systems.  Past medical history, social, surgical and family history all reviewed in electronic medical record.  [name] pertanent information unless stated regarding to the chief complaint.   Past Medical History:  Diagnosis Date   ADD (attention deficit disorder)    Anxiety    Narcolepsy through school    [name] Known Allergies   Review of Systems:  [name] headache, visual changes, nausea, vomiting, diarrhea, constipation, dizziness, abdominal pain, skin rash, fevers, chills, night sweats, weight loss, swollen lymph nodes,  chest pain, shortness of breath, mood changes. POSITIVE muscle aches, body aches, joint swelling  Objective  Blood pressure (!) 138/98, pulse 76, height 6' (1.829 m), weight 295 lb (133.8 kg), SpO2 97 %.   General: [name] apparent distress alert and oriented x3 mood and affect normal, dressed appropriately.  HEENT: Pupils equal, extraocular movements intact   Respiratory: Patient's speak in full sentences and does not appear short of breath  Cardiovascular: [name] lower extremity edema, non tender, [name] erythema  Low back exam does have some loss of lordosis.  Patient does have significant tightness in the thoracolumbar junction.  Patient does have tightness noted in the paraspinal musculature of the lumbar spine.  Patient does seem to have some mild difficulty with deep inspiration.  He had more pain on the right side. Out of proportion to amount of palpation continued right wrist pain also noted.  Osteopathic findings  C3 flexed rotated and side bent right C5 flexed rotated and side bent left T3 extended rotated and side bent right inhaled rib T9 extended rotated and side bent left L2 flexed rotated and side bent right Sacrum right on right       Assessment and Plan:  Chest wall pain Will get chest x-ray to make sure there is [name] small pleural effusion or pulmonary etiology that could be contributing.  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  Patient does have difficulty with this for quite some time.  Has responded well to the epidurals of the lumbar spine previously.  Toradol and Depo-Medrol given again today. This seems to be more of a chronic pain syndrome that did seem to get exacerbated.  Has been doing different activities such as playing in a children's playground more often.  Follow-up with me again in 4 to 6 weeks  Nonallopathic problems  Decision today to treat with OMT was based on Physical Exam  After verbal consent patient was treated with HVLA, ME, FPR techniques in cervical, rib, thoracic, lumbar, and sacral  areas  Patient tolerated the procedure well with improvement in symptoms  Patient given exercises, stretches and lifestyle modifications  See medications in patient instructions if given  Patient will follow up in 4-8 weeks     The above documentation has been reviewed and is accurate and complete Frank Pulley, DO          Note: This dictation was prepared with Dragon dictation along with smaller phrase technology. Any transcriptional errors that result from this process are unintentional.

## 2022-04-11 NOTE — Assessment & Plan Note (Signed)
Will get chest x-ray to make sure there is no small pleural effusion or pulmonary etiology that could be contributing.

## 2022-04-12 ENCOUNTER — Ambulatory Visit: Payer: BC Managed Care – PPO | Admitting: Family Medicine

## 2022-04-13 DIAGNOSIS — G4733 Obstructive sleep apnea (adult) (pediatric): Secondary | ICD-10-CM | POA: Diagnosis not present

## 2022-04-14 ENCOUNTER — Other Ambulatory Visit (HOSPITAL_COMMUNITY): Payer: Self-pay

## 2022-04-18 ENCOUNTER — Ambulatory Visit
Admission: RE | Admit: 2022-04-18 | Discharge: 2022-04-18 | Disposition: A | Discharge status: Home / Self Care | Payer: BC Managed Care – PPO | Source: Ambulatory Visit | Attending: Family Medicine | Admitting: Family Medicine

## 2022-04-18 ENCOUNTER — Other Ambulatory Visit: Payer: BC Managed Care – PPO

## 2022-04-18 ENCOUNTER — Other Ambulatory Visit (HOSPITAL_COMMUNITY): Payer: Self-pay

## 2022-04-18 DIAGNOSIS — M5116 Intervertebral disc disorders with radiculopathy, lumbar region: Secondary | ICD-10-CM | POA: Diagnosis not present

## 2022-04-18 DIAGNOSIS — M47817 Spondylosis without myelopathy or radiculopathy, lumbosacral region: Secondary | ICD-10-CM | POA: Diagnosis not present

## 2022-04-18 DIAGNOSIS — M5416 Radiculopathy, lumbar region: Secondary | ICD-10-CM

## 2022-04-18 MED ORDER — METHYLPREDNISOLONE ACETATE 40 MG/ML INJ SUSP (RADIOLOG
80.0000 mg | Freq: Once | INTRAMUSCULAR | Status: AC
  Administered 2022-04-18: 80 mg via EPIDURAL

## 2022-04-18 MED ORDER — IOPAMIDOL (ISOVUE-M 200) INJECTION 41%
1.0000 mL | Freq: Once | INTRAMUSCULAR | Status: AC
  Administered 2022-04-18: 1 mL via EPIDURAL

## 2022-04-18 NOTE — Discharge Instructions (Signed)

## 2022-04-24 ENCOUNTER — Other Ambulatory Visit: Payer: Self-pay | Admitting: Otolaryngology

## 2022-04-24 DIAGNOSIS — H905 Unspecified sensorineural hearing loss: Secondary | ICD-10-CM

## 2022-04-24 DIAGNOSIS — H93291 Other abnormal auditory perceptions, right ear: Secondary | ICD-10-CM

## 2022-04-27 ENCOUNTER — Ambulatory Visit
Admission: RE | Admit: 2022-04-27 | Discharge: 2022-04-27 | Disposition: A | Discharge status: Home / Self Care | Payer: BC Managed Care – PPO | Source: Ambulatory Visit | Attending: Otolaryngology | Admitting: Otolaryngology

## 2022-04-27 DIAGNOSIS — H9011 Conductive hearing loss, unilateral, right ear, with unrestricted hearing on the contralateral side: Secondary | ICD-10-CM | POA: Diagnosis not present

## 2022-04-27 DIAGNOSIS — H905 Unspecified sensorineural hearing loss: Secondary | ICD-10-CM

## 2022-04-27 DIAGNOSIS — H93291 Other abnormal auditory perceptions, right ear: Secondary | ICD-10-CM

## 2022-04-27 MED ORDER — GADOPICLENOL 0.5 MMOL/ML IV SOLN
10.0000 mL | Freq: Once | INTRAVENOUS | Status: AC | PRN
  Administered 2022-04-27: 10 mL via INTRAVENOUS

## 2022-05-01 NOTE — Progress Notes (Signed)
[place] Phone: [phone] Subjective:   [name], am serving as a scribe for Dr. Hulan Saas.  I'm seeing this patient by the request  of:  Harrison Mons, PA  CC: Neck and back pain follow-up  [id number]  Frank [name] is a 32 y.o. male coming in with complaint of back and neck pain. OMT 04/11/2022. Patient states that he had epidural which was helpful.  States that the lower back pain is significantly improved at this time.  Was sick last week and still has pain in R side of cervical spine from being more sedentary.   Medications patient has been prescribed: Cymbalta  Taking:         Reviewed prior external information including notes and imaging from previsou exam, outside providers and external EMR if available.   As well as notes that were available from care everywhere and other healthcare systems.  Past medical history, social, surgical and family history all reviewed in electronic medical record.  No pertanent information unless stated regarding to the chief complaint.   Past Medical History:  Diagnosis Date   ADD (attention deficit disorder)    Anxiety    Narcolepsy through school    Allergies  Allergen Reactions   Vueway [Gadopiclenol] Nausea Only     Review of Systems:  No headache, visual changes, nausea, vomiting, diarrhea, constipation, dizziness, abdominal pain, skin rash, fevers, chills, night sweats, weight loss, swollen lymph nodes, body aches, joint swelling, chest pain, shortness of breath, mood changes. POSITIVE muscle aches  Objective  Blood pressure (!) 120/92, pulse 77, height 6' (1.829 m), weight (!) 302 lb (137 kg), SpO2 98 %.   General: No apparent distress alert and oriented x3 mood and affect normal, dressed appropriately.  HEENT: Pupils equal, extraocular movements intact  Respiratory: Patient's speak in full sentences and does not appear short  of breath  Cardiovascular: No lower extremity edema, non tender, no erythema  Neck exam does have some tightness noted.  Seems to be more tight on the right than the left.  Seems to be more severe than usual.  Negative Spurling sign noted.  5 out of 5 strength of the upper extremities.  Does have some tenderness in the right wrist with.  No significant swelling though noted.  Osteopathic findings  C2 flexed rotated and side bent right C6 flexed rotated and side bent left T3 extended rotated and side bent right inhaled rib T9 extended rotated and side bent left L2 flexed rotated and side bent right Sacrum right on right       Assessment and Plan:  Trigger point of neck Has had trigger points of the neck previously and seems to have some of the sternocleidomastoid.  Recently did have an illness where patient did have some vomiting.  Will get x-rays though to see if any of his increasing in discomfort as well as chronic sinusitis as anything that is causing more tightness in this area.  Patient recently did have a CT scan of the sinuses that did show an opacification of the frontal and mastoid on the right side.  Patient is following up with an ENT as well.  Held on any injections today because patient did likely respond well to a myofascial release in this area.  Follow-up again in 6 to 8 weeks  Myofascial pain Continue the Cymbalta, seems to have some mild aggravation noted secondary to the discomfort and pain.  Degeneration of intervertebral disc of thoracic region Known tightness in the area.  Discussed posture and ergonomics, discussed core strengthening.  Patient is still having intermittent difficulties with weight we will continue to work on core strength and back strengthening.    Nonallopathic problems  Decision today to treat with OMT was based on Physical Exam  After verbal consent patient was treated with HVLA, ME, FPR techniques in cervical, rib, thoracic, lumbar, and  sacral  areas  Patient tolerated the procedure well with improvement in symptoms  Patient given exercises, stretches and lifestyle modifications  See medications in patient instructions if given  Patient will follow up in 4-8 weeks     The above documentation has been reviewed and is accurate and complete Lyndal Pulley, DO         Note: This dictation was prepared with Dragon dictation along with smaller phrase technology. Any transcriptional errors that result from this process are unintentional.

## 2022-05-03 ENCOUNTER — Ambulatory Visit (INDEPENDENT_AMBULATORY_CARE_PROVIDER_SITE_OTHER): Payer: BC Managed Care – PPO

## 2022-05-03 ENCOUNTER — Encounter: Payer: Self-pay | Admitting: Family Medicine

## 2022-05-03 ENCOUNTER — Other Ambulatory Visit (HOSPITAL_COMMUNITY): Payer: Self-pay

## 2022-05-03 ENCOUNTER — Ambulatory Visit (INDEPENDENT_AMBULATORY_CARE_PROVIDER_SITE_OTHER): Payer: BC Managed Care – PPO | Admitting: Family Medicine

## 2022-05-03 VITALS — BP 120/92 | HR 77 | Ht 72.0 in | Wt 302.0 lb

## 2022-05-03 DIAGNOSIS — R0789 Other chest pain: Secondary | ICD-10-CM | POA: Diagnosis not present

## 2022-05-03 DIAGNOSIS — M9904 Segmental and somatic dysfunction of sacral region: Secondary | ICD-10-CM

## 2022-05-03 DIAGNOSIS — M9903 Segmental and somatic dysfunction of lumbar region: Secondary | ICD-10-CM

## 2022-05-03 DIAGNOSIS — M7918 Myalgia, other site: Secondary | ICD-10-CM | POA: Diagnosis not present

## 2022-05-03 DIAGNOSIS — M5134 Other intervertebral disc degeneration, thoracic region: Secondary | ICD-10-CM

## 2022-05-03 DIAGNOSIS — M9901 Segmental and somatic dysfunction of cervical region: Secondary | ICD-10-CM

## 2022-05-03 DIAGNOSIS — M542 Cervicalgia: Secondary | ICD-10-CM

## 2022-05-03 DIAGNOSIS — M9908 Segmental and somatic dysfunction of rib cage: Secondary | ICD-10-CM | POA: Diagnosis not present

## 2022-05-03 DIAGNOSIS — M9902 Segmental and somatic dysfunction of thoracic region: Secondary | ICD-10-CM

## 2022-05-03 MED ORDER — BENZONATATE 200 MG PO CAPS
200.0000 mg | ORAL_CAPSULE | Freq: Three times a day (TID) | ORAL | 0 refills | Status: DC | PRN
  Filled 2022-05-03: qty 20, 7d supply, fill #0

## 2022-05-03 MED ORDER — OXYCODONE HCL 10 MG PO TABS
10.0000 mg | ORAL_TABLET | Freq: Three times a day (TID) | ORAL | 0 refills | Status: DC | PRN
  Filled 2022-05-03 – 2022-05-04 (×2): qty 90, 30d supply, fill #0

## 2022-05-03 MED ORDER — BUPROPION HCL ER (XL) 300 MG PO TB24
300.0000 mg | ORAL_TABLET | Freq: Every day | ORAL | 3 refills | Status: AC
  Filled 2022-05-03 – 2022-08-02 (×5): qty 90, 90d supply, fill #0
  Filled 2022-11-22 – 2023-01-17 (×2): qty 90, 90d supply, fill #1

## 2022-05-03 MED ORDER — LEVOTHYROXINE SODIUM 25 MCG PO TABS
25.0000 ug | ORAL_TABLET | Freq: Every day | ORAL | 1 refills | Status: AC
  Filled 2022-05-03: qty 90, 90d supply, fill #0
  Filled 2022-06-04 – 2023-01-17 (×6): qty 90, 90d supply, fill #1

## 2022-05-03 MED ORDER — AMPHETAMINE-DEXTROAMPHETAMINE 30 MG PO TABS
ORAL_TABLET | ORAL | 0 refills | Status: DC
  Filled 2022-05-04: qty 75, 30d supply, fill #0

## 2022-05-03 MED ORDER — CYCLOBENZAPRINE HCL 5 MG PO TABS
5.0000 mg | ORAL_TABLET | Freq: Two times a day (BID) | ORAL | 0 refills | Status: DC
  Filled 2022-05-03: qty 30, 15d supply, fill #0

## 2022-05-03 MED ORDER — ALBUTEROL SULFATE HFA 108 (90 BASE) MCG/ACT IN AERS
2.0000 | INHALATION_SPRAY | RESPIRATORY_TRACT | 1 refills | Status: DC | PRN
  Filled 2022-05-03: qty 6.7, 16d supply, fill #0
  Filled 2022-06-04 – 2022-07-03 (×2): qty 6.7, 16d supply, fill #1

## 2022-05-03 NOTE — Assessment & Plan Note (Signed)
Continue the Cymbalta, seems to have some mild aggravation noted secondary to the discomfort and pain.

## 2022-05-03 NOTE — Assessment & Plan Note (Signed)
Known tightness in the area.  Discussed posture and ergonomics, discussed core strengthening.  Patient is still having intermittent difficulties with weight we will continue to work on core strength and back strengthening.

## 2022-05-03 NOTE — Patient Instructions (Signed)
Xray today See me as scheduled

## 2022-05-03 NOTE — Assessment & Plan Note (Signed)
Has had trigger points of the neck previously and seems to have some of the sternocleidomastoid.  Recently did have an illness where patient did have some vomiting.  Will get x-rays though to see if any of his increasing in discomfort as well as chronic sinusitis as anything that is causing more tightness in this area.  Patient recently did have a CT scan of the sinuses that did show an opacification of the frontal and mastoid on the right side.  Patient is following up with an ENT as well.  Held on any injections today because patient did likely respond well to a myofascial release in this area.  Follow-up again in 6 to 8 weeks

## 2022-05-04 ENCOUNTER — Other Ambulatory Visit: Payer: Self-pay

## 2022-05-04 ENCOUNTER — Other Ambulatory Visit (HOSPITAL_COMMUNITY): Payer: Self-pay

## 2022-05-12 ENCOUNTER — Ambulatory Visit: Payer: BC Managed Care – PPO | Admitting: Primary Care

## 2022-05-12 VITALS — BP 124/90 | HR 78 | Temp 98.0°F | Ht 72.0 in | Wt 300.0 lb

## 2022-05-12 DIAGNOSIS — J4520 Mild intermittent asthma, uncomplicated: Secondary | ICD-10-CM | POA: Diagnosis not present

## 2022-05-12 DIAGNOSIS — J329 Chronic sinusitis, unspecified: Secondary | ICD-10-CM

## 2022-05-12 DIAGNOSIS — J40 Bronchitis, not specified as acute or chronic: Secondary | ICD-10-CM | POA: Diagnosis not present

## 2022-05-12 DIAGNOSIS — G4733 Obstructive sleep apnea (adult) (pediatric): Secondary | ICD-10-CM | POA: Diagnosis not present

## 2022-05-12 DIAGNOSIS — J45909 Unspecified asthma, uncomplicated: Secondary | ICD-10-CM | POA: Insufficient documentation

## 2022-05-12 MED ORDER — MONTELUKAST SODIUM 10 MG PO TABS: ORAL_TABLET | ORAL | 3 refills | Status: DC

## 2022-05-12 MED ORDER — FLUTICASONE-SALMETEROL 45-21 MCG/ACT IN AERO: 2.0000 | INHALATION_SPRAY | Freq: Two times a day (BID) | RESPIRATORY_TRACT | 3 refills | Status: DC

## 2022-05-12 NOTE — Patient Instructions (Addendum)
Breathing test in January showed normal lung function, you did have some air trapping which you can see with asthma  Recommendations - Start Advair- take two puffs every morning and evening  - Use Albuterol 2 puffs every 4-6 hours for breakthrough shortness of breath/chest tightness only - Continue Astelin/fluticasone nasal spray - Restart Singulair 10 mg at bedtime - Continue to follow with ENT - Continue CPAP nightly 4 to 6 hours or longer  Orders: - Overnight oximetry on CPAP   Follow-up - 4 months with Eustaquio Maize NP   Fluticasone; Salmeterol Metered Dose Inhaler (MDI) What is this medication? FLUTICASONE; SALMETEROL (floo TIK a sone; sal ME te role) treats asthma and chronic obstructive pulmonary disease (COPD). It works by opening the airways of the lungs, making it easier to breathe. It is a combination of an inhaled steroid and a bronchodilator. It is often called a controller inhaler. Do not use it to treat a sudden asthma attack. This medicine may be used for other purposes; ask your health care provider or pharmacist if you have questions. COMMON BRAND NAME(S): Advair HFA What should I tell my care team before I take this medication? They need to know if you have any of these conditions: Diabetes Eye disease, such as glaucoma, cataracts, or blurred vision Heart disease High blood pressure Immune system problems Infection, such as tuberculosis (TB) or other bacterial, fungal, or viral infections Irregular heartbeat or rhythm Liver disease Osteoporosis, weak bones Seizures Taking other steroids, such as dexamethasone or prednisone Thyroid disease An unusual or allergic reaction to fluticasone, salmeterol, other medications, foods, dyes, or preservatives Pregnant or trying to get pregnant Breastfeeding How should I use this medication? This medication is inhaled through the mouth. Rinse your mouth with water after use. Make sure not to swallow the water. Take it as directed on  the prescription label at the same time every day. Do not use it more often than directed. This medication comes with INSTRUCTIONS FOR USE. Ask your pharmacist for directions on how to use this medication. Read the information carefully. Talk to your pharmacist or care team if you have questions. Talk to your care team about the use of this medication in children. While it may be prescribed for children as young as 12 years for selected conditions, precautions do apply. Overdosage: If you think you have taken too much of this medicine contact a poison control center or emergency room at once. NOTE: This medicine is only for you. Do not share this medicine with others. What if I miss a dose? If you miss a dose, use it as soon as you can. If it is almost time for your next dose, use only that dose. Do not use double or extra doses. What may interact with this medication? Do not take this medication with any of the following: MAOIs, such as Carbex, Eldepryl, Marplan, Nardil, and Parnate This medication may also interact with the following: Aminophylline or theophylline Antivirals for HIV or AIDS Beta blockers, such as metoprolol or propranolol Certain antibiotics, such as clarithromycin, erythromycin, levofloxacin, linezolid, and telithromycin Certain medications for fungal infections, such as ketoconazole, itraconazole, posaconazole, voriconazole Conivaptan Diuretics Medications for colds Medications for depression or mental health conditions Nefazodone Vaccines This list may not describe all possible interactions. Give your health care provider a list of all the medicines, herbs, non-prescription drugs, or dietary supplements you use. Also tell them if you smoke, drink alcohol, or use illegal drugs. Some items may interact with your medicine. What  should I watch for while using this medication? Visit your care team for regular checks on your progress. Tell your care team if your symptoms do  not start to get better or if they get worse. Talk to your care team about how to treat an acute asthma attack or bronchospasm (wheezing). Be sure to always have a short-acting inhaler with you. If you use your short-acting inhaler and your symptoms do not get better or if they get worse, call your care team right away. You and your care team should develop an Asthma Action Plan that is just for you. Be sure to know what to do if you are in the yellow (asthma is getting worse) or red (medical alert) zones. This medication can worsen breathing or cause wheezing right after you use it. Be sure you have a short-acting inhaler for acute attacks (wheezing) nearby. If this happens, stop using this medication right away and call your care team. This medication may increase your risk of dying from asthma-related problems. Talk to your care team if you have questions. This medication may increase your risk of getting an infection. Call your care team for advice if you get a fever, chills, sore throat, or other symptoms of a cold or flu. Do not treat yourself. Try to avoid being around people who are sick. If you have not had the measles or chickenpox vaccines, tell your care team right away if you are around someone with these viruses. This medication may slow your child's growth if it is taken for a long time at high doses. Your care team will monitor your child's growth. Using this medication for a long time may weaken your bones. The risk of bone fractures may be increased. Talk to your care team about your bone health. This medication may increase blood sugar. Ask your care team if changes in diet or medications are needed if you have diabetes. Do not treat yourself for coughs, colds or allergies without asking your care team for advice. Some nonprescription medications can affect this one. What side effects may I notice from receiving this medication? Side effects that you should report to your care team as  soon as possible: Allergic reactions--skin rash, itching, hives, swelling of the face, lips, tongue, or throat Flu-like symptoms--fever, chills, muscle pain, cough, headache, fatigue Heart rhythm changes--fast or irregular heartbeat, dizziness, feeling faint or lightheaded, chest pain, trouble breathing Increase in blood pressure Low adrenal gland function--nausea, vomiting, loss of appetite, unusual weakness, fatigue, dizziness Muscle pain or cramps Pain, tingling, or numbness in the hands or feet Sinus pain or pressure around the face or forehead Thrush--white patches in the mouth Wheezing or trouble breathing that is worse after use Side effects that usually do not require medical attention (report to your care team if they continue or are bothersome): Change in taste Cough Dry mouth Headache Hoarseness Sore throat Tremors or shaking Trouble sleeping This list may not describe all possible side effects. Call your doctor for medical advice about side effects. You may report side effects to FDA at 1-800-FDA-1088. Where should I keep my medication? Keep out of the reach of children and pets. Store at room temperature between 20 and 25 degrees C (68 and 77 degrees F). Keep inhaler away from extreme heat. Get rid of it when the dose counter reads "000" or after the expiration date, whichever is first. NOTE: This sheet is a summary. It may not cover all possible information. If you have questions about this  medicine, talk to your doctor, pharmacist, or health care provider.  2023 [employer]vier/Gold Standard (2019-11-27 00:00:00)

## 2022-05-12 NOTE — Progress Notes (Signed)
@Patient  ID: Frank [name], male    DOB: [birth year], 32 y.o.   MRN: [id number]  Chief Complaint  Patient presents with   Follow-up    Referring provider: Harrison Mons, PA  HPI: 32 year old male, never smoked. PMH significant for ADD, OCD, moderate OSA.   Previous [facility] pulmonary encounter:  05/04/2021 Patient presents today for sleep consult. He had sleep study in 2016 that showed mild OSA, AHI was 5.4. He was never started on treatment. He has symptoms of loud snoring, witness apnea, restless sleep and daytime sleepiness. He has a family hx sleep apnea. Wife states that his oxygen will drop into the 60s. Typical bedtime is between 8-11pm. He takes 50mg  trazodone for insomnia at bedtime, he has prn Ambien which he uses very infrequently. He wakes up multiple times at night, previously thought this was due to his chronic back pain. He starts his day at 4:30 or 6am. No morning grogginess but reports daytime sleepiness in the afternoon. Drinking a lot of caffeine to stay awake. He takes adderall 15mg  as needed mostly when having to drive, he does not use every day. He has been having palpitations and dizziness. Denies narcolepsy, cataplexy or sleep walking.   Sleep questionnaire: Symptoms- Loud snoring, waking up gasping for air, daytime sleepiness  Prior sleep study- Yes, early 46s (results available) Bedtime- 8-11pm Time to fall asleep- minutes to hours  Nocturnal awakenings- too many times  Out of bed/start of day- 4:30 or 6am  Weight changes- Fluctuates  Do you operate heavy machinery- No Do you currently wear CPAP-No  Do you current wear oxygen- No  Epworth - 17  06/22/2021 Patient contacted today for follow-up. Accompanied by wife on virtual video visit. He had a home sleep test on 06/03/21 that showed moderate obstructive sleep apnea, AHI 23.4/hr with SpO2 low 70% (average 90%). Reviewed treatment options. His daytime sleepiness has worsened, he would like to try CPAP. He is no  longer taking Ambien to sleep, remains on trazodone 50mg  at bedtime. He is taking Adderall 15mg  TID as needed for ADD symptoms.  Moderate OSA: - Patient has symptoms of loud snoring, waking up gasping for air and daytime sleepiness. Home sleep study on 06/03/21 >> AHI 23.4/hr with SpO2 low 70%. Reviewed treatment options including weight loss, oral appliance, CPAP therapy or referral to ENT. Due to severe of symptoms and daytime sleepiness recommended starting CPAP therapy, patient is agreement with plan.  DME referral placed for new CPAP start auto titrate 5 to 15 cm H2O with mask of choice. Encourage patient aim to wear CPAP every night min 4-6 hours or longer. Advised against driving if experiencing excessive daytime sleepiness. FU in 8 weeks for compliance check.   Orders: Start CPAP 5-15cm h20 with mask of choice/chin strap    09/06/2021 Patient presents today for 8-week follow-up OSA.  Symptoms of loud snoring, waking up gasping for air and daytime sleepiness.  Home sleep study on 06/03/2021 showed moderate obstructive sleep apnea, AHI 23.4 an hour.  During last visit patient was started on CPAP.  Patient was started on CPAP and has been wearing consistently the last 30 days.  He reports significant benefit in sleep quality and energy level since using.  He is able to sleep through the night and is not as tired the next day.  He is getting good rest.  He is having less chronic pain symptoms and has been able to decrease opioids.  He is feeling a lot better.  Not needing to drink as much caffeine.  Mind is clear.  He recently developed URI symptoms 2 weeks ago and has been having more difficulty wearing his CPAP due to nasal congestion.  He has an associated productive cough with yellow mucus and wheezing.  His 79-year-old has been sick on and off and plans on getting ear tubes with ENT in August.  Airview download 08/07/2021 - 09/05/2021 Usage days 30/30 days (100%); 26 days (87%): 4 hours Average usage  6 hours 6 minutes Pressure 5 to 15 cm H2O (13.4 cm H2O-95%) Airleaks 1.4L/min (95%) AHI 1.2   02/10/2022 Patient contacted today virtually for for 4-6 month follow-up. Recurrent bronchitis. Associated PND and wheezing. He gets most viral illness his son will get. Cough has been worse last 4 weeks ago. He had an ear infection at the same time. He saw ENT. He has been on 14 day course Augmentin and two rounds of prednisone. He uses Astelin/Fluticasone nasal spray at night. CPAP is working well. He feels more rested.   Airview download 12/10/21-02/07/22 60/60 days (100%); 52 days (87%) > 4 hours Average usage 6 hours 16 mins Pressure 8-18cm h20 (12.9cm h20-95%) Airleaks 16.8L/min AHI 2.4  OSA: - Patient is compliant with CPAP and reports benefit from use  - Pressure 8-18cm h20; Residual AHI 2.4 - No changes  - Continue to encourage patient wear CPAP nightly  Chronic sinusitis with recurrent bronchitis - Not acutely flared. Eos 300. Maintained on Astelin/fluticasone nasal spray. Starting Singulair 10mg  at bedtime. Checking PFTs. Following with ENT.    05/12/2022 Patient presents today for 18-month follow-up.  He has sleep apnea and chronic sinusitis with recurrent bronchitis.  During her last office visit he was started on Singulair 10 mg at bedtime. Pulmonary function testing in January showed normal lung function, no evidence of obstructive lung disease. He is having issues with chronic sinusitis and hearing. Continue to follow with ear nose and throat. Associated chest tightness. Using Albuterol more frequently than he'd like upwards of 5-6 times a day with temporary improvement. He is getting up mucus. CXR on 05/03/22 showed clear lungs.   He is doing well sleep wise. He is 97% compliant with CPAP use greater than 4 hours over the last 30 days.  Current pressure 8 to 18 cm H2O; residual AHI 1.3/hour. He takes adderrall 30mg  daily and 15mg  as needed.   Airview download  04/11/22-05/10/22 Usage days 29/30 days (97%) greater than 4 hours Average usage 6 hours 44 minutes Pressure 8 to 18 cm H2O (13.7 cm H2O-95%) Air leaks 22.8 L/min (95%) AHI 1.3   Allergies  Allergen Reactions   Vueway [Gadopiclenol] Nausea Only    Immunization History  Administered Date(s) Administered   COVID-19, mRNA, vaccine(Comirnaty)12 years and older 12/16/2021   Influenza Inj Mdck Quad Pf 12/16/2021   Influenza,inj,Quad PF,6+ Mos 01/17/2016, 10/13/2016   Influenza,inj,quad, With Preservative 12/03/2017   Meningococcal polysaccharide vaccine (MPSV4) 02/13/2009   PFIZER(Purple Top)SARS-COV-2 Vaccination 04/15/2019, 05/16/2019, 01/03/2020   Tdap 02/13/2009, 12/21/2017    Past Medical History:  Diagnosis Date   ADD (attention deficit disorder)    Anxiety    Narcolepsy through school    Tobacco History: Social History   Tobacco Use  Smoking Status Never  Smokeless Tobacco Never   Counseling given: Not Answered   Outpatient Medications Prior to Visit  Medication Sig Dispense Refill   albuterol (VENTOLIN HFA) 108 (90 Base) MCG/ACT inhaler Inhale 2 puffs into the lungs every 4 (four) hours as needed for  wheezing or shortness of breath. 6.7 g 1   amphetamine-dextroamphetamine (ADDERALL) 30 MG tablet Take 1 tablet by mouth 2 (two) times daily. May take an additional 1/2 tablet daily as needed 75 tablet 0   Azelastine HCl 137 MCG/SPRAY SOLN Place 2 sprays into each nostril 2 (two) times daily as needed (nasal symptoms). 30 mL 99   benzonatate (TESSALON) 200 MG capsule Take 1 capsule (200 mg total) by mouth 3 (three) times daily as needed for cough 20 capsule 0   buPROPion (WELLBUTRIN XL) 300 MG 24 hr tablet Take 1 tablet (300 mg total) by mouth daily. 90 tablet 3   Cholecalciferol (VITAMIN D3) 1.25 MG (50000 UT) CAPS Take 1 capsule by mouth once a week at 9am. 12 capsule 3   clomiPHENE (CLOMID) 50 MG tablet Take 1/2 tablet (25 mg dose) by mouth every other day. 30 tablet 0    cyclobenzaprine (FLEXERIL) 5 MG tablet Take 5-10 mg by mouth at bedtime as needed.     cyclobenzaprine (FLEXERIL) 5 MG tablet Take 1 tablet (5 mg total) by mouth 2 (two) times daily (Not with methocarbamol, but if methocarbamol isn't effective) 30 tablet 0   DULoxetine (CYMBALTA) 20 MG capsule TAKE 2 CAPSULES BY MOUTH EVERY DAY 60 capsule 0   levothyroxine (SYNTHROID) 25 MCG tablet Take 1 tablet (25 mcg total) by mouth daily. 90 tablet 1   methocarbamol (ROBAXIN) 500 MG tablet Take 1 tablet (500 mg total) by mouth 4 (four) times daily as needed (muscle pain, spasm). 120 tablet 1   Oxycodone HCl 10 MG TABS Take 1 tablet (10 mg total) by mouth every 8 (eight) hours as needed for pain 90 tablet 0   traZODone (DESYREL) 50 MG tablet Take 1-2 tablets (50-100 mg total) by mouth at bedtime. 180 tablet 3   montelukast (SINGULAIR) 10 MG tablet TAKE 1 TABLET BY MOUTH EVERYDAY AT BEDTIME 90 tablet 1   albuterol (VENTOLIN HFA) 108 (90 Base) MCG/ACT inhaler Inhale 2 puffs into the lungs every 4 (four) hours as needed for wheezing or shortness of breath. (Patient not taking: Reported on 05/12/2022) 6.7 g 1   buPROPion (WELLBUTRIN XL) 300 MG 24 hr tablet Take 1 tablet (300 mg total) by mouth daily. (Patient not taking: Reported on 05/12/2022) 90 tablet 3   hydrochlorothiazide (HYDRODIURIL) 25 MG tablet Take 1 tablet (25 mg total) by mouth daily. (Patient not taking: Reported on 05/12/2022) 90 tablet 3   levothyroxine (SYNTHROID) 25 MCG tablet Take 1 tablet (25 mcg total) by mouth daily. (Patient not taking: Reported on 05/12/2022) 90 tablet 1   losartan (COZAAR) 50 MG tablet Take 1 tablet (50 mg total) by mouth daily. (Patient not taking: Reported on 05/12/2022) 90 tablet 3   multivitamin (ONE-A-DAY MEN'S) TABS tablet Take 1 tablet by mouth daily. (Patient not taking: Reported on 05/12/2022)     ofloxacin (FLOXIN) 0.3 % OTIC solution Instill 4 drops to affected ear(s) twice daily as directed (Patient not taking: Reported  on 05/12/2022) 5 mL 0   potassium chloride SA (KLOR-CON M20) 20 MEQ tablet Take 1 tablet (20 mEq total) by mouth 2 (two) times daily. (Patient not taking: Reported on 05/12/2022) 180 tablet 3   predniSONE (DELTASONE) 50 MG tablet Take one tablet daily for the next 5 days. (Patient not taking: Reported on 05/12/2022) 5 tablet 0   No facility-administered medications prior to visit.   Review of Systems  Review of Systems  Constitutional: Negative.   HENT:  Positive for congestion  and postnasal drip.   Respiratory:  Positive for cough, chest tightness and shortness of breath.    Physical Exam  BP (!) 124/90 (BP Location: Left Arm, Patient Position: Sitting, Cuff Size: Large)   Pulse 78   Temp 98 F (36.7 C) (Oral)   Ht 6' (1.829 m)   Wt 300 lb (136.1 kg)   SpO2 100%   BMI 40.69 kg/m  Physical Exam Constitutional:      General: He is not in acute distress.    Appearance: Normal appearance. He is obese. He is not ill-appearing.  HENT:     Head: Normocephalic and atraumatic.     Mouth/Throat:     Mouth: Mucous membranes are moist.     Pharynx: Oropharynx is clear.  Cardiovascular:     Rate and Rhythm: Normal rate and regular rhythm.  Pulmonary:     Effort: Pulmonary effort is normal.     Breath sounds: Normal breath sounds.  Musculoskeletal:        General: Normal range of motion.  Skin:    General: Skin is warm and dry.  Neurological:     General: No focal deficit present.     Mental Status: He is alert and oriented to person, place, and time. Mental status is at baseline.  Psychiatric:        Mood and Affect: Mood normal.        Behavior: Behavior normal.        Thought Content: Thought content normal.        Judgment: Judgment normal.      Lab Results:  CBC    Component Value Date/Time   WBC 7.1 06/22/2021 0816   RBC 4.72 06/22/2021 0816   HGB 15.4 06/22/2021 0816   HCT 43.7 06/22/2021 0816   PLT 190.0 06/22/2021 0816   MCV 92.5 06/22/2021 0816   MCH 31.6  04/24/2015 1148   MCHC 35.3 06/22/2021 0816   RDW 13.2 06/22/2021 0816   LYMPHSABS 2.2 06/22/2021 0816   MONOABS 0.7 06/22/2021 0816   EOSABS 0.3 06/22/2021 0816   BASOSABS 0.1 06/22/2021 0816    BMET    Component Value Date/Time   NA 136 06/22/2021 0816   NA 144 01/17/2016 0945   K 3.8 06/22/2021 0816   CL 101 06/22/2021 0816   CO2 26 06/22/2021 0816   GLUCOSE 99 06/22/2021 0816   BUN 19 06/22/2021 0816   BUN 25 (H) 01/17/2016 0945   CREATININE 1.19 06/22/2021 0816   CREATININE 0.92 04/24/2015 1148   CALCIUM 9.4 06/22/2021 0816   GFRNONAA 110 01/17/2016 0945   GFRAA 127 01/17/2016 0945    BNP No results found for: "BNP"  ProBNP No results found for: "PROBNP"  Imaging: DG Cervical Spine 2 or 3 views  Result Date: 05/03/2022 CLINICAL DATA:  Neck pain EXAM: CERVICAL SPINE - 2-3 VIEW COMPARISON:  12/29/2014 FINDINGS: Normal alignment and prevertebral soft tissues. Minimal endplate degenerative changes at C5-6 and C6-7 with anterior bony spurring. Facets are aligned. Intact odontoid. Trachea midline. Lung apices are clear. IMPRESSION: Minor degenerative changes as above. No acute finding by plain radiography. Electronically Signed   By: Jerilynn Mages.  Shick M.D.   On: 05/03/2022 11:33   DG Chest 2 View  Result Date: 05/03/2022 CLINICAL DATA:  Chest wall pain EXAM: CHEST - 2 VIEW COMPARISON:  04/11/2022 FINDINGS: The heart size and mediastinal contours are within normal limits. Both lungs are clear. The visualized skeletal structures are unremarkable. IMPRESSION: No active cardiopulmonary disease. Electronically  Signed   By: Jerilynn Mages.  Shick M.D.   On: 05/03/2022 11:32   MR BRAIN/IAC W WO CONTRAST  Result Date: 05/01/2022 CLINICAL DATA:  Right ear conductive hearing loss. Fluid in ears, tinnitus. EXAM: MRI HEAD WITHOUT AND WITH CONTRAST TECHNIQUE: Multiplanar, multiecho pulse sequences of the brain and surrounding structures were obtained without and with intravenous contrast. CONTRAST:  10 cc  Vueway COMPARISON:  Temporal bone CT 04/10/2022, brain MRI 05/28/2021 FINDINGS: Brain: There is no acute intracranial hemorrhage, extra-axial fluid collection, or acute infarct. Parenchymal volume is normal. The ventricles are normal in size. Parenchymal signal is normal. There is no mass lesion or abnormal enhancement. There is no mass effect or midline shift. Internal auditory canals: There is no cerebellopontine angle mass. The cochleae and semicircular canals are normal. No focal abnormality along the course of the 7th and 8th cranial nerves. Normal porus acusticus and vestibular aqueduct bilaterally. Vascular: Normal flow voids. Skull and upper cervical spine: Normal marrow signal. Sinuses/Orbits: The paranasal sinuses are clear. The globes and orbits are unremarkable. Other: Partial opacification of the right mastoid air cells and middle ear cavity is again seen common better seen on prior temporal bone CT. IMPRESSION: 1. Normal appearance of the inner ears and IAC's. 2. Normal appearance of the brain with no acute intracranial pathology. 3. Partial opacification of the right mastoid air cells and middle ear cavities, better seen on prior temporal bone CT. Electronically Signed   By: Valetta Mole M.D.   On: 05/01/2022 14:55   DG Epidural/Nerve Root  Result Date: 04/18/2022 CLINICAL DATA:  32 year old male with lumbosacral spondylosis without myelopathy. He has a focal central disc protrusion at L4-L5 with left greater than right subarticular recess narrowing and a history of left L5 radiculopathy. He has undergone prior left L5 nerve root blocks with excellent results most recently on 12/09/2021. He presents today with recurring symptoms. Of note, due to the success of the nerve root blocks he has not required narcotic medication in over a year. EXAM: EPIDURAL/NERVE ROOT FLUOROSCOPY TIME:  Radiation exposure index: 7.7 mGy reference air kerma PROCEDURE: The procedure, risks, benefits, and alternatives were  explained to the patient. Questions regarding the procedure were encouraged and answered. The patient understands and consents to the procedure. LEFT L5 NERVE ROOT BLOCK AND TRANSFORAMINAL EPIDURAL: A posterior oblique approach was taken to the intervertebral foramen on the left at lumbar level using a curved 22 gauge spinal needle. Injection of Omnipaque 180 outlined the left L5 nerve root and showed good epidural spread. No vascular opacification is seen. 80 mg of Depo-Medrol mixed with 2 mL 1% lidocaine were instilled. The procedure was well-tolerated, and the patient was discharged thirty minutes following the injection in good condition. COMPLICATIONS: None IMPRESSION: Technically successful injection consisting of a left L5 nerve root block and transforaminal epidural. Electronically Signed   By: Jacqulynn Cadet M.D.   On: 04/18/2022 13:52     Assessment & Plan:   Moderate obstructive sleep apnea - HST 06/03/21 >> moderate obstructive sleep apnea, AHI 23.4/hr with SpO2 low 70% (average 90%). Patient reports clinical benefit in sleep quality and daytime fatigue. He is drinking less caffeine. Current CPAP pressure 8-18cm h20; Residual AHI 1.3/hour. No changes today.  Sinew to encourage patient aim to wear CPAP nightly for 4 to 6 hours or longer.  Encouraged weight loss efforts.  Follow-up in 6 months or sooner if needed.  Chronic sinusitis with recurrent bronchitis - Never smoked; Recurrent sinusitis - Continue Astelin/fluticasone nasal  spray; Singulair 10mg  at bedtime - Continue follow with ENT   Asthma - Patient has clincal symptoms of shortness of breath with exertion and chest tightness. Reports temporary improvement with SABA - PFTs were normal in January 2024 without evidence of obstructive airway disease, there was evidence of air trapping - CXR in March 2024 showed clear lungs  - Recommend trial low dose ICS/LABA - Advair 45-6mcg two puffs q 12 hours  - FU in 4 months or sooner if  needed   Martyn Ehrich, NP 05/12/2022

## 2022-05-12 NOTE — Assessment & Plan Note (Signed)
-   Patient has clincal symptoms of shortness of breath with exertion and chest tightness. Reports temporary improvement with SABA - PFTs were normal in January 2024 without evidence of obstructive airway disease, there was evidence of air trapping - CXR in March 2024 showed clear lungs  - Recommend trial low dose ICS/LABA - Advair 45-68mcg two puffs q 12 hours  - FU in 4 months or sooner if needed

## 2022-05-12 NOTE — Assessment & Plan Note (Addendum)
-   Never smoked; Recurrent sinusitis - Continue Astelin/fluticasone nasal spray; Singulair 10mg  at bedtime - Continue follow with ENT

## 2022-05-12 NOTE — Assessment & Plan Note (Addendum)
-   HST 06/03/21 >> moderate obstructive sleep apnea, AHI 23.4/hr with SpO2 low 70% (average 90%). Patient reports clinical benefit in sleep quality and daytime fatigue. He is drinking less caffeine. Current CPAP pressure 8-18cm h20; Residual AHI 1.3/hour. No changes today.  Sinew to encourage patient aim to wear CPAP nightly for 4 to 6 hours or longer.  Encouraged weight loss efforts.  Follow-up in 6 months or sooner if needed.

## 2022-05-15 DIAGNOSIS — G4733 Obstructive sleep apnea (adult) (pediatric): Secondary | ICD-10-CM | POA: Diagnosis not present

## 2022-05-15 NOTE — Progress Notes (Signed)
Reviewed and agree with assessment/plan.   Chesley Mires, MD [facility] Pulmonary/Critical Care 05/15/2022, 8:00 AM Pager:  [phone]

## 2022-05-18 DIAGNOSIS — H93291 Other abnormal auditory perceptions, right ear: Secondary | ICD-10-CM | POA: Diagnosis not present

## 2022-05-18 DIAGNOSIS — H9011 Conductive hearing loss, unilateral, right ear, with unrestricted hearing on the contralateral side: Secondary | ICD-10-CM | POA: Diagnosis not present

## 2022-05-18 DIAGNOSIS — Q165 Congenital malformation of inner ear: Secondary | ICD-10-CM | POA: Diagnosis not present

## 2022-05-22 NOTE — Progress Notes (Deleted)
  [name] [address] [place] [address] Phone: [phone] Subjective:    I'm seeing this patient by the request  of:  Porfirio Oar, PA  CC:   IBB:[id number]  MARCELLA BRASSARD is a 32 y.o. male coming in with complaint of back and neck pain. OMT 05/03/2022. Patient states   Medications patient has been prescribed: Cymbalta   Taking:         Reviewed prior external information including notes and imaging from previsou exam, outside providers and external EMR if available.   As well as notes that were available from care everywhere and other healthcare systems.  Past medical history, social, surgical and family history all reviewed in electronic medical record.  No pertanent information unless stated regarding to the chief complaint.   Past Medical History:  Diagnosis Date   ADD (attention deficit disorder)    Anxiety    Narcolepsy through school    Allergies  Allergen Reactions   Vueway [Gadopiclenol] Nausea Only     Review of Systems:  No headache, visual changes, nausea, vomiting, diarrhea, constipation, dizziness, abdominal pain, skin rash, fevers, chills, night sweats, weight loss, swollen lymph nodes, body aches, joint swelling, chest pain, shortness of breath, mood changes. POSITIVE muscle aches  Objective  There were no vitals taken for this visit.   General: No apparent distress alert and oriented x3 mood and affect normal, dressed appropriately.  HEENT: Pupils equal, extraocular movements intact  Respiratory: Patient's speak in full sentences and does not appear short of breath  Cardiovascular: No lower extremity edema, non tender, no erythema  Gait MSK:  Back   Osteopathic findings  C2 flexed rotated and side bent right C6 flexed rotated and side bent left T3 extended rotated and side bent right inhaled rib T9 extended rotated and side bent left L2 flexed rotated and side bent right Sacrum right on  right       Assessment and Plan:  No problem-specific Assessment & Plan notes found for this encounter.    Nonallopathic problems  Decision today to treat with OMT was based on Physical Exam  After verbal consent patient was treated with HVLA, ME, FPR techniques in cervical, rib, thoracic, lumbar, and sacral  areas  Patient tolerated the procedure well with improvement in symptoms  Patient given exercises, stretches and lifestyle modifications  See medications in patient instructions if given  Patient will follow up in 4-8 weeks             Note: This dictation was prepared with Dragon dictation along with smaller phrase technology. Any transcriptional errors that result from this process are unintentional.

## 2022-05-24 ENCOUNTER — Ambulatory Visit: Payer: BC Managed Care – PPO | Admitting: Family Medicine

## 2022-05-25 NOTE — Progress Notes (Signed)
[name] [address] [place] [address] Phone: [phone] Subjective:   Frank [name], am serving as a scribe for Dr. Antoine Primas.  I'm seeing this patient by the request  of:  Porfirio Oar, PA  CC: back and neck pain follow up   [id number]  Frank [name] is a 32 y.o. male coming in with complaint of back and neck pain. OMT 05/03/2022. Patient states that he has been doing well. No new issues since last visit.   R wrist is popping less.     Medications patient has been prescribed: Cymbalta  Taking:         Reviewed prior external information including notes and imaging from previsou exam, outside providers and external EMR if available.   As well as notes that were available from care everywhere and other healthcare systems.  Past medical history, social, surgical and family history all reviewed in electronic medical record.  No pertanent information unless stated regarding to the chief complaint.   Past Medical History:  Diagnosis Date   ADD (attention deficit disorder)    Anxiety    Narcolepsy through school    Allergies  Allergen Reactions   Vueway [Gadopiclenol] Nausea Only     Review of Systems:  No headache, visual changes, nausea, vomiting, diarrhea, constipation, dizziness, abdominal pain, skin rash, fevers, chills, night sweats, weight loss, swollen lymph nodes, body aches, joint swelling, chest pain, shortness of breath, mood changes. POSITIVE muscle aches  Objective  Blood pressure (!) 120/92, pulse 80, height 6' (1.829 m), weight 300 lb (136.1 kg), SpO2 98 %.   General: No apparent distress alert and oriented x3 mood and affect normal, dressed appropriately.  HEENT: Pupils equal, extraocular movements intact  Respiratory: Patient's speak in full sentences and does not appear short of breath  Cardiovascular: No lower extremity edema, non tender, no erythema  Low back exam still has some loss of  lordosis noted.  Some tenderness to palpation in the paraspinal musculature.  Seems to have more of the right parascapular area. Right still has some instability noted of the lunate bone.  Osteopathic findings  C2 flexed rotated and side bent right C6 flexed rotated and side bent left T3 extended rotated and side bent right inhaled rib T9 extended rotated and side bent right L2 flexed rotated and side bent right Sacrum right on right       Assessment and Plan:  Scheuermann's disease Chronic, worsening mild.  We discussed home exercises and icing regimen, which activities to do and which ones to avoid.  Patient is going to continue to try to be active.  Patient responded well to osteopathic manipulation.  Patient will be having surgery for the potential for the CSF leak in July.  Patient wants to lose weight after that.  Discussed which activities could be helpful and which ones to potentially avoid at the moment.  Follow-up again in 6 to 8 weeks.  Continue to keep blood pressure under control    Nonallopathic problems  Decision today to treat with OMT was based on Physical Exam  After verbal consent patient was treated with HVLA, ME, FPR techniques in cervical, rib, thoracic, lumbar, and sacral  areas  Patient tolerated the procedure well with improvement in symptoms  Patient given exercises, stretches and lifestyle modifications  See medications in patient instructions if given  Patient will follow up in 4-8 weeks    The above documentation has been reviewed and is accurate  and complete Lyndal Pulley, DO          Note: This dictation was prepared with Dragon dictation along with smaller phrase technology. Any transcriptional errors that result from this process are unintentional.

## 2022-05-29 ENCOUNTER — Encounter: Payer: Self-pay | Admitting: Family Medicine

## 2022-05-29 ENCOUNTER — Encounter: Payer: Self-pay | Admitting: *Deleted

## 2022-05-29 ENCOUNTER — Ambulatory Visit (INDEPENDENT_AMBULATORY_CARE_PROVIDER_SITE_OTHER): Payer: BC Managed Care – PPO | Admitting: Family Medicine

## 2022-05-29 VITALS — BP 120/92 | HR 80 | Ht 72.0 in | Wt 300.0 lb

## 2022-05-29 DIAGNOSIS — M9908 Segmental and somatic dysfunction of rib cage: Secondary | ICD-10-CM

## 2022-05-29 DIAGNOSIS — M4200 Juvenile osteochondrosis of spine, site unspecified: Secondary | ICD-10-CM

## 2022-05-29 DIAGNOSIS — M9903 Segmental and somatic dysfunction of lumbar region: Secondary | ICD-10-CM | POA: Diagnosis not present

## 2022-05-29 DIAGNOSIS — M4210 Adult osteochondrosis of spine, site unspecified: Secondary | ICD-10-CM | POA: Diagnosis not present

## 2022-05-29 DIAGNOSIS — M9904 Segmental and somatic dysfunction of sacral region: Secondary | ICD-10-CM | POA: Diagnosis not present

## 2022-05-29 DIAGNOSIS — M9901 Segmental and somatic dysfunction of cervical region: Secondary | ICD-10-CM | POA: Diagnosis not present

## 2022-05-29 DIAGNOSIS — M9902 Segmental and somatic dysfunction of thoracic region: Secondary | ICD-10-CM

## 2022-05-29 NOTE — Assessment & Plan Note (Signed)
Chronic, worsening mild.  We discussed home exercises and icing regimen, which activities to do and which ones to avoid.  Patient is going to continue to try to be active.  Patient responded well to osteopathic manipulation.  Patient will be having surgery for the potential for the CSF leak in July.  Patient wants to lose weight after that.  Discussed which activities could be helpful and which ones to potentially avoid at the moment.  Follow-up again in 6 to 8 weeks.  Continue to keep blood pressure under control

## 2022-06-02 ENCOUNTER — Telehealth: Payer: Self-pay | Admitting: Primary Care

## 2022-06-02 NOTE — Telephone Encounter (Signed)
New inhaler has been helpful, Pt states he has been seeing nothing but positive results and will like to continue with the inhaler next month

## 2022-06-04 ENCOUNTER — Other Ambulatory Visit (HOSPITAL_COMMUNITY): Payer: Self-pay

## 2022-06-05 ENCOUNTER — Other Ambulatory Visit (HOSPITAL_COMMUNITY): Payer: Self-pay

## 2022-06-05 MED ORDER — OXYCODONE HCL 10 MG PO TABS
10.0000 mg | ORAL_TABLET | Freq: Three times a day (TID) | ORAL | 0 refills | Status: DC | PRN
  Filled 2022-06-05: qty 90, 30d supply, fill #0

## 2022-06-05 MED ORDER — AMPHETAMINE-DEXTROAMPHETAMINE 30 MG PO TABS
30.0000 mg | ORAL_TABLET | Freq: Two times a day (BID) | ORAL | 0 refills | Status: DC
  Filled 2022-06-05: qty 75, 30d supply, fill #0

## 2022-06-05 MED ORDER — BENZONATATE 200 MG PO CAPS
200.0000 mg | ORAL_CAPSULE | Freq: Three times a day (TID) | ORAL | 0 refills | Status: DC | PRN
  Filled 2022-06-05: qty 20, 7d supply, fill #0

## 2022-06-06 NOTE — Telephone Encounter (Signed)
Called and spoke with pt who stated that the Advair HFA inhaler is working well for him and he has hardly had to use the albuterol inhaler any. Stated to pt to call the pharmacy when he is needing a refill of the inhaler so they can then send [place] the request and he verbalized understanding. Nothing further needed.

## 2022-06-07 ENCOUNTER — Other Ambulatory Visit (HOSPITAL_COMMUNITY): Payer: Self-pay

## 2022-06-07 DIAGNOSIS — Q165 Congenital malformation of inner ear: Secondary | ICD-10-CM | POA: Diagnosis not present

## 2022-06-07 DIAGNOSIS — G932 Benign intracranial hypertension: Secondary | ICD-10-CM | POA: Diagnosis not present

## 2022-06-08 ENCOUNTER — Other Ambulatory Visit: Payer: Self-pay

## 2022-06-12 DIAGNOSIS — G4733 Obstructive sleep apnea (adult) (pediatric): Secondary | ICD-10-CM | POA: Diagnosis not present

## 2022-06-14 ENCOUNTER — Other Ambulatory Visit (HOSPITAL_COMMUNITY): Payer: Self-pay

## 2022-06-15 DIAGNOSIS — F988 Other specified behavioral and emotional disorders with onset usually occurring in childhood and adolescence: Secondary | ICD-10-CM | POA: Diagnosis not present

## 2022-06-15 DIAGNOSIS — J45909 Unspecified asthma, uncomplicated: Secondary | ICD-10-CM | POA: Diagnosis not present

## 2022-06-15 DIAGNOSIS — M4200 Juvenile osteochondrosis of spine, site unspecified: Secondary | ICD-10-CM | POA: Diagnosis not present

## 2022-06-15 DIAGNOSIS — M545 Low back pain, unspecified: Secondary | ICD-10-CM | POA: Diagnosis not present

## 2022-06-16 ENCOUNTER — Other Ambulatory Visit (HOSPITAL_COMMUNITY): Payer: Self-pay

## 2022-06-19 ENCOUNTER — Other Ambulatory Visit (HOSPITAL_COMMUNITY): Payer: Self-pay

## 2022-06-19 NOTE — Progress Notes (Unsigned)
[name] [address] [place] [address] Phone: [phone] Subjective:   Frank [name], am serving as a scribe for Dr. Antoine Primas.  I'm seeing this patient by the request  of:  Porfirio Oar, PA  CC: Low back pain  [id number]  Frank [name] is a 32 y.o. male coming in with complaint of back and neck pain. OTM 05/29/2022. Patient states that last week he turned the wrong way and developed pain in R scapula and lumbar spine. Pain was sharp with lumbar flexion and breathing. Pain has improved over the week.   Right wrist pain continues. Has been strengthening wrist.   Medications patient has been prescribed: None  Taking:         Reviewed prior external information including notes and imaging from previsou exam, outside providers and external EMR if available.   As well as notes that were available from care everywhere and other healthcare systems.  Past medical history, social, surgical and family history all reviewed in electronic medical record.  No pertanent information unless stated regarding to the chief complaint.   Past Medical History:  Diagnosis Date   ADD (attention deficit disorder)    Anxiety    Narcolepsy through school    Allergies  Allergen Reactions   Vueway [Gadopiclenol] Nausea Only     Review of Systems:  No headache, visual changes, nausea, vomiting, diarrhea, constipation, dizziness, abdominal pain, skin rash, fevers, chills, night sweats, weight loss, swollen lymph nodes, body aches, joint swelling, chest pain, shortness of breath, mood changes. POSITIVE muscle aches  Objective  Blood pressure 110/80, pulse 94, height 6' (1.829 m), weight 298 lb (135.2 kg), SpO2 98 %.   General: No apparent distress alert and oriented x3 mood and affect normal, dressed appropriately.  HEENT: Pupils equal, extraocular movements intact  Respiratory: Patient's speak in full sentences and does not appear short  of breath  Cardiovascular: No lower extremity edema, non tender, no erythema  Low back exam does have significant loss of lordosis again.  Significant tightness noted with some radicular symptoms with straight leg test noted.  More pain in the thoracolumbar juncture.  Neck exam does have some limited sidebending bilaterally.  Osteopathic findings  C3 flexed rotated and side bent right C7 flexed rotated and side bent left T3 extended rotated and side bent right inhaled rib T8 extended rotated and side bent left L.3 flexed rotated and side bent right Sacrum right on right       Assessment and Plan:  Kyphosis of thoracic region Tightness again in the mid back.  Low back does have the radicular symptoms we will need to continue to monitor.  Discussed icing regimen and home exercises, increase activity slowly.  Follow-up again in 6 to 8 weeks.  Acute left lumbar radiculopathy Continues to respond well to the epidurals as well as the medications.  Discussed the Cymbalta as well.  Increase activity slowly.    Nonallopathic problems  Decision today to treat with OMT was based on Physical Exam  After verbal consent patient was treated with HVLA, ME, FPR techniques in cervical, rib, thoracic, lumbar, and sacral  areas  Patient tolerated the procedure well with improvement in symptoms  Patient given exercises, stretches and lifestyle modifications  See medications in patient instructions if given  Patient will follow up in 4-8 weeks     The above documentation has been reviewed and is accurate and complete [name], DO  Note: This dictation was prepared with Dragon dictation along with smaller phrase technology. Any transcriptional errors that result from this process are unintentional.

## 2022-06-21 ENCOUNTER — Encounter: Payer: Self-pay | Admitting: Family Medicine

## 2022-06-21 ENCOUNTER — Ambulatory Visit (INDEPENDENT_AMBULATORY_CARE_PROVIDER_SITE_OTHER): Payer: BC Managed Care – PPO | Admitting: Family Medicine

## 2022-06-21 VITALS — BP 110/80 | HR 94 | Ht 72.0 in | Wt 298.0 lb

## 2022-06-21 DIAGNOSIS — M9901 Segmental and somatic dysfunction of cervical region: Secondary | ICD-10-CM | POA: Diagnosis not present

## 2022-06-21 DIAGNOSIS — M40204 Unspecified kyphosis, thoracic region: Secondary | ICD-10-CM | POA: Diagnosis not present

## 2022-06-21 DIAGNOSIS — M5416 Radiculopathy, lumbar region: Secondary | ICD-10-CM

## 2022-06-21 DIAGNOSIS — M9908 Segmental and somatic dysfunction of rib cage: Secondary | ICD-10-CM | POA: Diagnosis not present

## 2022-06-21 DIAGNOSIS — M9904 Segmental and somatic dysfunction of sacral region: Secondary | ICD-10-CM | POA: Diagnosis not present

## 2022-06-21 DIAGNOSIS — M9903 Segmental and somatic dysfunction of lumbar region: Secondary | ICD-10-CM

## 2022-06-21 DIAGNOSIS — M9902 Segmental and somatic dysfunction of thoracic region: Secondary | ICD-10-CM

## 2022-06-21 MED ORDER — METHYLPREDNISOLONE ACETATE 80 MG/ML IJ SUSP
80.0000 mg | Freq: Once | INTRAMUSCULAR | Status: AC
  Administered 2022-06-21: 80 mg via INTRAMUSCULAR

## 2022-06-21 MED ORDER — KETOROLAC TROMETHAMINE 60 MG/2ML IM SOLN
60.0000 mg | Freq: Once | INTRAMUSCULAR | Status: AC
  Administered 2022-06-21: 60 mg via INTRAMUSCULAR

## 2022-06-21 NOTE — Patient Instructions (Signed)
Good to see you Injections in backside  See me in 5-6 weeks

## 2022-06-21 NOTE — Assessment & Plan Note (Signed)
Tightness again in the mid back.  Low back does have the radicular symptoms we will need to continue to monitor.  Discussed icing regimen and home exercises, increase activity slowly.  Follow-up again in 6 to 8 weeks.

## 2022-06-21 NOTE — Addendum Note (Signed)
Addended by: Evon Slack on: 06/21/2022 09:16 AM   Modules accepted: Orders

## 2022-06-21 NOTE — Assessment & Plan Note (Addendum)
Continues to respond well to the epidurals as well as the medications.  Discussed the Cymbalta as well.  Increase activity slowly.  Toradol and Depo-Medrol given today.

## 2022-06-23 ENCOUNTER — Other Ambulatory Visit: Payer: Self-pay | Admitting: Cardiovascular Disease

## 2022-07-03 ENCOUNTER — Other Ambulatory Visit (HOSPITAL_COMMUNITY): Payer: Self-pay

## 2022-07-03 ENCOUNTER — Other Ambulatory Visit: Payer: Self-pay | Admitting: Family Medicine

## 2022-07-03 MED ORDER — DULOXETINE HCL 20 MG PO CPEP
40.0000 mg | ORAL_CAPSULE | Freq: Every day | ORAL | 0 refills | Status: DC
  Filled 2022-07-03: qty 60, 30d supply, fill #0

## 2022-07-04 ENCOUNTER — Other Ambulatory Visit: Payer: Self-pay

## 2022-07-05 ENCOUNTER — Other Ambulatory Visit (HOSPITAL_COMMUNITY): Payer: Self-pay

## 2022-07-05 DIAGNOSIS — Q165 Congenital malformation of inner ear: Secondary | ICD-10-CM | POA: Diagnosis not present

## 2022-07-05 DIAGNOSIS — R519 Headache, unspecified: Secondary | ICD-10-CM | POA: Diagnosis not present

## 2022-07-05 DIAGNOSIS — G932 Benign intracranial hypertension: Secondary | ICD-10-CM | POA: Diagnosis not present

## 2022-07-05 DIAGNOSIS — H9319 Tinnitus, unspecified ear: Secondary | ICD-10-CM | POA: Diagnosis not present

## 2022-07-05 MED ORDER — BENZONATATE 200 MG PO CAPS
200.0000 mg | ORAL_CAPSULE | Freq: Three times a day (TID) | ORAL | 0 refills | Status: DC | PRN
  Filled 2022-07-05: qty 20, 7d supply, fill #0

## 2022-07-05 MED ORDER — CYCLOBENZAPRINE HCL 5 MG PO TABS
5.0000 mg | ORAL_TABLET | Freq: Two times a day (BID) | ORAL | 0 refills | Status: DC
  Filled 2022-07-05: qty 30, 15d supply, fill #0

## 2022-07-05 MED ORDER — OXYCODONE HCL 10 MG PO TABS
10.0000 mg | ORAL_TABLET | Freq: Three times a day (TID) | ORAL | 0 refills | Status: DC | PRN
  Filled 2022-07-05: qty 90, 30d supply, fill #0

## 2022-07-05 MED ORDER — METHOCARBAMOL 500 MG PO TABS
500.0000 mg | ORAL_TABLET | Freq: Four times a day (QID) | ORAL | 1 refills | Status: DC | PRN
  Filled 2022-07-05: qty 120, 30d supply, fill #0
  Filled 2022-08-02 (×2): qty 120, 30d supply, fill #1

## 2022-07-05 MED ORDER — AMPHETAMINE-DEXTROAMPHETAMINE 30 MG PO TABS
30.0000 mg | ORAL_TABLET | Freq: Two times a day (BID) | ORAL | 0 refills | Status: DC
  Filled 2022-07-05: qty 75, 30d supply, fill #0

## 2022-07-12 DIAGNOSIS — G4733 Obstructive sleep apnea (adult) (pediatric): Secondary | ICD-10-CM | POA: Diagnosis not present

## 2022-07-14 ENCOUNTER — Other Ambulatory Visit (HOSPITAL_COMMUNITY): Payer: Self-pay

## 2022-07-15 ENCOUNTER — Other Ambulatory Visit (HOSPITAL_COMMUNITY): Payer: Self-pay

## 2022-07-17 NOTE — Progress Notes (Unsigned)
[name] [address] [place] [address] Phone: [phone] Subjective:   [name], am serving as a scribe for Dr. Antoine Primas.  I'm seeing this patient by the request  of:  Porfirio Oar, PA  CC: Back and neck pain follow-up  [id number]  Frank [name] is a 32 y.o. male coming in with complaint of back and neck pain. OMT 06/21/2022. Patient states doing well. Still having pain in his back.  Medications patient has been prescribed: None  Taking:      Reviewed prior external information including notes and imaging from previsou exam, outside providers and external EMR if available.   As well as notes that were available from care everywhere and other healthcare systems.  Past medical history, social, surgical and family history all reviewed in electronic medical record.  No pertanent information unless stated regarding to the chief complaint.   Past Medical History:  Diagnosis Date   ADD (attention deficit disorder)    Anxiety    Narcolepsy through school    Allergies  Allergen Reactions   Vueway [Gadopiclenol] Nausea Only     Review of Systems:  No visual changes, nausea, vomiting, diarrhea, constipation, dizziness, abdominal pain, skin rash, fevers, chills, night sweats, weight loss, swollen lymph nodes, body aches, joint swelling, chest pain, shortness of breath, mood changes. POSITIVE muscle aches, headache   Objective  Blood pressure (!) 128/90, pulse 89, height 6' (1.829 m), SpO2 97 %.   General: No apparent distress alert and oriented x3 mood and affect normal, dressed appropriately.  HEENT: Pupils equal, extraocular movements intact  Respiratory: Patient's speak in full sentences and does not appear short of breath  Cardiovascular: No lower extremity edema, non tender, no erythema  Low back still has some loss lordosis and poor core strength.  Significant tightness in the thoracic vertebrae is noted.  Pain  is out of proportion to the amount of palpation.  Some limited range of motion in all planes.  Significant tightness on the right side of the neck noted but a negative Spurling's.  Osteopathic findings  C2 flexed rotated and side bent right C6 flexed rotated and side bent right T3 extended rotated and side bent right inhaled rib T9 extended rotated and side bent left L3 flexed rotated and side bent left Sacrum right on right     Assessment and Plan:  Scheuermann's disease , continues to have chronic discomfort and pain.  Patient has gotten pain medication from primary care provider.  We discussed the Cymbalta again. Also discussed injections that do not seem to be causing long-term benefit.  Patient wants to see how he responds to the surgery he is having for the mastoidectomy and seeing if that will help some of the intracranial hypertension.  Discussed which activities to do and which ones to avoid.  Follow-up again in 6 to 8 weeks Toradol and Depo-Medrol given today  Nonallopathic problems  Decision today to treat with OMT was based on Physical Exam  After verbal consent patient was treated with HVLA, ME, FPR techniques in cervical, rib, thoracic, lumbar, and sacral  areas  Patient tolerated the procedure well with improvement in symptoms  Patient given exercises, stretches and lifestyle modifications  See medications in patient instructions if given  Patient will follow up in 4-8 weeks    The above documentation has been reviewed and is accurate and complete [name], DO          Note:  This dictation was prepared with Dragon dictation along with smaller phrase technology. Any transcriptional errors that result from this process are unintentional.

## 2022-07-18 ENCOUNTER — Encounter: Payer: Self-pay | Admitting: Family Medicine

## 2022-07-18 ENCOUNTER — Ambulatory Visit (INDEPENDENT_AMBULATORY_CARE_PROVIDER_SITE_OTHER): Payer: BC Managed Care – PPO | Admitting: Family Medicine

## 2022-07-18 ENCOUNTER — Other Ambulatory Visit (HOSPITAL_COMMUNITY): Payer: Self-pay

## 2022-07-18 VITALS — BP 128/90 | HR 89 | Ht 72.0 in

## 2022-07-18 DIAGNOSIS — M5416 Radiculopathy, lumbar region: Secondary | ICD-10-CM | POA: Diagnosis not present

## 2022-07-18 DIAGNOSIS — M9908 Segmental and somatic dysfunction of rib cage: Secondary | ICD-10-CM

## 2022-07-18 DIAGNOSIS — M9904 Segmental and somatic dysfunction of sacral region: Secondary | ICD-10-CM

## 2022-07-18 DIAGNOSIS — M9901 Segmental and somatic dysfunction of cervical region: Secondary | ICD-10-CM | POA: Diagnosis not present

## 2022-07-18 DIAGNOSIS — M9903 Segmental and somatic dysfunction of lumbar region: Secondary | ICD-10-CM

## 2022-07-18 DIAGNOSIS — M40204 Unspecified kyphosis, thoracic region: Secondary | ICD-10-CM

## 2022-07-18 DIAGNOSIS — M4200 Juvenile osteochondrosis of spine, site unspecified: Secondary | ICD-10-CM | POA: Diagnosis not present

## 2022-07-18 DIAGNOSIS — M9902 Segmental and somatic dysfunction of thoracic region: Secondary | ICD-10-CM

## 2022-07-18 MED ORDER — KETOROLAC TROMETHAMINE 60 MG/2ML IM SOLN
60.0000 mg | Freq: Once | INTRAMUSCULAR | Status: AC
  Administered 2022-07-18: 60 mg via INTRAMUSCULAR

## 2022-07-18 MED ORDER — METHYLPREDNISOLONE ACETATE 80 MG/ML IJ SUSP
80.0000 mg | Freq: Once | INTRAMUSCULAR | Status: AC
  Administered 2022-07-18: 80 mg via INTRAMUSCULAR

## 2022-07-18 NOTE — Assessment & Plan Note (Signed)
,   continues to have chronic discomfort and pain.  Patient has gotten pain medication from primary care provider.  We discussed the Cymbalta again. Also discussed injections that do not seem to be causing long-term benefit.  Patient wants to see how he responds to the surgery he is having for the mastoidectomy and seeing if that will help some of the intracranial hypertension.  Discussed which activities to do and which ones to avoid.  Follow-up again in 6 to 8 weeks

## 2022-07-19 ENCOUNTER — Ambulatory Visit: Payer: BC Managed Care – PPO | Admitting: Family Medicine

## 2022-07-24 ENCOUNTER — Other Ambulatory Visit (HOSPITAL_COMMUNITY): Payer: Self-pay

## 2022-07-29 ENCOUNTER — Other Ambulatory Visit (HOSPITAL_COMMUNITY): Payer: Self-pay

## 2022-07-31 ENCOUNTER — Other Ambulatory Visit (HOSPITAL_COMMUNITY): Payer: Self-pay

## 2022-08-02 ENCOUNTER — Other Ambulatory Visit (HOSPITAL_COMMUNITY): Payer: Self-pay

## 2022-08-02 ENCOUNTER — Other Ambulatory Visit: Payer: Self-pay | Admitting: Family Medicine

## 2022-08-02 ENCOUNTER — Other Ambulatory Visit: Payer: Self-pay

## 2022-08-02 MED ORDER — OXYCODONE HCL 10 MG PO TABS
ORAL_TABLET | ORAL | 0 refills | Status: DC
  Filled 2022-08-02: qty 90, 30d supply, fill #0

## 2022-08-02 MED ORDER — BENZONATATE 200 MG PO CAPS
ORAL_CAPSULE | ORAL | 0 refills | Status: DC
  Filled 2022-08-02: qty 20, 7d supply, fill #0

## 2022-08-02 MED ORDER — CYCLOBENZAPRINE HCL 5 MG PO TABS
ORAL_TABLET | ORAL | 0 refills | Status: DC
  Filled 2022-08-02: qty 30, 15d supply, fill #0

## 2022-08-02 MED ORDER — ALBUTEROL SULFATE HFA 108 (90 BASE) MCG/ACT IN AERS
2.0000 | INHALATION_SPRAY | RESPIRATORY_TRACT | 1 refills | Status: AC | PRN
  Filled 2022-08-02: qty 6.7, 16d supply, fill #0
  Filled 2022-08-28: qty 6.7, 16d supply, fill #1

## 2022-08-02 MED ORDER — AMPHETAMINE-DEXTROAMPHETAMINE 30 MG PO TABS
ORAL_TABLET | ORAL | 0 refills | Status: DC
  Filled 2022-08-02: qty 25, 10d supply, fill #0
  Filled 2022-08-02: qty 50, 20d supply, fill #0

## 2022-08-02 MED ORDER — DULOXETINE HCL 20 MG PO CPEP
40.0000 mg | ORAL_CAPSULE | Freq: Every day | ORAL | 0 refills | Status: DC
  Filled 2022-08-02: qty 60, 30d supply, fill #0

## 2022-08-03 ENCOUNTER — Other Ambulatory Visit (HOSPITAL_COMMUNITY): Payer: Self-pay

## 2022-08-03 NOTE — Progress Notes (Signed)
Frank [name] D.[facility] [address] [place] [address] Phone: [phone]   Assessment and Plan:     1. Kyphosis of thoracic region, unspecified kyphosis type 2. Chronic right-sided thoracic back pain 3. Right wrist pain 4. Somatic dysfunction of cervical region 5. Somatic dysfunction of thoracic region 6. Somatic dysfunction of lumbar region 7. Somatic dysfunction of pelvic region 8. Somatic dysfunction of upper extremities  -Chronic with exacerbation, subsequent sports medicine visit - Recurrence of multiple musculoskeletal complaints most prominent being right-sided thoracic pain.  Additionally patient having some neck and right wrist discomfort.  Patient had moderate relief after OMT, though continued to have right-sided thoracic discomfort, so we elected to proceed with IM injection of methylprednisone and Toradol - Patient elected for IM injection of methylprednisone 80 mg/Toradol 60 mg.  Injection given in clinic today and tolerated well. - Patient has received relief with OMT in the past.  Elects for repeat OMT today.  Tolerated well per note below. - Decision today to treat with OMT was based on Physical Exam   After verbal consent patient was treated with HVLA (high velocity low amplitude), ME (muscle energy), FPR (flex positional release), ST (soft tissue), PC/PD (Pelvic Compression/ Pelvic Decompression) techniques in cervical, upper extremity, thoracic, lumbar, and pelvic areas. Patient tolerated the procedure well with improvement in symptoms.  Patient educated on potential side effects of soreness and recommended to rest, hydrate, and use Tylenol as needed for pain control.   Pertinent previous records reviewed include none   Follow Up: Patient has follow-up scheduled with Dr. Katrinka Blazing in 3 weeks for reevaluation.  If pain flares before that time, he may follow-up with me for repeat OMT and reevaluation   Subjective:   I, Moenique Parris, am  serving as a Neurosurgeon for Doctor Richardean Sale  Chief Complaint: back and neck   HPI:  Frank [name] is a 32 y.o. male coming in with complaint of back and neck pain. OMT 06/21/2022. Patient states doing well. Still having pain in his back.   Medications patient has been prescribed: None  08/04/2022 Patient states back and neck are a little out . He is about to go on a trip and just wants to feel good. Has a kink in his thoracic right side feels like something is ripping through a muscle , would like a cocktail if can't get it back aligned. Wrist is out and neck is kinked   Relevant Historical Information: scheuermanns disease  Additional pertinent review of systems negative.  Current Outpatient Medications  Medication Sig Dispense Refill   albuterol (VENTOLIN HFA) 108 (90 Base) MCG/ACT inhaler Inhale 2 puffs into the lungs every 4 (four) hours as needed for wheezing or shortness of breath. 6.7 g 1   amphetamine-dextroamphetamine (ADDERALL) 30 MG tablet Take 1 tablet by mouth 2 (two) times daily (May take an additional 1/2 tablet daily as needed) 75 tablet 0   Azelastine HCl 137 MCG/SPRAY SOLN Place 2 sprays into each nostril 2 (two) times daily as needed (nasal symptoms). 30 mL 99   benzonatate (TESSALON) 200 MG capsule Take 1 capsule (200 mg total) by mouth 3 (three) times daily as needed for cough 20 capsule 0   buPROPion (WELLBUTRIN XL) 300 MG 24 hr tablet Take 1 tablet (300 mg total) by mouth daily. 90 tablet 3   Cholecalciferol (VITAMIN D3) 1.25 MG (50000 UT) CAPS Take 1 capsule by mouth once a week at 9am. 12 capsule 3  clomiPHENE (CLOMID) 50 MG tablet Take 1/2 tablet (25 mg dose) by mouth every other day. 30 tablet 0   cyclobenzaprine (FLEXERIL) 5 MG tablet Take 5-10 mg by mouth at bedtime as needed.     cyclobenzaprine (FLEXERIL) 5 MG tablet Take 1 tablet (5 mg total) by mouth 2 (two) times daily (Not with methocarbamol, but if methocarbamol isn't effective) 30 tablet 0    DULoxetine (CYMBALTA) 20 MG capsule Take 2 capsules (40 mg total) by mouth daily. 60 capsule 0   fluticasone-salmeterol (ADVAIR HFA) 45-21 MCG/ACT inhaler Inhale 2 puffs into the lungs 2 (two) times daily. 1 each 3   levothyroxine (SYNTHROID) 25 MCG tablet Take 1 tablet (25 mcg total) by mouth daily. 90 tablet 1   methocarbamol (ROBAXIN) 500 MG tablet Take 1 tablet (500 mg total) by mouth 4 (four) times daily as needed (muscle pain, spasm) 120 tablet 1   montelukast (SINGULAIR) 10 MG tablet TAKE 1 TABLET BY MOUTH EVERYDAY AT BEDTIME 90 tablet 3   Oxycodone HCl 10 MG TABS Take one tablet (10 mg dose) by mouth every 8 (eight) hours as needed for Pain. Max Daily Amount: 30 mg 90 tablet 0   traZODone (DESYREL) 50 MG tablet Take 1-2 tablets (50-100 mg total) by mouth at bedtime. 180 tablet 3   No current facility-administered medications for this visit.      Objective:     Vitals:   08/04/22 0821  BP: 128/82  Pulse: 79  SpO2: 98%  Weight: (!) 301 lb (136.5 kg)  Height: 6' (1.829 m)      Body mass index is 40.82 kg/m.    Physical Exam:     General: Well-appearing, cooperative, sitting comfortably in no acute distress.   OMT Physical Exam:  ASIS Compression Test: Positive Right Cervical: TTP paraspinal, C3-5 RR SR Right upper extremity: Instability of right wrist joint with history of fracture.  Articulatory and FPR techniques tolerated well Thoracic: TTP paraspinal, T6 RRSR Lumbar: TTP paraspinal, L2 RLSL Pelvis: Right anterior innominate  Electronically signed by:  Frank [name] D.[facility] 8:47 AM 08/04/22

## 2022-08-04 ENCOUNTER — Ambulatory Visit (INDEPENDENT_AMBULATORY_CARE_PROVIDER_SITE_OTHER): Payer: BC Managed Care – PPO | Admitting: Sports Medicine

## 2022-08-04 VITALS — BP 128/82 | HR 79 | Ht 72.0 in | Wt 301.0 lb

## 2022-08-04 DIAGNOSIS — G8929 Other chronic pain: Secondary | ICD-10-CM | POA: Diagnosis not present

## 2022-08-04 DIAGNOSIS — M25531 Pain in right wrist: Secondary | ICD-10-CM | POA: Diagnosis not present

## 2022-08-04 DIAGNOSIS — M9901 Segmental and somatic dysfunction of cervical region: Secondary | ICD-10-CM | POA: Diagnosis not present

## 2022-08-04 DIAGNOSIS — M9907 Segmental and somatic dysfunction of upper extremity: Secondary | ICD-10-CM

## 2022-08-04 DIAGNOSIS — M546 Pain in thoracic spine: Secondary | ICD-10-CM | POA: Diagnosis not present

## 2022-08-04 DIAGNOSIS — M9905 Segmental and somatic dysfunction of pelvic region: Secondary | ICD-10-CM

## 2022-08-04 DIAGNOSIS — M9903 Segmental and somatic dysfunction of lumbar region: Secondary | ICD-10-CM | POA: Diagnosis not present

## 2022-08-04 DIAGNOSIS — M9902 Segmental and somatic dysfunction of thoracic region: Secondary | ICD-10-CM | POA: Diagnosis not present

## 2022-08-04 DIAGNOSIS — M40204 Unspecified kyphosis, thoracic region: Secondary | ICD-10-CM | POA: Diagnosis not present

## 2022-08-04 MED ORDER — METHYLPREDNISOLONE ACETATE 80 MG/ML IJ SUSP
80.0000 mg | Freq: Once | INTRAMUSCULAR | Status: AC
  Administered 2022-08-04: 80 mg via INTRAMUSCULAR

## 2022-08-04 MED ORDER — KETOROLAC TROMETHAMINE 60 MG/2ML IM SOLN
60.0000 mg | Freq: Once | INTRAMUSCULAR | Status: AC
  Administered 2022-08-04: 60 mg via INTRAMUSCULAR

## 2022-08-10 DIAGNOSIS — G4733 Obstructive sleep apnea (adult) (pediatric): Secondary | ICD-10-CM | POA: Diagnosis not present

## 2022-08-14 DIAGNOSIS — G4733 Obstructive sleep apnea (adult) (pediatric): Secondary | ICD-10-CM | POA: Diagnosis not present

## 2022-08-15 ENCOUNTER — Other Ambulatory Visit (HOSPITAL_COMMUNITY): Payer: Self-pay

## 2022-08-15 MED ORDER — LEVOTHYROXINE SODIUM 25 MCG PO TABS
25.0000 ug | ORAL_TABLET | Freq: Every day | ORAL | 1 refills | Status: DC
  Filled 2022-08-15 – 2023-04-20 (×7): qty 90, 90d supply, fill #0

## 2022-08-15 MED ORDER — ADVAIR HFA 45-21 MCG/ACT IN AERO
2.0000 | INHALATION_SPRAY | Freq: Two times a day (BID) | RESPIRATORY_TRACT | 1 refills | Status: DC
  Filled 2022-08-15: qty 12, 23d supply, fill #0

## 2022-08-15 NOTE — Progress Notes (Unsigned)
[name] [address] [place] [address] Phone: [phone] Subjective:   [name], am serving as a scribe for Dr. Antoine Primas.  I'm seeing this patient by the request  of:  Porfirio Oar, PA  CC: back and neck pain   [id number]  SEKOU ATTEBERY is a 32 y.o. male coming in with complaint of back and neck pain. OMT 07/18/2022. Saw Dr. Jean Rosenthal on 08/04/2022.  Was having some exacerbation.  In addition of this patient is scheduled at the end of this month to have a tympanomastoidectomy.  Patient states that Dr. Edison Pace manipulation was helpful. Was able to ride [place] miles on bike at beach.   Medications patient has been prescribed: Cymbalta  Taking:         Reviewed prior external information including notes and imaging from previsou exam, outside providers and external EMR if available.   As well as notes that were available from care everywhere and other healthcare systems.  Past medical history, social, surgical and family history all reviewed in electronic medical record.  No pertanent information unless stated regarding to the chief complaint.   Past Medical History:  Diagnosis Date   ADD (attention deficit disorder)    Anxiety    Narcolepsy through school    Allergies  Allergen Reactions   Vueway [Gadopiclenol] Nausea Only     Review of Systems:  No headache, visual changes, nausea, vomiting, diarrhea, constipation, dizziness, abdominal pain, skin rash, fevers, chills, night sweats, weight loss, swollen lymph nodes, body aches, joint swelling, chest pain, shortness of breath, mood changes. POSITIVE muscle aches  Objective  Blood pressure (!) 142/102, pulse 72, height 6' (1.829 m), weight (!) 304 lb (137.9 kg), SpO2 98 %.   General: No apparent distress alert and oriented x3 mood and affect normal, dressed appropriately.  HEENT: Pupils equal, extraocular movements intact  Respiratory: Patient's speak in full  sentences and does not appear short of breath  Cardiovascular: No lower extremity edema, non tender, no erythema  Back exam does have significant loss of lordosis.  Significant tightness noted on the right side of the paraspinal musculature.  Patient does have tightness with FABER test right greater than left.  Osteopathic findings  C2 flexed rotated and side bent right C6 flexed rotated and side bent left T3 extended rotated and side bent right inhaled rib T9 extended rotated and side bent left L3 flexed rotated and side bent right Sacrum right on right       Assessment and Plan:  Myofascial pain Seems to be more an exacerbation of the myofascial pain of the musculature.  No specific trigger points noted today.  Responded somewhat well to the osteopathic manipulation but given Toradol and Depo-Medrol again.  Patient is going to be having a surgery in the near future so would like him feeling a little bit better.  In addition to this we will get laboratory workup which is necessary prior to his surgery anyhow.  Follow-up with me 6 weeks after surgery.    Nonallopathic problems  Decision today to treat with OMT was based on Physical Exam  After verbal consent patient was treated with HVLA, ME, FPR techniques in cervical, rib, thoracic, lumbar, and sacral  areas  Patient tolerated the procedure well with improvement in symptoms  Patient given exercises, stretches and lifestyle modifications  See medications in patient instructions if given  Patient will follow up in 4-8 weeks      The above  documentation has been reviewed and is accurate and complete Lyndal Pulley, DO        Note: This dictation was prepared with Dragon dictation along with smaller phrase technology. Any transcriptional errors that result from this process are unintentional.

## 2022-08-16 ENCOUNTER — Ambulatory Visit (INDEPENDENT_AMBULATORY_CARE_PROVIDER_SITE_OTHER): Payer: BC Managed Care – PPO | Admitting: Family Medicine

## 2022-08-16 ENCOUNTER — Encounter: Payer: Self-pay | Admitting: Family Medicine

## 2022-08-16 VITALS — BP 142/102 | HR 72 | Ht 72.0 in | Wt 304.0 lb

## 2022-08-16 DIAGNOSIS — M9908 Segmental and somatic dysfunction of rib cage: Secondary | ICD-10-CM

## 2022-08-16 DIAGNOSIS — M7918 Myalgia, other site: Secondary | ICD-10-CM | POA: Diagnosis not present

## 2022-08-16 DIAGNOSIS — M9903 Segmental and somatic dysfunction of lumbar region: Secondary | ICD-10-CM

## 2022-08-16 DIAGNOSIS — M9901 Segmental and somatic dysfunction of cervical region: Secondary | ICD-10-CM

## 2022-08-16 DIAGNOSIS — M9902 Segmental and somatic dysfunction of thoracic region: Secondary | ICD-10-CM

## 2022-08-16 DIAGNOSIS — M2550 Pain in unspecified joint: Secondary | ICD-10-CM

## 2022-08-16 DIAGNOSIS — M9904 Segmental and somatic dysfunction of sacral region: Secondary | ICD-10-CM

## 2022-08-16 LAB — COMPREHENSIVE METABOLIC PANEL
ALT: 47 U/L (ref 0–53)
AST: 35 U/L (ref 0–37)
Albumin: 4.2 g/dL (ref 3.5–5.2)
Alkaline Phosphatase: 45 U/L (ref 39–117)
BUN: 18 mg/dL (ref 6–23)
CO2: 26 mEq/L (ref 19–32)
Calcium: 9.2 mg/dL (ref 8.4–10.5)
Chloride: 105 mEq/L (ref 96–112)
Creatinine, Ser: 1.15 mg/dL (ref 0.40–1.50)
GFR: 84.91 mL/min (ref >60.00)
Glucose, Bld: 99 mg/dL (ref 70–99)
Potassium: 3.8 mEq/L (ref 3.5–5.1)
Sodium: 137 mEq/L (ref 135–145)
Total Bilirubin: 1.1 mg/dL (ref 0.2–1.2)
Total Protein: 7 g/dL (ref 6.0–8.3)

## 2022-08-16 LAB — CBC WITH DIFFERENTIAL/PLATELET
Basophils Absolute: 0 10*3/uL (ref 0.0–0.1)
Basophils Relative: 0.6 % (ref 0.0–3.0)
Eosinophils Absolute: 0.4 10*3/uL (ref 0.0–0.7)
Eosinophils Relative: 6.5 % — ABNORMAL HIGH (ref 0.0–5.0)
HCT: 43.9 % (ref 39.0–52.0)
Hemoglobin: 15.3 g/dL (ref 13.0–17.0)
Lymphocytes Relative: 26.9 % (ref 12.0–46.0)
Lymphs Abs: 1.8 10*3/uL (ref 0.7–4.0)
MCHC: 34.7 g/dL (ref 30.0–36.0)
MCV: 92.3 fl (ref 78.0–100.0)
Monocytes Absolute: 0.5 10*3/uL (ref 0.1–1.0)
Monocytes Relative: 7.9 % (ref 3.0–12.0)
Neutro Abs: 3.9 10*3/uL (ref 1.4–7.7)
Neutrophils Relative %: 58.1 % (ref 43.0–77.0)
Platelets: 167 10*3/uL (ref 150.0–400.0)
RBC: 4.76 Mil/uL (ref 4.22–5.81)
RDW: 13 % (ref 11.5–15.5)
WBC: 6.7 10*3/uL (ref 4.0–10.5)

## 2022-08-16 LAB — SEDIMENTATION RATE: Sed Rate: 8 mm/hr (ref 0–15)

## 2022-08-16 LAB — TESTOSTERONE: Testosterone: 273.54 ng/dL — ABNORMAL LOW (ref 300.00–890.00)

## 2022-08-16 MED ORDER — METHYLPREDNISOLONE ACETATE 40 MG/ML IJ SUSP
40.0000 mg | Freq: Once | INTRAMUSCULAR | Status: AC
Start: 2022-08-16 — End: 2022-08-16
  Administered 2022-08-16: 40 mg via INTRAMUSCULAR

## 2022-08-16 MED ORDER — KETOROLAC TROMETHAMINE 30 MG/ML IJ SOLN
30.0000 mg | Freq: Once | INTRAMUSCULAR | Status: AC
Start: 2022-08-16 — End: 2022-08-16
  Administered 2022-08-16: 30 mg via INTRAMUSCULAR

## 2022-08-16 NOTE — Assessment & Plan Note (Signed)
Seems to be more an exacerbation of the myofascial pain of the musculature.  No specific trigger points noted today.  Responded somewhat well to the osteopathic manipulation but given Toradol and Depo-Medrol again.  Patient is going to be having a surgery in the near future so would like him feeling a little bit better.  In addition to this we will get laboratory workup which is necessary prior to his surgery anyhow.  Follow-up with me 6 weeks after surgery.

## 2022-08-16 NOTE — Addendum Note (Signed)
Addended by: Evon Slack on: 08/16/2022 08:45 AM   Modules accepted: Orders

## 2022-08-16 NOTE — Patient Instructions (Addendum)
Injections in backside Labs today See  you at next scheduled visit

## 2022-08-24 DIAGNOSIS — Q165 Congenital malformation of inner ear: Secondary | ICD-10-CM | POA: Diagnosis not present

## 2022-08-25 ENCOUNTER — Other Ambulatory Visit (HOSPITAL_COMMUNITY): Payer: Self-pay

## 2022-08-28 ENCOUNTER — Other Ambulatory Visit: Payer: Self-pay

## 2022-08-28 ENCOUNTER — Other Ambulatory Visit (HOSPITAL_COMMUNITY): Payer: Self-pay

## 2022-08-28 ENCOUNTER — Other Ambulatory Visit: Payer: Self-pay | Admitting: Family Medicine

## 2022-08-28 MED ORDER — DULOXETINE HCL 20 MG PO CPEP
40.0000 mg | ORAL_CAPSULE | Freq: Every day | ORAL | 0 refills | Status: DC
  Filled 2022-08-28: qty 60, 30d supply, fill #0

## 2022-08-29 ENCOUNTER — Other Ambulatory Visit (HOSPITAL_COMMUNITY): Payer: Self-pay

## 2022-08-29 MED ORDER — BENZONATATE 200 MG PO CAPS
ORAL_CAPSULE | ORAL | 0 refills | Status: DC
  Filled 2022-08-29: qty 20, 7d supply, fill #0

## 2022-08-29 MED ORDER — CYCLOBENZAPRINE HCL 5 MG PO TABS
5.0000 mg | ORAL_TABLET | Freq: Two times a day (BID) | ORAL | 0 refills | Status: DC
  Filled 2022-08-29: qty 30, 15d supply, fill #0

## 2022-08-29 MED ORDER — OXYCODONE HCL 10 MG PO TABS
10.0000 mg | ORAL_TABLET | Freq: Three times a day (TID) | ORAL | 0 refills | Status: AC | PRN
  Filled 2022-08-31: qty 90, 30d supply, fill #0

## 2022-08-29 MED ORDER — AMPHETAMINE-DEXTROAMPHETAMINE 30 MG PO TABS
ORAL_TABLET | ORAL | 0 refills | Status: DC
  Filled 2022-08-29: qty 75, 30d supply, fill #0
  Filled 2022-08-31: qty 55, 22d supply, fill #0
  Filled 2022-08-31: qty 20, 8d supply, fill #0

## 2022-08-30 ENCOUNTER — Other Ambulatory Visit (HOSPITAL_COMMUNITY): Payer: Self-pay

## 2022-08-31 ENCOUNTER — Other Ambulatory Visit (HOSPITAL_COMMUNITY): Payer: Self-pay

## 2022-09-07 ENCOUNTER — Other Ambulatory Visit: Payer: Self-pay | Admitting: Family Medicine

## 2022-09-07 NOTE — Progress Notes (Signed)
  [name] [address] [place] [address] Phone: [phone] Subjective:   Frank [name], am serving as a scribe for Dr. Antoine Primas.  I'm seeing this patient by the request  of:  Porfirio Oar, PA  CC: Neck pain, back pain, wrist pain follow-up  [id number]  Frank [name] is a 32 y.o. male coming in with complaint of back and neck pain. OMT 08/16/2022.  Patient is scheduled for a tympanomastoidectomy tomorrow.  Patient states here for routine OMT  Medications patient has been prescribed: Cymbalta, Trazadone  Taking: Cymbalta, Trazadone          Reviewed prior external information including notes and imaging from previsou exam, outside providers and external EMR if available.   As well as notes that were available from care everywhere and other healthcare systems.  Past medical history, social, surgical and family history all reviewed in electronic medical record.  No pertanent information unless stated regarding to the chief complaint.   Past Medical History:  Diagnosis Date   ADD (attention deficit disorder)    Anxiety    Narcolepsy through school    Allergies  Allergen Reactions   Vueway [Gadopiclenol] Nausea Only     Review of Systems:  No headache, visual changes, nausea, vomiting, diarrhea, constipation, dizziness, abdominal pain, skin rash, fevers, chills, night sweats, weight loss, swollen lymph nodes, body aches, joint swelling, chest pain, shortness of breath, mood changes. POSITIVE muscle aches  Objective  Blood pressure 130/80, pulse 76, height 6' (1.829 m), weight (!) 304 lb (137.9 kg), SpO2 99%.   General: No apparent distress alert and oriented x3 mood and affect normal, dressed appropriately.  HEENT: Pupils equal, extraocular movements intact  Respiratory: Patient's speak in full sentences and does not appear short of breath  Cardiovascular: No lower extremity edema, non tender, no erythema  Neck  exam does have significant tightness noted on the right side of the occipital region.  Patient also has tightness noted as well.  Osteopathic findings  C3 flexed rotated and side bent right C7 flexed rotated and side bent left T3 extended rotated and side bent right inhaled rib T9 extended rotated and side bent left L2 flexed rotated and side bent right Sacrum right on right     Assessment and Plan:  Kyphosis of thoracic region Tightness noted today.  Patient is going to be having surgery.  Will not do any osteopathic manipulation for at least 6 weeks.  Discussed increasing activity, home exercises, which activities to do and which ones to avoid.  Increase activity slowly.  Follow-up again in 6 to 8 weeks.    Nonallopathic problems  Decision today to treat with OMT was based on Physical Exam  After verbal consent patient was treated with HVLA, ME, FPR techniques in cervical, rib, thoracic, lumbar, and sacral  areas  Patient tolerated the procedure well with improvement in symptoms  Patient given exercises, stretches and lifestyle modifications  See medications in patient instructions if given  Patient will follow up in 4-8 weeks    The above documentation has been reviewed and is accurate and complete [name], DO          Note: This dictation was prepared with Dragon dictation along with smaller phrase technology. Any transcriptional errors that result from this process are unintentional.

## 2022-09-10 ENCOUNTER — Other Ambulatory Visit: Payer: Self-pay | Admitting: Primary Care

## 2022-09-11 ENCOUNTER — Encounter: Payer: Self-pay | Admitting: Family Medicine

## 2022-09-11 ENCOUNTER — Ambulatory Visit (INDEPENDENT_AMBULATORY_CARE_PROVIDER_SITE_OTHER): Payer: BC Managed Care – PPO | Admitting: Family Medicine

## 2022-09-11 ENCOUNTER — Ambulatory Visit: Payer: BC Managed Care – PPO | Admitting: Primary Care

## 2022-09-11 VITALS — BP 130/80 | HR 76 | Ht 72.0 in | Wt 304.0 lb

## 2022-09-11 DIAGNOSIS — M9904 Segmental and somatic dysfunction of sacral region: Secondary | ICD-10-CM

## 2022-09-11 DIAGNOSIS — M9901 Segmental and somatic dysfunction of cervical region: Secondary | ICD-10-CM | POA: Diagnosis not present

## 2022-09-11 DIAGNOSIS — M40204 Unspecified kyphosis, thoracic region: Secondary | ICD-10-CM | POA: Diagnosis not present

## 2022-09-11 DIAGNOSIS — M9903 Segmental and somatic dysfunction of lumbar region: Secondary | ICD-10-CM

## 2022-09-11 DIAGNOSIS — M9902 Segmental and somatic dysfunction of thoracic region: Secondary | ICD-10-CM | POA: Diagnosis not present

## 2022-09-11 DIAGNOSIS — M9908 Segmental and somatic dysfunction of rib cage: Secondary | ICD-10-CM | POA: Diagnosis not present

## 2022-09-11 NOTE — Patient Instructions (Addendum)
Good to see you Good luck with the surgery See me as scheduled cannot do manipulation until 6 weeks

## 2022-09-11 NOTE — Assessment & Plan Note (Signed)
Tightness noted today.  Patient is going to be having surgery.  Will not do any osteopathic manipulation for at least 6 weeks.  Discussed increasing activity, home exercises, which activities to do and which ones to avoid.  Increase activity slowly.  Follow-up again in 6 to 8 weeks.

## 2022-09-12 DIAGNOSIS — H9191 Unspecified hearing loss, right ear: Secondary | ICD-10-CM | POA: Diagnosis not present

## 2022-09-12 DIAGNOSIS — Q165 Congenital malformation of inner ear: Secondary | ICD-10-CM | POA: Diagnosis not present

## 2022-09-12 DIAGNOSIS — H9311 Tinnitus, right ear: Secondary | ICD-10-CM | POA: Diagnosis not present

## 2022-09-12 DIAGNOSIS — R42 Dizziness and giddiness: Secondary | ICD-10-CM | POA: Diagnosis not present

## 2022-09-12 DIAGNOSIS — H9201 Otalgia, right ear: Secondary | ICD-10-CM | POA: Diagnosis not present

## 2022-09-12 DIAGNOSIS — H9202 Otalgia, left ear: Secondary | ICD-10-CM | POA: Diagnosis not present

## 2022-09-13 ENCOUNTER — Ambulatory Visit: Payer: BC Managed Care – PPO | Admitting: Primary Care

## 2022-09-13 ENCOUNTER — Other Ambulatory Visit: Payer: Self-pay | Admitting: Primary Care

## 2022-09-21 ENCOUNTER — Other Ambulatory Visit (HOSPITAL_COMMUNITY): Payer: Self-pay

## 2022-09-21 DIAGNOSIS — Z13220 Encounter for screening for lipoid disorders: Secondary | ICD-10-CM | POA: Diagnosis not present

## 2022-09-21 DIAGNOSIS — J45909 Unspecified asthma, uncomplicated: Secondary | ICD-10-CM | POA: Diagnosis not present

## 2022-09-21 DIAGNOSIS — Z0000 Encounter for general adult medical examination without abnormal findings: Secondary | ICD-10-CM | POA: Diagnosis not present

## 2022-09-21 DIAGNOSIS — Z79891 Long term (current) use of opiate analgesic: Secondary | ICD-10-CM | POA: Diagnosis not present

## 2022-09-21 DIAGNOSIS — Z113 Encounter for screening for infections with a predominantly sexual mode of transmission: Secondary | ICD-10-CM | POA: Diagnosis not present

## 2022-09-21 DIAGNOSIS — E230 Hypopituitarism: Secondary | ICD-10-CM | POA: Diagnosis not present

## 2022-09-21 MED ORDER — AMPHETAMINE-DEXTROAMPHETAMINE 30 MG PO TABS
30.0000 mg | ORAL_TABLET | Freq: Two times a day (BID) | ORAL | 0 refills | Status: DC
  Filled 2022-09-28: qty 75, 30d supply, fill #0

## 2022-09-21 MED ORDER — OXYCODONE HCL 10 MG PO TABS
10.0000 mg | ORAL_TABLET | Freq: Three times a day (TID) | ORAL | 0 refills | Status: DC | PRN
  Filled 2022-09-28: qty 90, 30d supply, fill #0

## 2022-09-22 DIAGNOSIS — Z09 Encounter for follow-up examination after completed treatment for conditions other than malignant neoplasm: Secondary | ICD-10-CM | POA: Diagnosis not present

## 2022-09-22 DIAGNOSIS — Z9889 Other specified postprocedural states: Secondary | ICD-10-CM | POA: Diagnosis not present

## 2022-09-26 ENCOUNTER — Other Ambulatory Visit (HOSPITAL_COMMUNITY): Payer: Self-pay

## 2022-09-28 ENCOUNTER — Other Ambulatory Visit (HOSPITAL_COMMUNITY): Payer: Self-pay

## 2022-10-05 NOTE — Progress Notes (Deleted)
  [name] [address] [place] [address] Phone: [phone] Subjective:    I'm seeing this patient by the request  of:  Porfirio Oar, PA  CC:   [id number]  Frank [name] is a 32 y.o. male coming in with complaint of back and neck pain. OMT 09/11/2022. Patient states   Medications patient has been prescribed: Cymbalta, Trazadone  Taking:         Reviewed prior external information including notes and imaging from previsou exam, outside providers and external EMR if available.   As well as notes that were available from care everywhere and other healthcare systems.  Past medical history, social, surgical and family history all reviewed in electronic medical record.  No pertanent information unless stated regarding to the chief complaint.   Past Medical History:  Diagnosis Date   ADD (attention deficit disorder)    Anxiety    Narcolepsy through school    Allergies  Allergen Reactions   Vueway [Gadopiclenol] Nausea Only     Review of Systems:  No headache, visual changes, nausea, vomiting, diarrhea, constipation, dizziness, abdominal pain, skin rash, fevers, chills, night sweats, weight loss, swollen lymph nodes, body aches, joint swelling, chest pain, shortness of breath, mood changes. POSITIVE muscle aches  Objective  There were no vitals taken for this visit.   General: No apparent distress alert and oriented x3 mood and affect normal, dressed appropriately.  HEENT: Pupils equal, extraocular movements intact  Respiratory: Patient's speak in full sentences and does not appear short of breath  Cardiovascular: No lower extremity edema, non tender, no erythema  Gait MSK:  Back   Osteopathic findings  C2 flexed rotated and side bent right C6 flexed rotated and side bent left T3 extended rotated and side bent right inhaled rib T9 extended rotated and side bent left L2 flexed rotated and side bent right Sacrum  right on right       Assessment and Plan:  No problem-specific Assessment & Plan notes found for this encounter.    Nonallopathic problems  Decision today to treat with OMT was based on Physical Exam  After verbal consent patient was treated with HVLA, ME, FPR techniques in cervical, rib, thoracic, lumbar, and sacral  areas  Patient tolerated the procedure well with improvement in symptoms  Patient given exercises, stretches and lifestyle modifications  See medications in patient instructions if given  Patient will follow up in 4-8 weeks             Note: This dictation was prepared with Dragon dictation along with smaller phrase technology. Any transcriptional errors that result from this process are unintentional.

## 2022-10-09 ENCOUNTER — Ambulatory Visit: Payer: BC Managed Care – PPO | Admitting: Family Medicine

## 2022-10-10 DIAGNOSIS — G4733 Obstructive sleep apnea (adult) (pediatric): Secondary | ICD-10-CM | POA: Diagnosis not present

## 2022-10-12 NOTE — Progress Notes (Signed)
[name] [address] [place] [address] Phone: [phone] Subjective:   Frank [name], am serving as a scribe for Dr. Antoine Primas.  I'm seeing this patient by the request  of:  Porfirio Oar, PA  CC: Neck and back pain follow-up  [id number]  Frank [name] is a 32 y.o. male coming in with complaint of back and neck pain. OMT 09/11/2022. Patient states that he is doing the same as last visit.  Since we have seen the patient did have the mastoidectomy.  Started to have some increasing intermittent headaches that 1 evening woke him up at night.  Has had follow-up with the surgeon's PA twice but has not seen a surgeon but himself at the moment.  Patient is somewhat concerned of this area.  Denies any visual changes.  States that sometimes can have a runny nose with it.  Patient denies any radiation down the arms.  Does get some neck pain with it.  He is  Medications patient has been prescribed: Cymbalta, Trazadone  Taking: Yes         Reviewed prior external information including notes and imaging from previsou exam, outside providers and external EMR if available.   As well as notes that were available from care everywhere and other healthcare systems.  Past medical history, social, surgical and family history all reviewed in electronic medical record.  No pertanent information unless stated regarding to the chief complaint.   Past Medical History:  Diagnosis Date   ADD (attention deficit disorder)    Anxiety    Narcolepsy through school    Allergies  Allergen Reactions   Vueway [Gadopiclenol] Nausea Only     Review of Systems:  No headache, visual changes, nausea, vomiting, diarrhea, constipation, dizziness, abdominal pain, skin rash, fevers, chills, night sweats, weight loss, swollen lymph nodes, body aches, joint swelling, chest pain, shortness of breath, mood changes. POSITIVE muscle aches  Objective  Blood pressure  (!) 124/92, pulse 73, height 6' (1.829 m), weight (!) 314 lb (142.4 kg), SpO2 98%.   General: No apparent distress alert and oriented x3 mood and affect normal, dressed appropriately.  HEENT: Pupils equal, extraocular movements intact  Respiratory: Patient's speak in full sentences and does not appear short of breath  Cardiovascular: No lower extremity edema, non tender, no erythema  Gait MSK:  Back does have some loss of lordosis noted.  Patient has gained weight since we have seen him last.  Tightness noted in the paraspinal musculature.  Patient's pain seems to be mostly in the thoracolumbar juncture. Patient's scar in the right ear well-healed at this time.  No swelling noted in the area.   Osteopathic findings  T3 extended rotated and side bent right inhaled rib T9 extended rotated and side bent left L2 flexed rotated and side bent right Sacrum right on right       Assessment and Plan:  Kyphosis of thoracic region Increasing tightness noted since seen patient.  Patient has not been as active secondary to the surgery and the mastoidectomy.  Discussed with patient about icing regimen and home exercises.  Encourage patient to continue to discuss with other physicians about the intermittent headaches he is having 0.7 quite severe.  Still need to consider the possibility of the intracranial pretension.  Still is on the Lasix.  Follow-up again in 6 weeks total time discussing patient's headaches as well as discussing with patient about different follow-up procedures 31 minutes.  Patient has  had significant weight gain since surgery but hopefully with increasing activity this will get better as well.  Otherwise encouraged him to discuss with his primary care provider about the possibility of weight loss medications  and modification    Nonallopathic problems  Decision today to treat with OMT was based on Physical Exam  After verbal consent patient was treated with HVLA, ME, FPR  techniques in cervical, rib, thoracic, lumbar, and sacral  areas  Patient tolerated the procedure well with improvement in symptoms  Patient given exercises, stretches and lifestyle modifications  See medications in patient instructions if given  Patient will follow up in 4-8 weeks    The above documentation has been reviewed and is accurate and complete [name], DO          Note: This dictation was prepared with Dragon dictation along with smaller phrase technology. Any transcriptional errors that result from this process are unintentional.

## 2022-10-14 ENCOUNTER — Other Ambulatory Visit (HOSPITAL_COMMUNITY): Payer: Self-pay

## 2022-10-17 DIAGNOSIS — Z9622 Myringotomy tube(s) status: Secondary | ICD-10-CM | POA: Diagnosis not present

## 2022-10-17 DIAGNOSIS — Z09 Encounter for follow-up examination after completed treatment for conditions other than malignant neoplasm: Secondary | ICD-10-CM | POA: Diagnosis not present

## 2022-10-17 DIAGNOSIS — Z9889 Other specified postprocedural states: Secondary | ICD-10-CM | POA: Diagnosis not present

## 2022-10-17 DIAGNOSIS — H6531 Chronic mucoid otitis media, right ear: Secondary | ICD-10-CM | POA: Diagnosis not present

## 2022-10-18 ENCOUNTER — Other Ambulatory Visit (HOSPITAL_COMMUNITY): Payer: Self-pay

## 2022-10-19 ENCOUNTER — Ambulatory Visit (INDEPENDENT_AMBULATORY_CARE_PROVIDER_SITE_OTHER): Payer: BC Managed Care – PPO | Admitting: Family Medicine

## 2022-10-19 ENCOUNTER — Encounter: Payer: Self-pay | Admitting: Family Medicine

## 2022-10-19 VITALS — BP 124/92 | HR 73 | Ht 72.0 in | Wt 314.0 lb

## 2022-10-19 DIAGNOSIS — M9901 Segmental and somatic dysfunction of cervical region: Secondary | ICD-10-CM | POA: Diagnosis not present

## 2022-10-19 DIAGNOSIS — M9903 Segmental and somatic dysfunction of lumbar region: Secondary | ICD-10-CM

## 2022-10-19 DIAGNOSIS — M40204 Unspecified kyphosis, thoracic region: Secondary | ICD-10-CM

## 2022-10-19 DIAGNOSIS — M9904 Segmental and somatic dysfunction of sacral region: Secondary | ICD-10-CM | POA: Diagnosis not present

## 2022-10-19 DIAGNOSIS — M9908 Segmental and somatic dysfunction of rib cage: Secondary | ICD-10-CM

## 2022-10-19 DIAGNOSIS — M9902 Segmental and somatic dysfunction of thoracic region: Secondary | ICD-10-CM | POA: Diagnosis not present

## 2022-10-19 NOTE — Assessment & Plan Note (Signed)
Increasing tightness noted since seen patient.  Patient has not been as active secondary to the surgery and the mastoidectomy.  Discussed with patient about icing regimen and home exercises.  Encourage patient to continue to discuss with other physicians about the intermittent headaches he is having 0.7 quite severe.  Still need to consider the possibility of the intracranial pretension.  Still is on the Lasix.  Follow-up again in 6 weeks total time discussing patient's headaches as well as discussing with patient about different follow-up procedures 31 minutes.  Patient has had significant weight gain since surgery but hopefully with increasing activity this will get better as well.  Otherwise encouraged him to discuss with his primary care provider about the possibility of weight loss medications  and modification

## 2022-10-24 ENCOUNTER — Other Ambulatory Visit (HOSPITAL_COMMUNITY): Payer: Self-pay

## 2022-10-24 MED ORDER — ADVAIR HFA 45-21 MCG/ACT IN AERO
INHALATION_SPRAY | RESPIRATORY_TRACT | 1 refills | Status: DC
  Filled 2022-10-24: qty 12, 23d supply, fill #0

## 2022-10-24 MED ORDER — CLOMIPHENE CITRATE 50 MG PO TABS
25.0000 mg | ORAL_TABLET | ORAL | 2 refills | Status: DC
  Filled 2022-10-24: qty 25, 90d supply, fill #0
  Filled 2022-11-02: qty 20, 80d supply, fill #0
  Filled 2022-11-22: qty 22, 88d supply, fill #0

## 2022-10-24 MED ORDER — OXYCODONE HCL 10 MG PO TABS
10.0000 mg | ORAL_TABLET | Freq: Three times a day (TID) | ORAL | 0 refills | Status: DC
Start: 2022-10-24 — End: 2022-11-23
  Filled 2022-10-24 – 2022-11-03 (×3): qty 90, 30d supply, fill #0

## 2022-10-24 MED ORDER — AMPHETAMINE-DEXTROAMPHETAMINE 30 MG PO TABS
30.0000 mg | ORAL_TABLET | Freq: Two times a day (BID) | ORAL | 0 refills | Status: DC
Start: 2022-10-24 — End: 2022-11-23
  Filled 2022-10-24: qty 75, 38d supply, fill #0
  Filled 2022-10-26: qty 75, 30d supply, fill #0

## 2022-10-24 MED ORDER — ALBUTEROL SULFATE HFA 108 (90 BASE) MCG/ACT IN AERS
2.0000 | INHALATION_SPRAY | RESPIRATORY_TRACT | 1 refills | Status: DC | PRN
  Filled 2022-10-24: qty 6.7, 16d supply, fill #0
  Filled 2022-11-22: qty 6.7, 16d supply, fill #1

## 2022-10-24 MED ORDER — TRAZODONE HCL 50 MG PO TABS
50.0000 mg | ORAL_TABLET | Freq: Every day | ORAL | 0 refills | Status: DC
  Filled 2022-10-24: qty 180, 90d supply, fill #0

## 2022-10-25 ENCOUNTER — Encounter: Payer: Self-pay | Admitting: Primary Care

## 2022-10-25 ENCOUNTER — Ambulatory Visit (INDEPENDENT_AMBULATORY_CARE_PROVIDER_SITE_OTHER): Payer: BC Managed Care – PPO | Admitting: Primary Care

## 2022-10-25 ENCOUNTER — Other Ambulatory Visit: Payer: Self-pay | Admitting: Primary Care

## 2022-10-25 ENCOUNTER — Other Ambulatory Visit (HOSPITAL_COMMUNITY): Payer: Self-pay

## 2022-10-25 VITALS — BP 140/90 | HR 91 | Ht 72.0 in | Wt 316.2 lb

## 2022-10-25 DIAGNOSIS — G4733 Obstructive sleep apnea (adult) (pediatric): Secondary | ICD-10-CM | POA: Diagnosis not present

## 2022-10-25 DIAGNOSIS — J329 Chronic sinusitis, unspecified: Secondary | ICD-10-CM

## 2022-10-25 DIAGNOSIS — J40 Bronchitis, not specified as acute or chronic: Secondary | ICD-10-CM | POA: Diagnosis not present

## 2022-10-25 MED ORDER — ADVAIR HFA 45-21 MCG/ACT IN AERO: 2.0000 | INHALATION_SPRAY | Freq: Two times a day (BID) | RESPIRATORY_TRACT | 1 refills | Status: DC

## 2022-10-25 NOTE — Assessment & Plan Note (Addendum)
-   S/p right tympanomastoidectomy and tube placement on July 30th - Following with ENT

## 2022-10-25 NOTE — Patient Instructions (Addendum)
Great seeing you, glad you are doing well since your surgery Sleep apnea is well-controlled on current pressure settings, you are having no significant residual apneas  Continue to wear CPAP nightly 4 to 6 hours or longer Continue Advair 2 puffs twice daily (Rinse mouth after use) Use albuterol rescue inhaler 2 puffs every 4-6 hours as needed for breakthrough shortness of breath or wheezing Use Flonase and Astelin nasal spray as needed for nasal congestion  Follow-up 6 months or sooner if Beth NP or sooner if needed (recall placed)

## 2022-10-25 NOTE — Assessment & Plan Note (Signed)
-   Stable; He had a break in therapy after having ear/sinus surgery. He has been cleared by ENT to resume use. 90 day download showed 60% compliance with use >4 hours. Average usage days used 7 hours 21 mins. Pressure 8-18cm h20 (13.9cm h20-95%); Residual AHI 2.1/hour. He reports significant benefit from wearing. Sleeping well. No residual daytime sleepiness. No changes. FU in 6 months or sooner if needed.

## 2022-10-25 NOTE — Progress Notes (Signed)
@Patient  ID: Frank [name], male    DOB: 1991-01-01, 32 y.o.   MRN: [id number]  Chief Complaint  Patient presents with   Follow-up    Cpap f/u     Referring provider: Porfirio Oar, PA  HPI: 32 year old male, never smoked. PMH significant for ADD, OCD, moderate OSA.   Previous [facility] pulmonary encounter:  05/04/2021 Patient presents today for sleep consult. He had sleep study in 2016 that showed mild OSA, AHI was 5.4. He was never started on treatment. He has symptoms of loud snoring, witness apnea, restless sleep and daytime sleepiness. He has a family hx sleep apnea. Wife states that his oxygen will drop into the 60s. Typical bedtime is between 8-11pm. He takes 50mg  trazodone for insomnia at bedtime, he has prn Ambien which he uses very infrequently. He wakes up multiple times at night, previously thought this was due to his chronic back pain. He starts his day at 4:30 or 6am. No morning grogginess but reports daytime sleepiness in the afternoon. Drinking a lot of caffeine to stay awake. He takes adderall 15mg  as needed mostly when having to drive, he does not use every day. He has been having palpitations and dizziness. Denies narcolepsy, cataplexy or sleep walking.   Sleep questionnaire: Symptoms- Loud snoring, waking up gasping for air, daytime sleepiness  Prior sleep study- Yes, early 9s (results available) Bedtime- 8-11pm Time to fall asleep- minutes to hours  Nocturnal awakenings- too many times  Out of bed/start of day- 4:30 or 6am  Weight changes- Fluctuates  Do you operate heavy machinery- No Do you currently wear CPAP-No  Do you current wear oxygen- No  Epworth - 17  06/22/2021 Patient contacted today for follow-up. Accompanied by wife on virtual video visit. He had a home sleep test on 06/03/21 that showed moderate obstructive sleep apnea, AHI 23.4/hr with SpO2 low 70% (average 90%). Reviewed treatment options. His daytime sleepiness has worsened, he would like to try  CPAP. He is no longer taking Ambien to sleep, remains on trazodone 50mg  at bedtime. He is taking Adderall 15mg  TID as needed for ADD symptoms.  Moderate OSA: - Patient has symptoms of loud snoring, waking up gasping for air and daytime sleepiness. Home sleep study on 06/03/21 >> AHI 23.4/hr with SpO2 low 70%. Reviewed treatment options including weight loss, oral appliance, CPAP therapy or referral to ENT. Due to severe of symptoms and daytime sleepiness recommended starting CPAP therapy, patient is agreement with plan.  DME referral placed for new CPAP start auto titrate 5 to 15 cm H2O with mask of choice. Encourage patient aim to wear CPAP every night min 4-6 hours or longer. Advised against driving if experiencing excessive daytime sleepiness. FU in 8 weeks for compliance check.   Orders: Start CPAP 5-15cm h20 with mask of choice/chin strap    09/06/2021 Patient presents today for 8-week follow-up OSA.  Symptoms of loud snoring, waking up gasping for air and daytime sleepiness.  Home sleep study on 06/03/2021 showed moderate obstructive sleep apnea, AHI 23.4 an hour.  During last visit patient was started on CPAP.  Patient was started on CPAP and has been wearing consistently the last 30 days.  He reports significant benefit in sleep quality and energy level since using.  He is able to sleep through the night and is not as tired the next day.  He is getting good rest.  He is having less chronic pain symptoms and has been able to decrease opioids.  He is feeling a lot better.  Not needing to drink as much caffeine.  Mind is clear.  He recently developed URI symptoms 2 weeks ago and has been having more difficulty wearing his CPAP due to nasal congestion.  He has an associated productive cough with yellow mucus and wheezing.  His 42-year-old has been sick on and off and plans on getting ear tubes with ENT in August.  Airview download 08/07/2021 - 09/05/2021 Usage days 30/30 days (100%); 26 days (87%): 4  hours Average usage 6 hours 6 minutes Pressure 5 to 15 cm H2O (13.4 cm H2O-95%) Airleaks 1.4L/min (95%) AHI 1.2   02/10/2022 Patient contacted today virtually for for 4-6 month follow-up. Recurrent bronchitis. Associated PND and wheezing. He gets most viral illness his son will get. Cough has been worse last 4 weeks ago. He had an ear infection at the same time. He saw ENT. He has been on 14 day course Augmentin and two rounds of prednisone. He uses Astelin/Fluticasone nasal spray at night. CPAP is working well. He feels more rested.   Airview download 12/10/21-02/07/22 60/60 days (100%); 52 days (87%) > 4 hours Average usage 6 hours 16 mins Pressure 8-18cm h20 (12.9cm h20-95%) Airleaks 16.8L/min AHI 2.4  OSA: - Patient is compliant with CPAP and reports benefit from use  - Pressure 8-18cm h20; Residual AHI 2.4 - No changes  - Continue to encourage patient wear CPAP nightly  Chronic sinusitis with recurrent bronchitis - Not acutely flared. Eos 300. Maintained on Astelin/fluticasone nasal spray. Starting Singulair 10mg  at bedtime. Checking PFTs. Following with ENT.    05/12/2022 Patient presents today for 37-month follow-up.  He has sleep apnea and chronic sinusitis with recurrent bronchitis.  During her last office visit he was started on Singulair 10 mg at bedtime. Pulmonary function testing in January showed normal lung function, no evidence of obstructive lung disease. He is having issues with chronic sinusitis and hearing. Continue to follow with ear nose and throat. Associated chest tightness. Using Albuterol more frequently than he'd like upwards of 5-6 times a day with temporary improvement. He is getting up mucus. CXR on 05/03/22 showed clear lungs.   He is doing well sleep wise. He is 97% compliant with CPAP use greater than 4 hours over the last 30 days.  Current pressure 8 to 18 cm H2O; residual AHI 1.3/hour. He takes adderrall 30mg  daily and 15mg  as needed.   Airview download  04/11/22-05/10/22 Usage days 29/30 days (97%) greater than 4 hours Average usage 6 hours 44 minutes Pressure 8 to 18 cm H2O (13.7 cm H2O-95%) Air leaks 22.8 L/min (95%) AHI 1.3  Moderate obstructive sleep apnea - HST 06/03/21 >> moderate obstructive sleep apnea, AHI 23.4/hr with SpO2 low 70% (average 90%). Patient reports clinical benefit in sleep quality and daytime fatigue. He is drinking less caffeine. Current CPAP pressure 8-18cm h20; Residual AHI 1.3/hour. No changes today.  Sinew to encourage patient aim to wear CPAP nightly for 4 to 6 hours or longer.  Encouraged weight loss efforts.  Follow-up in 6 months or sooner if needed.   Chronic sinusitis with recurrent bronchitis - Never smoked; Recurrent sinusitis - Continue Astelin/fluticasone nasal spray; Singulair 10mg  at bedtime - Continue follow with ENT    Asthma - Patient has clincal symptoms of shortness of breath with exertion and chest tightness. Reports temporary improvement with SABA - PFTs were normal in January 2024 without evidence of obstructive airway disease, there was evidence of air trapping - CXR in  March 2024 showed clear lungs  - Recommend trial low dose ICS/LABA - Advair 45-62mcg two puffs q 12 hours  - FU in 4 months or sooner if needed   10/25/2022- Interim hx  Patient presents today for 6 month follow-up OSA and asthma. Home sleep study on 06/03/2021 showed moderate obstructive sleep apnea, AHI 23.4 an hour. Maintained on Auto CPAP. He had right tympanomastoidectomy and tube placement on July 30th with [facility] with Dr. Ignacia Felling. His hearing is better. Still having's some drainage. He has some minor nasal congestion which he reports is not as bad as it was previously. He is able to use flonase and Astelin as needed. He was cleared to resume CPAP last week. Tolerating pressure alright. Sleeping well. He reports significant benefit in sleep quality with use, he was miserable being off CPAP post op.   His breathing  improved with ICS/LABA, he can see a difference when not using Not experiencing significant shortness of breath or chest tightness  Using Advair 45-73mcg two puffs twice daily  Rarely using SABA  Airview download 07/27/2022 - 10/24/2022 Usage 55/90 days (61%) > 4 hours Usage days used 7 hours 21 mins Pressure 8-18cm h20 (13.9cm h20) Airleaks 32.9L (95%) AHI 2.1   Allergies  Allergen Reactions   Vueway [Gadopiclenol] Nausea Only    Immunization History  Administered Date(s) Administered   COVID-19, mRNA, vaccine(Comirnaty)12 years and older 12/16/2021   Influenza Inj Mdck Quad Pf 12/16/2021   Influenza,inj,Quad PF,6+ Mos 01/17/2016, 10/13/2016   Influenza,inj,quad, With Preservative 12/03/2017   Meningococcal polysaccharide vaccine (MPSV4) 02/13/2009   PFIZER(Purple Top)SARS-COV-2 Vaccination 04/15/2019, 05/16/2019, 01/03/2020   Tdap 02/13/2009, 12/21/2017    Past Medical History:  Diagnosis Date   ADD (attention deficit disorder)    Anxiety    Narcolepsy through school    Tobacco History: Social History   Tobacco Use  Smoking Status Never  Smokeless Tobacco Never   Counseling given: Not Answered   Outpatient Medications Prior to Visit  Medication Sig Dispense Refill   ADVAIR HFA 45-21 MCG/ACT inhaler Inhale 2 puffs into the lungs 2 (two) times daily. 12 g 1   ADVAIR HFA 45-21 MCG/ACT inhaler Inhale 2 puffs into the lungs 2 (two) times daily. 12 g 1   albuterol (VENTOLIN HFA) 108 (90 Base) MCG/ACT inhaler Inhale 2 puffs into the lungs every 4 (four) hours as needed for wheezing or shortness of breath. 6.7 g 1   albuterol (VENTOLIN HFA) 108 (90 Base) MCG/ACT inhaler Inhale 2 puffs into the lungs every 4 (four) hours as needed for wheezing or shortness of breath. 6.7 g 1   amphetamine-dextroamphetamine (ADDERALL) 30 MG tablet Take 1 tablet by mouth twice daily, may take additional 1/2 tablet as needed. 75 tablet 0   Azelastine HCl 137 MCG/SPRAY SOLN Place 2 sprays into  each nostril 2 (two) times daily as needed (nasal symptoms). 30 mL 99   benzonatate (TESSALON) 200 MG capsule Take 1 capsule (200 mg total) by mouth 3 (three) times daily as needed for cough 20 capsule 0   buPROPion (WELLBUTRIN XL) 300 MG 24 hr tablet Take 1 tablet (300 mg total) by mouth daily. 90 tablet 3   Cholecalciferol (VITAMIN D3) 1.25 MG (50000 UT) CAPS Take 1 capsule by mouth once a week at 9am. 12 capsule 3   clomiPHENE (CLOMID) 50 MG tablet Take 1/2 tablet (25 mg dose) by mouth every other day. 30 tablet 0   clomiPHENE (CLOMID) 50 MG tablet Take 0.5 tablets (25  mg total) by mouth every other day. 30 tablet 2   cyclobenzaprine (FLEXERIL) 5 MG tablet Take 5-10 mg by mouth at bedtime as needed.     cyclobenzaprine (FLEXERIL) 5 MG tablet Take 1 tablet (5 mg total) by mouth 2 (two) times daily. (Not with methocarbamol, but if methocarbamol isn't effective) 30 tablet 0   DULoxetine (CYMBALTA) 20 MG capsule Take 2 capsules (40 mg total) by mouth daily. 60 capsule 0   fluticasone-salmeterol (ADVAIR HFA) 45-21 MCG/ACT inhaler INHALE 2 PUFFS INTO THE LUNGS TWICE A DAY 12 each 3   levothyroxine (SYNTHROID) 25 MCG tablet Take 1 tablet (25 mcg total) by mouth daily. 90 tablet 1   levothyroxine (SYNTHROID) 25 MCG tablet Take 1 tablet (25 mcg total) by mouth daily. 90 tablet 1   methocarbamol (ROBAXIN) 500 MG tablet Take 1 tablet (500 mg total) by mouth 4 (four) times daily as needed (muscle pain, spasm) 120 tablet 1   montelukast (SINGULAIR) 10 MG tablet TAKE 1 TABLET BY MOUTH EVERYDAY AT BEDTIME 90 tablet 3   Oxycodone HCl 10 MG TABS Take 1 tablet (10 mg total) by mouth every 8 (eight) hours as needed for pain. Max daily amount: 30 mg. 90 tablet 0   Oxycodone HCl 10 MG TABS Take 1 tablet (10 mg) by mouth every 8 hours as needed for pain 90 tablet 0   Oxycodone HCl 10 MG TABS Take 1 tablet (10 mg dose) by mouth every 8 (eight) hours as needed for pain. 90 tablet 0   traZODone (DESYREL) 50 MG tablet  TAKE 1/2 TO 1 TABLET BY MOUTH AT BEDTIME AS NEEDED FOR SLEEP 90 tablet 2   traZODone (DESYREL) 50 MG tablet Take 1-2 tablets (50-100 mg total) by mouth at bedtime. 180 tablet 0   No facility-administered medications prior to visit.   Review of Systems  Review of Systems  Constitutional: Negative.   HENT:  Positive for congestion. Negative for ear pain, facial swelling, sinus pressure and sinus pain.   Respiratory: Negative.    Cardiovascular: Negative.      Physical Exam  BP (!) 140/90   Pulse 91   Ht 6' (1.829 m)   Wt (!) 316 lb 3.2 oz (143.4 kg)   SpO2 98%   BMI 42.88 kg/m  Physical Exam Constitutional:      Appearance: Normal appearance.  HENT:     Head: Normocephalic and atraumatic.     Left Ear: Tympanic membrane normal.     Ears:     Comments: Right post auricular incision OTA, CDI without signs of infection    Mouth/Throat:     Mouth: Mucous membranes are moist.     Pharynx: Oropharynx is clear.  Cardiovascular:     Rate and Rhythm: Normal rate and regular rhythm.  Pulmonary:     Effort: Pulmonary effort is normal.     Breath sounds: Normal breath sounds.  Skin:    General: Skin is warm and dry.  Neurological:     General: No focal deficit present.     Mental Status: He is alert and oriented to person, place, and time. Mental status is at baseline.  Psychiatric:        Mood and Affect: Mood normal.        Behavior: Behavior normal.        Thought Content: Thought content normal.        Judgment: Judgment normal.      Lab Results:  CBC    Component  Value Date/Time   WBC 6.7 08/16/2022 0845   RBC 4.76 08/16/2022 0845   HGB 15.3 08/16/2022 0845   HCT 43.9 08/16/2022 0845   PLT 167.0 08/16/2022 0845   MCV 92.3 08/16/2022 0845   MCH 31.6 04/24/2015 1148   MCHC 34.7 08/16/2022 0845   RDW 13.0 08/16/2022 0845   LYMPHSABS 1.8 08/16/2022 0845   MONOABS 0.5 08/16/2022 0845   EOSABS 0.4 08/16/2022 0845   BASOSABS 0.0 08/16/2022 0845    BMET     Component Value Date/Time   NA 137 08/16/2022 0845   NA 144 01/17/2016 0945   K 3.8 08/16/2022 0845   CL 105 08/16/2022 0845   CO2 26 08/16/2022 0845   GLUCOSE 99 08/16/2022 0845   BUN 18 08/16/2022 0845   BUN 25 (H) 01/17/2016 0945   CREATININE 1.15 08/16/2022 0845   CREATININE 0.92 04/24/2015 1148   CALCIUM 9.2 08/16/2022 0845   GFRNONAA 110 01/17/2016 0945   GFRAA 127 01/17/2016 0945    BNP No results found for: "BNP"  ProBNP No results found for: "PROBNP"  Imaging: No results found.   Assessment & Plan:   Moderate obstructive sleep apnea - Stable; He had a break in therapy after having ear/sinus surgery. He has been cleared by ENT to resume use. 90 day download showed 60% compliance with use >4 hours. Average usage days used 7 hours 21 mins. Pressure 8-18cm h20 (13.9cm h20-95%); Residual AHI 2.1/hour. He reports significant benefit from wearing. Sleeping well. No residual daytime sleepiness. No changes. FU in 6 months or sooner if needed.   Asthma - Improved; No longer experiencing persistent shortness of breath or chest tightness symptoms. Rarely requiring SABA  - Continue Advair 45-57mcg two puffs twice daily and Albuterol hfa 2 puffs every 4-6 hours for breakthrough shortness of breath/wheezing   Chronic sinusitis with recurrent bronchitis - S/p right tympanomastoidectomy and tube placement on July 30th - Following with ENT   Glenford Bayley, NP 10/25/2022

## 2022-10-25 NOTE — Addendum Note (Signed)
Addended by: Glenford Bayley on: 10/25/2022 04:53 PM   Modules accepted: Orders

## 2022-10-25 NOTE — Assessment & Plan Note (Addendum)
-   Improved; No longer experiencing persistent shortness of breath or chest tightness symptoms. Rarely requiring SABA  - Continue Advair 45-21mcg two puffs twice daily and Albuterol hfa 2 puffs every 4-6 hours for breakthrough shortness of breath/wheezing

## 2022-10-26 ENCOUNTER — Other Ambulatory Visit: Payer: Self-pay | Admitting: Family Medicine

## 2022-10-26 ENCOUNTER — Other Ambulatory Visit (HOSPITAL_COMMUNITY): Payer: Self-pay

## 2022-10-26 NOTE — Progress Notes (Signed)
Reviewed and agree with assessment/plan.   Coralyn Helling, MD [facility] 10/26/2022, 1:11 PM Pager:  [phone]

## 2022-10-27 ENCOUNTER — Other Ambulatory Visit (HOSPITAL_COMMUNITY): Payer: Self-pay

## 2022-10-30 ENCOUNTER — Other Ambulatory Visit (HOSPITAL_COMMUNITY): Payer: Self-pay

## 2022-10-30 NOTE — Telephone Encounter (Signed)
Per test claim PA is not needed and is refill too soon.

## 2022-11-02 ENCOUNTER — Other Ambulatory Visit (HOSPITAL_COMMUNITY): Payer: Self-pay

## 2022-11-02 NOTE — Progress Notes (Signed)
[name] [address] [place] [address] Phone: [phone] Subjective:   Frank [name], am serving as a scribe for Dr. Antoine Primas.  I'm seeing this patient by the request  of:  Porfirio Oar, PA  CC:   [id number]  Frank [name] is a 32 y.o. male coming in with complaint of back and neck pain. OMT 10/19/2022. Patient states did well on trip. Needs manipulations since travelling back. Would like a cocktail injection. Thinks he's had vertigo for the last 3 hours.  Medications patient has been prescribed: Cymbalta, Trazadone  Taking:         Reviewed prior external information including notes and imaging from previsou exam, outside providers and external EMR if available.   As well as notes that were available from care everywhere and other healthcare systems.  Past medical history, social, surgical and family history all reviewed in electronic medical record.  No pertanent information unless stated regarding to the chief complaint.   Past Medical History:  Diagnosis Date   ADD (attention deficit disorder)    Anxiety    Narcolepsy through school    Allergies  Allergen Reactions   Vueway [Gadopiclenol] Nausea Only     Review of Systems:  No headache, visual changes, nausea, vomiting, diarrhea, constipation, dizziness, abdominal pain, skin rash, fevers, chills, night sweats, weight loss, swollen lymph nodes, body aches, joint swelling, chest pain, shortness of breath, mood changes. POSITIVE muscle aches  Objective  Blood pressure 124/86, pulse 73, height 6' (1.829 m), weight (!) 313 lb (142 kg), SpO2 98%.   General: No apparent distress alert and oriented x3 mood and affect normal, dressed appropriately.  HEENT: Pupils equal, extraocular movements intact  Respiratory: Patient's speak in full sentences and does not appear short of breath  Cardiovascular: No lower extremity edema, non tender, no erythema  MSK:  Back  does have some decrease in lordosis of the back.  Discussed icing regimen of home exercises, discussed which activities to determine which ones to avoid.  Increase activity slowly.  Osteopathic findings  C3 flexed rotated and side bent right C7 flexed rotated and side bent left T3 extended rotated and side bent right inhaled rib T7 extended rotated and side bent left L3 flexed rotated and side bent left Sacrum right on right    Assessment and Plan:  Scheuermann's disease Continue to be active, discussed icing regimen and home exercises, discussed which activities to do and which ones to avoid, increase activity slowly over the course of the next several weeks.  Increase core strengthening.  Patient is still working on weight loss.  Follow-up again in 6 to 8 weeks otherwise.  Sinusitis, chronic Still recovering from the surgery for the mastoidectomy.  Patient has follow-up with surgeon in 3 weeks.  Very mild headaches.  Has discontinued the Lasix on his own because he felt like it was giving him more hypokalemia causing cramping.  Patient knows to restart if he notices more headaches or dizziness.    Nonallopathic problems  Decision today to treat with OMT was based on Physical Exam  After verbal consent patient was treated with HVLA, ME, FPR techniques in cervical, rib, thoracic, lumbar, and sacral  areas  Patient tolerated the procedure well with improvement in symptoms  Patient given exercises, stretches and lifestyle modifications  See medications in patient instructions if given  Patient will follow up in 4-8 weeks     The above documentation has been reviewed and is  accurate and complete [name], DO         Note: This dictation was prepared with Dragon dictation along with smaller phrase technology. Any transcriptional errors that result from this process are unintentional.

## 2022-11-03 ENCOUNTER — Other Ambulatory Visit (HOSPITAL_COMMUNITY): Payer: Self-pay

## 2022-11-03 ENCOUNTER — Other Ambulatory Visit (HOSPITAL_BASED_OUTPATIENT_CLINIC_OR_DEPARTMENT_OTHER): Payer: Self-pay

## 2022-11-06 ENCOUNTER — Ambulatory Visit (INDEPENDENT_AMBULATORY_CARE_PROVIDER_SITE_OTHER): Payer: BC Managed Care – PPO | Admitting: Family Medicine

## 2022-11-06 VITALS — BP 124/86 | HR 73 | Ht 72.0 in | Wt 313.0 lb

## 2022-11-06 DIAGNOSIS — M4200 Juvenile osteochondrosis of spine, site unspecified: Secondary | ICD-10-CM | POA: Diagnosis not present

## 2022-11-06 DIAGNOSIS — M9902 Segmental and somatic dysfunction of thoracic region: Secondary | ICD-10-CM

## 2022-11-06 DIAGNOSIS — M9908 Segmental and somatic dysfunction of rib cage: Secondary | ICD-10-CM | POA: Diagnosis not present

## 2022-11-06 DIAGNOSIS — M9904 Segmental and somatic dysfunction of sacral region: Secondary | ICD-10-CM

## 2022-11-06 DIAGNOSIS — M9901 Segmental and somatic dysfunction of cervical region: Secondary | ICD-10-CM

## 2022-11-06 DIAGNOSIS — M9903 Segmental and somatic dysfunction of lumbar region: Secondary | ICD-10-CM

## 2022-11-06 MED ORDER — KETOROLAC TROMETHAMINE 60 MG/2ML IM SOLN
60.0000 mg | Freq: Once | INTRAMUSCULAR | Status: AC
Start: 2022-11-06 — End: 2022-11-06
  Administered 2022-11-06: 60 mg via INTRAMUSCULAR

## 2022-11-06 MED ORDER — METHYLPREDNISOLONE ACETATE 80 MG/ML IJ SUSP
80.0000 mg | Freq: Once | INTRAMUSCULAR | Status: AC
Start: 2022-11-06 — End: 2022-11-06
  Administered 2022-11-06: 80 mg via INTRAMUSCULAR

## 2022-11-06 NOTE — Patient Instructions (Addendum)
Cocktail injection today Glad trip went well over all See you again in 4-6 weeks

## 2022-11-06 NOTE — Assessment & Plan Note (Signed)
Still recovering from the surgery for the mastoidectomy.  Patient has follow-up with surgeon in 3 weeks.  Very mild headaches.  Has discontinued the Lasix on his own because he felt like it was giving him more hypokalemia causing cramping.  Patient knows to restart if he notices more headaches or dizziness.

## 2022-11-06 NOTE — Assessment & Plan Note (Signed)
Continue to be active, discussed icing regimen and home exercises, discussed which activities to do and which ones to avoid, increase activity slowly over the course of the next several weeks.  Increase core strengthening.  Patient is still working on weight loss.  Follow-up again in 6 to 8 weeks otherwise.

## 2022-11-08 DIAGNOSIS — G4733 Obstructive sleep apnea (adult) (pediatric): Secondary | ICD-10-CM | POA: Diagnosis not present

## 2022-11-13 DIAGNOSIS — E230 Hypopituitarism: Secondary | ICD-10-CM | POA: Diagnosis not present

## 2022-11-14 DIAGNOSIS — G4733 Obstructive sleep apnea (adult) (pediatric): Secondary | ICD-10-CM | POA: Diagnosis not present

## 2022-11-22 ENCOUNTER — Other Ambulatory Visit (HOSPITAL_COMMUNITY): Payer: Self-pay

## 2022-11-22 ENCOUNTER — Other Ambulatory Visit: Payer: Self-pay | Admitting: Family Medicine

## 2022-11-22 MED ORDER — METHOCARBAMOL 500 MG PO TABS
500.0000 mg | ORAL_TABLET | Freq: Four times a day (QID) | ORAL | 1 refills | Status: DC | PRN
  Filled 2022-11-22: qty 120, 30d supply, fill #0
  Filled 2023-01-17: qty 120, 30d supply, fill #1

## 2022-11-22 MED ORDER — BENZONATATE 200 MG PO CAPS
200.0000 mg | ORAL_CAPSULE | Freq: Three times a day (TID) | ORAL | 0 refills | Status: AC | PRN
  Filled 2022-11-22: qty 20, 7d supply, fill #0

## 2022-11-22 MED ORDER — DULOXETINE HCL 20 MG PO CPEP
40.0000 mg | ORAL_CAPSULE | Freq: Every day | ORAL | 0 refills | Status: DC
  Filled 2022-11-22: qty 60, 30d supply, fill #0

## 2022-11-23 ENCOUNTER — Other Ambulatory Visit (HOSPITAL_COMMUNITY): Payer: Self-pay

## 2022-11-23 MED ORDER — OXYCODONE HCL 10 MG PO TABS
10.0000 mg | ORAL_TABLET | Freq: Three times a day (TID) | ORAL | 0 refills | Status: DC | PRN
  Filled 2022-11-25 – 2022-12-01 (×3): qty 90, 30d supply, fill #0

## 2022-11-23 MED ORDER — AMPHETAMINE-DEXTROAMPHETAMINE 30 MG PO TABS
30.0000 mg | ORAL_TABLET | Freq: Two times a day (BID) | ORAL | 0 refills | Status: DC
  Filled 2022-11-23 – 2022-11-25 (×2): qty 75, 30d supply, fill #0

## 2022-11-23 NOTE — Progress Notes (Signed)
[name] [address] [place] [address] Phone: [phone] Subjective:   Frank [name], am serving as a scribe for Dr. Antoine Primas.  I'm seeing this patient by the request  of:  Porfirio Oar, PA  CC: Back and neck pain follow-up  [id number]  Frank [name] is a 32 y.o. male coming in with complaint of back and neck pain. OMT 10/19/2022. Patient states everything is hurting. Another epidural? No new concerns.  Medications patient has been prescribed: Cymbalta  Taking:         Reviewed prior external information including notes and imaging from previsou exam, outside providers and external EMR if available.   As well as notes that were available from care everywhere and other healthcare systems.  Past medical history, social, surgical and family history all reviewed in electronic medical record.  No pertanent information unless stated regarding to the chief complaint.   Past Medical History:  Diagnosis Date   ADD (attention deficit disorder)    Anxiety    Narcolepsy through school    Allergies  Allergen Reactions   Vueway [Gadopiclenol] Nausea Only     Review of Systems:  No headache, visual changes, nausea, vomiting, diarrhea, constipation, dizziness, abdominal pain, skin rash, fevers, chills, night sweats, weight loss, swollen lymph nodes, body aches, joint swelling, chest pain, shortness of breath, mood changes. POSITIVE muscle aches  Objective  Blood pressure 128/84, pulse 88, height 6' (1.829 m), weight (!) 313 lb (142 kg), SpO2 97%.   General: No apparent distress alert and oriented x3 mood and affect normal, dressed appropriately.  HEENT: Pupils equal, extraocular movements intact  Respiratory: Patient's speak in full sentences and does not appear short of breath  Cardiovascular: No lower extremity edema, non tender, no erythema  Thoracic spine does have significant tightness noted.  Patient does have  multiple areas of trigger points noted in the rhomboid as well as the spinalis muscles.  After verbal consent patient was prepped with alcohol swab and with a 25-gauge 1 inch needle injected in 4 distinct trigger points.  Total of 3 cc of 0.5% Marcaine and 1 cc of Kenalog 40 mg/mL used.  Minimal blood loss, Band-Aid placed.  Postinjection instructions given.  Osteopathic findings  C2 flexed rotated and side bent right C6 flexed rotated and side bent left T3 extended rotated and side bent right inhaled rib T9 extended rotated and side bent left T11 flexed rotated and side bent right L1 flexed rotated and side bent right Sacrum right on right       Assessment and Plan:  Scheuermann's disease I believe the patient did have an exacerbation.  Toradol and Depo-Medrol given today, discussed icing regimen at home or exercises.  Discussed increasing activity slowly.  Patient did get have trigger point injections today and hopeful that this will make some improvement as well.  Increase activity is slowly.  Follow-up again in 6 to 8 weeks.  Trigger point of thoracic region Injection given today and tolerated the procedure well, discussed icing regimen and home exercises, discussed which activities to do and which ones to avoid.  Increase activity slowly otherwise.  Follow-up again in 6 to 8 weeks.    Nonallopathic problems  Decision today to treat with OMT was based on Physical Exam  After verbal consent patient was treated with HVLA, ME, FPR techniques in cervical, rib, thoracic, lumbar, and sacral  areas  Patient tolerated the procedure well with improvement in symptoms  Patient  given exercises, stretches and lifestyle modifications  See medications in patient instructions if given  Patient will follow up in 4-8 weeks     The above documentation has been reviewed and is accurate and complete [name], DO         Note: This dictation was prepared with Dragon dictation  along with smaller phrase technology. Any transcriptional errors that result from this process are unintentional.

## 2022-11-25 ENCOUNTER — Other Ambulatory Visit (HOSPITAL_COMMUNITY): Payer: Self-pay

## 2022-11-28 ENCOUNTER — Other Ambulatory Visit: Payer: Self-pay

## 2022-11-28 ENCOUNTER — Other Ambulatory Visit (HOSPITAL_COMMUNITY): Payer: Self-pay

## 2022-11-29 ENCOUNTER — Ambulatory Visit: Payer: BC Managed Care – PPO | Admitting: Family Medicine

## 2022-11-29 ENCOUNTER — Encounter: Payer: Self-pay | Admitting: Family Medicine

## 2022-11-29 ENCOUNTER — Ambulatory Visit (INDEPENDENT_AMBULATORY_CARE_PROVIDER_SITE_OTHER): Payer: BC Managed Care – PPO

## 2022-11-29 VITALS — BP 128/84 | HR 88 | Ht 72.0 in | Wt 313.0 lb

## 2022-11-29 DIAGNOSIS — M4200 Juvenile osteochondrosis of spine, site unspecified: Secondary | ICD-10-CM

## 2022-11-29 DIAGNOSIS — M546 Pain in thoracic spine: Secondary | ICD-10-CM | POA: Diagnosis not present

## 2022-11-29 DIAGNOSIS — M9908 Segmental and somatic dysfunction of rib cage: Secondary | ICD-10-CM | POA: Diagnosis not present

## 2022-11-29 DIAGNOSIS — R0789 Other chest pain: Secondary | ICD-10-CM

## 2022-11-29 DIAGNOSIS — M9902 Segmental and somatic dysfunction of thoracic region: Secondary | ICD-10-CM | POA: Diagnosis not present

## 2022-11-29 DIAGNOSIS — M9901 Segmental and somatic dysfunction of cervical region: Secondary | ICD-10-CM

## 2022-11-29 DIAGNOSIS — R059 Cough, unspecified: Secondary | ICD-10-CM | POA: Diagnosis not present

## 2022-11-29 DIAGNOSIS — M9904 Segmental and somatic dysfunction of sacral region: Secondary | ICD-10-CM

## 2022-11-29 DIAGNOSIS — M5416 Radiculopathy, lumbar region: Secondary | ICD-10-CM

## 2022-11-29 DIAGNOSIS — M9903 Segmental and somatic dysfunction of lumbar region: Secondary | ICD-10-CM

## 2022-11-29 MED ORDER — METHYLPREDNISOLONE ACETATE 40 MG/ML IJ SUSP
40.0000 mg | Freq: Once | INTRAMUSCULAR | Status: AC
Start: 2022-11-29 — End: 2022-11-29
  Administered 2022-11-29: 40 mg via INTRAMUSCULAR

## 2022-11-29 MED ORDER — KETOROLAC TROMETHAMINE 30 MG/ML IJ SOLN
30.0000 mg | Freq: Once | INTRAMUSCULAR | Status: AC
Start: 2022-11-29 — End: 2022-11-29
  Administered 2022-11-29: 30 mg via INTRAMUSCULAR

## 2022-11-29 NOTE — Assessment & Plan Note (Signed)
Has had difficulty with lumbar radiculopathy and will consider the possibility of another epidural.  Will put order in to see how patient potentially responds.

## 2022-11-29 NOTE — Assessment & Plan Note (Signed)
Injection given today and tolerated the procedure well, discussed icing regimen and home exercises, discussed which activities to do and which ones to avoid.  Increase activity slowly otherwise.  Follow-up again in 6 to 8 weeks.

## 2022-11-29 NOTE — Patient Instructions (Addendum)
Cocktail injection today Xray today See you again at next appointment

## 2022-11-29 NOTE — Assessment & Plan Note (Signed)
I believe the patient did have an exacerbation.  Toradol and Depo-Medrol given today, discussed icing regimen at home or exercises.  Discussed increasing activity slowly.  Patient did get have trigger point injections today and hopeful that this will make some improvement as well.  Increase activity is slowly.  Follow-up again in 6 to 8 weeks.

## 2022-11-30 ENCOUNTER — Other Ambulatory Visit (HOSPITAL_COMMUNITY): Payer: Self-pay

## 2022-12-01 ENCOUNTER — Other Ambulatory Visit (HOSPITAL_COMMUNITY): Payer: Self-pay

## 2022-12-01 ENCOUNTER — Other Ambulatory Visit: Payer: Self-pay

## 2022-12-07 DIAGNOSIS — H9193 Unspecified hearing loss, bilateral: Secondary | ICD-10-CM | POA: Diagnosis not present

## 2022-12-07 DIAGNOSIS — Z9889 Other specified postprocedural states: Secondary | ICD-10-CM | POA: Diagnosis not present

## 2022-12-07 DIAGNOSIS — R519 Headache, unspecified: Secondary | ICD-10-CM | POA: Diagnosis not present

## 2022-12-07 DIAGNOSIS — Z01118 Encounter for examination of ears and hearing with other abnormal findings: Secondary | ICD-10-CM | POA: Diagnosis not present

## 2022-12-07 DIAGNOSIS — H95191 Other disorders following mastoidectomy, right ear: Secondary | ICD-10-CM | POA: Diagnosis not present

## 2022-12-07 DIAGNOSIS — H9071 Mixed conductive and sensorineural hearing loss, unilateral, right ear, with unrestricted hearing on the contralateral side: Secondary | ICD-10-CM | POA: Diagnosis not present

## 2022-12-22 NOTE — Progress Notes (Unsigned)
[name] [address] [place] [address] Phone: [phone] Subjective:   Frank [name], am serving as a scribe for Dr. Antoine Primas.  I'm seeing this patient by the request  of:  Porfirio Oar, PA  CC: Low back and neck pain follow-up  [id number]  Frank [name] is a 32 y.o. male coming in with complaint of back and neck pain. OMT 11/29/2022. Patient states that his rib on R side has been spasming more recently. Injection did help for 1-2 days. Patient does have some relief with self adjustments at home.   Medications patient has been prescribed: Cymbalta, Trazadone  Taking:     Reviewing the patient's chart did follow-up with his surgeon for the mastoidectomy.    Reviewed prior external information including notes and imaging from previsou exam, outside providers and external EMR if available.   As well as notes that were available from care everywhere and other healthcare systems.  Past medical history, social, surgical and family history all reviewed in electronic medical record.  No pertanent information unless stated regarding to the chief complaint.   Past Medical History:  Diagnosis Date   ADD (attention deficit disorder)    Anxiety    Narcolepsy through school    Allergies  Allergen Reactions   Vueway [Gadopiclenol] Nausea Only     Review of Systems:  No  visual changes, nausea,  diarrhea, constipation, dizziness, , skin rash, fevers, chills, night sweats, weight loss, swollen lymph nodes, body aches, joint swelling, chest pain, shortness of breath, mood changes. POSITIVE muscle aches, headache, abdominal pain and vomiting x 1  Objective  Blood pressure 124/86, pulse 88, height 6' (1.829 m), weight (!) 309 lb (140.2 kg), SpO2 96%.   General: No apparent distress alert and oriented x3 mood and affect normal, dressed appropriately.  HEENT: Pupils equal, extraocular movements intact  Respiratory: Patient's  speak in full sentences and does not appear short of breath  Cardiovascular: No lower extremity edema, non tender, no erythema  Gait MSK:  Significant abdominal discomfort noted in the right upper quadrant.  Positive Murphy sign noted. Back does have some loss of lordosis noted.  Tightness noted in the thoracolumbar juncture.  Neck exam does have tightness more on the right side.  Postsurgical changes noted of the right ear.  Osteopathic findings  C2 flexed rotated and side bent right C6 flexed rotated and side bent left T3 extended rotated and side bent right inhaled rib T9 extended rotated and side bent left L2 flexed rotated and side bent right Sacrum right on right       Assessment and Plan:  New onset headache Worsening headaches, has already seen a neurologist previously.  Has had some difficulty with follow-up though.  Will send a note to see if there is anything they can do to start to help patient with symptoms.  We attempted osteopathic manipulation and hopefully make some difference.  Degeneration of intervertebral disc of thoracic region Chronic problem with exacerbation.  Discussed which activities to do and which ones to avoid.  Increase activity slowly otherwise.  Follow-up again in 6 to 8 weeks  Abdominal pain, chronic, right upper quadrant Patient does have risk factors that is consistent for possible gallbladder disease.  Will get ultrasound and labs to further evaluate patient has a fevers chills or unable to take p.o. to go to the emergency room    Nonallopathic problems  Decision today to treat with OMT was based on  Physical Exam  After verbal consent patient was treated with HVLA, ME, FPR techniques in cervical, rib, thoracic, lumbar, and sacral  areas  Patient tolerated the procedure well with improvement in symptoms  Patient given exercises, stretches and lifestyle modifications  See medications in patient instructions if given  Patient will follow  up in 4-8 weeks Toradol and Depo-Medrol given for the acute pain   The above documentation has been reviewed and is accurate and complete [name], DO          Note: This dictation was prepared with Dragon dictation along with smaller phrase technology. Any transcriptional errors that result from this process are unintentional.

## 2022-12-25 ENCOUNTER — Other Ambulatory Visit (HOSPITAL_COMMUNITY): Payer: Self-pay

## 2022-12-25 MED ORDER — BENZONATATE 200 MG PO CAPS
200.0000 mg | ORAL_CAPSULE | Freq: Three times a day (TID) | ORAL | 0 refills | Status: DC | PRN
  Filled 2022-12-25: qty 20, 7d supply, fill #0

## 2022-12-25 MED ORDER — OXYCODONE HCL 10 MG PO TABS
10.0000 mg | ORAL_TABLET | Freq: Three times a day (TID) | ORAL | 0 refills | Status: DC | PRN
  Filled 2022-12-25 – 2023-01-01 (×2): qty 90, 30d supply, fill #0

## 2022-12-25 MED ORDER — AMPHETAMINE-DEXTROAMPHETAMINE 30 MG PO TABS
ORAL_TABLET | ORAL | 0 refills | Status: DC
  Filled 2022-12-25: qty 75, 30d supply, fill #0

## 2022-12-25 MED ORDER — CLOMIPHENE CITRATE 50 MG PO TABS
25.0000 mg | ORAL_TABLET | ORAL | 2 refills | Status: AC
  Filled 2022-12-25 (×2): qty 22, 88d supply, fill #0
  Filled 2023-02-22: qty 22, 88d supply, fill #1

## 2022-12-26 ENCOUNTER — Ambulatory Visit (INDEPENDENT_AMBULATORY_CARE_PROVIDER_SITE_OTHER): Payer: BC Managed Care – PPO | Admitting: Family Medicine

## 2022-12-26 ENCOUNTER — Encounter: Payer: Self-pay | Admitting: Family Medicine

## 2022-12-26 ENCOUNTER — Other Ambulatory Visit (HOSPITAL_COMMUNITY): Payer: Self-pay

## 2022-12-26 VITALS — BP 124/86 | HR 88 | Ht 72.0 in | Wt 309.0 lb

## 2022-12-26 DIAGNOSIS — M9902 Segmental and somatic dysfunction of thoracic region: Secondary | ICD-10-CM

## 2022-12-26 DIAGNOSIS — M9904 Segmental and somatic dysfunction of sacral region: Secondary | ICD-10-CM

## 2022-12-26 DIAGNOSIS — M9903 Segmental and somatic dysfunction of lumbar region: Secondary | ICD-10-CM

## 2022-12-26 DIAGNOSIS — R1011 Right upper quadrant pain: Secondary | ICD-10-CM

## 2022-12-26 DIAGNOSIS — M9908 Segmental and somatic dysfunction of rib cage: Secondary | ICD-10-CM

## 2022-12-26 DIAGNOSIS — M9901 Segmental and somatic dysfunction of cervical region: Secondary | ICD-10-CM

## 2022-12-26 DIAGNOSIS — M2550 Pain in unspecified joint: Secondary | ICD-10-CM

## 2022-12-26 DIAGNOSIS — R519 Headache, unspecified: Secondary | ICD-10-CM | POA: Diagnosis not present

## 2022-12-26 DIAGNOSIS — M545 Low back pain, unspecified: Secondary | ICD-10-CM | POA: Diagnosis not present

## 2022-12-26 DIAGNOSIS — M5134 Other intervertebral disc degeneration, thoracic region: Secondary | ICD-10-CM

## 2022-12-26 DIAGNOSIS — G8929 Other chronic pain: Secondary | ICD-10-CM

## 2022-12-26 LAB — CBC WITH DIFFERENTIAL/PLATELET
Basophils Absolute: 0.1 10*3/uL (ref 0.0–0.1)
Basophils Relative: 0.8 % (ref 0.0–3.0)
Eosinophils Absolute: 0.8 10*3/uL — ABNORMAL HIGH (ref 0.0–0.7)
Eosinophils Relative: 10.4 % — ABNORMAL HIGH (ref 0.0–5.0)
HCT: 45.7 % (ref 39.0–52.0)
Hemoglobin: 16.2 g/dL (ref 13.0–17.0)
Lymphocytes Relative: 26.2 % (ref 12.0–46.0)
Lymphs Abs: 2 10*3/uL (ref 0.7–4.0)
MCHC: 35.4 g/dL (ref 30.0–36.0)
MCV: 93.1 fL (ref 78.0–100.0)
Monocytes Absolute: 0.6 10*3/uL (ref 0.1–1.0)
Monocytes Relative: 8.3 % (ref 3.0–12.0)
Neutro Abs: 4.2 10*3/uL (ref 1.4–7.7)
Neutrophils Relative %: 54.3 % (ref 43.0–77.0)
Platelets: 165 10*3/uL (ref 150.0–400.0)
RBC: 4.91 Mil/uL (ref 4.22–5.81)
RDW: 12.9 % (ref 11.5–15.5)
WBC: 7.7 10*3/uL (ref 4.0–10.5)

## 2022-12-26 LAB — COMPREHENSIVE METABOLIC PANEL
ALT: 55 U/L — ABNORMAL HIGH (ref 0–53)
AST: 36 U/L (ref 0–37)
Albumin: 4.4 g/dL (ref 3.5–5.2)
Alkaline Phosphatase: 53 U/L (ref 39–117)
BUN: 15 mg/dL (ref 6–23)
CO2: 23 meq/L (ref 19–32)
Calcium: 9 mg/dL (ref 8.4–10.5)
Chloride: 107 meq/L (ref 96–112)
Creatinine, Ser: 1.13 mg/dL (ref 0.40–1.50)
GFR: 86.5 mL/min (ref >60.00)
Glucose, Bld: 99 mg/dL (ref 70–99)
Potassium: 3.8 meq/L (ref 3.5–5.1)
Sodium: 139 meq/L (ref 135–145)
Total Bilirubin: 0.9 mg/dL (ref 0.2–1.2)
Total Protein: 7.1 g/dL (ref 6.0–8.3)

## 2022-12-26 LAB — SEDIMENTATION RATE: Sed Rate: 8 mm/h (ref 0–15)

## 2022-12-26 LAB — GAMMA GT: GGT: 78 U/L — ABNORMAL HIGH (ref 7–51)

## 2022-12-26 MED ORDER — KETOROLAC TROMETHAMINE 60 MG/2ML IM SOLN
60.0000 mg | Freq: Once | INTRAMUSCULAR | Status: AC
  Administered 2022-12-26: 60 mg via INTRAMUSCULAR

## 2022-12-26 MED ORDER — METHYLPREDNISOLONE ACETATE 80 MG/ML IJ SUSP
80.0000 mg | Freq: Once | INTRAMUSCULAR | Status: AC
  Administered 2022-12-26: 80 mg via INTRAMUSCULAR

## 2022-12-26 NOTE — Assessment & Plan Note (Signed)
Worsening headaches, has already seen a neurologist previously.  Has had some difficulty with follow-up though.  Will send a note to see if there is anything they can do to start to help patient with symptoms.  We attempted osteopathic manipulation and hopefully make some difference.

## 2022-12-26 NOTE — Assessment & Plan Note (Signed)
Chronic problem with exacerbation.  Discussed which activities to do and which ones to avoid.  Increase activity slowly otherwise.  Follow-up again in 6 to 8 weeks

## 2022-12-26 NOTE — Assessment & Plan Note (Signed)
Patient does have risk factors that is consistent for possible gallbladder disease.  Will get ultrasound and labs to further evaluate patient has a fevers chills or unable to take p.o. to go to the emergency room

## 2022-12-26 NOTE — Patient Instructions (Signed)
RUQ limited US Epidural Labs See me again in 6 weeks

## 2022-12-27 ENCOUNTER — Encounter: Payer: Self-pay | Admitting: Family Medicine

## 2022-12-27 ENCOUNTER — Other Ambulatory Visit: Payer: Self-pay

## 2022-12-27 DIAGNOSIS — R519 Headache, unspecified: Secondary | ICD-10-CM

## 2023-01-01 ENCOUNTER — Other Ambulatory Visit: Payer: BC Managed Care – PPO

## 2023-01-01 ENCOUNTER — Other Ambulatory Visit: Payer: Self-pay

## 2023-01-01 ENCOUNTER — Other Ambulatory Visit (HOSPITAL_COMMUNITY): Payer: Self-pay

## 2023-01-02 ENCOUNTER — Other Ambulatory Visit (HOSPITAL_COMMUNITY): Payer: Self-pay

## 2023-01-02 ENCOUNTER — Ambulatory Visit
Admission: RE | Admit: 2023-01-02 | Discharge: 2023-01-02 | Disposition: A | Discharge status: Home / Self Care | Payer: BC Managed Care – PPO | Source: Ambulatory Visit | Attending: Family Medicine | Admitting: Family Medicine

## 2023-01-02 DIAGNOSIS — R1011 Right upper quadrant pain: Secondary | ICD-10-CM

## 2023-01-04 ENCOUNTER — Other Ambulatory Visit: Payer: BC Managed Care – PPO

## 2023-01-04 ENCOUNTER — Ambulatory Visit
Admission: RE | Admit: 2023-01-04 | Discharge: 2023-01-04 | Disposition: A | Discharge status: Home / Self Care | Payer: BC Managed Care – PPO | Source: Ambulatory Visit | Attending: Family Medicine | Admitting: Family Medicine

## 2023-01-04 DIAGNOSIS — M545 Low back pain, unspecified: Secondary | ICD-10-CM

## 2023-01-04 MED ORDER — IOPAMIDOL (ISOVUE-M 200) INJECTION 41%
1.0000 mL | Freq: Once | INTRAMUSCULAR | Status: AC
  Administered 2023-01-04: 1 mL via EPIDURAL

## 2023-01-04 MED ORDER — METHYLPREDNISOLONE ACETATE 40 MG/ML INJ SUSP (RADIOLOG
80.0000 mg | Freq: Once | INTRAMUSCULAR | Status: AC
  Administered 2023-01-04: 80 mg via EPIDURAL

## 2023-01-04 NOTE — Discharge Instructions (Signed)

## 2023-01-17 ENCOUNTER — Other Ambulatory Visit: Payer: Self-pay | Admitting: Family Medicine

## 2023-01-17 ENCOUNTER — Other Ambulatory Visit (HOSPITAL_COMMUNITY): Payer: Self-pay

## 2023-01-17 ENCOUNTER — Other Ambulatory Visit: Payer: Self-pay

## 2023-01-17 MED ORDER — DULOXETINE HCL 20 MG PO CPEP
40.0000 mg | ORAL_CAPSULE | Freq: Every day | ORAL | 0 refills | Status: DC
  Filled 2023-01-17: qty 60, 30d supply, fill #0

## 2023-01-17 NOTE — Progress Notes (Unsigned)
[name] [address] [place] [address] Phone: [phone] Subjective:   Frank [name], am serving as a scribe for Dr. Antoine [name].  I'm seeing this patient by the request  of:  Porfirio Oar, PA  CC: low back and abdominal pain   [id number]  Frank [name] is a 32 y.o. male coming in with complaint of back and neck pain. OMT 12/26/2022. Patient states that he is having pain in the R side of the ribs. Has been coughing and feels that this displaced rib. Epidural was helpful. Less headaches and lower back pain.   Cramping has also improved with Kombucha and Pedialyte.   Medications patient has been prescribed: Cymbalta  Taking:         Reviewed prior external information including notes and imaging from previsou exam, outside providers and external EMR if available.   As well as notes that were available from care everywhere and other healthcare systems.  Past medical history, social, surgical and family history all reviewed in electronic medical record.  No pertanent information unless stated regarding to the chief complaint.   Past Medical History:  Diagnosis Date   ADD (attention deficit disorder)    Anxiety    Narcolepsy through school    Allergies  Allergen Reactions   Vueway [Gadopiclenol] Nausea Only     Review of Systems:  No  visual changes, nausea, vomiting, diarrhea, constipation, dizziness, abdominal pain, skin rash, fevers, chills, night sweats, weight loss, swollen lymph nodes, joint swelling, chest pain, shortness of breath, mood changes. POSITIVE muscle aches, headache, body aches  Objective  Blood pressure 128/88, pulse 89, height 6' (1.829 m), weight (!) 306 lb (138.8 kg), SpO2 97%.   General: No apparent distress alert and oriented x3 mood and affect normal, dressed appropriately.  HEENT: Pupils equal, extraocular movements intact  Respiratory: Patient's speak in full sentences and does not  appear short of breath  Cardiovascular: No lower extremity edema, non tender, no erythema  MSK:  Back does have some loss lordosis noted. Tightness noted RUQ ttp with positive murphy  Left ankle does have unfortunately tenderness over the peroneal tendon.  Mild audible popping noted.  Ankle exam shows Osteopathic findings  C2 flexed rotated and side bent right C7 flexed rotated and side bent left T3 extended rotated and side bent right inhaled rib T9 extended rotated and side bent right  L2 flexed rotated and side bent right Sacrum right on right    Assessment and Plan:  Scheuermann's disease Continues to have some chronic disc discomfort.  Discussed icing regimen and home exercises.  Discussed which activities to do and which ones to avoid.  Increase activity slowly.  Follow-up with me again in 6 to 8 weeks.  Discussed icing regimen discussed the Cymbalta at 60 mg daily Toradol and Depo-Medrol given today.  Follow-up again in 6 to 8 weeks    Nonallopathic problems  Decision today to treat with OMT was based on Physical Exam  After verbal consent patient was treated with HVLA, ME, FPR techniques in cervical, rib, thoracic, lumbar, and sacral  areas  Patient tolerated the procedure well with improvement in symptoms  Patient given exercises, stretches and lifestyle modifications  See medications in patient instructions if given  Patient will follow up in 4-8 weeks    The above documentation has been reviewed and is accurate and complete [name], DO          Note: This dictation  was prepared with Dragon dictation along with smaller phrase technology. Any transcriptional errors that result from this process are unintentional.

## 2023-01-18 ENCOUNTER — Ambulatory Visit: Payer: BC Managed Care – PPO | Admitting: Family Medicine

## 2023-01-18 ENCOUNTER — Encounter: Payer: Self-pay | Admitting: Family Medicine

## 2023-01-18 VITALS — BP 128/88 | HR 89 | Ht 72.0 in | Wt 306.0 lb

## 2023-01-18 DIAGNOSIS — M25331 Other instability, right wrist: Secondary | ICD-10-CM | POA: Diagnosis not present

## 2023-01-18 DIAGNOSIS — R1011 Right upper quadrant pain: Secondary | ICD-10-CM

## 2023-01-18 DIAGNOSIS — M9908 Segmental and somatic dysfunction of rib cage: Secondary | ICD-10-CM

## 2023-01-18 DIAGNOSIS — M9901 Segmental and somatic dysfunction of cervical region: Secondary | ICD-10-CM

## 2023-01-18 DIAGNOSIS — M9903 Segmental and somatic dysfunction of lumbar region: Secondary | ICD-10-CM

## 2023-01-18 DIAGNOSIS — M9904 Segmental and somatic dysfunction of sacral region: Secondary | ICD-10-CM | POA: Diagnosis not present

## 2023-01-18 DIAGNOSIS — G8929 Other chronic pain: Secondary | ICD-10-CM

## 2023-01-18 DIAGNOSIS — M4200 Juvenile osteochondrosis of spine, site unspecified: Secondary | ICD-10-CM | POA: Diagnosis not present

## 2023-01-18 DIAGNOSIS — M9902 Segmental and somatic dysfunction of thoracic region: Secondary | ICD-10-CM | POA: Diagnosis not present

## 2023-01-18 DIAGNOSIS — M6788 Other specified disorders of synovium and tendon, other site: Secondary | ICD-10-CM

## 2023-01-18 MED ORDER — METHYLPREDNISOLONE ACETATE 80 MG/ML IJ SUSP
80.0000 mg | Freq: Once | INTRAMUSCULAR | Status: AC
  Administered 2023-01-18: 80 mg via INTRAMUSCULAR

## 2023-01-18 MED ORDER — KETOROLAC TROMETHAMINE 60 MG/2ML IM SOLN
60.0000 mg | Freq: Once | INTRAMUSCULAR | Status: AC
  Administered 2023-01-18: 60 mg via INTRAMUSCULAR

## 2023-01-18 NOTE — Assessment & Plan Note (Signed)
Continues to have some chronic disc discomfort.  Discussed icing regimen and home exercises.  Discussed which activities to do and which ones to avoid.  Increase activity slowly.  Follow-up with me again in 6 to 8 weeks.  Discussed icing regimen discussed the Cymbalta at 60 mg daily Toradol and Depo-Medrol given today.  Follow-up again in 6 to 8 weeks

## 2023-01-18 NOTE — Assessment & Plan Note (Signed)
Continues to have some instability with the lunate bone.  Discussed potential bracing.  Responded well to manipulation again today.

## 2023-01-18 NOTE — Assessment & Plan Note (Signed)
Will continue to monitor.  He did have some elevation in the ALT, ultrasound showed that gallbladder appeared to be okay but patient did have fatty liver.  If continuing to have pain I do think a HIDA scan would be necessary.  We will discuss at further follow-up.

## 2023-01-18 NOTE — Assessment & Plan Note (Signed)
Peroneal tendinitis, exercises given, discussed icing regimen and home exercises discussed heel lift.  Work with Event organiser to her home exercises in greater detail.

## 2023-01-18 NOTE — Patient Instructions (Signed)
Ankle exercises Heel lifts Consider HIDA scan See me in 6-8 weeks

## 2023-01-22 ENCOUNTER — Other Ambulatory Visit (HOSPITAL_COMMUNITY): Payer: Self-pay

## 2023-01-22 MED ORDER — AMPHETAMINE-DEXTROAMPHETAMINE 30 MG PO TABS
30.0000 mg | ORAL_TABLET | Freq: Two times a day (BID) | ORAL | 0 refills | Status: DC
  Filled 2023-01-22 (×3): qty 75, 38d supply, fill #0
  Filled 2023-01-23: qty 75, 30d supply, fill #0

## 2023-01-22 MED ORDER — OXYCODONE HCL 10 MG PO TABS
10.0000 mg | ORAL_TABLET | Freq: Three times a day (TID) | ORAL | 0 refills | Status: DC
  Filled 2023-01-30: qty 90, 30d supply, fill #0

## 2023-01-22 MED ORDER — BENZONATATE 200 MG PO CAPS
200.0000 mg | ORAL_CAPSULE | Freq: Three times a day (TID) | ORAL | 0 refills | Status: DC
  Filled 2023-01-22: qty 20, 7d supply, fill #0

## 2023-01-22 MED ORDER — ALBUTEROL SULFATE HFA 108 (90 BASE) MCG/ACT IN AERS
2.0000 | INHALATION_SPRAY | RESPIRATORY_TRACT | 1 refills | Status: DC | PRN
  Filled 2023-01-22: qty 6.7, 16d supply, fill #0

## 2023-01-22 MED ORDER — TRAZODONE HCL 50 MG PO TABS
50.0000 mg | ORAL_TABLET | Freq: Every day | ORAL | 0 refills | Status: DC
  Filled 2023-01-22 – 2023-02-22 (×2): qty 180, 90d supply, fill #0

## 2023-01-23 ENCOUNTER — Other Ambulatory Visit (HOSPITAL_COMMUNITY): Payer: Self-pay

## 2023-01-27 ENCOUNTER — Other Ambulatory Visit: Payer: Self-pay | Admitting: Primary Care

## 2023-01-29 ENCOUNTER — Other Ambulatory Visit: Payer: Self-pay | Admitting: Primary Care

## 2023-01-30 ENCOUNTER — Other Ambulatory Visit: Payer: Self-pay

## 2023-01-30 ENCOUNTER — Other Ambulatory Visit (HOSPITAL_COMMUNITY): Payer: Self-pay

## 2023-01-31 ENCOUNTER — Other Ambulatory Visit (HOSPITAL_COMMUNITY): Payer: Self-pay

## 2023-02-01 ENCOUNTER — Other Ambulatory Visit (HOSPITAL_COMMUNITY): Payer: Self-pay

## 2023-02-01 MED ORDER — AMOXICILLIN-POT CLAVULANATE 875-125 MG PO TABS
ORAL_TABLET | ORAL | 0 refills | Status: DC
  Filled 2023-02-01: qty 20, 10d supply, fill #0

## 2023-02-01 MED ORDER — PREDNISONE 10 MG (21) PO TBPK
ORAL_TABLET | ORAL | 0 refills | Status: AC
  Filled 2023-02-01: qty 21, 6d supply, fill #0

## 2023-02-01 MED ORDER — ALBUTEROL SULFATE HFA 108 (90 BASE) MCG/ACT IN AERS
2.0000 | INHALATION_SPRAY | RESPIRATORY_TRACT | 1 refills | Status: DC | PRN
  Filled 2023-02-01: qty 6.7, 16d supply, fill #0
  Filled 2023-02-01: qty 6.7, 17d supply, fill #0
  Filled 2023-02-02: qty 6.7, 16d supply, fill #0
  Filled 2023-02-22: qty 6.7, 16d supply, fill #1

## 2023-02-01 MED ORDER — HYDROCOD POLI-CHLORPHE POLI ER 10-8 MG/5ML PO SUER
ORAL | 0 refills | Status: DC
  Filled 2023-02-01: qty 70, 7d supply, fill #0

## 2023-02-01 MED ORDER — ALBUTEROL SULFATE (5 MG/ML) 0.5% IN NEBU
INHALATION_SOLUTION | RESPIRATORY_TRACT | 0 refills | Status: AC
  Filled 2023-02-01: qty 60, 7d supply, fill #0

## 2023-02-01 MED ORDER — IPRATROPIUM BROMIDE 0.06 % NA SOLN
2.0000 | Freq: Three times a day (TID) | NASAL | 2 refills | Status: DC
  Filled 2023-02-01: qty 15, 14d supply, fill #0
  Filled 2023-02-22: qty 15, 14d supply, fill #1
  Filled 2023-04-20: qty 15, 14d supply, fill #2

## 2023-02-01 MED ORDER — OFLOXACIN 0.3 % OT SOLN
OTIC | 0 refills | Status: DC
  Filled 2023-02-01: qty 5, 10d supply, fill #0
  Filled 2023-02-22: qty 5, 10d supply, fill #1

## 2023-02-02 ENCOUNTER — Other Ambulatory Visit (HOSPITAL_COMMUNITY): Payer: Self-pay

## 2023-02-03 ENCOUNTER — Other Ambulatory Visit (HOSPITAL_COMMUNITY): Payer: Self-pay

## 2023-02-08 ENCOUNTER — Other Ambulatory Visit (HOSPITAL_COMMUNITY): Payer: Self-pay

## 2023-02-08 MED ORDER — NALOXONE HCL 4 MG/0.1ML NA LIQD
NASAL | 0 refills | Status: DC
  Filled 2023-02-08: qty 2, 14d supply, fill #0
  Filled 2023-02-22: qty 2, 30d supply, fill #0

## 2023-02-08 MED ORDER — HYDROCOD POLI-CHLORPHE POLI ER 10-8 MG/5ML PO SUER
ORAL | 0 refills | Status: DC
  Filled 2023-02-08: qty 70, 7d supply, fill #0

## 2023-02-09 NOTE — Progress Notes (Unsigned)
[name] [address] [place] [address] Phone: [phone] Subjective:   Frank [name], am serving as a scribe for Dr. Antoine [name].  I'm seeing this patient by the request  of:  Frank Oar, PA  CC: Back pain, neck pain and multiple joint complaints,  NUU:[id number]  Frank [name] is a 32 y.o. male coming in with complaint of back and neck pain. OMT 01/18/2023. L ankle and R wrist pain. Patient states that he still has pain in the thoracic spine more than usual.   Able to manage pain in neck and hips. Pain intermittently.   Cramping still occurring with driving and days when he is more active.   Medications patient has been prescribed: Cymbalta  Taking: Yes      Reviewing patient's chart has seen neurology recently for the idiopathic intracranial hypertension.  Patient was to continue to take Lasix.  Recently also had an upper respiratory infection    Reviewed prior external information including notes and imaging from previsou exam, outside providers and external EMR if available.   As well as notes that were available from care everywhere and other healthcare systems.  Past medical history, social, surgical and family history all reviewed in electronic medical record.  No pertanent information unless stated regarding to the chief complaint.   Past Medical History:  Diagnosis Date   ADD (attention deficit disorder)    Anxiety    Narcolepsy through school    Allergies  Allergen Reactions   Vueway [Gadopiclenol] Nausea Only     Review of Systems:  No  visual changes, nausea, vomiting, diarrhea, constipation, dizziness, abdominal pain, skin rash, fevers, chills, night sweats, weight loss, swollen lymph nodes,  joint swelling, chest pain, shortness of breath, mood changes. POSITIVE muscle aches, body aches, headaches  Objective  Blood pressure 122/82, pulse 82, height 6' (1.829 m), weight (!) 306 lb (138.8 kg), SpO2  98%.   General: No apparent distress alert and oriented x3 mood and affect normal, dressed appropriately.  HEENT: Pupils equal, extraocular movements intact  Respiratory: Patient's speak in full sentences and does not appear short of breath  Cardiovascular: No lower extremity edema, non tender, no erythema  Gait MSK:  Back significant tightness noted in the thoracolumbar junction on the right side more than left.  Patient though does also have abdominal pain on the right side.  Tightness of the right neck at the occipital region.  Osteopathic findings  C2 flexed rotated and side bent right C6 flexed rotated and side bent right T3 extended rotated and side bent right inhaled rib T9 extended rotated and side bent right L2 flexed rotated and side bent right Sacrum right on right     Assessment and Plan:  Degeneration of intervertebral disc of thoracic region Known arthritic change but scared that patient is having more of a viscerosomatic.  Toradol and Depo-Medrol injections given today and tolerated the procedure well.  Discussed icing regimen and home exercises.  Increase activity slowly otherwise.  Discussed avoiding certain activities.  Follow-up with me again in 6 to 8 weeks otherwise  Abdominal pain, chronic, right upper quadrant Unfortunately worsening right upper quadrant pain.  Does have some association with eating.  Concerned that there could be some gallbladder pathology.  Will get HIDA scan to further evaluate.  Depending on findings we will discuss with primary care as well as other physicians if necessary.  New onset headache Patient does have intracranial hypertension.  Seeing another  provider for this.  Discussed icing regimen and home exercises, which activities to do and which ones to avoid.  Increase activity slowly.  Follow-up again in 6 to 8 weeks    Nonallopathic problems  Decision today to treat with OMT was based on Physical Exam  After verbal consent  patient was treated with HVLA, ME, FPR techniques in cervical, rib, thoracic, lumbar, and sacral  areas  Patient tolerated the procedure well with improvement in symptoms  Patient given exercises, stretches and lifestyle modifications  See medications in patient instructions if given  Patient will follow up in 4-8 weeks     The above documentation has been reviewed and is accurate and complete Frank Saa, DO         Note: This dictation was prepared with Dragon dictation along with smaller phrase technology. Any transcriptional errors that result from this process are unintentional.

## 2023-02-12 ENCOUNTER — Ambulatory Visit (INDEPENDENT_AMBULATORY_CARE_PROVIDER_SITE_OTHER): Payer: BC Managed Care – PPO | Admitting: Family Medicine

## 2023-02-12 ENCOUNTER — Encounter: Payer: Self-pay | Admitting: Family Medicine

## 2023-02-12 ENCOUNTER — Other Ambulatory Visit (HOSPITAL_COMMUNITY): Payer: Self-pay

## 2023-02-12 VITALS — BP 122/82 | HR 82 | Ht 72.0 in | Wt 306.0 lb

## 2023-02-12 DIAGNOSIS — G8929 Other chronic pain: Secondary | ICD-10-CM

## 2023-02-12 DIAGNOSIS — R1011 Right upper quadrant pain: Secondary | ICD-10-CM | POA: Diagnosis not present

## 2023-02-12 DIAGNOSIS — M9904 Segmental and somatic dysfunction of sacral region: Secondary | ICD-10-CM

## 2023-02-12 DIAGNOSIS — R519 Headache, unspecified: Secondary | ICD-10-CM

## 2023-02-12 DIAGNOSIS — M9908 Segmental and somatic dysfunction of rib cage: Secondary | ICD-10-CM

## 2023-02-12 DIAGNOSIS — M5134 Other intervertebral disc degeneration, thoracic region: Secondary | ICD-10-CM

## 2023-02-12 DIAGNOSIS — M9901 Segmental and somatic dysfunction of cervical region: Secondary | ICD-10-CM

## 2023-02-12 DIAGNOSIS — M9903 Segmental and somatic dysfunction of lumbar region: Secondary | ICD-10-CM | POA: Diagnosis not present

## 2023-02-12 DIAGNOSIS — M9902 Segmental and somatic dysfunction of thoracic region: Secondary | ICD-10-CM

## 2023-02-12 MED ORDER — CIPROFLOXACIN-DEXAMETHASONE 0.3-0.1 % OT SUSP
4.0000 [drp] | Freq: Two times a day (BID) | OTIC | 0 refills | Status: DC
  Filled 2023-02-12: qty 7.5, 10d supply, fill #0

## 2023-02-12 MED ORDER — KETOROLAC TROMETHAMINE 60 MG/2ML IM SOLN
60.0000 mg | Freq: Once | INTRAMUSCULAR | Status: AC
  Administered 2023-02-12: 60 mg via INTRAMUSCULAR

## 2023-02-12 MED ORDER — METHYLPREDNISOLONE ACETATE 80 MG/ML IJ SUSP
80.0000 mg | Freq: Once | INTRAMUSCULAR | Status: AC
  Administered 2023-02-12: 80 mg via INTRAMUSCULAR

## 2023-02-12 NOTE — Assessment & Plan Note (Signed)
Patient does have intracranial hypertension.  Seeing another provider for this.  Discussed icing regimen and home exercises, which activities to do and which ones to avoid.  Increase activity slowly.  Follow-up again in 6 to 8 weeks

## 2023-02-12 NOTE — Patient Instructions (Signed)
HIDA scan Injections in backside today See me again in 5 weeks

## 2023-02-12 NOTE — Assessment & Plan Note (Signed)
Known arthritic change but scared that patient is having more of a viscerosomatic.  Toradol and Depo-Medrol injections given today and tolerated the procedure well.  Discussed icing regimen and home exercises.  Increase activity slowly otherwise.  Discussed avoiding certain activities.  Follow-up with me again in 6 to 8 weeks otherwise

## 2023-02-12 NOTE — Assessment & Plan Note (Signed)
Unfortunately worsening right upper quadrant pain.  Does have some association with eating.  Concerned that there could be some gallbladder pathology.  Will get HIDA scan to further evaluate.  Depending on findings we will discuss with primary care as well as other physicians if necessary.

## 2023-02-22 ENCOUNTER — Other Ambulatory Visit (HOSPITAL_COMMUNITY): Payer: Self-pay

## 2023-02-22 ENCOUNTER — Other Ambulatory Visit: Payer: Self-pay | Admitting: Family Medicine

## 2023-02-23 ENCOUNTER — Other Ambulatory Visit (HOSPITAL_COMMUNITY): Payer: Self-pay

## 2023-02-23 ENCOUNTER — Other Ambulatory Visit: Payer: Self-pay

## 2023-02-23 MED ORDER — OXYCODONE HCL 10 MG PO TABS
10.0000 mg | ORAL_TABLET | Freq: Three times a day (TID) | ORAL | 0 refills | Status: DC | PRN
  Filled 2023-02-23 – 2023-03-02 (×2): qty 90, 30d supply, fill #0

## 2023-02-23 MED ORDER — DULOXETINE HCL 20 MG PO CPEP
40.0000 mg | ORAL_CAPSULE | Freq: Every day | ORAL | 0 refills | Status: DC
  Filled 2023-02-23 (×2): qty 60, 30d supply, fill #0

## 2023-02-23 MED ORDER — BENZONATATE 200 MG PO CAPS
200.0000 mg | ORAL_CAPSULE | Freq: Three times a day (TID) | ORAL | 0 refills | Status: DC | PRN
  Filled 2023-02-23: qty 20, 7d supply, fill #0

## 2023-02-23 MED ORDER — METHOCARBAMOL 500 MG PO TABS
500.0000 mg | ORAL_TABLET | Freq: Four times a day (QID) | ORAL | 1 refills | Status: AC | PRN
  Filled 2023-02-23: qty 120, 30d supply, fill #0
  Filled 2023-04-20: qty 120, 30d supply, fill #1

## 2023-02-23 MED ORDER — HYDROCOD POLI-CHLORPHE POLI ER 10-8 MG/5ML PO SUER
ORAL | 0 refills | Status: DC
  Filled 2023-02-23: qty 70, 7d supply, fill #0

## 2023-02-23 MED ORDER — AMPHETAMINE-DEXTROAMPHETAMINE 30 MG PO TABS
ORAL_TABLET | ORAL | 0 refills | Status: DC
  Filled 2023-02-23: qty 75, 30d supply, fill #0

## 2023-02-24 ENCOUNTER — Other Ambulatory Visit (HOSPITAL_COMMUNITY): Payer: Self-pay

## 2023-02-26 ENCOUNTER — Other Ambulatory Visit (HOSPITAL_COMMUNITY): Payer: Self-pay

## 2023-03-02 ENCOUNTER — Other Ambulatory Visit: Payer: Self-pay

## 2023-03-02 ENCOUNTER — Other Ambulatory Visit (HOSPITAL_COMMUNITY): Payer: Self-pay

## 2023-03-07 NOTE — Progress Notes (Unsigned)
[name] [address] [place] [address] Phone: [phone] Subjective:   [name], am serving as a scribe for Dr. Antoine Primas.  I'm seeing this patient by the request  of:  Porfirio Oar, PA  CC: Back and neck pain follow-up  ONG:[id number]  [name] is a 33 y.o. male coming in with complaint of back and neck pain. OMT 02/12/2023. Patient states spasms are getting bad again. Same per usual. No new concerns.  Medications patient has been prescribed: Cymbalta  Taking: Yes         Reviewed prior external information including notes and imaging from previsou exam, outside providers and external EMR if available.   As well as notes that were available from care everywhere and other healthcare systems.  Past medical history, social, surgical and family history all reviewed in electronic medical record.  No pertanent information unless stated regarding to the chief complaint.   Past Medical History:  Diagnosis Date   ADD (attention deficit disorder)    Anxiety    Narcolepsy through school    Allergies  Allergen Reactions   Vueway [Gadopiclenol] Nausea Only     Review of Systems:  No headache, visual changes, nausea, vomiting, diarrhea, constipation, dizziness, abdominal pain, skin rash, fevers, chills, night sweats, weight loss, swollen lymph nodes, body aches, joint swelling, chest pain, shortness of breath, mood changes. POSITIVE muscle aches  Objective  Blood pressure 120/84, pulse 84, height 6' (1.829 m), weight (!) 306 lb (138.8 kg), SpO2 98%.   General: No apparent distress alert and oriented x3 mood and affect normal, dressed appropriately.  HEENT: Pupils equal, extraocular movements intact  Respiratory: Patient's speak in full sentences and does not appear short of breath  Cardiovascular: No lower extremity edema, non tender, no erythema  Gait MSK:  Back does have loss lordosis.  Abdominal pain is noted  on the right upper quadrant.  Fairly severe and does have some guarding but does appear to be voluntary.  Osteopathic findings  C2 flexed rotated and side bent right C6 flexed rotated and side bent left T3 extended rotated and side bent right inhaled rib T9 extended rotated and side bent right T11 flexed rotated and side bent right L2 flexed rotated and side bent right Sacrum right on right    Assessment and Plan:  Abdominal pain, chronic, right upper quadrant Chronic pain that is worsening, awaiting HIDA scan, pain is worsening and low-grade CT abdomen and done to further evaluate for any other GI issues or findings that could be consistent and causing more of a viscerosomatic pain.  Toradol and Depo-Medrol given for the pain relief at the moment.  Attempted some mild osteoarthritic.  Follow-up again in 6 to 8 weeks  Degeneration of intervertebral disc of thoracic region Chronic problem, discussed icing regimen and home exercises, which activities to do and which ones to avoid.  Follow-up again in 6 to 8 weeks    Nonallopathic problems  Decision today to treat with OMT was based on Physical Exam  After verbal consent patient was treated with HVLA, ME, FPR techniques in cervical, rib, thoracic, lumbar, and sacral  areas  Patient tolerated the procedure well with improvement in symptoms  Patient given exercises, stretches and lifestyle modifications  See medications in patient instructions if given  Patient will follow up in 4-8 weeks     The above documentation has been reviewed and is accurate and complete [name], DO  Note: This dictation was prepared with Dragon dictation along with smaller phrase technology. Any transcriptional errors that result from this process are unintentional.

## 2023-03-08 ENCOUNTER — Ambulatory Visit: Payer: BC Managed Care – PPO | Admitting: Family Medicine

## 2023-03-08 ENCOUNTER — Encounter: Payer: Self-pay | Admitting: Family Medicine

## 2023-03-08 VITALS — BP 120/84 | HR 84 | Ht 72.0 in | Wt 306.0 lb

## 2023-03-08 DIAGNOSIS — M5134 Other intervertebral disc degeneration, thoracic region: Secondary | ICD-10-CM | POA: Diagnosis not present

## 2023-03-08 DIAGNOSIS — R1011 Right upper quadrant pain: Secondary | ICD-10-CM | POA: Diagnosis not present

## 2023-03-08 DIAGNOSIS — M9903 Segmental and somatic dysfunction of lumbar region: Secondary | ICD-10-CM

## 2023-03-08 DIAGNOSIS — M9904 Segmental and somatic dysfunction of sacral region: Secondary | ICD-10-CM

## 2023-03-08 DIAGNOSIS — G8929 Other chronic pain: Secondary | ICD-10-CM

## 2023-03-08 DIAGNOSIS — M9902 Segmental and somatic dysfunction of thoracic region: Secondary | ICD-10-CM | POA: Diagnosis not present

## 2023-03-08 DIAGNOSIS — M9908 Segmental and somatic dysfunction of rib cage: Secondary | ICD-10-CM

## 2023-03-08 DIAGNOSIS — M9901 Segmental and somatic dysfunction of cervical region: Secondary | ICD-10-CM | POA: Diagnosis not present

## 2023-03-08 DIAGNOSIS — K760 Fatty (change of) liver, not elsewhere classified: Secondary | ICD-10-CM

## 2023-03-08 MED ORDER — KETOROLAC TROMETHAMINE 60 MG/2ML IM SOLN
60.0000 mg | Freq: Once | INTRAMUSCULAR | Status: AC
  Administered 2023-03-08: 60 mg via INTRAMUSCULAR

## 2023-03-08 MED ORDER — METHYLPREDNISOLONE ACETATE 80 MG/ML IJ SUSP
80.0000 mg | Freq: Once | INTRAMUSCULAR | Status: AC
  Administered 2023-03-08: 80 mg via INTRAMUSCULAR

## 2023-03-08 NOTE — Assessment & Plan Note (Signed)
Chronic pain that is worsening, awaiting HIDA scan, pain is worsening and low-grade CT abdomen and done to further evaluate for any other GI issues or findings that could be consistent and causing more of a viscerosomatic pain.  Toradol and Depo-Medrol given for the pain relief at the moment.  Attempted some mild osteoarthritic.  Follow-up again in 6 to 8 weeks

## 2023-03-08 NOTE — Patient Instructions (Addendum)
Cocktail injection [facility] [phone]

## 2023-03-08 NOTE — Assessment & Plan Note (Signed)
Chronic problem, discussed icing regimen and home exercises, which activities to do and which ones to avoid.  Follow-up again in 6 to 8 weeks

## 2023-03-15 ENCOUNTER — Ambulatory Visit
Admission: RE | Admit: 2023-03-15 | Discharge: 2023-03-15 | Discharge status: Home / Self Care | Payer: BC Managed Care – PPO | Source: Ambulatory Visit | Attending: Family Medicine | Admitting: Family Medicine

## 2023-03-15 DIAGNOSIS — K760 Fatty (change of) liver, not elsewhere classified: Secondary | ICD-10-CM

## 2023-03-15 MED ORDER — IOPAMIDOL (ISOVUE-300) INJECTION 61%
100.0000 mL | Freq: Once | INTRAVENOUS | Status: AC | PRN
  Administered 2023-03-15: 100 mL via INTRAVENOUS

## 2023-03-20 ENCOUNTER — Telehealth: Payer: Self-pay | Admitting: Family Medicine

## 2023-03-20 NOTE — Telephone Encounter (Signed)
Pt wants to know if Dr. Katrinka Blazing could get the CT ABD results from 03/15/23 expedited. Pt is having a procedure on 2/6 that he might not need based on the CT results.Marland KitchenMarland Kitchen

## 2023-03-21 ENCOUNTER — Encounter: Payer: Self-pay | Admitting: Family Medicine

## 2023-03-22 ENCOUNTER — Other Ambulatory Visit (HOSPITAL_COMMUNITY): Payer: Self-pay

## 2023-03-22 ENCOUNTER — Encounter (HOSPITAL_COMMUNITY)
Admission: RE | Admit: 2023-03-22 | Discharge: 2023-03-22 | Disposition: A | Discharge status: Home / Self Care | Payer: BC Managed Care – PPO | Source: Ambulatory Visit | Attending: Family Medicine | Admitting: Family Medicine

## 2023-03-22 ENCOUNTER — Encounter: Payer: Self-pay | Admitting: Family Medicine

## 2023-03-22 DIAGNOSIS — R1011 Right upper quadrant pain: Secondary | ICD-10-CM | POA: Insufficient documentation

## 2023-03-22 MED ORDER — OXYCODONE HCL 10 MG PO TABS
10.0000 mg | ORAL_TABLET | Freq: Three times a day (TID) | ORAL | 0 refills | Status: DC
  Filled 2023-03-22 – 2023-03-31 (×2): qty 90, 30d supply, fill #0

## 2023-03-22 MED ORDER — TECHNETIUM TO 99M ALBUMIN AGGREGATED: 5.5000 | Freq: Once | INTRAVENOUS | Status: DC | PRN

## 2023-03-22 MED ORDER — TECHNETIUM TC 99M MEBROFENIN IV KIT
5.5000 | PACK | Freq: Once | INTRAVENOUS | Status: AC
  Administered 2023-03-22: 5.5 via INTRAVENOUS

## 2023-03-22 MED ORDER — AMPHETAMINE-DEXTROAMPHETAMINE 30 MG PO TABS
30.0000 mg | ORAL_TABLET | Freq: Two times a day (BID) | ORAL | 0 refills | Status: DC
  Filled 2023-03-22 – 2023-03-24 (×2): qty 75, 30d supply, fill #0

## 2023-03-23 ENCOUNTER — Other Ambulatory Visit (HOSPITAL_COMMUNITY): Payer: Self-pay

## 2023-03-23 MED ORDER — CLOMIPHENE CITRATE 50 MG PO TABS
ORAL_TABLET | ORAL | 2 refills | Status: DC
  Filled 2023-03-23: qty 20, 80d supply, fill #0

## 2023-03-23 MED ORDER — ALBUTEROL SULFATE 2.5 MG/0.5ML IN NEBU
5.0000 mg | INHALATION_SOLUTION | Freq: Four times a day (QID) | RESPIRATORY_TRACT | 0 refills | Status: DC | PRN
  Filled 2023-03-23: qty 60, 15d supply, fill #0

## 2023-03-24 ENCOUNTER — Other Ambulatory Visit (HOSPITAL_COMMUNITY): Payer: Self-pay

## 2023-03-26 ENCOUNTER — Other Ambulatory Visit (HOSPITAL_COMMUNITY): Payer: Self-pay

## 2023-03-29 ENCOUNTER — Other Ambulatory Visit (HOSPITAL_COMMUNITY): Payer: Self-pay

## 2023-03-29 MED ORDER — FLUTICASONE-SALMETEROL 45-21 MCG/ACT IN AERO
2.0000 | INHALATION_SPRAY | Freq: Two times a day (BID) | RESPIRATORY_TRACT | 3 refills | Status: DC
  Filled 2023-03-29: qty 12, 23d supply, fill #0
  Filled 2023-03-30: qty 12, 30d supply, fill #0
  Filled 2023-04-02: qty 12, 28d supply, fill #0
  Filled 2023-04-17 (×2): qty 12, 30d supply, fill #0

## 2023-03-29 NOTE — Telephone Encounter (Signed)
Patient states that they have not heard about the ONO yet. Please help.

## 2023-03-29 NOTE — Progress Notes (Signed)
[name] [address] [place] [address] Phone: [phone] Subjective:   [name], am serving as a scribe for Dr. Antoine Primas.  I'm seeing this patient by the request  of:  Porfirio Oar, PA  CC: Back pain, abdominal pain  BJY:[id number]  SCHON ZEIDERS is a 33 y.o. male coming in with complaint of back and neck pain. OMT 03/08/2023. Patient states normal pains. Next steps since gall bladder wasn't the issue.  Medications patient has been prescribed: Cymbalta  Taking:  Since we have seen patient patient did have a HIDA scan that was negative.  CT abdomen and pelvis was done as well that did show some mild renal stones but no significant impact noted.  Schuermann's disease of the thoracic spine noted.   Patient's previous MRI of the thoracic spine in 2023 did show the wedge deformities at T9 and T10.    Reviewed prior external information including notes and imaging from previsou exam, outside providers and external EMR if available.   As well as notes that were available from care everywhere and other healthcare systems.  Past medical history, social, surgical and family history all reviewed in electronic medical record.  No pertanent information unless stated regarding to the chief complaint.   Past Medical History:  Diagnosis Date   ADD (attention deficit disorder)    Anxiety    Narcolepsy through school    Allergies  Allergen Reactions   Vueway [Gadopiclenol] Nausea Only     Review of Systems:  No headache, visual changes, nausea, vomiting, diarrhea, constipation, dizziness, skin rash, fevers, chills, night sweats, weight loss, swollen lymph nodes, body aches, joint swelling, chest pain, shortness of breath, mood changes. POSITIVE muscle aches, abdominal pain  Objective  Blood pressure (!) 136/92, pulse 72, height 6' (1.829 m), weight 295 lb (133.8 kg), SpO2 98%.   General: No apparent distress alert and oriented  x3 mood and affect normal, dressed appropriately.  HEENT: Pupils equal, extraocular movements intact  Respiratory: Patient's speak in full sentences and does not appear short of breath  Cardiovascular: No lower extremity edema, non tender, no erythema  MSK:  Back loss of lordosis tightness noted in TL and the lumbar spine right and left  Abdominal exam still shows some pain in the right upper quadrant noted.  Mild in the right lower quadrant today.  More pain now in the paraspinal musculature especially the lumbar and thoracic area.  Osteopathic findings  C3 flexed rotated and side bent right C5 flexed rotated and side bent left T3 extended rotated and side bent right inhaled rib T8 extended rotated and side bent right T10 extended rotated and side bent right L1 flexed rotated and side bent right Sacrum right on right       Assessment and Plan:  Abdominal pain, chronic, right lower quadrant Patient does have a chronic abdominal pain in the right upper and the right lower quadrant.  We discussed with patient that I do think that there is a possibility that patient's Schuermann's disease could be potentially contributing.  We discussed potentially trying an epidural at the T9-T10 level to do diagnostic and therapeutic purposes.  Depending on how patient responds then we can consider either a cutaneous nerve block or the possibility of referral to gastroenterology to see if anything such as an MRCP would be beneficial for this individual who continues to have this chronic, discomfort that seems to radiate around.  Patient has failed multiple other treatment  options over the course of time including medications, exercises including physical therapy.  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  Will try a injection in the T9-T10 area.  Hopeful that this will make some improvement.  Patient is working with other providers to see if he would be a candidate for  potentially a spinal stimulator as well.  Will continue to monitor.  Still gallbladder disease is within some of the differential but does not seem to show on ultrasound for on HIDA scan at the moment.    Nonallopathic problems  Decision today to treat with OMT was based on Physical Exam  After verbal consent patient was treated with HVLA, ME, FPR techniques in cervical, rib, thoracic, lumbar, and sacral  areas  Patient tolerated the procedure well with improvement in symptoms  Patient given exercises, stretches and lifestyle modifications  See medications in patient instructions if given  Patient will follow up in 4-8 weeks    The above documentation has been reviewed and is accurate and complete [name], DO          Note: This dictation was prepared with Dragon dictation along with smaller phrase technology. Any transcriptional errors that result from this process are unintentional.

## 2023-03-29 NOTE — Telephone Encounter (Signed)
I have sent urgent message to [employer] asking about this issue since it has been almost a year order placed 05/12/22 a new ONO order may have to be placed

## 2023-03-30 ENCOUNTER — Other Ambulatory Visit (HOSPITAL_COMMUNITY): Payer: Self-pay

## 2023-03-30 NOTE — Telephone Encounter (Signed)
Elease Hashimoto with [employer] stated they would reach out to schedule with the patient ASAP

## 2023-03-31 ENCOUNTER — Other Ambulatory Visit: Payer: Self-pay

## 2023-03-31 ENCOUNTER — Other Ambulatory Visit (HOSPITAL_COMMUNITY): Payer: Self-pay

## 2023-04-02 ENCOUNTER — Other Ambulatory Visit (HOSPITAL_COMMUNITY): Payer: Self-pay

## 2023-04-03 ENCOUNTER — Ambulatory Visit: Payer: BC Managed Care – PPO | Admitting: Family Medicine

## 2023-04-03 ENCOUNTER — Encounter: Payer: Self-pay | Admitting: Family Medicine

## 2023-04-03 VITALS — BP 136/92 | HR 72 | Ht 72.0 in | Wt 295.0 lb

## 2023-04-03 DIAGNOSIS — M9904 Segmental and somatic dysfunction of sacral region: Secondary | ICD-10-CM

## 2023-04-03 DIAGNOSIS — R1031 Right lower quadrant pain: Secondary | ICD-10-CM | POA: Diagnosis not present

## 2023-04-03 DIAGNOSIS — M9903 Segmental and somatic dysfunction of lumbar region: Secondary | ICD-10-CM | POA: Diagnosis not present

## 2023-04-03 DIAGNOSIS — M5134 Other intervertebral disc degeneration, thoracic region: Secondary | ICD-10-CM

## 2023-04-03 DIAGNOSIS — M9908 Segmental and somatic dysfunction of rib cage: Secondary | ICD-10-CM

## 2023-04-03 DIAGNOSIS — M9901 Segmental and somatic dysfunction of cervical region: Secondary | ICD-10-CM | POA: Diagnosis not present

## 2023-04-03 DIAGNOSIS — G8929 Other chronic pain: Secondary | ICD-10-CM | POA: Insufficient documentation

## 2023-04-03 DIAGNOSIS — M9902 Segmental and somatic dysfunction of thoracic region: Secondary | ICD-10-CM

## 2023-04-03 NOTE — Assessment & Plan Note (Signed)
Patient does have a chronic abdominal pain in the right upper and the right lower quadrant.  We discussed with patient that I do think that there is a possibility that patient's Schuermann's disease could be potentially contributing.  We discussed potentially trying an epidural at the T9-T10 level to do diagnostic and therapeutic purposes.  Depending on how patient responds then we can consider either a cutaneous nerve block or the possibility of referral to gastroenterology to see if anything such as an MRCP would be beneficial for this individual who continues to have this chronic, discomfort that seems to radiate around.  Patient has failed multiple other treatment options over the course of time including medications, exercises including physical therapy.

## 2023-04-03 NOTE — Assessment & Plan Note (Signed)
Known Shuman disease.  Will try a injection in the T9-T10 area.  Hopeful that this will make some improvement.  Patient is working with other providers to see if he would be a candidate for potentially a spinal stimulator as well.  Will continue to monitor.  Still gallbladder disease is within some of the differential but does not seem to show on ultrasound for on HIDA scan at the moment.

## 2023-04-03 NOTE — Patient Instructions (Addendum)
[facility] [phone]  See you again in 5 weeks after epidural Cutaneous nerve block if not better

## 2023-04-13 NOTE — Discharge Instructions (Signed)

## 2023-04-16 ENCOUNTER — Ambulatory Visit
Admission: RE | Admit: 2023-04-16 | Discharge: 2023-04-16 | Disposition: A | Discharge status: Home / Self Care | Payer: BC Managed Care – PPO | Source: Ambulatory Visit | Attending: Family Medicine | Admitting: Family Medicine

## 2023-04-16 DIAGNOSIS — M5134 Other intervertebral disc degeneration, thoracic region: Secondary | ICD-10-CM

## 2023-04-16 MED ORDER — IOPAMIDOL (ISOVUE-M 300) INJECTION 61%
1.0000 mL | Freq: Once | INTRAMUSCULAR | Status: AC
  Administered 2023-04-16: 1 mL via EPIDURAL

## 2023-04-16 MED ORDER — TRIAMCINOLONE ACETONIDE 40 MG/ML IJ SUSP (RADIOLOGY)
60.0000 mg | Freq: Once | INTRAMUSCULAR | Status: AC
  Administered 2023-04-16: 60 mg via EPIDURAL

## 2023-04-17 ENCOUNTER — Other Ambulatory Visit (HOSPITAL_COMMUNITY): Payer: Self-pay

## 2023-04-17 ENCOUNTER — Other Ambulatory Visit: Payer: Self-pay

## 2023-04-20 ENCOUNTER — Other Ambulatory Visit (HOSPITAL_COMMUNITY): Payer: Self-pay

## 2023-04-20 ENCOUNTER — Other Ambulatory Visit: Payer: Self-pay

## 2023-04-20 ENCOUNTER — Other Ambulatory Visit: Payer: Self-pay | Admitting: Family Medicine

## 2023-04-20 MED ORDER — ALBUTEROL SULFATE HFA 108 (90 BASE) MCG/ACT IN AERS
2.0000 | INHALATION_SPRAY | RESPIRATORY_TRACT | 1 refills | Status: DC | PRN
Start: 2023-04-20 — End: 2023-04-24
  Filled 2023-04-20: qty 6.7, 25d supply, fill #0

## 2023-04-20 MED ORDER — ALBUTEROL SULFATE (2.5 MG/3ML) 0.083% IN NEBU
INHALATION_SOLUTION | Freq: Four times a day (QID) | RESPIRATORY_TRACT | 0 refills | Status: AC
  Filled 2023-04-20: qty 180, 8d supply, fill #0

## 2023-04-20 MED ORDER — OXYCODONE HCL 10 MG PO TABS
10.0000 mg | ORAL_TABLET | Freq: Three times a day (TID) | ORAL | 0 refills | Status: DC | PRN
  Filled 2023-04-27: qty 90, 30d supply, fill #0

## 2023-04-20 MED ORDER — TRAZODONE HCL 50 MG PO TABS
ORAL_TABLET | Freq: Every day | ORAL | 0 refills | Status: AC
  Filled 2023-04-20: qty 180, 90d supply, fill #0
  Filled 2023-08-06: qty 180, fill #0
  Filled 2023-09-03: qty 180, 90d supply, fill #0

## 2023-04-20 MED ORDER — AMPHETAMINE-DEXTROAMPHETAMINE 30 MG PO TABS
ORAL_TABLET | ORAL | 0 refills | Status: DC
  Filled 2023-04-23: qty 75, 30d supply, fill #0

## 2023-04-20 MED ORDER — BENZONATATE 200 MG PO CAPS
200.0000 mg | ORAL_CAPSULE | Freq: Three times a day (TID) | ORAL | 0 refills | Status: DC
  Filled 2023-04-20: qty 20, 7d supply, fill #0

## 2023-04-20 NOTE — Progress Notes (Signed)
 [name] [address] [place] [address] Phone: [phone] Subjective:   [name], am serving as a scribe for Dr. Antoine Primas.  I'm seeing this patient by the request  of:  Porfirio Oar, PA  CC: Low back pain and neck pain follow-up  BJY:[id number]  Frank [name] is a 33 y.o. male coming in with complaint of back and neck pain. OMT 04/03/2023.  Patient was having more thoracic pain and having difficulty.  Was given an epidural at T10 and T11 on March 3.  Patient states that epidural was helpful. Pain in back of L hip.   Medications patient has been prescribed: Cymbalta  Taking: Yes      Previous workup for abdominal pain include HIDA scan which was normal, CT abdomen with contrast that showed the wedge deformities of the lower thoracic vertebrae's and very small renal stones that are not obstructing.  Last laboratory workup included a c-Met showing some elevation in ALT at 55 but otherwise unremarkable GGT was elevated 3 months ago at 78.   Reviewed prior external information including notes and imaging from previsou exam, outside providers and external EMR if available.   As well as notes that were available from care everywhere and other healthcare systems.  Past medical history, social, surgical and family history all reviewed in electronic medical record.  No pertanent information unless stated regarding to the chief complaint.   Past Medical History:  Diagnosis Date   ADD (attention deficit disorder)    Anxiety    Narcolepsy through school    Allergies  Allergen Reactions   Vueway [Gadopiclenol] Nausea Only     Review of Systems:  No , visual changes, nausea, vomiting, diarrhea, constipation, dizziness,  skin rash, fevers, chills, night sweats, weight loss, swollen lymph nodes, body aches, joint swelling, chest pain, shortness of breath, mood changes. POSITIVE muscle aches, much improvement in abdominal pain,  minimal  Objective  Blood pressure 122/88, height 6' (1.829 m), weight (!) 302 lb (137 kg).   General: No apparent distress alert and oriented x3 mood and affect normal, dressed appropriately.  HEENT: Pupils equal, extraocular movements intact  Respiratory: Patient's speak in full sentences and does not appear short of breath  Cardiovascular: No lower extremity edema, non tender, no erythema  Gait MSK:  Back does have some loss lordosis noted.  Some tenderness to palpation in the paraspinal musculature.  Osteopathic findings  C2 flexed rotated and side bent right C6 flexed rotated and side bent left T3 extended rotated and side bent right inhaled rib T9 extended rotated and side bent left L2 flexed rotated and side bent right Sacrum right on right    Assessment and Plan:  Scheuermann's disease Improved after the thoracic T10 epidural.  Can consider the possibility of a repeat or nerve root ablation if necessary.  Hopeful that this will not be necessary.  Does respond relatively well to home exercises and icing regimen.  Discussed which activities to do and which ones to avoid.  Increase activity slowly otherwise.  Follow-up again in 6 to 8 weeks    Nonallopathic problems  Decision today to treat with OMT was based on Physical Exam  After verbal consent patient was treated with HVLA, ME, FPR techniques in cervical, rib, thoracic, lumbar, and sacral  areas  Patient tolerated the procedure well with improvement in symptoms  Patient given exercises, stretches and lifestyle modifications  See medications in patient instructions if given  Patient  will follow up in 4-8 weeks      The above documentation has been reviewed and is accurate and complete [name], DO        Note: This dictation was prepared with Dragon dictation along with smaller phrase technology. Any transcriptional errors that result from this process are unintentional.

## 2023-04-21 ENCOUNTER — Other Ambulatory Visit (HOSPITAL_COMMUNITY): Payer: Self-pay

## 2023-04-21 MED ORDER — DULOXETINE HCL 20 MG PO CPEP
40.0000 mg | ORAL_CAPSULE | Freq: Every day | ORAL | 0 refills | Status: DC
Start: 2023-04-21 — End: 2023-04-24
  Filled 2023-04-21: qty 60, 30d supply, fill #0

## 2023-04-23 ENCOUNTER — Other Ambulatory Visit (HOSPITAL_COMMUNITY): Payer: Self-pay

## 2023-04-24 ENCOUNTER — Ambulatory Visit: Payer: BC Managed Care – PPO | Admitting: Primary Care

## 2023-04-24 ENCOUNTER — Other Ambulatory Visit (HOSPITAL_COMMUNITY): Payer: Self-pay

## 2023-04-24 ENCOUNTER — Encounter: Payer: Self-pay | Admitting: Family Medicine

## 2023-04-24 ENCOUNTER — Encounter: Payer: Self-pay | Admitting: Primary Care

## 2023-04-24 ENCOUNTER — Ambulatory Visit: Payer: BC Managed Care – PPO | Admitting: Family Medicine

## 2023-04-24 VITALS — BP 122/88 | Ht 72.0 in | Wt 302.0 lb

## 2023-04-24 VITALS — BP 134/72 | HR 77 | Temp 97.1°F | Ht 72.0 in | Wt 301.0 lb

## 2023-04-24 DIAGNOSIS — M9908 Segmental and somatic dysfunction of rib cage: Secondary | ICD-10-CM

## 2023-04-24 DIAGNOSIS — M4200 Juvenile osteochondrosis of spine, site unspecified: Secondary | ICD-10-CM

## 2023-04-24 DIAGNOSIS — M2550 Pain in unspecified joint: Secondary | ICD-10-CM

## 2023-04-24 DIAGNOSIS — M9902 Segmental and somatic dysfunction of thoracic region: Secondary | ICD-10-CM

## 2023-04-24 DIAGNOSIS — M9903 Segmental and somatic dysfunction of lumbar region: Secondary | ICD-10-CM | POA: Diagnosis not present

## 2023-04-24 DIAGNOSIS — J4520 Mild intermittent asthma, uncomplicated: Secondary | ICD-10-CM

## 2023-04-24 DIAGNOSIS — G4733 Obstructive sleep apnea (adult) (pediatric): Secondary | ICD-10-CM

## 2023-04-24 DIAGNOSIS — J329 Chronic sinusitis, unspecified: Secondary | ICD-10-CM

## 2023-04-24 DIAGNOSIS — M9904 Segmental and somatic dysfunction of sacral region: Secondary | ICD-10-CM

## 2023-04-24 DIAGNOSIS — J40 Bronchitis, not specified as acute or chronic: Secondary | ICD-10-CM | POA: Diagnosis not present

## 2023-04-24 DIAGNOSIS — M9901 Segmental and somatic dysfunction of cervical region: Secondary | ICD-10-CM

## 2023-04-24 LAB — CBC WITH DIFFERENTIAL/PLATELET
Basophils Absolute: 0 10*3/uL (ref 0.0–0.1)
Basophils Relative: 0.6 % (ref 0.0–3.0)
Eosinophils Absolute: 0.2 10*3/uL (ref 0.0–0.7)
Eosinophils Relative: 2.2 % (ref 0.0–5.0)
HCT: 44.9 % (ref 39.0–52.0)
Hemoglobin: 15.4 g/dL (ref 13.0–17.0)
Lymphocytes Relative: 27.1 % (ref 12.0–46.0)
Lymphs Abs: 2 10*3/uL (ref 0.7–4.0)
MCHC: 34.4 g/dL (ref 30.0–36.0)
MCV: 94.3 fl (ref 78.0–100.0)
Monocytes Absolute: 0.7 10*3/uL (ref 0.1–1.0)
Monocytes Relative: 9.3 % (ref 3.0–12.0)
Neutro Abs: 4.6 10*3/uL (ref 1.4–7.7)
Neutrophils Relative %: 60.8 % (ref 43.0–77.0)
Platelets: 143 10*3/uL — ABNORMAL LOW (ref 150.0–400.0)
RBC: 4.76 Mil/uL (ref 4.22–5.81)
RDW: 14 % (ref 11.5–15.5)
WBC: 7.6 10*3/uL (ref 4.0–10.5)

## 2023-04-24 MED ORDER — FLUTICASONE-SALMETEROL 115-21 MCG/ACT IN AERO
2.0000 | INHALATION_SPRAY | Freq: Two times a day (BID) | RESPIRATORY_TRACT | 3 refills | Status: DC
  Filled 2023-04-24: qty 12, 30d supply, fill #0
  Filled 2023-05-22: qty 12, 30d supply, fill #1
  Filled 2023-06-29: qty 12, 30d supply, fill #2
  Filled 2023-08-06 – 2023-09-03 (×2): qty 12, 30d supply, fill #3

## 2023-04-24 MED ORDER — METHYLPREDNISOLONE ACETATE 40 MG/ML IJ SUSP
40.0000 mg | Freq: Once | INTRAMUSCULAR | Status: AC
  Administered 2023-04-24: 40 mg via INTRAMUSCULAR

## 2023-04-24 MED ORDER — KETOROLAC TROMETHAMINE 30 MG/ML IJ SOLN
30.0000 mg | Freq: Once | INTRAMUSCULAR | Status: AC
  Administered 2023-04-24: 30 mg via INTRAMUSCULAR

## 2023-04-24 NOTE — Progress Notes (Signed)
 @Patient  ID: [name], male    DOB: [birth year], 33 y.o.   MRN: [id number]  Chief Complaint  Patient presents with   Follow-up    Pos for flu x 3 weeks ago. Unable to get rid of chest congestion ( clear to yellow mucous)    Referring provider: Porfirio Oar, PA  HPI:  33 year old male, never smoked. PMH significant for ADD, OCD, moderate OSA.   Previous [facility] pulmonary encounter:  05/04/2021 Patient presents today for sleep consult. He had sleep study in 2016 that showed mild OSA, AHI was 5.4. He was never started on treatment. He has symptoms of loud snoring, witness apnea, restless sleep and daytime sleepiness. He has a family hx sleep apnea. Wife states that his oxygen will drop into the 60s. Typical bedtime is between 8-11pm. He takes 50mg  trazodone for insomnia at bedtime, he has prn Ambien which he uses very infrequently. He wakes up multiple times at night, previously thought this was due to his chronic back pain. He starts his day at 4:30 or 6am. No morning grogginess but reports daytime sleepiness in the afternoon. Drinking a lot of caffeine to stay awake. He takes adderall 15mg  as needed mostly when having to drive, he does not use every day. He has been having palpitations and dizziness. Denies narcolepsy, cataplexy or sleep walking.   Sleep questionnaire: Symptoms- Loud snoring, waking up gasping for air, daytime sleepiness  Prior sleep study- Yes, early 82s (results available) Bedtime- 8-11pm Time to fall asleep- minutes to hours  Nocturnal awakenings- too many times  Out of bed/start of day- 4:30 or 6am  Weight changes- Fluctuates  Do you operate heavy machinery- No Do you currently wear CPAP-No  Do you current wear oxygen- No  Epworth - 17  06/22/2021 Patient contacted today for follow-up. Accompanied by wife on virtual video visit. He had a home sleep test on 06/03/21 that showed moderate obstructive sleep apnea, AHI 23.4/hr with SpO2 low 70% (average 90%).  Reviewed treatment options. His daytime sleepiness has worsened, he would like to try CPAP. He is no longer taking Ambien to sleep, remains on trazodone 50mg  at bedtime. He is taking Adderall 15mg  TID as needed for ADD symptoms.  Moderate OSA: - Patient has symptoms of loud snoring, waking up gasping for air and daytime sleepiness. Home sleep study on 06/03/21 >> AHI 23.4/hr with SpO2 low 70%. Reviewed treatment options including weight loss, oral appliance, CPAP therapy or referral to ENT. Due to severe of symptoms and daytime sleepiness recommended starting CPAP therapy, patient is agreement with plan.  DME referral placed for new CPAP start auto titrate 5 to 15 cm H2O with mask of choice. Encourage patient aim to wear CPAP every night min 4-6 hours or longer. Advised against driving if experiencing excessive daytime sleepiness. FU in 8 weeks for compliance check.   Orders: Start CPAP 5-15cm h20 with mask of choice/chin strap    09/06/2021 Patient presents today for 8-week follow-up OSA.  Symptoms of loud snoring, waking up gasping for air and daytime sleepiness.  Home sleep study on 06/03/2021 showed moderate obstructive sleep apnea, AHI 23.4 an hour.  During last visit patient was started on CPAP.  Patient was started on CPAP and has been wearing consistently the last 30 days.  He reports significant benefit in sleep quality and energy level since using.  He is able to sleep through the night and is not as tired the next day.  He is getting good  rest.  He is having less chronic pain symptoms and has been able to decrease opioids.  He is feeling a lot better.  Not needing to drink as much caffeine.  Mind is clear.  He recently developed URI symptoms 2 weeks ago and has been having more difficulty wearing his CPAP due to nasal congestion.  He has an associated productive cough with yellow mucus and wheezing.  His 29-year-old has been sick on and off and plans on getting ear tubes with ENT in  August.  Airview download 08/07/2021 - 09/05/2021 Usage days 30/30 days (100%); 26 days (87%): 4 hours Average usage 6 hours 6 minutes Pressure 5 to 15 cm H2O (13.4 cm H2O-95%) Airleaks 1.4L/min (95%) AHI 1.2   02/10/2022 Patient contacted today virtually for for 4-6 month follow-up. Recurrent bronchitis. Associated PND and wheezing. He gets most viral illness his son will get. Cough has been worse last 4 weeks ago. He had an ear infection at the same time. He saw ENT. He has been on 14 day course Augmentin and two rounds of prednisone. He uses Astelin/Fluticasone nasal spray at night. CPAP is working well. He feels more rested.   Airview download 12/10/21-02/07/22 60/60 days (100%); 52 days (87%) > 4 hours Average usage 6 hours 16 mins Pressure 8-18cm h20 (12.9cm h20-95%) Airleaks 16.8L/min AHI 2.4  OSA: - Patient is compliant with CPAP and reports benefit from use  - Pressure 8-18cm h20; Residual AHI 2.4 - No changes  - Continue to encourage patient wear CPAP nightly  Chronic sinusitis with recurrent bronchitis - Not acutely flared. Eos 300. Maintained on Astelin/fluticasone nasal spray. Starting Singulair 10mg  at bedtime. Checking PFTs. Following with ENT.    05/12/2022 Patient presents today for 77-month follow-up.  He has sleep apnea and chronic sinusitis with recurrent bronchitis.  During her last office visit he was started on Singulair 10 mg at bedtime. Pulmonary function testing in January showed normal lung function, no evidence of obstructive lung disease. He is having issues with chronic sinusitis and hearing. Continue to follow with ear nose and throat. Associated chest tightness. Using Albuterol more frequently than he'd like upwards of 5-6 times a day with temporary improvement. He is getting up mucus. CXR on 05/03/22 showed clear lungs.   He is doing well sleep wise. He is 97% compliant with CPAP use greater than 4 hours over the last 30 days.  Current pressure 8 to 18 cm  H2O; residual AHI 1.3/hour. He takes adderrall 30mg  daily and 15mg  as needed.   Airview download 04/11/22-05/10/22 Usage days 29/30 days (97%) greater than 4 hours Average usage 6 hours 44 minutes Pressure 8 to 18 cm H2O (13.7 cm H2O-95%) Air leaks 22.8 L/min (95%) AHI 1.3  Moderate obstructive sleep apnea - HST 06/03/21 >> moderate obstructive sleep apnea, AHI 23.4/hr with SpO2 low 70% (average 90%). Patient reports clinical benefit in sleep quality and daytime fatigue. He is drinking less caffeine. Current CPAP pressure 8-18cm h20; Residual AHI 1.3/hour. No changes today.  Sinew to encourage patient aim to wear CPAP nightly for 4 to 6 hours or longer.  Encouraged weight loss efforts.  Follow-up in 6 months or sooner if needed.   Chronic sinusitis with recurrent bronchitis - Never smoked; Recurrent sinusitis - Continue Astelin/fluticasone nasal spray; Singulair 10mg  at bedtime - Continue follow with ENT    Asthma - Patient has clincal symptoms of shortness of breath with exertion and chest tightness. Reports temporary improvement with SABA - PFTs were normal in  January 2024 without evidence of obstructive airway disease, there was evidence of air trapping - CXR in March 2024 showed clear lungs  - Recommend trial low dose ICS/LABA - Advair 45-57mcg two puffs q 12 hours  - FU in 4 months or sooner if needed   10/25/2022- Interim hx  Patient presents today for 6 month follow-up OSA and asthma. Home sleep study on 06/03/2021 showed moderate obstructive sleep apnea, AHI 23.4 an hour. Maintained on Auto CPAP. He had right tympanomastoidectomy and tube placement on July 30th with [facility] with Dr. Ignacia Felling. His hearing is better. Still having's some drainage. He has some minor nasal congestion which he reports is not as bad as it was previously. He is able to use flonase and Astelin as needed. He was cleared to resume CPAP last week. Tolerating pressure alright. Sleeping well. He reports  significant benefit in sleep quality with use, he was miserable being off CPAP post op.   His breathing improved with ICS/LABA, he can see a difference when not using Not experiencing significant shortness of breath or chest tightness  Using Advair 45-35mcg two puffs twice daily  Rarely using SABA  Airview download 07/27/2022 - 10/24/2022 Usage 55/90 days (61%) > 4 hours Usage days used 7 hours 21 mins Pressure 8-18cm h20 (13.9cm h20) Airleaks 32.9L (95%) AHI 2.1   04/24/2023- Interim hx Discussed the use of AI scribe software for clinical note transcription with the patient, who gave verbal consent to proceed.  History of Present Illness   The patient, with obstructive sleep apnea and asthma, presents for follow-up.  He is being followed for obstructive sleep apnea, managed with CPAP therapy. Compliance is at 93%, with an average usage of 6 hours and 22 minutes per night. The CPAP machine is set to auto-adjust between pressures of 8 to 18,. He notes occasional air leaks and mentions that he is due for a new mask. Significant differences in energy and alertness are noted when not using the CPAP, with feelings of grogginess and lack of energy, along with worsened chest and nasal symptoms.  He has a history of asthma, which was well-controlled as of September, with minimal use of his rescue inhaler, albuterol, and is on a low-dose Advair regimen. Since January, he has experienced two significant respiratory illnesses. The first episode involved severe breathing difficulty, requiring nebulizer treatment and home use of a nebulizer for two weeks, associated with an ear infection, sinus infection, and chest infection, treated with prednisone, a nasal spray, and ear drops. The second episode, occurring about two to three weeks ago, was a severe flu, confirmed by a positive test, resulting in a 12-pound weight loss due to inability to eat or drink for three days. Post-flu, he reports persistent chest  weakness and increased use of his rescue inhaler. He describes episodes of chest congestion and shortness of breath, particularly in the mornings and randomly throughout the day. Nasal congestion and occasional waking at night feeling unable to breathe, leading to removal of his CPAP mask, are also reported.  He has been taking Singulair (montelukast), which he finds helpful, and occasionally uses ibuprofen. Symptoms have been slowly improving, with this week being better than the last, but he still experiences chest soreness and fluid sounds in his chest when exhaling. His asthma symptoms were well-managed in September, following surgery, but have since worsened with recent illnesses.    Airview download 03/24/2023 - 04/22/2023 Usage days 28/30 Average usage 6 hours 22 mins Pressure 8-18cm h20  Airleaks 32 L/min (95%) AHI 2.3   Allergies  Allergen Reactions   Vueway [Gadopiclenol] Nausea Only    Immunization History  Administered Date(s) Administered   Influenza Inj Mdck Quad Pf 12/16/2021   Influenza,inj,Quad PF,6+ Mos 01/17/2016, 10/13/2016   Influenza,inj,quad, With Preservative 12/03/2017   Meningococcal polysaccharide vaccine (MPSV4) 02/13/2009   PFIZER(Purple Top)SARS-COV-2 Vaccination 04/15/2019, 05/16/2019, 01/03/2020   Pfizer(Comirnaty)Fall Seasonal Vaccine 12 years and older 12/16/2021   Tdap 02/13/2009, 12/21/2017    Past Medical History:  Diagnosis Date   ADD (attention deficit disorder)    Anxiety    Narcolepsy through school    Tobacco History: Social History   Tobacco Use  Smoking Status Never  Smokeless Tobacco Never   Counseling given: Not Answered   Outpatient Medications Prior to Visit  Medication Sig Dispense Refill   albuterol (PROVENTIL) (2.5 MG/3ML) 0.083% nebulizer solution Inhale 2 vials ( 5 mg) by nebulization 4 times a day. 180 mL 0   albuterol (PROVENTIL) (5 MG/ML) 0.5% nebulizer solution Use 2 vials (5mg ) by nebulization 4 times a day as needed  for wheezing. 60 mL 0   albuterol (VENTOLIN HFA) 108 (90 Base) MCG/ACT inhaler Inhale 2 puffs into the lungs every 4 (four) hours as needed for wheezing or shortness of breath. 6.7 g 1   amphetamine-dextroamphetamine (ADDERALL) 30 MG tablet Take 1 tablet (30 mg) by mouth 2 (two) times daily. May also 1/2 tablet (15 mg) daily as needed. 75 tablet 0   Azelastine HCl 137 MCG/SPRAY SOLN Place 2 sprays into each nostril 2 (two) times daily as needed (nasal symptoms). 30 mL 99   benzonatate (TESSALON) 200 MG capsule Take 1 capsule (200 mg total) by mouth 3 (three) times daily as needed for cough. 20 capsule 0   buPROPion (WELLBUTRIN XL) 300 MG 24 hr tablet Take 1 tablet (300 mg total) by mouth daily. 90 tablet 3   Cholecalciferol (VITAMIN D3) 1.25 MG (50000 UT) CAPS Take 1 capsule by mouth once a week at 9am. 12 capsule 3   clomiPHENE (CLOMID) 50 MG tablet Take 0.5 tablets (25 mg total) by mouth every other day. Patient must schedule appointment for further refills. 30 tablet 2   cyclobenzaprine (FLEXERIL) 5 MG tablet Take 5-10 mg by mouth at bedtime as needed.     levothyroxine (SYNTHROID) 25 MCG tablet Take 1 tablet (25 mcg total) by mouth daily. 90 tablet 1   methocarbamol (ROBAXIN) 500 MG tablet Take 1 tablet (500 mg total) by mouth 4 (four) times daily as needed for muscle pain/spasm. 120 tablet 1   montelukast (SINGULAIR) 10 MG tablet TAKE 1 TABLET BY MOUTH EVERYDAY AT BEDTIME 90 tablet 3   Oxycodone HCl 10 MG TABS Take 1 tablet (10 mg total) by mouth every 8 (eight) hours as needed for pain. Max daily amount: 30 mg. 90 tablet 0   traZODone (DESYREL) 50 MG tablet Take 1-2 tablets (50-100 mg total) by mouth at bedtime. 180 tablet 0   fluticasone-salmeterol (ADVAIR HFA) 45-21 MCG/ACT inhaler Inhale 2 puffs into the lungs 2 (two) times daily. 12 g 3   albuterol (VENTOLIN HFA) 108 (90 Base) MCG/ACT inhaler Inhale 2 puffs into the lungs every 4 (four) hours as needed for wheezing or shortness of breath.  (Patient not taking: Reported on 04/24/2023) 6.7 g 1   albuterol (VENTOLIN HFA) 108 (90 Base) MCG/ACT inhaler Inhale 2 puffs into the lungs every 4 (four) hours as needed for wheezing or shortness of breath. (Patient not taking: Reported on  04/24/2023) 6.7 g 1   amoxicillin-clavulanate (AUGMENTIN) 875-125 MG tablet Take one tablet by mouth 2 (two) times daily for 10 days. (Patient not taking: Reported on 04/24/2023) 20 tablet 0   amphetamine-dextroamphetamine (ADDERALL) 30 MG tablet Take 1 tablet by mouth 2 (two) times daily, may take additional 1/2 tablet as needed. (Patient not taking: Reported on 04/24/2023) 75 tablet 0   benzonatate (TESSALON) 200 MG capsule Take 1 capsule (200 mg total) by mouth 3 (three) times daily as needed for cough. (Patient not taking: Reported on 04/24/2023) 20 capsule 0   chlorpheniramine-HYDROcodone (TUSSIONEX) 10-8 MG/5ML Take 5 mLs by mouth every 12 (twelve) hours as needed for Cough. Max Daily Amount: 10 mLs (Patient not taking: Reported on 04/24/2023) 70 mL 0   ciprofloxacin-dexamethasone (CIPRODEX) OTIC suspension Place 4 drops into the right ear 2 (two) times daily for 10 days. (Patient not taking: Reported on 04/24/2023) 7.5 mL 0   clomiPHENE (CLOMID) 50 MG tablet Take 1/2 tablet (25 mg dose) by mouth every other day. (Patient not taking: Reported on 04/24/2023) 30 tablet 0   clomiPHENE (CLOMID) 50 MG tablet Take 0.5 tablets (25 mg total) by mouth every other day. (Patient not taking: Reported on 04/24/2023) 30 tablet 2   clomiPHENE (CLOMID) 50 MG tablet Take one half tablet (25 mg dose) by mouth every other day. (Patient not taking: Reported on 04/24/2023) 30 tablet 2   cyclobenzaprine (FLEXERIL) 5 MG tablet Take 1 tablet (5 mg total) by mouth 2 (two) times daily. (Not with methocarbamol, but if methocarbamol isn't effective) (Patient not taking: Reported on 04/24/2023) 30 tablet 0   DULoxetine (CYMBALTA) 20 MG capsule Take 2 capsules (40 mg total) by mouth daily. (Patient not  taking: Reported on 04/24/2023) 60 capsule 0   ipratropium (ATROVENT) 0.06 % nasal spray Use 2 sprays in each nostril 3 (three) times a day. (Patient not taking: Reported on 04/24/2023) 15 mL 2   levothyroxine (SYNTHROID) 25 MCG tablet Take 1 tablet (25 mcg total) by mouth daily. (Patient not taking: Reported on 04/24/2023) 90 tablet 1   levothyroxine (SYNTHROID) 25 MCG tablet TAKE 1 TABLET BY MOUTH EVERY DAY (Patient not taking: Reported on 04/24/2023) 90 tablet 1   naloxone (NARCAN) nasal spray 4 mg/0.1 mL Use 1 spray in nose route once as needed for Opioid Reversal (Patient not taking: Reported on 04/24/2023) 2 each 0   ofloxacin (FLOXIN) 0.3 % OTIC solution Place 10 drops into the right ear as directed daily for 7 days. (Patient not taking: Reported on 04/24/2023) 10 mL 0   Oxycodone HCl 10 MG TABS Take 1 tablet (10 mg) by mouth every 8 hours as needed for pain (Patient not taking: Reported on 04/24/2023) 90 tablet 0   Oxycodone HCl 10 MG TABS Take 1 tablet (10 mg total) by mouth every 8 (eight) hours as needed for pain (Patient not taking: Reported on 04/24/2023) 90 tablet 0   Oxycodone HCl 10 MG TABS Take 1 tablet (10 mg total) by mouth every 8 (eight) hours as needed for pain. Max daily amount: 30mg  (Patient not taking: Reported on 04/24/2023) 90 tablet 0   Oxycodone HCl 10 MG TABS Take 1 tablet (10 mg total) by mouth every 8 (eight) hours as needed. Daily amount 30 mg 90 tablet 0   traZODone (DESYREL) 50 MG tablet TAKE 1/2 TO 1 TABLET BY MOUTH AT BEDTIME AS NEEDED FOR SLEEP 90 tablet 2   No facility-administered medications prior to visit.    Review of  Systems  Review of Systems  Constitutional: Negative.   HENT:  Positive for congestion.   Respiratory:  Positive for shortness of breath.   Cardiovascular: Negative.     Physical Exam  BP 134/72 (BP Location: Right Arm, Patient Position: Sitting, Cuff Size: Large)   Pulse 77   Temp (!) 97.1 F (36.2 C) (Temporal)   Ht 6' (1.829 m)   Wt  (!) 301 lb (136.5 kg)   SpO2 98%   BMI 40.82 kg/m  Physical Exam Constitutional:      Appearance: Normal appearance.  HENT:     Head: Normocephalic and atraumatic.  Cardiovascular:     Rate and Rhythm: Normal rate and regular rhythm.  Pulmonary:     Effort: Pulmonary effort is normal.     Breath sounds: Normal breath sounds.  Neurological:     General: No focal deficit present.     Mental Status: He is alert and oriented to person, place, and time. Mental status is at baseline.  Psychiatric:        Mood and Affect: Mood normal.        Behavior: Behavior normal.        Thought Content: Thought content normal.        Judgment: Judgment normal.      Lab Results:  CBC    Component Value Date/Time   WBC 7.6 04/24/2023 0944   RBC 4.76 04/24/2023 0944   HGB 15.4 04/24/2023 0944   HCT 44.9 04/24/2023 0944   PLT 143.0 (L) 04/24/2023 0944   MCV 94.3 04/24/2023 0944   MCH 31.6 04/24/2015 1148   MCHC 34.4 04/24/2023 0944   RDW 14.0 04/24/2023 0944   LYMPHSABS 2.0 04/24/2023 0944   MONOABS 0.7 04/24/2023 0944   EOSABS 0.2 04/24/2023 0944   BASOSABS 0.0 04/24/2023 0944    BMET    Component Value Date/Time   NA 139 12/26/2022 0842   NA 144 01/17/2016 0945   K 3.8 12/26/2022 0842   CL 107 12/26/2022 0842   CO2 23 12/26/2022 0842   GLUCOSE 99 12/26/2022 0842   BUN 15 12/26/2022 0842   BUN 25 (H) 01/17/2016 0945   CREATININE 1.13 12/26/2022 0842   CREATININE 0.92 04/24/2015 1148   CALCIUM 9.0 12/26/2022 0842   GFRNONAA 110 01/17/2016 0945   GFRAA 127 01/17/2016 0945    BNP No results found for: "BNP"  ProBNP No results found for: "PROBNP"  Imaging: DG INJECT DIAG/THERA/INC NEEDLE/CATH/PLC EPI/CERV/THOR W/IMG Result Date: 04/16/2023 CLINICAL DATA:  Thoracolumbar pain radiating around the right rib cage. Regional degenerative disc disease. T10-11 epidural requested. FLUOROSCOPY: Radiation Exposure Index (as provided by the fluoroscopic device): 1 minute 2 seconds.  75.07 micro gray meter squared PROCEDURE: CERVICAL EPIDURAL INJECTION An interlaminar approach was performed on the right at T10-11. A 20 gauge epidural needle was advanced using loss-of-resistance technique. DIAGNOSTIC EPIDURAL INJECTION Injection of Isovue-M 300 shows a good epidural pattern with spread above and below the level of needle placement, primarily on the right, but with definite spread also to the left. No vascular opacification is seen. THERAPEUTIC EPIDURAL INJECTION 1.5 ml of Kenalog 40 mixed with 1 ml of 1% Lidocaine and 2 ml of normal saline were then instilled. The procedure was well-tolerated, and the patient was discharged thirty minutes following the injection in good condition. IMPRESSION: Technically successful initial thoracic region epidural injection on the right at T10-11. Electronically Signed   By: Paulina Fusi M.D.   On: 04/16/2023 07:51  Assessment & Plan:   1. Mild intermittent asthma, unspecified whether complicated (Primary) - CBC w/Diff; Future - IgE; Future - IgE - CBC w/Diff  2. Chronic sinusitis with recurrent bronchitis - CBC w/Diff; Future - IgE; Future - IgE - CBC w/Diff  3. Moderate obstructive sleep apnea - Pulse oximetry, overnight; Future  Assessment and Plan    Asthma Asthma exacerbations since January 2025, likely due to upper respiratory infections and influenza. Elevated eosinophil count suggests exacerbation. Discussed increasing inhaled steroid dose and potential biologic therapy to prevent frequent prednisone use and airway remodeling. - Increase Advair to 115-51mcg two puffs q 12 hours - Order CBC with differential and IgE for allergy testing. - Consider biologic therapy if exacerbations persist. - Monitor for need of prednisone   Obstructive Sleep Apnea Chronic obstructive sleep apnea well-controlled on CPAP therapy with 93% compliance and significant symptomatic benefit. - Continue current CPAP settings. - Ensure supply of  new mask from medical supply store.  Upper Respiratory Infections Recent upper respiratory infections including sinusitis and bronchitis, treated with prednisone, nasal ipratropium, and ear drops. Influenza confirmed, causing weight loss and exacerbation of respiratory symptoms. Symptoms improving. - Consider further evaluation if symptoms persist.  Musculoskeletal Chest Pain Intermittent chest pain likely musculoskeletal, improved with epidural steroid injection at T10. Possible overlap with asthma-related chest tightness. - Consider further musculoskeletal evaluation if symptoms persist.      Glenford Bayley, NP 05/13/2023

## 2023-04-24 NOTE — Patient Instructions (Signed)
Injections in backside today See me again in 6 weeks

## 2023-04-24 NOTE — Patient Instructions (Signed)
-  ASTHMA: Asthma is a condition where your airways narrow and swell, making it hard to breathe. Your asthma has worsened since January due to respiratory infections and the flu. We will increase your Advair dose to a medium level and order blood tests to check for allergies. If your symptoms continue, we may consider biologic therapy to reduce the need for prednisone and prevent long-term airway damage.  -OBSTRUCTIVE SLEEP APNEA: Obstructive sleep apnea is a condition where your breathing stops and starts during sleep. Your condition is well-controlled with your CPAP machine, which you are using regularly. Please continue with your current CPAP settings and get a new mask from the medical supply store. We will replace order for ONO, wear CPAP night of testing.   -UPPER RESPIRATORY INFECTIONS: You have had recent upper respiratory infections, including sinusitis, bronchitis, and the flu, which have worsened your asthma symptoms. Your symptoms are improving, but if they persist, we may need to do further evaluation.  -MUSCULOSKELETAL CHEST PAIN: Your chest pain is likely due to muscle issues and has improved with a steroid injection. If the pain continues, we may need to do further evaluation.  INSTRUCTIONS: Please follow up with the recommended blood tests (CBC with differential and IgE) and get a new CPAP mask. If your symptoms persist or worsen, please schedule another appointment for further evaluation.  Orders: Labs ONO   Follow-up 6 months with Beth NP or sooner if needed

## 2023-04-24 NOTE — Telephone Encounter (Signed)
 Patient states that he was never contact regarding ONO on CPAP that was ordered back in March and re-addressed in feb on this telephone encounter.  I will re-order but can we reach out to [facility]

## 2023-04-24 NOTE — Assessment & Plan Note (Addendum)
 Improved after the thoracic T10 epidural.  Can consider the possibility of a repeat or nerve root ablation if necessary.  Hopeful that this will not be necessary.  Does respond relatively well to home exercises and icing regimen.  Discussed which activities to do and which ones to avoid.  Increase activity slowly otherwise.  Follow-up again in 6 to 8 weeks patient has become more active but is some concern.  Toradol and Depo-Medrol injections given today as well.  Return to lifting progression

## 2023-04-25 ENCOUNTER — Other Ambulatory Visit (HOSPITAL_COMMUNITY): Payer: Self-pay

## 2023-04-25 LAB — IGE: IgE (Immunoglobulin E), Serum: 31 kU/L (ref <=114)

## 2023-04-27 ENCOUNTER — Other Ambulatory Visit (HOSPITAL_COMMUNITY): Payer: Self-pay

## 2023-04-30 ENCOUNTER — Other Ambulatory Visit (HOSPITAL_COMMUNITY): Payer: Self-pay

## 2023-05-02 ENCOUNTER — Other Ambulatory Visit (HOSPITAL_COMMUNITY): Payer: Self-pay

## 2023-05-03 ENCOUNTER — Other Ambulatory Visit (HOSPITAL_COMMUNITY): Payer: Self-pay

## 2023-05-03 MED ORDER — OXYCODONE HCL 10 MG PO TABS
10.0000 mg | ORAL_TABLET | Freq: Three times a day (TID) | ORAL | 0 refills | Status: DC | PRN
  Filled 2023-05-03: qty 90, 30d supply, fill #0

## 2023-05-10 NOTE — Progress Notes (Signed)
 [name] [address] [place] [address] Phone: [phone] Subjective:   [name], am serving as a scribe for Dr. Antoine Primas.  I'm seeing this patient by the request  of:  Porfirio Oar, PA  CC: Back pain follow-up  [id number]  Frank [name] is a 33 y.o. male coming in with complaint of back and neck pain. OMT 04/24/2023. Patient states L hip has been giving him some discomfort. Walking is the best, sitting and bending not great.  Medications patient has been prescribed: None  Taking:       Reviewed prior external information including notes and imaging from previsou exam, outside providers and external EMR if available.   As well as notes that were available from care everywhere and other healthcare systems.  Past medical history, social, surgical and family history all reviewed in electronic medical record.  No pertanent information unless stated regarding to the chief complaint.   Past Medical History:  Diagnosis Date   ADD (attention deficit disorder)    Anxiety    Narcolepsy through school    Allergies  Allergen Reactions   Vueway [Gadopiclenol] Nausea Only     Review of Systems:  No headache, visual changes, nausea, vomiting, diarrhea, constipation, dizziness, abdominal pain, skin rash, fevers, chills, night sweats, weight loss, swollen lymph nodes, body aches, joint swelling, chest pain, shortness of breath, mood changes. POSITIVE muscle aches  Objective  Blood pressure 124/88, pulse 97, height 6' (1.829 m), weight 299 lb (135.6 kg), SpO2 98%.   General: No apparent distress alert and oriented x3 mood and affect normal, dressed appropriately.  HEENT: Pupils equal, extraocular movements intact  Respiratory: Patient's speak in full sentences and does not appear short of breath  Cardiovascular: No lower extremity edema, non tender, no erythema  MSK:  Back does have some loss lordosis noted.  Poor core  strength noted.  Tightness noted in the gluteus medius bilaterally.  More tenderness no noted over the left sacroiliac.  Negative straight leg test noted today.  Does have tightness noted  Osteopathic findings  C2 flexed rotated and side bent right C6 flexed rotated and side bent left T3 extended rotated and side bent left inhaled rib T7 extended rotated and side bent left L1 flexed rotated and side bent right Sacrum left the left    Assessment and Plan:  Acute left lumbar radiculopathy Chronic, not in exacerbation noted again.  Seems to be more tightness and itching.  Has responded well to osteopathic manipulation previously as well as Toradol and Depo-Medrol injections.  Hopeful that this will make a difference.  Discussed posture and ergonomics.  Discussed avoiding certain activities and slowly progressing over the course of the next several days.  Discussed icing regimen and home exercises.  Follow-up again in 6 to 8 weeks.    Nonallopathic problems  Decision today to treat with OMT was based on Physical Exam  After verbal consent patient was treated with HVLA, ME, FPR techniques in cervical, rib, thoracic, lumbar, and sacral  areas  Patient tolerated the procedure well with improvement in symptoms  Patient given exercises, stretches and lifestyle modifications  See medications in patient instructions if given  Patient will follow up in 4-8 weeks     The above documentation has been reviewed and is accurate and complete [name], DO         Note: This dictation was prepared with Dragon dictation along with smaller phrase technology. Any transcriptional  errors that result from this process are unintentional.

## 2023-05-15 ENCOUNTER — Ambulatory Visit: Payer: BC Managed Care – PPO | Admitting: Family Medicine

## 2023-05-15 ENCOUNTER — Encounter: Payer: Self-pay | Admitting: Family Medicine

## 2023-05-15 VITALS — BP 124/88 | HR 97 | Ht 72.0 in | Wt 299.0 lb

## 2023-05-15 DIAGNOSIS — M9908 Segmental and somatic dysfunction of rib cage: Secondary | ICD-10-CM | POA: Diagnosis not present

## 2023-05-15 DIAGNOSIS — M9902 Segmental and somatic dysfunction of thoracic region: Secondary | ICD-10-CM

## 2023-05-15 DIAGNOSIS — M5416 Radiculopathy, lumbar region: Secondary | ICD-10-CM

## 2023-05-15 DIAGNOSIS — M9903 Segmental and somatic dysfunction of lumbar region: Secondary | ICD-10-CM

## 2023-05-15 DIAGNOSIS — M9904 Segmental and somatic dysfunction of sacral region: Secondary | ICD-10-CM | POA: Diagnosis not present

## 2023-05-15 DIAGNOSIS — M9901 Segmental and somatic dysfunction of cervical region: Secondary | ICD-10-CM

## 2023-05-15 DIAGNOSIS — M2550 Pain in unspecified joint: Secondary | ICD-10-CM | POA: Diagnosis not present

## 2023-05-15 MED ORDER — KETOROLAC TROMETHAMINE 60 MG/2ML IM SOLN
60.0000 mg | Freq: Once | INTRAMUSCULAR | Status: AC
  Administered 2023-05-15: 60 mg via INTRAMUSCULAR

## 2023-05-15 MED ORDER — METHYLPREDNISOLONE ACETATE 80 MG/ML IJ SUSP
80.0000 mg | Freq: Once | INTRAMUSCULAR | Status: AC
  Administered 2023-05-15: 80 mg via INTRAMUSCULAR

## 2023-05-15 NOTE — Patient Instructions (Addendum)
 Good to see you! Full cocktail today. See you again at next appointment

## 2023-05-15 NOTE — Assessment & Plan Note (Signed)
 Chronic, not in exacerbation noted again.  Seems to be more tightness and itching.  Has responded well to osteopathic manipulation previously as well as Toradol and Depo-Medrol injections.  Hopeful that this will make a difference.  Discussed posture and ergonomics.  Discussed avoiding certain activities and slowly progressing over the course of the next several days.  Discussed icing regimen and home exercises.  Follow-up again in 6 to 8 weeks.

## 2023-05-16 ENCOUNTER — Other Ambulatory Visit: Payer: Self-pay | Admitting: Primary Care

## 2023-05-19 ENCOUNTER — Other Ambulatory Visit (HOSPITAL_COMMUNITY): Payer: Self-pay

## 2023-05-22 ENCOUNTER — Other Ambulatory Visit: Payer: Self-pay

## 2023-05-22 ENCOUNTER — Other Ambulatory Visit (HOSPITAL_COMMUNITY): Payer: Self-pay

## 2023-05-22 MED ORDER — ALBUTEROL SULFATE HFA 108 (90 BASE) MCG/ACT IN AERS
2.0000 | INHALATION_SPRAY | RESPIRATORY_TRACT | 1 refills | Status: DC | PRN
Start: 2023-05-22 — End: 2023-07-17
  Filled 2023-05-22: qty 6.7, 25d supply, fill #0
  Filled 2023-06-29: qty 6.7, 25d supply, fill #1

## 2023-05-22 MED ORDER — OXYCODONE HCL 10 MG PO TABS
10.0000 mg | ORAL_TABLET | Freq: Three times a day (TID) | ORAL | 0 refills | Status: DC | PRN
Start: 2023-05-22 — End: 2023-06-19
  Filled 2023-05-22 – 2023-06-04 (×2): qty 90, 30d supply, fill #0

## 2023-05-22 MED ORDER — TRAZODONE HCL 50 MG PO TABS
50.0000 mg | ORAL_TABLET | Freq: Every day | ORAL | 1 refills | Status: AC
  Filled 2023-05-22 – 2023-06-29 (×2): qty 180, 90d supply, fill #0
  Filled 2023-10-11 – 2023-10-16 (×2): qty 180, 90d supply, fill #1

## 2023-05-22 MED ORDER — BENZONATATE 200 MG PO CAPS
200.0000 mg | ORAL_CAPSULE | Freq: Three times a day (TID) | ORAL | 0 refills | Status: DC | PRN
  Filled 2023-05-22: qty 20, 7d supply, fill #0

## 2023-05-22 MED ORDER — MONTELUKAST SODIUM 10 MG PO TABS
10.0000 mg | ORAL_TABLET | Freq: Every day | ORAL | 3 refills | Status: AC
  Filled 2023-05-22 – 2023-08-06 (×3): qty 90, 90d supply, fill #0
  Filled 2023-10-11 – 2024-02-11 (×6): qty 90, 90d supply, fill #1

## 2023-05-22 MED ORDER — VITAMIN D3 1.25 MG (50000 UT) PO CAPS
50000.0000 [IU] | ORAL_CAPSULE | ORAL | 3 refills | Status: AC
  Filled 2023-05-22: qty 12, 84d supply, fill #0
  Filled 2023-09-14: qty 12, 84d supply, fill #1
  Filled 2023-12-04: qty 12, 84d supply, fill #2

## 2023-05-22 MED ORDER — AMPHETAMINE-DEXTROAMPHETAMINE 30 MG PO TABS
ORAL_TABLET | ORAL | 0 refills | Status: DC
  Filled 2023-05-22: qty 75, 30d supply, fill #0

## 2023-05-22 MED ORDER — AZELASTINE HCL 0.1 % NA SOLN
2.0000 | Freq: Two times a day (BID) | NASAL | 98 refills | Status: AC | PRN
Start: 2023-05-22 — End: ?
  Filled 2023-05-22: qty 30, 25d supply, fill #0

## 2023-05-22 MED ORDER — BUPROPION HCL ER (XL) 300 MG PO TB24
300.0000 mg | ORAL_TABLET | Freq: Every day | ORAL | 3 refills | Status: AC
  Filled 2023-05-22 – 2023-11-11 (×3): qty 90, 90d supply, fill #0

## 2023-05-22 MED ORDER — METHOCARBAMOL 500 MG PO TABS
500.0000 mg | ORAL_TABLET | Freq: Four times a day (QID) | ORAL | 1 refills | Status: DC
  Filled 2023-05-22: qty 120, 30d supply, fill #0
  Filled 2023-07-17: qty 120, 30d supply, fill #1

## 2023-05-22 MED ORDER — LEVOTHYROXINE SODIUM 25 MCG PO TABS
25.0000 ug | ORAL_TABLET | Freq: Every day | ORAL | 1 refills | Status: DC
Start: 2023-05-22 — End: 2023-11-12
  Filled 2023-05-22 – 2023-07-17 (×3): qty 90, 90d supply, fill #0
  Filled 2023-09-03 – 2023-10-16 (×3): qty 90, 90d supply, fill #1

## 2023-06-04 ENCOUNTER — Other Ambulatory Visit (HOSPITAL_COMMUNITY): Payer: Self-pay

## 2023-06-05 NOTE — Progress Notes (Unsigned)
 [facility] [address] [place] [address] Phone: [phone] Subjective:   Frank [name], am serving as a scribe for Dr. Ronnell Coins.  I'm seeing this patient by the request  of:  Aldine Humphreys, PA  CC: Back and neck pain follow-up  [id number]  Frank [name] is a 33 y.o. male coming in with complaint of back and neck pain. OMT 05/15/2023. Patient states that his L hip is bothering him. Has not had epidural and wonders if he is feeling more than usual. Sitting increases his pain. Feels like hip is out of alignment. Epidural for T10 was helpful.   Medications patient has been prescribed: None  Taking:       Reviewed prior external information including notes and imaging from previsou exam, outside providers and external EMR if available.   As well as notes that were available from care everywhere and other healthcare systems.  Past medical history, social, surgical and family history all reviewed in electronic medical record.  No pertanent information unless stated regarding to the chief complaint.   Past Medical History:  Diagnosis Date   ADD (attention deficit disorder)    Anxiety    Narcolepsy through school    Allergies  Allergen Reactions   Vueway  [Gadopiclenol ] Nausea Only     Review of Systems:  No headache, visual changes, nausea, vomiting, diarrhea, constipation, dizziness, abdominal pain, skin rash, fevers, chills, night sweats, weight loss, swollen lymph nodes, body aches, joint swelling, chest pain, shortness of breath, mood changes. POSITIVE muscle aches  Objective  Blood pressure 120/80, pulse 73, height 6' (1.829 m), weight (!) 302 lb (137 kg), SpO2 98%.   General: No apparent distress alert and oriented x3 mood and affect normal, dressed appropriately.  HEENT: Pupils equal, extraocular movements intact  Respiratory: Patient's speak in full sentences and does not appear short of breath  Cardiovascular: No  lower extremity edema, non tender, no erythema  Gait relatively normal. MSK:  Back does have some loss of lordosis noted.  Some tenderness to palpation of the paraspinal musculature.  Right wrist continues to have some discomfort over the lunate bone itself.  No significant swelling noted today.  Osteopathic findings  C2 flexed rotated and side bent right C5 flexed rotated and side bent left T3 extended rotated and side bent right inhaled rib T5 extended rotated and side bent left L2 flexed rotated and side bent right Sacrum right on right    Assessment and Plan:  Degeneration of intervertebral disc of thoracic region Continues to have some difficulty with this.  Still seems to be more the thoracolumbar juncture.  Is making progress though.  Still needs to work on core strength.  Toradol  and Depo-Medrol  injections given today.  Discussed icing regimen and home exercises, increase activity slowly.  Follow-up again in 6 to 8 weeks.    Nonallopathic problems  Decision today to treat with OMT was based on Physical Exam  After verbal consent patient was treated with HVLA, ME, FPR techniques in cervical, rib, thoracic, lumbar, and sacral  areas  Patient tolerated the procedure well with improvement in symptoms  Patient given exercises, stretches and lifestyle modifications  See medications in patient instructions if given  Patient will follow up in 4-8 weeks    The above documentation has been reviewed and is accurate and complete Frank Mella M Corrado Hymon, DO       Note: This dictation was prepared with Dragon dictation along with smaller phrase technology.  Any transcriptional errors that result from this process are unintentional.

## 2023-06-06 ENCOUNTER — Encounter: Payer: Self-pay | Admitting: Family Medicine

## 2023-06-06 ENCOUNTER — Ambulatory Visit: Payer: BC Managed Care – PPO | Admitting: Family Medicine

## 2023-06-06 VITALS — BP 120/80 | HR 73 | Ht 72.0 in | Wt 302.0 lb

## 2023-06-06 DIAGNOSIS — M542 Cervicalgia: Secondary | ICD-10-CM

## 2023-06-06 DIAGNOSIS — M9903 Segmental and somatic dysfunction of lumbar region: Secondary | ICD-10-CM | POA: Diagnosis not present

## 2023-06-06 DIAGNOSIS — M545 Low back pain, unspecified: Secondary | ICD-10-CM

## 2023-06-06 DIAGNOSIS — M9904 Segmental and somatic dysfunction of sacral region: Secondary | ICD-10-CM

## 2023-06-06 DIAGNOSIS — M9901 Segmental and somatic dysfunction of cervical region: Secondary | ICD-10-CM | POA: Diagnosis not present

## 2023-06-06 DIAGNOSIS — M9902 Segmental and somatic dysfunction of thoracic region: Secondary | ICD-10-CM | POA: Diagnosis not present

## 2023-06-06 DIAGNOSIS — M9908 Segmental and somatic dysfunction of rib cage: Secondary | ICD-10-CM

## 2023-06-06 MED ORDER — METHYLPREDNISOLONE ACETATE 80 MG/ML IJ SUSP
80.0000 mg | Freq: Once | INTRAMUSCULAR | Status: AC
  Administered 2023-06-06: 80 mg via INTRAMUSCULAR

## 2023-06-06 MED ORDER — KETOROLAC TROMETHAMINE 60 MG/2ML IM SOLN
60.0000 mg | Freq: Once | INTRAMUSCULAR | Status: AC
  Administered 2023-06-06: 60 mg via INTRAMUSCULAR

## 2023-06-06 NOTE — Patient Instructions (Signed)
 Injections in backside Overall glad you are improving Watch the wrist See me as scheduled

## 2023-06-06 NOTE — Assessment & Plan Note (Signed)
 Continues to have some difficulty with this.  Still seems to be more the thoracolumbar juncture.  Is making progress though.  Still needs to work on core strength.  Toradol  and Depo-Medrol  injections given today.  Discussed icing regimen and home exercises, increase activity slowly.  Follow-up again in 6 to 8 weeks.

## 2023-06-07 ENCOUNTER — Other Ambulatory Visit (HOSPITAL_COMMUNITY): Payer: Self-pay

## 2023-06-07 MED ORDER — METHYLPREDNISOLONE 4 MG PO TBPK
ORAL_TABLET | ORAL | 0 refills | Status: AC
Start: 2023-06-07 — End: ?
  Filled 2023-06-07: qty 21, 6d supply, fill #0

## 2023-06-07 MED ORDER — CIPROFLOXACIN-DEXAMETHASONE 0.3-0.1 % OT SUSP
4.0000 [drp] | Freq: Two times a day (BID) | OTIC | 4 refills | Status: DC
Start: 2023-06-07 — End: 2023-10-12
  Filled 2023-06-07: qty 7.5, 19d supply, fill #0
  Filled 2023-06-29: qty 7.5, 19d supply, fill #1
  Filled 2023-07-17: qty 7.5, 19d supply, fill #2
  Filled 2023-08-06: qty 7.5, 19d supply, fill #3
  Filled 2023-09-03: qty 7.5, 19d supply, fill #4

## 2023-06-12 ENCOUNTER — Encounter: Payer: Self-pay | Admitting: Family Medicine

## 2023-06-19 ENCOUNTER — Other Ambulatory Visit (HOSPITAL_COMMUNITY): Payer: Self-pay

## 2023-06-19 MED ORDER — AMPHETAMINE-DEXTROAMPHETAMINE 30 MG PO TABS
30.0000 mg | ORAL_TABLET | Freq: Two times a day (BID) | ORAL | 0 refills | Status: DC
  Filled 2023-06-19: qty 75, 30d supply, fill #0

## 2023-06-19 MED ORDER — IPRATROPIUM BROMIDE 0.06 % NA SOLN
2.0000 | Freq: Three times a day (TID) | NASAL | 5 refills | Status: AC
  Filled 2023-06-19: qty 15, 25d supply, fill #0
  Filled 2023-08-06: qty 15, 25d supply, fill #1
  Filled 2023-09-03: qty 15, 25d supply, fill #2
  Filled 2023-09-14 – 2023-10-16 (×3): qty 15, 25d supply, fill #3
  Filled 2023-11-11: qty 15, 25d supply, fill #4
  Filled 2023-12-04: qty 15, 25d supply, fill #5

## 2023-06-19 MED ORDER — OXYCODONE HCL 10 MG PO TABS
10.0000 mg | ORAL_TABLET | Freq: Three times a day (TID) | ORAL | 0 refills | Status: DC | PRN
  Filled 2023-06-19 – 2023-07-02 (×2): qty 90, 30d supply, fill #0

## 2023-06-21 NOTE — Progress Notes (Signed)
 [name] [address] [place] [address] Phone: [phone] Subjective:   Frank [name], am serving as a scribe for Dr. Ronnell Coins.  I'm seeing this patient by the request  of:  Aldine Humphreys, PA  CC: Back and neck pain follow-up  [id number]  Frank [name] is a 33 y.o. male coming in with complaint of back and neck pain. OMT 06/06/2023. Patient states doing pretty good today. Just normal aches and pains. Low back is bothersome.  Medications patient has been prescribed: None  Taking:         Reviewed prior external information including notes and imaging from previsou exam, outside providers and external EMR if available.   As well as notes that were available from care everywhere and other healthcare systems.  Past medical history, social, surgical and family history all reviewed in electronic medical record.  No pertanent information unless stated regarding to the chief complaint.   Past Medical History:  Diagnosis Date   ADD (attention deficit disorder)    Anxiety    Narcolepsy through school    Allergies  Allergen Reactions   Vueway  [Gadopiclenol ] Nausea Only     Review of Systems:  No headache, visual changes, nausea, vomiting, diarrhea, constipation, dizziness, abdominal pain, skin rash, fevers, chills, night sweats, weight loss, swollen lymph nodes, body aches, joint swelling, chest pain, shortness of breath, mood changes. POSITIVE muscle aches  Objective  Blood pressure 122/86, pulse 90, height 6' (1.829 m), weight (!) 302 lb (137 kg), SpO2 98%.   General: No apparent distress alert and oriented x3 mood and affect normal, dressed appropriately.  HEENT: Pupils equal, extraocular movements intact  Respiratory: Patient's speak in full sentences and does not appear short of breath  Cardiovascular: No lower extremity edema, non tender, no erythema  Gait relatively normal MSK:  Back significant tightness  noted.  Tightness noted at the neck left greater than right.  Seems to be in the occipital area.  Osteopathic findings  C3 flexed rotated and side bent right C6 flexed rotated and side bent left T3 extended rotated and side bent right inhaled rib T7 extended rotated and side bent left L3 flexed rotated and side bent right Sacrum right on right    Assessment and Plan:  Degeneration of intervertebral disc of thoracic region Degenerative disc disease.  Discussed icing regimen.  Discussed diet.  She was also having some piriformis syndrome on the left side.  Could be potentially low lumbar radiculopathy as well.  Will consider the possibility of epidurals but try to hold off and keep intervals relatively long.  Patient is working on weight loss at the moment.  Started medication.  Patient does probably have nearly 100 pounds to lose.  Do think that this would be significantly beneficial for this individual.  Toradol  and Depo-Medrol  injection was given today follow-up again in 4 to 6 weeks.    Nonallopathic problems  Decision today to treat with OMT was based on Physical Exam  After verbal consent patient was treated with HVLA, ME, FPR techniques in cervical, rib, thoracic, lumbar, and sacral  areas  Patient tolerated the procedure well with improvement in symptoms  Patient given exercises, stretches and lifestyle modifications  See medications in patient instructions if given  Patient will follow up in 4-8 weeks     The above documentation has been reviewed and is accurate and complete Frank Dalpe M Shaylah Mcghie, DO         Note: This  dictation was prepared with Dragon dictation along with smaller phrase technology. Any transcriptional errors that result from this process are unintentional.

## 2023-06-25 ENCOUNTER — Other Ambulatory Visit (HOSPITAL_COMMUNITY): Payer: Self-pay

## 2023-06-27 ENCOUNTER — Ambulatory Visit: Payer: BC Managed Care – PPO | Admitting: Family Medicine

## 2023-06-27 ENCOUNTER — Encounter: Payer: Self-pay | Admitting: Family Medicine

## 2023-06-27 VITALS — BP 122/86 | HR 90 | Ht 72.0 in | Wt 302.0 lb

## 2023-06-27 DIAGNOSIS — M9904 Segmental and somatic dysfunction of sacral region: Secondary | ICD-10-CM | POA: Diagnosis not present

## 2023-06-27 DIAGNOSIS — M9902 Segmental and somatic dysfunction of thoracic region: Secondary | ICD-10-CM | POA: Diagnosis not present

## 2023-06-27 DIAGNOSIS — M5134 Other intervertebral disc degeneration, thoracic region: Secondary | ICD-10-CM

## 2023-06-27 DIAGNOSIS — M5416 Radiculopathy, lumbar region: Secondary | ICD-10-CM | POA: Diagnosis not present

## 2023-06-27 DIAGNOSIS — M9908 Segmental and somatic dysfunction of rib cage: Secondary | ICD-10-CM

## 2023-06-27 DIAGNOSIS — M9903 Segmental and somatic dysfunction of lumbar region: Secondary | ICD-10-CM | POA: Diagnosis not present

## 2023-06-27 DIAGNOSIS — M9901 Segmental and somatic dysfunction of cervical region: Secondary | ICD-10-CM | POA: Diagnosis not present

## 2023-06-27 MED ORDER — METHYLPREDNISOLONE ACETATE 80 MG/ML IJ SUSP
80.0000 mg | Freq: Once | INTRAMUSCULAR | Status: AC
  Administered 2023-06-27: 80 mg via INTRAMUSCULAR

## 2023-06-27 MED ORDER — KETOROLAC TROMETHAMINE 30 MG/ML IJ SOLN
60.0000 mg | Freq: Once | INTRAMUSCULAR | Status: AC
  Administered 2023-06-27: 60 mg via INTRAMUSCULAR

## 2023-06-27 NOTE — Assessment & Plan Note (Signed)
 Degenerative disc disease.  Discussed icing regimen.  Discussed diet.  She was also having some piriformis syndrome on the left side.  Could be potentially low lumbar radiculopathy as well.  Will consider the possibility of epidurals but try to hold off and keep intervals relatively long.  Patient is working on weight loss at the moment.  Started medication.  Patient does probably have nearly 100 pounds to lose.  Do think that this would be significantly beneficial for this individual.  Toradol  and Depo-Medrol  injection was given today follow-up again in 4 to 6 weeks.

## 2023-06-27 NOTE — Patient Instructions (Signed)
 Cocktail injection today Update after memorial day [place] ball in L back pocket with sitting See you again in 4-6 weeks

## 2023-06-27 NOTE — Addendum Note (Signed)
 Addended by: Leone Ralphs D on: 06/27/2023 08:36 AM   Modules accepted: Orders

## 2023-06-29 ENCOUNTER — Other Ambulatory Visit (HOSPITAL_COMMUNITY): Payer: Self-pay

## 2023-06-30 ENCOUNTER — Other Ambulatory Visit (HOSPITAL_COMMUNITY): Payer: Self-pay

## 2023-07-02 ENCOUNTER — Other Ambulatory Visit: Payer: Self-pay

## 2023-07-02 ENCOUNTER — Other Ambulatory Visit (HOSPITAL_COMMUNITY): Payer: Self-pay

## 2023-07-02 MED ORDER — BENZONATATE 200 MG PO CAPS
200.0000 mg | ORAL_CAPSULE | Freq: Three times a day (TID) | ORAL | 0 refills | Status: AC
  Filled 2023-07-02: qty 20, 7d supply, fill #0

## 2023-07-16 NOTE — Progress Notes (Unsigned)
 [facility] [address] [place] [address] Phone: [phone] Subjective:   Peggye Bowers am a scribe for Dr. Felipe Horton.   I'm seeing this patient by the request  of:  Aldine Humphreys, PA  CC: Back and neck pain follow-up  [id number]  EHAN FREAS is a 33 y.o. male coming in with complaint of back and neck pain. OMT 06/27/2023. Patient states in the normal amount of pain, out of place, and sore.   Medications patient has been prescribed: None  Taking:         Reviewed prior external information including notes and imaging from previsou exam, outside providers and external EMR if available.   As well as notes that were available from care everywhere and other healthcare systems.  Past medical history, social, surgical and family history all reviewed in electronic medical record.  No pertanent information unless stated regarding to the chief complaint.   Past Medical History:  Diagnosis Date   ADD (attention deficit disorder)    Anxiety    Narcolepsy through school    Allergies  Allergen Reactions   Vueway  [Gadopiclenol ] Nausea Only     Review of Systems:  No headache, visual changes, nausea, vomiting, diarrhea, constipation, dizziness, abdominal pain, skin rash, fevers, chills, night sweats, weight loss, swollen lymph nodes, body aches, joint swelling, chest pain, shortness of breath, mood changes. POSITIVE muscle aches  Objective  Blood pressure 124/70, pulse 88, height 6' (1.829 m), weight 291 lb (132 kg), SpO2 98%.   General: No apparent distress alert and oriented x3 mood and affect normal, dressed appropriately.  HEENT: Pupils equal, extraocular movements intact  Respiratory: Patient's speak in full sentences and does not appear short of breath  Cardiovascular: No lower extremity edema, non tender, no erythema  Gait MSK:  Back significant arthritic changes still noted with significant tightness noted in the  paraspinal musculature on the right side.  Pain still seems to be out of proportion.  Still working on core strength.  It was day  Osteopathic findings  C2 flexed rotated and side bent right C7 flexed rotated and side bent left T3 extended rotated and side bent right inhaled rib T7 extended rotated and side bent left L2 flexed rotated and side bent right Sacrum right on right  Skin exam does show some plaque like aspect noted on the extensor surface of the right knee.     Assessment and Plan:  Kyphosis of thoracic region Continues some limitation in some of the range of motion.  Discussed different medications and likely patient is making some progress with some weight loss that I think will be significantly beneficial.  Discussed with patient that icing regimen and home exercises, discussed which activities to do and which ones to avoid.  Increase activity slowly.  Discussed icing regimen.  Follow-up again in 6 to 8 weeks.  Degeneration of intervertebral disc of thoracic region Has responded to epidurals previously and the increasing discomfort will get another injection to see if it will be beneficial.  I discussed with patient young age that we should consider more with other medications.  Patient feels like this gets him the best relief and is hoping if he can be more active will make some difference.  Right peroneal tendonosis New problem, brace given, discussed strengthening,  Scheuermann's disease Toradol  and Depo-Medrol  injections given today.    Nonallopathic problems  Decision today to treat with OMT was based on Physical Exam  After verbal consent  patient was treated with HVLA, ME, FPR techniques in cervical, rib, thoracic, lumbar, and sacral  areas  Patient tolerated the procedure well with improvement in symptoms  Patient given exercises, stretches and lifestyle modifications  See medications in patient instructions if given  Patient will follow up in 4-8  weeks    The above documentation has been reviewed and is accurate and complete Isidro Margo, DO          Note: This dictation was prepared with Dragon dictation along with smaller phrase technology. Any transcriptional errors that result from this process are unintentional.

## 2023-07-17 ENCOUNTER — Other Ambulatory Visit (HOSPITAL_COMMUNITY): Payer: Self-pay

## 2023-07-17 ENCOUNTER — Other Ambulatory Visit: Payer: Self-pay

## 2023-07-17 ENCOUNTER — Other Ambulatory Visit (HOSPITAL_BASED_OUTPATIENT_CLINIC_OR_DEPARTMENT_OTHER): Payer: Self-pay

## 2023-07-17 MED ORDER — OXYCODONE HCL 10 MG PO TABS
10.0000 mg | ORAL_TABLET | Freq: Three times a day (TID) | ORAL | 0 refills | Status: DC | PRN
  Filled 2023-07-17 – 2023-07-31 (×2): qty 90, 30d supply, fill #0

## 2023-07-17 MED ORDER — AMPHETAMINE-DEXTROAMPHETAMINE 30 MG PO TABS
30.0000 mg | ORAL_TABLET | Freq: Two times a day (BID) | ORAL | 0 refills | Status: DC
  Filled 2023-07-17: qty 75, 38d supply, fill #0
  Filled 2023-07-19: qty 75, 30d supply, fill #0

## 2023-07-17 MED ORDER — CLOMIPHENE CITRATE 50 MG PO TABS
25.0000 mg | ORAL_TABLET | ORAL | 2 refills | Status: AC
  Filled 2023-07-17: qty 20, 80d supply, fill #0
  Filled 2023-10-11 – 2023-10-16 (×2): qty 20, 80d supply, fill #1
  Filled 2024-01-01: qty 20, 80d supply, fill #2

## 2023-07-17 MED ORDER — ALBUTEROL SULFATE HFA 108 (90 BASE) MCG/ACT IN AERS
2.0000 | INHALATION_SPRAY | RESPIRATORY_TRACT | 1 refills | Status: DC | PRN
  Filled 2023-07-17 – 2023-07-18 (×2): qty 6.7, 25d supply, fill #0
  Filled 2023-10-11 – 2023-10-16 (×2): qty 6.7, 25d supply, fill #1

## 2023-07-17 MED ORDER — FLUTICASONE-SALMETEROL 115-21 MCG/ACT IN AERO
INHALATION_SPRAY | RESPIRATORY_TRACT | 12 refills | Status: AC
  Filled 2023-07-17 – 2023-08-06 (×2): qty 12, 30d supply, fill #0
  Filled 2023-09-14 – 2023-10-09 (×2): qty 12, 30d supply, fill #1
  Filled 2023-10-11 – 2023-11-01 (×2): qty 12, 30d supply, fill #2
  Filled 2023-12-04 – 2024-01-01 (×2): qty 12, 30d supply, fill #3
  Filled 2024-03-06: qty 12, 30d supply, fill #4

## 2023-07-18 ENCOUNTER — Other Ambulatory Visit (HOSPITAL_COMMUNITY): Payer: Self-pay

## 2023-07-18 ENCOUNTER — Encounter: Payer: Self-pay | Admitting: Family Medicine

## 2023-07-18 ENCOUNTER — Ambulatory Visit (INDEPENDENT_AMBULATORY_CARE_PROVIDER_SITE_OTHER): Payer: BC Managed Care – PPO | Admitting: Family Medicine

## 2023-07-18 ENCOUNTER — Other Ambulatory Visit: Payer: Self-pay | Admitting: Family Medicine

## 2023-07-18 ENCOUNTER — Other Ambulatory Visit (HOSPITAL_BASED_OUTPATIENT_CLINIC_OR_DEPARTMENT_OTHER): Payer: Self-pay

## 2023-07-18 VITALS — BP 124/70 | HR 88 | Ht 72.0 in | Wt 291.0 lb

## 2023-07-18 DIAGNOSIS — M5134 Other intervertebral disc degeneration, thoracic region: Secondary | ICD-10-CM | POA: Diagnosis not present

## 2023-07-18 DIAGNOSIS — M4200 Juvenile osteochondrosis of spine, site unspecified: Secondary | ICD-10-CM

## 2023-07-18 DIAGNOSIS — M542 Cervicalgia: Secondary | ICD-10-CM

## 2023-07-18 DIAGNOSIS — M6788 Other specified disorders of synovium and tendon, other site: Secondary | ICD-10-CM

## 2023-07-18 DIAGNOSIS — M9904 Segmental and somatic dysfunction of sacral region: Secondary | ICD-10-CM

## 2023-07-18 DIAGNOSIS — M9908 Segmental and somatic dysfunction of rib cage: Secondary | ICD-10-CM

## 2023-07-18 DIAGNOSIS — M9901 Segmental and somatic dysfunction of cervical region: Secondary | ICD-10-CM | POA: Diagnosis not present

## 2023-07-18 DIAGNOSIS — M9903 Segmental and somatic dysfunction of lumbar region: Secondary | ICD-10-CM

## 2023-07-18 DIAGNOSIS — M40204 Unspecified kyphosis, thoracic region: Secondary | ICD-10-CM | POA: Diagnosis not present

## 2023-07-18 DIAGNOSIS — M545 Low back pain, unspecified: Secondary | ICD-10-CM | POA: Diagnosis not present

## 2023-07-18 DIAGNOSIS — M9902 Segmental and somatic dysfunction of thoracic region: Secondary | ICD-10-CM

## 2023-07-18 DIAGNOSIS — R21 Rash and other nonspecific skin eruption: Secondary | ICD-10-CM | POA: Insufficient documentation

## 2023-07-18 DIAGNOSIS — M9905 Segmental and somatic dysfunction of pelvic region: Secondary | ICD-10-CM

## 2023-07-18 DIAGNOSIS — L409 Psoriasis, unspecified: Secondary | ICD-10-CM

## 2023-07-18 MED ORDER — METHYLPREDNISOLONE ACETATE 80 MG/ML IJ SUSP
80.0000 mg | Freq: Once | INTRAMUSCULAR | Status: AC
  Administered 2023-07-18: 80 mg via INTRAMUSCULAR

## 2023-07-18 MED ORDER — KETOROLAC TROMETHAMINE 60 MG/2ML IM SOLN
60.0000 mg | Freq: Once | INTRAMUSCULAR | Status: AC
  Administered 2023-07-18: 60 mg via INTRAMUSCULAR

## 2023-07-18 NOTE — Patient Instructions (Addendum)
 Ankle brace. Full cocktail. Referral to dermatology. Follow up in 4 to 6 weeks.

## 2023-07-18 NOTE — Assessment & Plan Note (Signed)
 Toradol  and Depo-Medrol  injections given today.

## 2023-07-18 NOTE — Assessment & Plan Note (Signed)
 Continues some limitation in some of the range of motion.  Discussed different medications and likely patient is making some progress with some weight loss that I think will be significantly beneficial.  Discussed with patient that icing regimen and home exercises, discussed which activities to do and which ones to avoid.  Increase activity slowly.  Discussed icing regimen.  Follow-up again in 6 to 8 weeks.

## 2023-07-18 NOTE — Assessment & Plan Note (Signed)
 On the right knee, has some consistency with psoriasis.  Will send to dermatology for further evaluation and potentially biopsy

## 2023-07-18 NOTE — Assessment & Plan Note (Addendum)
 Has responded to epidurals previously and the increasing discomfort will get another injection to see if it will be beneficial.  I discussed with patient young age that we should consider more with other medications.  Patient feels like this gets him the best relief and is hoping if he can be more active will make some difference.

## 2023-07-18 NOTE — Assessment & Plan Note (Signed)
 New problem, brace given, discussed strengthening,

## 2023-07-19 ENCOUNTER — Other Ambulatory Visit: Payer: Self-pay

## 2023-07-19 ENCOUNTER — Other Ambulatory Visit (HOSPITAL_COMMUNITY): Payer: Self-pay

## 2023-07-30 NOTE — Discharge Instructions (Signed)

## 2023-07-31 ENCOUNTER — Ambulatory Visit
Admission: RE | Admit: 2023-07-31 | Discharge: 2023-07-31 | Disposition: A | Discharge status: Home / Self Care | Source: Ambulatory Visit | Attending: Family Medicine | Admitting: Family Medicine

## 2023-07-31 ENCOUNTER — Other Ambulatory Visit (HOSPITAL_COMMUNITY): Payer: Self-pay

## 2023-07-31 DIAGNOSIS — M9902 Segmental and somatic dysfunction of thoracic region: Secondary | ICD-10-CM

## 2023-07-31 DIAGNOSIS — M5134 Other intervertebral disc degeneration, thoracic region: Secondary | ICD-10-CM

## 2023-07-31 MED ORDER — IOPAMIDOL (ISOVUE-M 300) INJECTION 61%
1.0000 mL | Freq: Once | INTRAMUSCULAR | Status: AC | PRN
  Administered 2023-07-31: 1 mL via EPIDURAL

## 2023-07-31 MED ORDER — TRIAMCINOLONE ACETONIDE 40 MG/ML IJ SUSP (RADIOLOGY)
60.0000 mg | Freq: Once | INTRAMUSCULAR | Status: AC
Start: 2023-07-31 — End: 2023-07-31
  Administered 2023-07-31: 60 mg via EPIDURAL

## 2023-08-06 ENCOUNTER — Other Ambulatory Visit (HOSPITAL_COMMUNITY): Payer: Self-pay

## 2023-08-07 ENCOUNTER — Other Ambulatory Visit: Payer: Self-pay

## 2023-08-07 ENCOUNTER — Other Ambulatory Visit (HOSPITAL_COMMUNITY): Payer: Self-pay

## 2023-08-08 ENCOUNTER — Ambulatory Visit: Admitting: Family Medicine

## 2023-08-10 NOTE — Progress Notes (Unsigned)
 [name] [address] [place] [address] Phone: [phone] Subjective:   Frank [name], am serving as a scribe for Dr. Arthea [name].  I'm seeing this patient by the request  of:  Frank Che, PA  CC: Mood and back pain  [name]  Frank [name] is a 33 y.o. male coming in with complaint of back and neck pain. OMT 07/18/2023.  Patient states doing about the same. In pain today.  Patient states that the epidural did help initially but unfortunately now has pain right along his spine.  Not midline.  Can go from the right to the left.  States more localized there was no radiation.  Continuing to work hard to lose weight.  Patient is down a total of 30 pounds at the moment.  Has had some constipation to some of the weight loss medication.  Medications patient has been prescribed: None  Taking:         Reviewed prior external information including notes and imaging from previsou exam, outside providers and external EMR if available.   As well as notes that were available from care everywhere and other healthcare systems.  Past medical history, social, surgical and family history all reviewed in electronic medical record.  No pertanent information unless stated regarding to the chief complaint.   Past Medical History:  Diagnosis Date   ADD (attention deficit disorder)    Anxiety    Narcolepsy through school    Allergies  Allergen Reactions   Vueway  [Gadopiclenol ] Nausea Only     Review of Systems:  No headache, visual changes, nausea, vomiting, diarrhea,  dizziness, abdominal pain, skin rash, fevers, chills, night sweats, weight loss, swollen lymph nodes, body aches, joint swelling, chest pain, shortness of breath, mood changes. POSITIVE muscle aches, constipation  Objective  Blood pressure 110/82, pulse 80, height 6' (1.829 m), weight 273 lb (123.8 kg), SpO2 99%.   General: No apparent distress alert and oriented x3 mood  and affect normal, dressed appropriately.  HEENT: Pupils equal, extraocular movements intact  Respiratory: Patient's speak in full sentences and does not appear short of breath  Cardiovascular: No lower extremity edema, non tender, no erythema  MSK:  Back does have some loss lordosis noted.  Some tenderness to palpation of the paraspinal musculature.  Osteopathic findings  C2 flexed rotated and side bent right C6 flexed rotated and side bent left T3 extended rotated and side bent right inhaled rib T9 extended rotated and side bent left L2 flexed rotated and side bent right L3 flexed rotated and side bent left Sacrum right on right     Assessment and Plan:  Kyphosis of thoracic region Continue to have discomfort and pain.  Discussed icing regimen and home exercises.  Discussed patient's pain could be secondary to some mild bruising from the epidural recently.  Should do relatively well though I would think.  Toradol  and Depo-Medrol  injections given today.  Discussed icing regimen.  Follow-up again in 6 to 8 weeks    Nonallopathic problems  Decision today to treat with OMT was based on Physical Exam  After verbal consent patient was treated with HVLA, ME, FPR techniques in cervical, rib, thoracic, lumbar, and sacral  areas  Patient tolerated the procedure well with improvement in symptoms  Patient given exercises, stretches and lifestyle modifications  See medications in patient instructions if given  Patient will follow up in 4-8 weeks     The above documentation has been reviewed and  is accurate and complete Frank CHRISTELLA Sharps, DO         Note: This dictation was prepared with Dragon dictation along with smaller phrase technology. Any transcriptional errors that result from this process are unintentional.

## 2023-08-16 ENCOUNTER — Ambulatory Visit: Admitting: Family Medicine

## 2023-08-16 ENCOUNTER — Ambulatory Visit (INDEPENDENT_AMBULATORY_CARE_PROVIDER_SITE_OTHER)

## 2023-08-16 ENCOUNTER — Other Ambulatory Visit (HOSPITAL_COMMUNITY): Payer: Self-pay

## 2023-08-16 ENCOUNTER — Encounter: Payer: Self-pay | Admitting: Family Medicine

## 2023-08-16 VITALS — BP 110/82 | HR 80 | Ht 72.0 in | Wt 273.0 lb

## 2023-08-16 DIAGNOSIS — M9901 Segmental and somatic dysfunction of cervical region: Secondary | ICD-10-CM | POA: Diagnosis not present

## 2023-08-16 DIAGNOSIS — R109 Unspecified abdominal pain: Secondary | ICD-10-CM

## 2023-08-16 DIAGNOSIS — M9904 Segmental and somatic dysfunction of sacral region: Secondary | ICD-10-CM | POA: Diagnosis not present

## 2023-08-16 DIAGNOSIS — M40204 Unspecified kyphosis, thoracic region: Secondary | ICD-10-CM | POA: Diagnosis not present

## 2023-08-16 DIAGNOSIS — M9903 Segmental and somatic dysfunction of lumbar region: Secondary | ICD-10-CM

## 2023-08-16 DIAGNOSIS — M7918 Myalgia, other site: Secondary | ICD-10-CM

## 2023-08-16 DIAGNOSIS — M2550 Pain in unspecified joint: Secondary | ICD-10-CM | POA: Diagnosis not present

## 2023-08-16 DIAGNOSIS — M9902 Segmental and somatic dysfunction of thoracic region: Secondary | ICD-10-CM

## 2023-08-16 DIAGNOSIS — M9908 Segmental and somatic dysfunction of rib cage: Secondary | ICD-10-CM | POA: Diagnosis not present

## 2023-08-16 MED ORDER — AMPHETAMINE-DEXTROAMPHETAMINE 30 MG PO TABS
30.0000 mg | ORAL_TABLET | Freq: Two times a day (BID) | ORAL | 0 refills | Status: DC
  Filled 2023-08-16: qty 75, 30d supply, fill #0

## 2023-08-16 MED ORDER — METHYLPREDNISOLONE ACETATE 80 MG/ML IJ SUSP
80.0000 mg | Freq: Once | INTRAMUSCULAR | Status: AC
  Administered 2023-08-16: 80 mg via INTRAMUSCULAR

## 2023-08-16 MED ORDER — METHOCARBAMOL 500 MG PO TABS
500.0000 mg | ORAL_TABLET | Freq: Four times a day (QID) | ORAL | 1 refills | Status: DC
  Filled 2023-08-16: qty 120, 30d supply, fill #0
  Filled 2023-09-03 – 2023-09-14 (×2): qty 120, 30d supply, fill #1

## 2023-08-16 MED ORDER — OXYCODONE HCL 10 MG PO TABS
10.0000 mg | ORAL_TABLET | Freq: Three times a day (TID) | ORAL | 0 refills | Status: DC | PRN
  Filled 2023-08-16 – 2023-08-30 (×2): qty 90, 30d supply, fill #0

## 2023-08-16 MED ORDER — KETOROLAC TROMETHAMINE 60 MG/2ML IM SOLN
60.0000 mg | Freq: Once | INTRAMUSCULAR | Status: AC
  Administered 2023-08-16: 60 mg via INTRAMUSCULAR

## 2023-08-16 MED ORDER — BENZONATATE 200 MG PO CAPS
200.0000 mg | ORAL_CAPSULE | ORAL | 0 refills | Status: DC | PRN
  Filled 2023-08-16: qty 20, 20d supply, fill #0

## 2023-08-16 NOTE — Assessment & Plan Note (Signed)
 Pain is out of proportion and with some abdominal pain noted.  Constipation get some lab to rule out any type of pancreatitis as well as KUB to make sure no significant constipation.

## 2023-08-16 NOTE — Assessment & Plan Note (Signed)
 Continue to have discomfort and pain.  Discussed icing regimen and home exercises.  Discussed patient's pain could be secondary to some mild bruising from the epidural recently.  Should do relatively well though I would think.  Toradol  and Depo-Medrol  injections given today.  Discussed icing regimen.  Follow-up again in 6 to 8 weeks

## 2023-08-16 NOTE — Patient Instructions (Signed)
 Labs today Cocktail injection

## 2023-08-18 ENCOUNTER — Other Ambulatory Visit (HOSPITAL_COMMUNITY): Payer: Self-pay

## 2023-08-19 ENCOUNTER — Ambulatory Visit: Payer: Self-pay | Admitting: Family Medicine

## 2023-08-20 ENCOUNTER — Telehealth: Payer: Self-pay | Admitting: Family Medicine

## 2023-08-20 ENCOUNTER — Ambulatory Visit: Admitting: Family Medicine

## 2023-08-20 ENCOUNTER — Other Ambulatory Visit (INDEPENDENT_AMBULATORY_CARE_PROVIDER_SITE_OTHER)

## 2023-08-20 DIAGNOSIS — M2550 Pain in unspecified joint: Secondary | ICD-10-CM

## 2023-08-20 LAB — COMPREHENSIVE METABOLIC PANEL WITH GFR
ALT: 39 U/L (ref 0–53)
AST: 28 U/L (ref 0–37)
Albumin: 4.5 g/dL (ref 3.5–5.2)
Alkaline Phosphatase: 44 U/L (ref 39–117)
BUN: 18 mg/dL (ref 6–23)
CO2: 27 meq/L (ref 19–32)
Calcium: 9.4 mg/dL (ref 8.4–10.5)
Chloride: 104 meq/L (ref 96–112)
Creatinine, Ser: 1.3 mg/dL (ref 0.40–1.50)
GFR: 72.77 mL/min (ref >60.00)
Glucose, Bld: 88 mg/dL (ref 70–99)
Potassium: 3.9 meq/L (ref 3.5–5.1)
Sodium: 139 meq/L (ref 135–145)
Total Bilirubin: 1 mg/dL (ref 0.2–1.2)
Total Protein: 7.2 g/dL (ref 6.0–8.3)

## 2023-08-20 LAB — URIC ACID: Uric Acid, Serum: 5.9 mg/dL (ref 4.0–7.8)

## 2023-08-20 LAB — SEDIMENTATION RATE: Sed Rate: 8 mm/h (ref 0–15)

## 2023-08-20 LAB — LIPASE: Lipase: 77 U/L — ABNORMAL HIGH (ref 11.0–59.0)

## 2023-08-20 NOTE — Telephone Encounter (Signed)
 Pt came in today for labs. Wanted following msg sent: Pt has been unable to stand since the last visit, wonders if it is time for trigger point inj. Has next appt 7/28, also wonders if he needs to be seen before then.  I did NOT add pt to the wait list.

## 2023-08-21 LAB — ANA+ENA+DNA/DS+SCL 70+SJOSSA/B
ANA Titer 1: NEGATIVE
ENA RNP Ab: 0.2 AI (ref 0.0–0.9)
ENA SM Ab Ser-aCnc: <0.2 AI (ref 0.0–0.9)
ENA SSA (RO) Ab: <0.2 AI (ref 0.0–0.9)
ENA SSB (LA) Ab: <0.2 AI (ref 0.0–0.9)
Scleroderma (Scl-70) (ENA) Antibody, IgG: <0.2 AI (ref 0.0–0.9)
dsDNA Ab: <1 [IU]/mL (ref 0–9)

## 2023-08-30 ENCOUNTER — Other Ambulatory Visit (HOSPITAL_COMMUNITY): Payer: Self-pay

## 2023-09-03 ENCOUNTER — Other Ambulatory Visit: Payer: Self-pay

## 2023-09-03 ENCOUNTER — Other Ambulatory Visit (HOSPITAL_COMMUNITY): Payer: Self-pay

## 2023-09-03 MED ORDER — ZEPBOUND 10 MG/0.5ML ~~LOC~~ SOAJ
10.0000 mg | SUBCUTANEOUS | 2 refills | Status: AC
  Filled 2023-09-03: qty 2, 28d supply, fill #0

## 2023-09-06 ENCOUNTER — Other Ambulatory Visit (HOSPITAL_COMMUNITY): Payer: Self-pay

## 2023-09-06 NOTE — Progress Notes (Signed)
 [name] [address] [place] [address] Phone: [phone] Subjective:   Frank [name], am serving as a scribe for Dr. Arthea Claudene.  I'm seeing this patient by the request  of:  Juliane Che, PA  CC: Low back pain follow-up  [name]  Frank [name] is a 33 y.o. male coming in with complaint of back and neck pain. OMT 07/18/2023.  Had some elevation of lipase level on labs.  Patient states lots of pain in the back.  Medications patient has been prescribed: None  Taking:       Reviewed prior external information including notes and imaging from previsou exam, outside providers and external EMR if available.   As well as notes that were available from care everywhere and other healthcare systems.  Past medical history, social, surgical and family history all reviewed in electronic medical record.  No pertanent information unless stated regarding to the chief complaint.   Past Medical History:  Diagnosis Date   ADD (attention deficit disorder)    Anxiety    Narcolepsy through school    Allergies  Allergen Reactions   Vueway  [Gadopiclenol ] Nausea Only     Review of Systems:  No headache, visual changes, nausea, vomiting, diarrhea, constipation, dizziness, abdominal pain, skin rash, fevers, chills, night sweats, weight loss, swollen lymph nodes, body aches, joint swelling, chest pain, shortness of breath, mood changes. POSITIVE muscle aches  Objective  Blood pressure 122/76, pulse 92, height 6' (1.829 m), weight 263 lb (119.3 kg), SpO2 98%.   General: No apparent distress alert and oriented x3 mood and affect normal, dressed appropriately.  HEENT: Pupils equal, extraocular movements intact  Respiratory: Patient's speak in full sentences and does not appear short of breath  Cardiovascular: No lower extremity edema, non tender, no erythema  Gait MSK:  Back continues to have severe tightness noted.  Affecting daily  activities it appears even getting up and down from a sitting position at the moment.  Osteopathic findings  C2 flexed rotated and side bent right C6 flexed rotated and side bent left T3 extended rotated and side bent right inhaled rib T9 extended rotated and side bent left L2 flexed rotated and side bent right L3 flexed rotated and side bent left Sacrum right on right    Assessment and Plan:  Scheuermann's disease Continues to have chronic pain.  That is attempting to lose weight.  Seems to be on a calorie restriction that I think is exacerbating some of the muscle soreness and pain.  Discussed with patient that this regimen exercises.  Discussed which activities to do with joints to avoid.  Depo-Medrol  given today.  I discussed the medications he continues to take.  Follow-up again in 8 weeks    Nonallopathic problems  Decision today to treat with OMT was based on Physical Exam  After verbal consent patient was treated with HVLA, ME, FPR techniques in cervical, rib, thoracic, lumbar, and sacral  areas  Patient tolerated the procedure well with improvement in symptoms  Patient given exercises, stretches and lifestyle modifications  See medications in patient instructions if given  Patient will follow up in 4-8 weeks     The above documentation has been reviewed and is accurate and complete [name] M [name], DO         Note: This dictation was prepared with Dragon dictation along with smaller phrase technology. Any transcriptional errors that result from this process are unintentional.

## 2023-09-10 ENCOUNTER — Encounter: Payer: Self-pay | Admitting: Family Medicine

## 2023-09-10 ENCOUNTER — Ambulatory Visit (INDEPENDENT_AMBULATORY_CARE_PROVIDER_SITE_OTHER): Admitting: Family Medicine

## 2023-09-10 VITALS — BP 122/76 | HR 92 | Ht 72.0 in | Wt 263.0 lb

## 2023-09-10 DIAGNOSIS — M545 Low back pain, unspecified: Secondary | ICD-10-CM

## 2023-09-10 DIAGNOSIS — M9904 Segmental and somatic dysfunction of sacral region: Secondary | ICD-10-CM | POA: Diagnosis not present

## 2023-09-10 DIAGNOSIS — M4200 Juvenile osteochondrosis of spine, site unspecified: Secondary | ICD-10-CM | POA: Diagnosis not present

## 2023-09-10 DIAGNOSIS — M542 Cervicalgia: Secondary | ICD-10-CM

## 2023-09-10 DIAGNOSIS — M9901 Segmental and somatic dysfunction of cervical region: Secondary | ICD-10-CM | POA: Diagnosis not present

## 2023-09-10 DIAGNOSIS — M9903 Segmental and somatic dysfunction of lumbar region: Secondary | ICD-10-CM | POA: Diagnosis not present

## 2023-09-10 DIAGNOSIS — M9908 Segmental and somatic dysfunction of rib cage: Secondary | ICD-10-CM

## 2023-09-10 DIAGNOSIS — M9902 Segmental and somatic dysfunction of thoracic region: Secondary | ICD-10-CM

## 2023-09-10 MED ORDER — METHYLPREDNISOLONE ACETATE 80 MG/ML IJ SUSP
80.0000 mg | Freq: Once | INTRAMUSCULAR | Status: AC
  Administered 2023-09-10: 80 mg via INTRAMUSCULAR

## 2023-09-10 MED ORDER — KETOROLAC TROMETHAMINE 60 MG/2ML IM SOLN
60.0000 mg | Freq: Once | INTRAMUSCULAR | Status: AC
  Administered 2023-09-10: 60 mg via INTRAMUSCULAR

## 2023-09-10 NOTE — Assessment & Plan Note (Signed)
 Continues to have chronic pain.  That is attempting to lose weight.  Seems to be on a calorie restriction that I think is exacerbating some of the muscle soreness and pain.  Discussed with patient that this regimen exercises.  Discussed which activities to do with joints to avoid.  Depo-Medrol  given today.  I discussed the medications he continues to take.  Follow-up again in 8 weeks

## 2023-09-10 NOTE — Patient Instructions (Addendum)
 Goal of 100g of protein Full cocktail today. Whey protein isolate Seek medical attention if stomach worsens Labs next visit

## 2023-09-13 ENCOUNTER — Other Ambulatory Visit (HOSPITAL_COMMUNITY): Payer: Self-pay

## 2023-09-13 MED ORDER — AMOXICILLIN-POT CLAVULANATE 875-125 MG PO TABS
1.0000 | ORAL_TABLET | Freq: Two times a day (BID) | ORAL | 0 refills | Status: AC
  Filled 2023-09-13: qty 28, 14d supply, fill #0

## 2023-09-13 MED ORDER — FLUTICASONE PROPIONATE 50 MCG/ACT NA SUSP
2.0000 | Freq: Every day | NASAL | 11 refills | Status: AC
  Filled 2023-09-13 – 2023-09-14 (×2): qty 16, 30d supply, fill #0
  Filled 2023-10-11 – 2023-10-16 (×2): qty 16, 30d supply, fill #1
  Filled 2023-11-11: qty 16, 30d supply, fill #2

## 2023-09-13 MED ORDER — AZELASTINE HCL 137 MCG/SPRAY NA SOLN
1.0000 | Freq: Two times a day (BID) | NASAL | 11 refills | Status: AC
  Filled 2023-09-13: qty 30, 30d supply, fill #0
  Filled 2023-10-11 – 2023-10-16 (×2): qty 30, 30d supply, fill #1
  Filled 2023-11-11: qty 30, 30d supply, fill #2
  Filled 2024-01-01: qty 30, 30d supply, fill #3

## 2023-09-13 MED ORDER — PREDNISONE 10 MG PO TABS
ORAL_TABLET | ORAL | 0 refills | Status: DC
  Filled 2023-09-13: qty 24, 12d supply, fill #0

## 2023-09-14 ENCOUNTER — Other Ambulatory Visit (HOSPITAL_COMMUNITY): Payer: Self-pay

## 2023-09-14 ENCOUNTER — Other Ambulatory Visit: Payer: Self-pay

## 2023-09-14 MED ORDER — BENZONATATE 200 MG PO CAPS
200.0000 mg | ORAL_CAPSULE | Freq: Every day | ORAL | 0 refills | Status: DC | PRN
  Filled 2023-09-14: qty 20, 20d supply, fill #0

## 2023-09-14 MED ORDER — OXYCODONE HCL 10 MG PO TABS
10.0000 mg | ORAL_TABLET | Freq: Three times a day (TID) | ORAL | 0 refills | Status: DC | PRN
  Filled 2023-09-14 – 2023-10-01 (×2): qty 90, 30d supply, fill #0

## 2023-09-14 MED ORDER — AMPHETAMINE-DEXTROAMPHETAMINE 30 MG PO TABS
30.0000 mg | ORAL_TABLET | Freq: Two times a day (BID) | ORAL | 0 refills | Status: DC
  Filled 2023-09-14: qty 75, 30d supply, fill #0

## 2023-09-24 ENCOUNTER — Ambulatory Visit: Admitting: Primary Care

## 2023-10-01 ENCOUNTER — Ambulatory Visit (INDEPENDENT_AMBULATORY_CARE_PROVIDER_SITE_OTHER): Admitting: Primary Care

## 2023-10-01 ENCOUNTER — Other Ambulatory Visit (HOSPITAL_COMMUNITY): Payer: Self-pay

## 2023-10-01 ENCOUNTER — Encounter: Payer: Self-pay | Admitting: Primary Care

## 2023-10-01 VITALS — BP 136/74 | HR 74 | Temp 97.8°F | Ht 72.0 in | Wt 261.8 lb

## 2023-10-01 DIAGNOSIS — G4733 Obstructive sleep apnea (adult) (pediatric): Secondary | ICD-10-CM | POA: Diagnosis not present

## 2023-10-01 DIAGNOSIS — J329 Chronic sinusitis, unspecified: Secondary | ICD-10-CM | POA: Diagnosis not present

## 2023-10-01 DIAGNOSIS — J45909 Unspecified asthma, uncomplicated: Secondary | ICD-10-CM | POA: Diagnosis not present

## 2023-10-01 NOTE — Patient Instructions (Signed)
  VISIT SUMMARY: Today, you came in for a follow-up visit to discuss your asthma and obstructive sleep apnea. We reviewed your current symptoms, medications, and recent treatments. Your asthma has improved since your last visit, and we discussed your ongoing issues with sinus congestion and CPAP use.  YOUR PLAN: -ASTHMA: Asthma is a condition where your airways narrow and swell, making it difficult to breathe. Your symptoms have improved with your current medication, Advair , but you still experience occasional flare-ups. Continue using Advair  at the current dose and use your albuterol  inhaler as needed. If your symptoms worsen or if you have more than two sinus infections a year that make your asthma worse, we may consider adding a medication like Dupixent.  -OBSTRUCTIVE SLEEP APNEA: Obstructive sleep apnea is a condition where your breathing stops and starts during sleep due to blocked airways. You are using a CPAP machine to manage this. We will adjust the CPAP settings to make it more comfortable for you to exhale. You will also do an overnight pulse oximetry test to monitor your oxygen levels while using the CPAP. Please report any changes or issues with your CPAP settings through MyChart.  -RECURRENT SINUSITIS: Recurrent sinusitis is the repeated inflammation of your sinuses, causing congestion and breathing difficulties. You recently completed a course of antibiotics and steroids for this. You will have a CT scan with contrast to further evaluate your sinus condition. Discuss the results with your ENT specialist to determine the next steps. If chronic sinusitis is confirmed and continues to affect your asthma, we may consider adding Dupixent.  INSTRUCTIONS: Please complete the CT scan with contrast as ordered by your ENT specialist. Follow up with your ENT to discuss the results and determine further treatment options. Perform the overnight pulse oximetry test while using your CPAP and report any  changes or issues with your CPAP settings through MyChart.  Follow-up 6 months with Landry NP

## 2023-10-01 NOTE — Progress Notes (Signed)
 @Patient  ID: Frank [name], male    DOB: 09-21-1990, 33 y.o.   MRN: [id number]  Chief Complaint  Patient presents with   Obstructive Sleep Apnea    CPAP f/u     Referring provider: Juliane Che, PA  HPI: 33 year old male, never smoked. PMH significant for ADD, OCD, asthma, moderate OSA.   Previous [facility] pulmonary encounter:  05/04/2021 Patient presents today for sleep consult. He had sleep study in 2016 that showed mild OSA, AHI was 5.4. He was never started on treatment. He has symptoms of loud snoring, witness apnea, restless sleep and daytime sleepiness. He has a family hx sleep apnea. Wife states that his oxygen will drop into the 60s. Typical bedtime is between 8-11pm. He takes 50mg  trazodone  for insomnia at bedtime, he has prn Ambien  which he uses very infrequently. He wakes up multiple times at night, previously thought this was due to his chronic back pain. He starts his day at 4:30 or 6am. No morning grogginess but reports daytime sleepiness in the afternoon. Drinking a lot of caffeine to stay awake. He takes adderall 15mg  as needed mostly when having to drive, he does not use every day. He has been having palpitations and dizziness. Denies narcolepsy, cataplexy or sleep walking.   Sleep questionnaire: Symptoms- Loud snoring, waking up gasping for air, daytime sleepiness  Prior sleep study- Yes, early 63s (results available) Bedtime- 8-11pm Time to fall asleep- minutes to hours  Nocturnal awakenings- too many times  Out of bed/start of day- 4:30 or 6am  Weight changes- Fluctuates  Do you operate heavy machinery- No Do you currently wear CPAP-No  Do you current wear oxygen- No  Epworth - 17  06/22/2021 Patient contacted today for follow-up. Accompanied by wife on virtual video visit. He had a home sleep test on 06/03/21 that showed moderate obstructive sleep apnea, AHI 23.4/hr with SpO2 low 70% (average 90%). Reviewed treatment options. His daytime sleepiness has worsened, he  would like to try CPAP. He is no longer taking Ambien  to sleep, remains on trazodone  50mg  at bedtime. He is taking Adderall 15mg  TID as needed for ADD symptoms.  Moderate OSA: - Patient has symptoms of loud snoring, waking up gasping for air and daytime sleepiness. Home sleep study on 06/03/21 >> AHI 23.4/hr with SpO2 low 70%. Reviewed treatment options including weight loss, oral appliance, CPAP therapy or referral to ENT. Due to severe of symptoms and daytime sleepiness recommended starting CPAP therapy, patient is agreement with plan.  DME referral placed for new CPAP start auto titrate 5 to 15 cm H2O with mask of choice. Encourage patient aim to wear CPAP every night min 4-6 hours or longer. Advised against driving if experiencing excessive daytime sleepiness. FU in 8 weeks for compliance check.   Orders: Start CPAP 5-15cm h20 with mask of choice/chin strap    09/06/2021 Patient presents today for 8-week follow-up OSA.  Symptoms of loud snoring, waking up gasping for air and daytime sleepiness.  Home sleep study on 06/03/2021 showed moderate obstructive sleep apnea, AHI 23.4 an hour.  During last visit patient was started on CPAP.  Patient was started on CPAP and has been wearing consistently the last 30 days.  He reports significant benefit in sleep quality and energy level since using.  He is able to sleep through the night and is not as tired the next day.  He is getting good rest.  He is having less chronic pain symptoms and has been able to  decrease opioids.  He is feeling a lot better.  Not needing to drink as much caffeine.  Mind is clear.  He recently developed URI symptoms 2 weeks ago and has been having more difficulty wearing his CPAP due to nasal congestion.  He has an associated productive cough with yellow mucus and wheezing.  His 23-year-old has been sick on and off and plans on getting ear tubes with ENT in August.  Airview download 08/07/2021 - 09/05/2021 Usage days 30/30 days (100%); 26  days (87%): 4 hours Average usage 6 hours 6 minutes Pressure 5 to 15 cm H2O (13.4 cm H2O-95%) Airleaks 1.4L/min (95%) AHI 1.2   02/10/2022 Patient contacted today virtually for for 4-6 month follow-up. Recurrent bronchitis. Associated PND and wheezing. He gets most viral illness his son will get. Cough has been worse last 4 weeks ago. He had an ear infection at the same time. He saw ENT. He has been on 14 day course Augmentin  and two rounds of prednisone . He uses Astelin /Fluticasone  nasal spray at night. CPAP is working well. He feels more rested.   Airview download 12/10/21-02/07/22 60/60 days (100%); 52 days (87%) > 4 hours Average usage 6 hours 16 mins Pressure 8-18cm h20 (12.9cm h20-95%) Airleaks 16.8L/min AHI 2.4  OSA: - Patient is compliant with CPAP and reports benefit from use  - Pressure 8-18cm h20; Residual AHI 2.4 - No changes  - Continue to encourage patient wear CPAP nightly  Chronic sinusitis with recurrent bronchitis - Not acutely flared. Eos 300. Maintained on Astelin /fluticasone  nasal spray. Starting Singulair  10mg  at bedtime. Checking PFTs. Following with ENT.    05/12/2022 Patient presents today for 51-month follow-up.  He has sleep apnea and chronic sinusitis with recurrent bronchitis.  During her last office visit he was started on Singulair  10 mg at bedtime. Pulmonary function testing in January showed normal lung function, no evidence of obstructive lung disease. He is having issues with chronic sinusitis and hearing. Continue to follow with ear nose and throat. Associated chest tightness. Using Albuterol  more frequently than he'd like upwards of 5-6 times a day with temporary improvement. He is getting up mucus. CXR on 05/03/22 showed clear lungs.   He is doing well sleep wise. He is 97% compliant with CPAP use greater than 4 hours over the last 30 days.  Current pressure 8 to 18 cm H2O; residual AHI 1.3/hour. He takes adderrall 30mg  daily and 15mg  as needed.    Airview download 04/11/22-05/10/22 Usage days 29/30 days (97%) greater than 4 hours Average usage 6 hours 44 minutes Pressure 8 to 18 cm H2O (13.7 cm H2O-95%) Air leaks 22.8 L/min (95%) AHI 1.3  Moderate obstructive sleep apnea - HST 06/03/21 >> moderate obstructive sleep apnea, AHI 23.4/hr with SpO2 low 70% (average 90%). Patient reports clinical benefit in sleep quality and daytime fatigue. He is drinking less caffeine. Current CPAP pressure 8-18cm h20; Residual AHI 1.3/hour. No changes today.  Sinew to encourage patient aim to wear CPAP nightly for 4 to 6 hours or longer.  Encouraged weight loss efforts.  Follow-up in 6 months or sooner if needed.   Chronic sinusitis with recurrent bronchitis - Never smoked; Recurrent sinusitis - Continue Astelin /fluticasone  nasal spray; Singulair  10mg  at bedtime - Continue follow with ENT    Asthma - Patient has clincal symptoms of shortness of breath with exertion and chest tightness. Reports temporary improvement with SABA - PFTs were normal in January 2024 without evidence of obstructive airway disease, there was evidence of air trapping -  CXR in March 2024 showed clear lungs  - Recommend trial low dose ICS/LABA - Advair  45-21mcg two puffs q 12 hours  - FU in 4 months or sooner if needed   10/25/2022 Patient presents today for 6 month follow-up OSA and asthma. Home sleep study on 06/03/2021 showed moderate obstructive sleep apnea, AHI 23.4 an hour. Maintained on Auto CPAP. He had right tympanomastoidectomy and tube placement on July 30th with [facility] with Dr. Gilmer. His hearing is better. Still having's some drainage. He has some minor nasal congestion which he reports is not as bad as it was previously. He is able to use flonase  and Astelin  as needed. He was cleared to resume CPAP last week. Tolerating pressure alright. Sleeping well. He reports significant benefit in sleep quality with use, he was miserable being off CPAP post op.   His  breathing improved with ICS/LABA, he can see a difference when not using Not experiencing significant shortness of breath or chest tightness  Using Advair  45-20mcg two puffs twice daily  Rarely using SABA  Airview download 07/27/2022 - 10/24/2022 Usage 55/90 days (61%) > 4 hours Usage days used 7 hours 21 mins Pressure 8-18cm h20 (13.9cm h20) Airleaks 32.9L (95%) AHI 2.1   04/24/2023 Discussed the use of AI scribe software for clinical note transcription with the patient, who gave verbal consent to proceed.  History of Present Illness   The patient, with obstructive sleep apnea and asthma, presents for follow-up.  He is being followed for obstructive sleep apnea, managed with CPAP therapy. Compliance is at 93%, with an average usage of 6 hours and 22 minutes per night. The CPAP machine is set to auto-adjust between pressures of 8 to 18,. He notes occasional air leaks and mentions that he is due for a new mask. Significant differences in energy and alertness are noted when not using the CPAP, with feelings of grogginess and lack of energy, along with worsened chest and nasal symptoms.  He has a history of asthma, which was well-controlled as of September, with minimal use of his rescue inhaler, albuterol , and is on a low-dose Advair  regimen. Since January, he has experienced two significant respiratory illnesses. The first episode involved severe breathing difficulty, requiring nebulizer treatment and home use of a nebulizer for two weeks, associated with an ear infection, sinus infection, and chest infection, treated with prednisone , a nasal spray, and ear drops. The second episode, occurring about two to three weeks ago, was a severe flu, confirmed by a positive test, resulting in a 12-pound weight loss due to inability to eat or drink for three days. Post-flu, he reports persistent chest weakness and increased use of his rescue inhaler. He describes episodes of chest congestion and shortness of  breath, particularly in the mornings and randomly throughout the day. Nasal congestion and occasional waking at night feeling unable to breathe, leading to removal of his CPAP mask, are also reported.  He has been taking Singulair  (montelukast ), which he finds helpful, and occasionally uses ibuprofen. Symptoms have been slowly improving, with this week being better than the last, but he still experiences chest soreness and fluid sounds in his chest when exhaling. His asthma symptoms were well-managed in September, following surgery, but have since worsened with recent illnesses.    Airview download 03/24/2023 - 04/22/2023 Usage days 28/30 Average usage 6 hours 22 mins Pressure 8-18cm h20 Airleaks 32 L/min (95%) AHI 2.3     10/01/2023- Interim hx  Discussed the use of AI scribe  software for clinical note transcription with the patient, who gave verbal consent to proceed.  History of Present Illness Frank [name] is a 33 year old male with asthma and obstructive sleep apnea who presents for follow-up of his respiratory conditions. He is under the care of an ENT specialist, Dr. Gilmer, and has been referred for a CT scan with contrast to further evaluate his sinus issues.  His asthma symptoms have improved significantly since his last visit in March, following an increase in his Advair  dose to 115 mcg. He experiences occasional flare-ups, often triggered by inhaling dust or having a dry mouth, which sometimes require the use of his albuterol  inhaler. These episodes are described as a 'wheezing cough' where he struggles to catch his breath. He reports experiencing chest tightness in the morning upon waking, which typically goes away within a few minutes, sometimes after using his inhaler.  He recently completed a course of amoxicillin  and prednisone  for recurrent ear infections and sinus issues, which have been causing congestion and difficulty breathing, especially when lying down. This congestion  contributes to his morning chest tightness and difficulty breathing on CPAP, as he often has only one nostril working. He is currently on Singulair  10 mg at bedtime, which he finds helpful, and uses Lasix intermittently for fluid buildup related to previous surgery for cerebral spinal fluid issues.  He has lost 50 pounds this year through dieting and exercise, which he feels has improved his breathing. He is not currently on Zepbound  due to cost issues. He uses Trazodone  50-100 mg at bedtime for sleep and reports that he does not need to use his albuterol  inhaler daily anymore, only during episodes of gasping or significant congestion.  Airview download 09/01/23-09/30/23 Usage days 30/30 days Average usage 6 hours 34 mins Pressure 8-18cm h20 (10.5cm h20-95%) Airleaks 31L/min (95%) AHI 0.8   Allergies  Allergen Reactions   Vueway  [Gadopiclenol ] Nausea Only    Immunization History  Administered Date(s) Administered   Influenza Inj Mdck Quad Pf 12/16/2021   Influenza,inj,Quad PF,6+ Mos 01/17/2016, 10/13/2016   Influenza,inj,quad, With Preservative 12/03/2017   Meningococcal polysaccharide vaccine (MPSV4) 02/13/2009   PFIZER(Purple Top)SARS-COV-2 Vaccination 04/15/2019, 05/16/2019, 01/03/2020   Pfizer(Comirnaty)Fall Seasonal Vaccine 12 years and older 12/16/2021   Tdap 02/13/2009, 12/21/2017    Past Medical History:  Diagnosis Date   ADD (attention deficit disorder)    Anxiety    Narcolepsy through school    Tobacco History: Social History   Tobacco Use  Smoking Status Never  Smokeless Tobacco Never   Counseling given: Not Answered   Outpatient Medications Prior to Visit  Medication Sig Dispense Refill   albuterol  (PROVENTIL ) (2.5 MG/3ML) 0.083% nebulizer solution Inhale 2 vials ( 5 mg) by nebulization 4 times a day. 180 mL 0   albuterol  (PROVENTIL ) (5 MG/ML) 0.5% nebulizer solution Use 2 vials (5mg ) by nebulization 4 times a day as needed for wheezing. 60 mL 0   albuterol   (VENTOLIN  HFA) 108 (90 Base) MCG/ACT inhaler Inhale 2 puffs into the lungs every 4 (four) hours as needed for wheezing or shortness of breath. 6.7 g 1   amphetamine -dextroamphetamine  (ADDERALL) 30 MG tablet Take 1 tablet by mouth 2 (two) times daily. May also take 1/2 tablet daily as needed 75 tablet 0   azelastine  (ASTELIN ) 0.1 % nasal spray Place 2 sprays into both nostrils 2 (two) times daily as needed (nasal symptoms). 30 mL 98   Azelastine  HCl 137 MCG/SPRAY SOLN Place 2 sprays into each nostril 2 (two)  times daily as needed (nasal symptoms). 30 mL 99   Azelastine  HCl 137 MCG/SPRAY SOLN 1 or 2 sprays per nostril twice daily as needed 30 mL 11   benzonatate  (TESSALON ) 200 MG capsule Take 1 capsule (200 mg total) by mouth daily as needed for cough 20 capsule 0   buPROPion  (WELLBUTRIN  XL) 300 MG 24 hr tablet Take 1 tablet (300 mg total) by mouth daily. 90 tablet 3   celecoxib (CELEBREX) 200 MG capsule Take 200 mg by mouth.     chlorpheniramine-HYDROcodone  (TUSSIONEX) 10-8 MG/5ML Take 5 mLs by mouth.     Cholecalciferol  (VITAMIN D3) 1.25 MG (50000 UT) CAPS Take 1 capsule (50,000 Units total) by mouth once a week. 12 capsule 3   ciprofloxacin -dexamethasone  (CIPRODEX ) OTIC suspension Administer 4 drops into the right ear 2 (two) times a day for 10 days. 7.5 mL 4   clomiPHENE  (CLOMID ) 50 MG tablet Take 0.5 tablets (25 mg total) by mouth every other day. 30 tablet 2   cyclobenzaprine  (FLEXERIL ) 5 MG tablet Take 5-10 mg by mouth at bedtime as needed.     fluticasone  (FLONASE ) 50 MCG/ACT nasal spray Place 2 sprays into both nostrils daily. 16 g 11   fluticasone -salmeterol (ADVAIR  HFA) 115-21 MCG/ACT inhaler Inhale 2 puffs into the lungs 2 (two) times daily. 12 g 3   fluticasone -salmeterol (ADVAIR  HFA) 115-21 MCG/ACT inhaler Inhale two puffs into the lungs 2 (two) times daily. 12 g 12   furosemide (LASIX) 20 MG tablet Take 20 mg by mouth.     ibuprofen (ADVIL) 200 MG tablet Take 400 mg by mouth.      ipratropium (ATROVENT ) 0.06 % nasal spray Place 2 sprays into the nose 3 (three) times daily. 15 mL 5   levothyroxine  (SYNTHROID ) 25 MCG tablet Take 1 tablet (25 mcg total) by mouth daily. 90 tablet 1   methocarbamol  (ROBAXIN ) 500 MG tablet Take 1 tablet (500 mg total) by mouth 4 (four) times daily as needed for muscle pain/spasm. 120 tablet 1   methylPREDNISolone  (MEDROL  DOSEPAK) 4 MG TBPK tablet Take As Directed On Package 21 tablet 0   montelukast  (SINGULAIR ) 10 MG tablet TAKE 1 TABLET BY MOUTH EVERYDAY AT BEDTIME 90 tablet 3   montelukast  (SINGULAIR ) 10 MG tablet Take 1 tablet (10 mg total) by mouth at bedtime. 90 tablet 3   Oxycodone  HCl 10 MG TABS Take 1 tablet (10 mg total) by mouth every 8 (eight) hours as needed for pain. Max daily amount: 30 mg. 90 tablet 0   tirzepatide  (ZEPBOUND ) 10 MG/0.5ML Pen Inject 10 mg into the skin once a week. 2 mL 2   traZODone  (DESYREL ) 50 MG tablet Take 1-2 tablets (50-100 mg total) by mouth at bedtime. 180 tablet 1   albuterol  (VENTOLIN  HFA) 108 (90 Base) MCG/ACT inhaler Inhale 2 puffs into the lungs every 4 (four) hours as needed for wheezing or shortness of breath. (Patient not taking: Reported on 10/01/2023) 6.7 g 1   amoxicillin -clavulanate (AUGMENTIN ) 875-125 MG tablet Take 1 tablet by mouth 2 (two) times daily for 14 days (Patient not taking: Reported on 10/01/2023) 28 tablet 0   amphetamine -dextroamphetamine  (ADDERALL) 30 MG tablet Take 1 tablet by mouth 2 (two) times daily. May also take 0.5 tablets daily as needed. (Patient not taking: No sig reported) 75 tablet 0   benzonatate  (TESSALON ) 200 MG capsule Take 1 capsule (200 mg total) by mouth 3 (three) times daily as needed for cough. (Patient not taking: Reported on 10/01/2023) 20 capsule 0  benzonatate  (TESSALON ) 200 MG capsule Take 1 capsule (200 mg total) by mouth 3 (three) times daily as needed for cough (Patient not taking: Reported on 10/01/2023) 20 capsule 0   buPROPion  (WELLBUTRIN  XL) 300 MG 24 hr  tablet Take 1 tablet (300 mg total) by mouth daily. (Patient not taking: Reported on 10/01/2023) 90 tablet 3   Cholecalciferol  (VITAMIN D3) 1.25 MG (50000 UT) CAPS Take 1 capsule by mouth once a week at 9am. (Patient not taking: Reported on 10/01/2023) 12 capsule 3   clomiPHENE  (CLOMID ) 50 MG tablet Take 0.5 tablets (25 mg total) by mouth every other day. Patient must schedule appointment for further refills. (Patient not taking: Reported on 10/01/2023) 30 tablet 2   levothyroxine  (SYNTHROID ) 25 MCG tablet Take 1 tablet (25 mcg total) by mouth daily. (Patient not taking: Reported on 10/01/2023) 90 tablet 1   methocarbamol  (ROBAXIN ) 500 MG tablet Take 1 tablet (500 mg total) by mouth 4 (four) times daily. (Patient not taking: Reported on 10/01/2023) 120 tablet 1   Oxycodone  HCl 10 MG TABS Take 1 tablet (10 mg total) by mouth every 8 (eight) hours as needed for pain. Max amount 30 mg (Patient not taking: Reported on 10/01/2023) 90 tablet 0   predniSONE  (DELTASONE ) 10 MG tablet 3 tablets daily for 4 days, then 2 tablets daily for 4 days, then 1 tablet daily for 4 days. (Patient not taking: Reported on 10/01/2023) 24 tablet 0   traZODone  (DESYREL ) 50 MG tablet Take 1-2 tablets (50-100 mg total) by mouth at bedtime. (Patient not taking: Reported on 10/01/2023) 180 tablet 0   No facility-administered medications prior to visit.      Review of Systems  Review of Systems  Constitutional: Negative.   HENT:  Positive for congestion.   Respiratory:  Positive for chest tightness. Negative for shortness of breath and wheezing.   Cardiovascular: Negative.      Physical Exam  BP 136/74   Pulse 74   Temp 97.8 F (36.6 C)   Ht 6' (1.829 m)   Wt 261 lb 12.8 oz (118.8 kg)   SpO2 98% Comment: RA  BMI 35.51 kg/m  Physical Exam Constitutional:      General: He is not in acute distress.    Appearance: Normal appearance. He is not ill-appearing.  HENT:     Head: Normocephalic and atraumatic.     Right Ear:  Hearing normal.     Left Ear: Hearing normal. No drainage or swelling. Tympanic membrane is injected.     Mouth/Throat:     Mouth: Mucous membranes are moist.     Pharynx: Oropharynx is clear.  Cardiovascular:     Rate and Rhythm: Normal rate and regular rhythm.  Pulmonary:     Effort: Pulmonary effort is normal.     Breath sounds: Normal breath sounds. No wheezing.     Comments: CTA Skin:    General: Skin is warm and dry.  Neurological:     General: No focal deficit present.     Mental Status: He is alert and oriented to person, place, and time. Mental status is at baseline.  Psychiatric:        Mood and Affect: Mood normal.        Behavior: Behavior normal.        Thought Content: Thought content normal.        Judgment: Judgment normal.     Lab Results:  CBC    Component Value Date/Time   WBC 7.6 04/24/2023  0944   RBC 4.76 04/24/2023 0944   HGB 15.4 04/24/2023 0944   HCT 44.9 04/24/2023 0944   PLT 143.0 (L) 04/24/2023 0944   MCV 94.3 04/24/2023 0944   MCH 31.6 04/24/2015 1148   MCHC 34.4 04/24/2023 0944   RDW 14.0 04/24/2023 0944   LYMPHSABS 2.0 04/24/2023 0944   MONOABS 0.7 04/24/2023 0944   EOSABS 0.2 04/24/2023 0944   BASOSABS 0.0 04/24/2023 0944    BMET    Component Value Date/Time   NA 139 08/20/2023 0847   NA 144 01/17/2016 0945   K 3.9 08/20/2023 0847   CL 104 08/20/2023 0847   CO2 27 08/20/2023 0847   GLUCOSE 88 08/20/2023 0847   BUN 18 08/20/2023 0847   BUN 25 (H) 01/17/2016 0945   CREATININE 1.30 08/20/2023 0847   CREATININE 0.92 04/24/2015 1148   CALCIUM 9.4 08/20/2023 0847   GFRNONAA 110 01/17/2016 0945   GFRAA 127 01/17/2016 0945    BNP No results found for: BNP  ProBNP No results found for: PROBNP  Imaging: No results found.   Assessment & Plan:   No problem-specific Assessment & Plan notes found for this encounter.   1. Moderate obstructive sleep apnea (Primary) - Pulse oximetry, overnight; Future   Assessment and  Plan Assessment & Plan Asthma Asthma symptoms have improved significantly since March. Occasional flare-ups occur, often triggered by environmental factors such as dust. Morning chest tightness is likely related to CPAP use and sinus congestion. Current treatment with Advair  is effective, and albuterol  inhaler use is infrequent. PFTs were normal in January 2024 without evidence of obstructive airway disease, there was evidence of air trapping. CXR in March 2024 showed clear lungs. Eosinophil absolute 200.  - Continue Advair  115-21mcg twice daily  - Use albuterol  inhaler 2 puffs every 6 hours as needed for flare-ups. - Consider biologics like Dupixent if asthma symptoms worsen or if recurrent sinusitis exacerbates asthma symptoms more than twice a year.  Obstructive sleep apnea Obstructive sleep apnea is managed with CPAP. Reports of morning chest tightness and difficulty breathing on CPAP, possibly due to sinus congestion and CPAP settings. EPR adjustment may help alleviate exhalation discomfort  -Lower max CPAP pressure setting from 18cm h20 to 15cm h20 and lower EPR from level 2 to level 1 due to airleaks and chest discomfort - Perform overnight pulse oximetry test while using CPAP to monitor oxygen levels. - Communicate any changes or issues with CPAP settings through MyChart.  Recurrent sinusitis Recurrent sinusitis with recent treatment of amoxicillin  and prednisone . Sinus congestion likely contributing to breathing difficulties and CPAP discomfort. ENT evaluation ongoing with a planned CT scan with contrast to assess sinus condition further. - Complete CT scan with contrast as ordered by ENT. - Discuss CT results with ENT to determine further treatment options. - Consider Dupixent if chronic sinusitis is confirmed and continues to affect asthma.    Almarie LELON Ferrari, NP 10/01/2023

## 2023-10-08 NOTE — Progress Notes (Unsigned)
 [name] [address] [place] [address] Phone: [phone] Subjective:   Frank [name], am serving as a scribe for Dr. Arthea Claudene.  I'm seeing this patient by the request  of:  Juliane Che, PA  CC: back and neck pain   [name]  Frank [name] is a 33 y.o. male coming in with complaint of back and neck pain. OMT 09/10/2023. Patient states that he hurt his rib on R side last weekend. Pain is slowly improving. States that he has not been able to work out due to pain. Painful to bend forward.   Back is doing well otherwise.   Medications patient has been prescribed: None  Taking:         Reviewed prior external information including notes and imaging from previsou exam, outside providers and external EMR if available.   As well as notes that were available from care everywhere and other healthcare systems.  Past medical history, social, surgical and family history all reviewed in electronic medical record.  No pertanent information unless stated regarding to the chief complaint.   Past Medical History:  Diagnosis Date   ADD (attention deficit disorder)    Anxiety    Narcolepsy through school    Allergies  Allergen Reactions   Vueway  [Gadopiclenol ] Nausea Only     Review of Systems:  No headache, visual changes, nausea, vomiting, diarrhea, constipation, dizziness, abdominal pain, skin rash, fevers, chills, night sweats, weight loss, swollen lymph nodes, body aches, joint swelling, chest pain, shortness of breath, mood changes. POSITIVE muscle aches  Objective  Blood pressure 120/84, pulse 84, height 6' (1.829 m), weight 251 lb (113.9 kg), SpO2 97%.   General: No apparent distress alert and oriented x3 mood and affect normal, dressed appropriately.  HEENT: Pupils equal, extraocular movements intact  Respiratory: Patient's speak in full sentences and does not appear short of breath  Cardiovascular: No lower  extremity edema, non tender, no erythema  Gait MSK:  Back significant tightness still noted in the parascapular area on the right side.  Patient does have some tightness noted  Osteopathic findings  C2 flexed rotated and side bent right C6 flexed rotated and side bent right T3 extended rotated and side bent right inhaled rib T6 extended rotated and side bent right L2 flexed rotated and side bent right L4 flexed rotated sidebent left Sacrum right on right       Assessment and Plan:  Scheuermann's disease Continues to have some difficulty.  Discussed icing regimen and home exercises, discussed which activities to do and which ones to avoid.  Increase activity slowly.  Discussed icing regimen.  Toradol  and Depo-Medrol  given again today.  Has responded well to this.  Follow-up again in 6 to 8 weeks.    Nonallopathic problems  Decision today to treat with OMT was based on Physical Exam  After verbal consent patient was treated with HVLA, ME, FPR techniques in cervical, rib, thoracic, lumbar, and sacral  areas  Patient tolerated the procedure well with improvement in symptoms  Patient given exercises, stretches and lifestyle modifications  See medications in patient instructions if given  Patient will follow up in 4-8 weeks    The above documentation has been reviewed and is accurate and complete Jozette Castrellon M Samanthia Howland, DO          Note: This dictation was prepared with Dragon dictation along with smaller phrase technology. Any transcriptional errors that result from this process are unintentional.

## 2023-10-09 ENCOUNTER — Other Ambulatory Visit (HOSPITAL_COMMUNITY): Payer: Self-pay

## 2023-10-10 ENCOUNTER — Encounter: Payer: Self-pay | Admitting: Family Medicine

## 2023-10-10 ENCOUNTER — Ambulatory Visit (INDEPENDENT_AMBULATORY_CARE_PROVIDER_SITE_OTHER): Admitting: Family Medicine

## 2023-10-10 VITALS — BP 120/84 | HR 84 | Ht 72.0 in | Wt 251.0 lb

## 2023-10-10 DIAGNOSIS — M9902 Segmental and somatic dysfunction of thoracic region: Secondary | ICD-10-CM

## 2023-10-10 DIAGNOSIS — M9901 Segmental and somatic dysfunction of cervical region: Secondary | ICD-10-CM

## 2023-10-10 DIAGNOSIS — M9908 Segmental and somatic dysfunction of rib cage: Secondary | ICD-10-CM | POA: Diagnosis not present

## 2023-10-10 DIAGNOSIS — M9903 Segmental and somatic dysfunction of lumbar region: Secondary | ICD-10-CM

## 2023-10-10 DIAGNOSIS — R5383 Other fatigue: Secondary | ICD-10-CM

## 2023-10-10 DIAGNOSIS — M4200 Juvenile osteochondrosis of spine, site unspecified: Secondary | ICD-10-CM | POA: Diagnosis not present

## 2023-10-10 DIAGNOSIS — R0781 Pleurodynia: Secondary | ICD-10-CM

## 2023-10-10 DIAGNOSIS — M9904 Segmental and somatic dysfunction of sacral region: Secondary | ICD-10-CM

## 2023-10-10 MED ORDER — METHYLPREDNISOLONE ACETATE 80 MG/ML IJ SUSP
80.0000 mg | Freq: Once | INTRAMUSCULAR | Status: AC
  Administered 2023-10-10: 80 mg via INTRAMUSCULAR

## 2023-10-10 MED ORDER — KETOROLAC TROMETHAMINE 60 MG/2ML IM SOLN
60.0000 mg | Freq: Once | INTRAMUSCULAR | Status: AC
  Administered 2023-10-10: 60 mg via INTRAMUSCULAR

## 2023-10-10 NOTE — Patient Instructions (Signed)
 Good to see you Injections in backside See you soon

## 2023-10-10 NOTE — Assessment & Plan Note (Signed)
 Continues to have some difficulty.  Discussed icing regimen and home exercises, discussed which activities to do and which ones to avoid.  Increase activity slowly.  Discussed icing regimen.  Toradol  and Depo-Medrol  given again today.  Has responded well to this.  Follow-up again in 6 to 8 weeks.

## 2023-10-11 ENCOUNTER — Other Ambulatory Visit (HOSPITAL_COMMUNITY): Payer: Self-pay

## 2023-10-12 ENCOUNTER — Other Ambulatory Visit: Payer: Self-pay

## 2023-10-12 ENCOUNTER — Other Ambulatory Visit (HOSPITAL_COMMUNITY): Payer: Self-pay

## 2023-10-12 MED ORDER — CIPROFLOXACIN-DEXAMETHASONE 0.3-0.1 % OT SUSP
4.0000 [drp] | Freq: Two times a day (BID) | OTIC | 4 refills | Status: AC
  Filled 2023-10-12: qty 7.5, 10d supply, fill #0
  Filled 2023-11-11: qty 7.5, 10d supply, fill #1
  Filled 2023-12-04: qty 7.5, 10d supply, fill #2

## 2023-10-12 MED ORDER — METHOCARBAMOL 500 MG PO TABS
500.0000 mg | ORAL_TABLET | Freq: Four times a day (QID) | ORAL | 1 refills | Status: DC
  Filled 2023-10-12: qty 120, 30d supply, fill #0
  Filled 2023-11-11: qty 120, 30d supply, fill #1

## 2023-10-12 MED ORDER — AMPHETAMINE-DEXTROAMPHETAMINE 30 MG PO TABS
30.0000 mg | ORAL_TABLET | Freq: Two times a day (BID) | ORAL | 0 refills | Status: AC
  Filled 2023-10-12: qty 75, 38d supply, fill #0
  Filled 2023-10-16: qty 75, 30d supply, fill #0

## 2023-10-12 MED ORDER — BENZONATATE 200 MG PO CAPS
200.0000 mg | ORAL_CAPSULE | Freq: Every day | ORAL | 0 refills | Status: DC | PRN
  Filled 2023-10-12: qty 20, 20d supply, fill #0

## 2023-10-12 MED ORDER — OXYCODONE HCL 10 MG PO TABS
10.0000 mg | ORAL_TABLET | Freq: Three times a day (TID) | ORAL | 0 refills | Status: AC | PRN
  Filled 2023-10-12 – 2023-11-01 (×2): qty 90, 30d supply, fill #0

## 2023-10-16 ENCOUNTER — Other Ambulatory Visit (HOSPITAL_COMMUNITY): Payer: Self-pay

## 2023-10-16 ENCOUNTER — Ambulatory Visit: Admitting: Family Medicine

## 2023-10-22 NOTE — Progress Notes (Unsigned)
 [name] [address] [place] [address] Phone: [phone] Subjective:   Frank [name], am serving as a scribe for Dr. Arthea Claudene.  I'm seeing this patient by the request  of:  Juliane Che, PA  CC: back and neck pain   [name]  Frank [name] is a 33 y.o. male coming in with complaint of back and neck pain. OMT 10/10/2023. Patient states doing okay. Hurting a bit. No new symptoms.  Medications patient has been prescribed: None  Taking:         Reviewed prior external information including notes and imaging from previsou exam, outside providers and external EMR if available.   As well as notes that were available from care everywhere and other healthcare systems.  Past medical history, social, surgical and family history all reviewed in electronic medical record.  No pertanent information unless stated regarding to the chief complaint.   Past Medical History:  Diagnosis Date   ADD (attention deficit disorder)    Anxiety    Narcolepsy through school    Allergies  Allergen Reactions   Vueway  [Gadopiclenol ] Nausea Only     Review of Systems:  No headache, visual changes, nausea, vomiting, diarrhea, constipation, dizziness, abdominal pain, skin rash, fevers, chills, night sweats, weight loss, swollen lymph nodes, body aches, joint swelling, chest pain, shortness of breath, mood changes. POSITIVE muscle aches  Objective  Blood pressure 132/70, pulse 89, height 6' (1.829 m), weight 254 lb (115.2 kg), SpO2 98%.   General: No apparent distress alert and oriented x3 mood and affect normal, dressed appropriately.  HEENT: Pupils equal, extraocular movements intact  Respiratory: Patient's speak in full sentences and does not appear short of breath  Cardiovascular: No lower extremity edema, non tender, no erythema  Gait relatively normal MSK:  Back severe tightness noted in the paraspinal musculature still on the right  side of the parascapular area.  Patient does have pain at the thoracolumbar juncture noted as well.  Osteopathic findings  C2 flexed rotated and side bent right C6 flexed rotated and side bent left T3 extended rotated and side bent right inhaled rib T9 extended rotated and side bent right L2 flexed rotated and side bent right Sacrum right on right     Assessment and Plan:  Scheuermann's disease Known difficulty, continues to have pain more now on the right side.  Discussed with patient about icing regimen and home exercises, discussed which activities to do and which ones to avoid.  Increase activity slowly.  Discussed icing regimen.  Discussed Qvar  to see if this is more of a diaphragmatic irritation that is contributing with it not being quite as associated with range of motion.  Follow-up with me again in 6 to 8 weeks otherwise.    Nonallopathic problems  Decision today to treat with OMT was based on Physical Exam  After verbal consent patient was treated with HVLA, ME, FPR techniques in cervical, rib, thoracic, lumbar, and sacral  areas  Patient tolerated the procedure well with improvement in symptoms  Patient given exercises, stretches and lifestyle modifications  See medications in patient instructions if given  Patient will follow up in 4-8 weeks     The above documentation has been reviewed and is accurate and complete Frank Soule M Saket Hellstrom, DO         Note: This dictation was prepared with Dragon dictation along with smaller phrase technology. Any transcriptional errors that result from this process are unintentional.

## 2023-10-23 ENCOUNTER — Ambulatory Visit (INDEPENDENT_AMBULATORY_CARE_PROVIDER_SITE_OTHER): Admitting: Family Medicine

## 2023-10-23 ENCOUNTER — Encounter: Payer: Self-pay | Admitting: Family Medicine

## 2023-10-23 ENCOUNTER — Ambulatory Visit: Payer: Self-pay | Admitting: Family Medicine

## 2023-10-23 ENCOUNTER — Other Ambulatory Visit: Payer: Self-pay

## 2023-10-23 VITALS — BP 132/70 | HR 89 | Ht 72.0 in | Wt 254.0 lb

## 2023-10-23 DIAGNOSIS — R748 Abnormal levels of other serum enzymes: Secondary | ICD-10-CM

## 2023-10-23 DIAGNOSIS — M9908 Segmental and somatic dysfunction of rib cage: Secondary | ICD-10-CM

## 2023-10-23 DIAGNOSIS — M4200 Juvenile osteochondrosis of spine, site unspecified: Secondary | ICD-10-CM

## 2023-10-23 DIAGNOSIS — M9902 Segmental and somatic dysfunction of thoracic region: Secondary | ICD-10-CM

## 2023-10-23 DIAGNOSIS — M9901 Segmental and somatic dysfunction of cervical region: Secondary | ICD-10-CM | POA: Diagnosis not present

## 2023-10-23 DIAGNOSIS — R5383 Other fatigue: Secondary | ICD-10-CM | POA: Diagnosis not present

## 2023-10-23 DIAGNOSIS — M9904 Segmental and somatic dysfunction of sacral region: Secondary | ICD-10-CM | POA: Diagnosis not present

## 2023-10-23 DIAGNOSIS — M7918 Myalgia, other site: Secondary | ICD-10-CM | POA: Diagnosis not present

## 2023-10-23 DIAGNOSIS — M9903 Segmental and somatic dysfunction of lumbar region: Secondary | ICD-10-CM

## 2023-10-23 DIAGNOSIS — R1011 Right upper quadrant pain: Secondary | ICD-10-CM

## 2023-10-23 LAB — COMPREHENSIVE METABOLIC PANEL WITH GFR
ALT: 34 U/L (ref 0–53)
AST: 23 U/L (ref 0–37)
Albumin: 4.2 g/dL (ref 3.5–5.2)
Alkaline Phosphatase: 59 U/L (ref 39–117)
BUN: 18 mg/dL (ref 6–23)
CO2: 24 meq/L (ref 19–32)
Calcium: 9.4 mg/dL (ref 8.4–10.5)
Chloride: 107 meq/L (ref 96–112)
Creatinine, Ser: 0.97 mg/dL (ref 0.40–1.50)
GFR: 103.29 mL/min (ref >60.00)
Glucose, Bld: 96 mg/dL (ref 70–99)
Potassium: 3.8 meq/L (ref 3.5–5.1)
Sodium: 139 meq/L (ref 135–145)
Total Bilirubin: 0.6 mg/dL (ref 0.2–1.2)
Total Protein: 6.8 g/dL (ref 6.0–8.3)

## 2023-10-23 LAB — GAMMA GT: GGT: 80 U/L — ABNORMAL HIGH (ref 7–51)

## 2023-10-23 LAB — LIPASE: Lipase: 187 U/L — ABNORMAL HIGH (ref 11.0–59.0)

## 2023-10-23 MED ORDER — METHYLPREDNISOLONE ACETATE 80 MG/ML IJ SUSP
80.0000 mg | Freq: Once | INTRAMUSCULAR | Status: AC
  Administered 2023-10-23: 80 mg via INTRAMUSCULAR

## 2023-10-23 MED ORDER — QVAR REDIHALER 80 MCG/ACT IN AERB: 1.0000 | INHALATION_SPRAY | Freq: Every day | RESPIRATORY_TRACT | 1 refills | Status: DC

## 2023-10-23 MED ORDER — KETOROLAC TROMETHAMINE 60 MG/2ML IM SOLN
60.0000 mg | Freq: Once | INTRAMUSCULAR | Status: AC
  Administered 2023-10-23: 60 mg via INTRAMUSCULAR

## 2023-10-23 NOTE — Assessment & Plan Note (Addendum)
 Known difficulty, continues to have pain more now on the right side.  Discussed with patient about icing regimen and home exercises, discussed which activities to do and which ones to avoid.  Increase activity slowly.  Discussed icing regimen.  Discussed Qvar  to see if this is more of a diaphragmatic irritation that is contributing with it not being quite as associated with range of motion.  Follow-up with me again in 6 to 8 weeks otherwise.  Toradol  and Depo-Medrol  injection was given today secondary to the severity of the pain

## 2023-10-23 NOTE — Patient Instructions (Addendum)
 Cocktail injection  Labs today See you at next appointment

## 2023-10-25 NOTE — Progress Notes (Signed)
 [name] [address] [place] [address] Phone: [phone] Subjective:   Frank [name], am serving as a scribe for Dr. Arthea [name].  I'm seeing this patient by the request  of:  Frank Che, PA  CC: Back pain, elevated liver enzymes and lipase  [name]  Frank [name] is a 33 y.o. male coming in with complaint of back and neck pain. OMT 10/23/2023. Patient states that he is in pain but in less pain than usual. Qvar  is helpful thus far.   Medications patient has been prescribed: Qvar   Taking: Yes         Reviewed prior external information including notes and imaging from previsou exam, outside providers and external EMR if available.   As well as notes that were available from care everywhere and other healthcare systems.  Past medical history, social, surgical and family history all reviewed in electronic medical record.  No pertanent information unless stated regarding to the chief complaint.   Past Medical History:  Diagnosis Date   ADD (attention deficit disorder)    Anxiety    Narcolepsy through school    Allergies  Allergen Reactions   Vueway  [Gadopiclenol ] Nausea Only     Review of Systems:  No headache, visual changes, nausea, vomiting, diarrhea, constipation, dizziness, abdominal pain, skin rash, fevers, chills, night sweats, weight loss, swollen lymph nodes, body aches, joint swelling, chest pain, shortness of breath, mood changes. POSITIVE muscle aches  Objective  Blood pressure 122/80, pulse 82, height 6' (1.829 m), weight 252 lb (114.3 kg), SpO2 97%.   General: No apparent distress alert and oriented x3 mood and affect normal, dressed appropriately.  HEENT: Pupils equal, extraocular movements intact  Respiratory: Patient's speak in full sentences and does not appear short of breath  Cardiovascular: No lower extremity edema, non tender, no erythema  Gait relatively normal MSK:  Back does have  some loss lordosis noted.  Still tightness noted in the paraspinal musculature in the parascapular area right greater than left.  Patient is still have abdominal pain on the right side.  Some voluntary guarding noted with palpation.  Osteopathic findings  C2 flexed rotated and side bent right C7 flexed rotated and side bent right T3 extended rotated and side bent right inhaled rib T9 extended rotated and side bent left L2 flexed rotated and side bent right Sacrum right on right       Assessment and Plan:  Scheuermann's disease Chronic, still exacerbation with patient going on a hunting trip recently.  Do feel the backpack may have caused some discomfort.  Discussed icing regimen and home exercises.  Follow-up with me again 3 to 4 weeks.  Toradol  and Depo-Medrol  injections given today.  Abdominal pain, chronic, right upper quadrant Patient is on a GLP-1 but does have elevated enzymes with lipase as well as GGT as well as had elevated liver enzymes.  Will get MRI abdomen with and without contrast and patient is to be scheduling it for further evaluation.    Nonallopathic problems  Decision today to treat with OMT was based on Physical Exam  After verbal consent patient was treated with HVLA, ME, FPR techniques in cervical, rib, thoracic, lumbar, and sacral  areas  Patient tolerated the procedure well with improvement in symptoms  Patient given exercises, stretches and lifestyle modifications  See medications in patient instructions if given  Patient will follow up in 4-8 weeks     The above documentation has been reviewed and  is accurate and complete Frank CHRISTELLA Sharps, DO         Note: This dictation was prepared with Dragon dictation along with smaller phrase technology. Any transcriptional errors that result from this process are unintentional.

## 2023-10-27 LAB — INSULIN, FREE (BIOACTIVE): Insulin, Free: 26.2 u[IU]/mL — ABNORMAL HIGH (ref 1.5–14.9)

## 2023-10-31 ENCOUNTER — Encounter: Payer: Self-pay | Admitting: Family Medicine

## 2023-10-31 ENCOUNTER — Ambulatory Visit (INDEPENDENT_AMBULATORY_CARE_PROVIDER_SITE_OTHER): Admitting: Family Medicine

## 2023-10-31 VITALS — BP 122/80 | HR 82 | Ht 72.0 in | Wt 252.0 lb

## 2023-10-31 DIAGNOSIS — M9908 Segmental and somatic dysfunction of rib cage: Secondary | ICD-10-CM

## 2023-10-31 DIAGNOSIS — M9902 Segmental and somatic dysfunction of thoracic region: Secondary | ICD-10-CM | POA: Diagnosis not present

## 2023-10-31 DIAGNOSIS — R1011 Right upper quadrant pain: Secondary | ICD-10-CM

## 2023-10-31 DIAGNOSIS — M542 Cervicalgia: Secondary | ICD-10-CM

## 2023-10-31 DIAGNOSIS — M9903 Segmental and somatic dysfunction of lumbar region: Secondary | ICD-10-CM | POA: Diagnosis not present

## 2023-10-31 DIAGNOSIS — G8929 Other chronic pain: Secondary | ICD-10-CM

## 2023-10-31 DIAGNOSIS — M9901 Segmental and somatic dysfunction of cervical region: Secondary | ICD-10-CM | POA: Diagnosis not present

## 2023-10-31 DIAGNOSIS — M4200 Juvenile osteochondrosis of spine, site unspecified: Secondary | ICD-10-CM

## 2023-10-31 DIAGNOSIS — M9904 Segmental and somatic dysfunction of sacral region: Secondary | ICD-10-CM

## 2023-10-31 DIAGNOSIS — M545 Low back pain, unspecified: Secondary | ICD-10-CM

## 2023-10-31 MED ORDER — METHYLPREDNISOLONE ACETATE 40 MG/ML IJ SUSP
40.0000 mg | Freq: Once | INTRAMUSCULAR | Status: AC
  Administered 2023-10-31: 40 mg via INTRAMUSCULAR

## 2023-10-31 MED ORDER — KETOROLAC TROMETHAMINE 30 MG/ML IJ SOLN
30.0000 mg | Freq: Once | INTRAMUSCULAR | Status: AC
  Administered 2023-10-31: 30 mg via INTRAMUSCULAR

## 2023-10-31 NOTE — Assessment & Plan Note (Signed)
 Patient is on a GLP-1 but does have elevated enzymes with lipase as well as GGT as well as had elevated liver enzymes.  Will get MRI abdomen with and without contrast and patient is to be scheduling it for further evaluation.

## 2023-10-31 NOTE — Patient Instructions (Signed)
 1/2 cocktail today DHEA 50mg  for 4 weeks  Call and get MRI See me as scheduled

## 2023-10-31 NOTE — Assessment & Plan Note (Signed)
 Chronic, still exacerbation with patient going on a hunting trip recently.  Do feel the backpack may have caused some discomfort.  Discussed icing regimen and home exercises.  Follow-up with me again 3 to 4 weeks.  Toradol  and Depo-Medrol  injections given today.

## 2023-11-01 ENCOUNTER — Other Ambulatory Visit (HOSPITAL_COMMUNITY): Payer: Self-pay

## 2023-11-03 ENCOUNTER — Other Ambulatory Visit (HOSPITAL_COMMUNITY): Payer: Self-pay

## 2023-11-11 ENCOUNTER — Other Ambulatory Visit: Payer: Self-pay | Admitting: Primary Care

## 2023-11-11 ENCOUNTER — Other Ambulatory Visit (HOSPITAL_COMMUNITY): Payer: Self-pay

## 2023-11-12 ENCOUNTER — Other Ambulatory Visit (HOSPITAL_COMMUNITY): Payer: Self-pay

## 2023-11-12 ENCOUNTER — Other Ambulatory Visit: Payer: Self-pay

## 2023-11-12 ENCOUNTER — Other Ambulatory Visit

## 2023-11-12 MED ORDER — ALBUTEROL SULFATE HFA 108 (90 BASE) MCG/ACT IN AERS
2.0000 | INHALATION_SPRAY | RESPIRATORY_TRACT | 1 refills | Status: AC | PRN
  Filled 2023-11-12: qty 6.7, 25d supply, fill #0
  Filled 2024-02-11: qty 6.7, 25d supply, fill #1

## 2023-11-12 MED ORDER — LEVOTHYROXINE SODIUM 25 MCG PO TABS
ORAL_TABLET | ORAL | 1 refills | Status: AC
  Filled 2023-11-12 – 2024-02-11 (×3): qty 90, 90d supply, fill #0

## 2023-11-12 MED ORDER — FLUTICASONE-SALMETEROL 115-21 MCG/ACT IN AERO
2.0000 | INHALATION_SPRAY | Freq: Two times a day (BID) | RESPIRATORY_TRACT | 0 refills | Status: DC
  Filled 2023-11-12 – 2023-12-04 (×2): qty 12, 30d supply, fill #0

## 2023-11-12 MED ORDER — AMPHETAMINE-DEXTROAMPHETAMINE 30 MG PO TABS
30.0000 mg | ORAL_TABLET | Freq: Two times a day (BID) | ORAL | 0 refills | Status: DC
  Filled 2023-11-13: qty 75, 30d supply, fill #0

## 2023-11-13 ENCOUNTER — Other Ambulatory Visit (HOSPITAL_COMMUNITY): Payer: Self-pay

## 2023-11-14 ENCOUNTER — Other Ambulatory Visit

## 2023-11-15 ENCOUNTER — Ambulatory Visit: Payer: Self-pay | Admitting: Family Medicine

## 2023-11-15 ENCOUNTER — Ambulatory Visit
Admission: RE | Admit: 2023-11-15 | Discharge: 2023-11-15 | Disposition: A | Discharge status: Home / Self Care | Source: Ambulatory Visit | Attending: Family Medicine | Admitting: Family Medicine

## 2023-11-15 ENCOUNTER — Other Ambulatory Visit (HOSPITAL_COMMUNITY): Payer: Self-pay

## 2023-11-15 DIAGNOSIS — R1011 Right upper quadrant pain: Secondary | ICD-10-CM

## 2023-11-15 DIAGNOSIS — R748 Abnormal levels of other serum enzymes: Secondary | ICD-10-CM

## 2023-11-15 MED ORDER — GADOPICLENOL 0.5 MMOL/ML IV SOLN
10.0000 mL | Freq: Once | INTRAVENOUS | Status: AC | PRN
  Administered 2023-11-15: 10 mL via INTRAVENOUS

## 2023-11-16 ENCOUNTER — Other Ambulatory Visit (HOSPITAL_COMMUNITY): Payer: Self-pay

## 2023-11-19 NOTE — Progress Notes (Unsigned)
 [name] [address] [place] [address] Phone: [phone] Subjective:   [name], am serving as a scribe for Dr. Arthea Claudene.  I'm seeing this patient by the request  of:  Juliane Che, PA  CC: back and neck pain   Frank [name]  Frank [name] is a 33 y.o. male coming in with complaint of back and neck pain. OMT 10/31/2023. Patient states   Medications patient has been prescribed: None  Taking:      MRI of the abdomen did not show anything significant except a simple cyst of the right kidney.  Mild enlargement in the spleen.   Reviewed prior external information including notes and imaging from previsou exam, outside providers and external EMR if available.   As well as notes that were available from care everywhere and other healthcare systems.  Past medical history, social, surgical and family history all reviewed in electronic medical record.  No pertanent information unless stated regarding to the chief complaint.   Past Medical History:  Diagnosis Date   ADD (attention deficit disorder)    Anxiety    Narcolepsy through school    Allergies  Allergen Reactions   Vueway  [Gadopiclenol ] Nausea Only     Review of Systems:  No headache, visual changes, nausea, vomiting, diarrhea, constipation, dizziness, abdominal pain, skin rash, fevers, chills, night sweats, weight loss, swollen lymph nodes, body aches, joint swelling, chest pain, shortness of breath, mood changes. POSITIVE muscle aches  Objective  Blood pressure 120/82, pulse 68, height 6' (1.829 m), weight 255 lb (115.7 kg), SpO2 98%.   General: No apparent distress alert and oriented x3 mood and affect normal, dressed appropriately.  HEENT: Pupils equal, extraocular movements intact  Respiratory: Patient's speak in full sentences and does not appear short of breath  Cardiovascular: No lower extremity edema, non tender, no erythema  Gait MSK:  Back does  have some loss lordosis noted.  Some tenderness to palpation in the paraspinal musculature.  Tightness with FABER test patient does have some difficulty with some extension of the back.  Osteopathic findings  C2 flexed rotated and side bent right C6 flexed rotated and side bent left T3 extended rotated and side bent right inhaled rib T6 extended rotated and side bent left L1 flexed rotated and side bent right Sacrum left on left     Assessment and Plan:  Scheuermann's disease Chronic low but still responding to treatment related to the Toradol  and Depo-Medrol  as well as some osteopathic manipulation after further evaluation.  Discussed icing regimen and home exercises, discussed which activities to do and which ones to avoid.  No change in medication but patient has right-sided pain seems to be improving with the Qvar .  Discussed the DHEA still to see and retest testosterone  at next follow-up.  Follow-up again in 6 to 8 weeks.    Nonallopathic problems  Decision today to treat with OMT was based on Physical Exam  After verbal consent patient was treated with HVLA, ME, FPR techniques in cervical, rib, thoracic, lumbar, and sacral  areas  Patient tolerated the procedure well with improvement in symptoms  Patient given exercises, stretches and lifestyle modifications  See medications in patient instructions if given  Patient will follow up in 4-8 weeks     The above documentation has been reviewed and is accurate and complete Lasaundra Riche M Carlo Guevarra, DO         Note: This dictation was prepared with Dragon dictation along with  smaller phrase technology. Any transcriptional errors that result from this process are unintentional.

## 2023-11-20 ENCOUNTER — Other Ambulatory Visit (HOSPITAL_COMMUNITY): Payer: Self-pay

## 2023-11-21 ENCOUNTER — Encounter: Payer: Self-pay | Admitting: Family Medicine

## 2023-11-21 ENCOUNTER — Ambulatory Visit: Admitting: Family Medicine

## 2023-11-21 VITALS — BP 120/82 | HR 68 | Ht 72.0 in | Wt 255.0 lb

## 2023-11-21 DIAGNOSIS — M9908 Segmental and somatic dysfunction of rib cage: Secondary | ICD-10-CM | POA: Diagnosis not present

## 2023-11-21 DIAGNOSIS — M9903 Segmental and somatic dysfunction of lumbar region: Secondary | ICD-10-CM

## 2023-11-21 DIAGNOSIS — M9901 Segmental and somatic dysfunction of cervical region: Secondary | ICD-10-CM

## 2023-11-21 DIAGNOSIS — M9904 Segmental and somatic dysfunction of sacral region: Secondary | ICD-10-CM

## 2023-11-21 DIAGNOSIS — M4200 Juvenile osteochondrosis of spine, site unspecified: Secondary | ICD-10-CM

## 2023-11-21 DIAGNOSIS — M9902 Segmental and somatic dysfunction of thoracic region: Secondary | ICD-10-CM

## 2023-11-21 MED ORDER — KETOROLAC TROMETHAMINE 60 MG/2ML IM SOLN
60.0000 mg | Freq: Once | INTRAMUSCULAR | Status: AC
  Administered 2023-11-21: 60 mg via INTRAMUSCULAR

## 2023-11-21 MED ORDER — METHYLPREDNISOLONE ACETATE 80 MG/ML IJ SUSP
80.0000 mg | Freq: Once | INTRAMUSCULAR | Status: AC
  Administered 2023-11-21: 80 mg via INTRAMUSCULAR

## 2023-11-21 NOTE — Assessment & Plan Note (Signed)
 Chronic low but still responding to treatment related to the Toradol  and Depo-Medrol  as well as some osteopathic manipulation after further evaluation.  Discussed icing regimen and home exercises, discussed which activities to do and which ones to avoid.  No change in medication but patient has right-sided pain seems to be improving with the Qvar .  Discussed the DHEA still to see and retest testosterone  at next follow-up.  Follow-up again in 6 to 8 weeks.

## 2023-11-26 ENCOUNTER — Other Ambulatory Visit (HOSPITAL_COMMUNITY): Payer: Self-pay

## 2023-11-30 ENCOUNTER — Other Ambulatory Visit (HOSPITAL_COMMUNITY): Payer: Self-pay

## 2023-11-30 MED ORDER — IPRATROPIUM BROMIDE 0.06 % NA SOLN
2.0000 | Freq: Three times a day (TID) | NASAL | 5 refills | Status: AC
  Filled 2023-11-30: qty 15, 30d supply, fill #0
  Filled 2023-12-01 – 2024-01-01 (×2): qty 15, 25d supply, fill #0
  Filled 2024-02-11: qty 15, 25d supply, fill #1

## 2023-11-30 MED ORDER — OXYCODONE HCL 10 MG PO TABS
10.0000 mg | ORAL_TABLET | Freq: Three times a day (TID) | ORAL | 0 refills | Status: DC | PRN
  Filled 2023-12-01: qty 90, 30d supply, fill #0

## 2023-12-01 ENCOUNTER — Other Ambulatory Visit (HOSPITAL_COMMUNITY): Payer: Self-pay

## 2023-12-03 ENCOUNTER — Other Ambulatory Visit: Payer: Self-pay

## 2023-12-04 ENCOUNTER — Other Ambulatory Visit (HOSPITAL_COMMUNITY): Payer: Self-pay

## 2023-12-04 MED ORDER — METHOCARBAMOL 500 MG PO TABS
500.0000 mg | ORAL_TABLET | Freq: Four times a day (QID) | ORAL | 1 refills | Status: DC
  Filled 2023-12-04 – 2024-01-01 (×2): qty 120, 30d supply, fill #0
  Filled 2024-02-11: qty 120, 30d supply, fill #1

## 2023-12-05 ENCOUNTER — Other Ambulatory Visit: Payer: Self-pay

## 2023-12-05 ENCOUNTER — Other Ambulatory Visit (HOSPITAL_COMMUNITY): Payer: Self-pay

## 2023-12-11 NOTE — Progress Notes (Unsigned)
 [name] [address] [place] [address] Phone: [phone] Subjective:    I'm seeing this patient by the request  of:  Juliane Che, PA  CC: Multiple joint complaints  [name]  Frank [name] is a 33 y.o. male coming in with complaint of back and neck pain. OMT 11/21/2023. Patient states he has some pain. Inhaler is helping. Opinion need on a possible surgery patient is thinking of having a sinus surgery.  Would be scheduled for next week.  Medications patient has been prescribed: None  Taking:         Reviewed prior external information including notes and imaging from previsou exam, outside providers and external EMR if available.   As well as notes that were available from care everywhere and other healthcare systems.  Past medical history, social, surgical and family history all reviewed in electronic medical record.  No pertanent information unless stated regarding to the chief complaint.   Past Medical History:  Diagnosis Date   ADD (attention deficit disorder)    Anxiety    Narcolepsy through school    Allergies  Allergen Reactions   Vueway  [Gadopiclenol ] Nausea Only     Review of Systems:  No headache, visual changes, nausea, vomiting, diarrhea, constipation, dizziness, abdominal pain, skin rash, fevers, chills, night sweats, weight loss, swollen lymph nodes, body aches, joint swelling, chest pain, shortness of breath, mood changes. POSITIVE muscle aches  Objective  Blood pressure 118/78, pulse 92, height 6' (1.829 m), weight 253 lb (114.8 kg), SpO2 98%.   General: No apparent distress alert and oriented x3 mood and affect normal, dressed appropriately.  HEENT: Pupils equal, extraocular movements intact  Respiratory: Patient's speak in full sentences and does not appear short of breath  Cardiovascular: No lower extremity edema, non tender, no erythema  Gait MSK:  Back does have some loss of lordosis  noted.  Some tenderness to palpation in the paraspinal musculature.  There is severe limitation in range of motion especially of the thoracolumbar juncture in the lumbar area itself.  Osteopathic findings  C2 flexed rotated and side bent right C7 flexed rotated and side bent left T3 extended rotated and side bent right inhaled rib T7 extended rotated and side bent left L2 flexed rotated and side bent right Sacrum right on right     Assessment and Plan:  Chronic sinusitis with recurrent bronchitis Has had significant difficulty in the coming to the season where patient does have usually more sinusitis.  He is thinking of having a turbinate reduction and septoplasty.  I think it would be a potentially good idea for him at this point.  Patient is going to follow-up with the surgeon in the near future.  Discussed icing regimen and home exercises otherwise.  Increase activity slowly.  Follow-up again in 6 to 8 weeks  Degeneration of intervertebral disc of thoracic region Continue chronic discomfort.  Because patient is unable to use any anti-inflammatories with patient having surgery coming up we will do a Toradol  and Depo-Medrol  injections today.  Discussed icing regimen and home exercises, discussed which activities to do with her 20s to avoid.  Increase activity slowly.  Discussed icing regimen.  Follow-up again in 6 to 8 weeks otherwise.    Nonallopathic problems  Decision today to treat with OMT was based on Physical Exam  After verbal consent patient was treated with HVLA, ME, FPR techniques in cervical, rib, thoracic, lumbar, and sacral  areas  Patient tolerated the procedure  well with improvement in symptoms  Patient given exercises, stretches and lifestyle modifications  See medications in patient instructions if given  Patient will follow up in 4-8 weeks     The above documentation has been reviewed and is accurate and complete [name] M [name], DO         Note: This  dictation was prepared with Dragon dictation along with smaller phrase technology. Any transcriptional errors that result from this process are unintentional.

## 2023-12-12 ENCOUNTER — Ambulatory Visit: Admitting: Family Medicine

## 2023-12-12 ENCOUNTER — Encounter: Payer: Self-pay | Admitting: Family Medicine

## 2023-12-12 VITALS — BP 118/78 | HR 92 | Ht 72.0 in | Wt 253.0 lb

## 2023-12-12 DIAGNOSIS — M5134 Other intervertebral disc degeneration, thoracic region: Secondary | ICD-10-CM

## 2023-12-12 DIAGNOSIS — M9901 Segmental and somatic dysfunction of cervical region: Secondary | ICD-10-CM

## 2023-12-12 DIAGNOSIS — J329 Chronic sinusitis, unspecified: Secondary | ICD-10-CM | POA: Diagnosis not present

## 2023-12-12 DIAGNOSIS — M9902 Segmental and somatic dysfunction of thoracic region: Secondary | ICD-10-CM

## 2023-12-12 DIAGNOSIS — M9903 Segmental and somatic dysfunction of lumbar region: Secondary | ICD-10-CM

## 2023-12-12 DIAGNOSIS — M9908 Segmental and somatic dysfunction of rib cage: Secondary | ICD-10-CM

## 2023-12-12 DIAGNOSIS — M9904 Segmental and somatic dysfunction of sacral region: Secondary | ICD-10-CM

## 2023-12-12 DIAGNOSIS — J40 Bronchitis, not specified as acute or chronic: Secondary | ICD-10-CM | POA: Diagnosis not present

## 2023-12-12 MED ORDER — KETOROLAC TROMETHAMINE 60 MG/2ML IM SOLN
60.0000 mg | Freq: Once | INTRAMUSCULAR | Status: AC
  Administered 2023-12-12: 60 mg via INTRAMUSCULAR

## 2023-12-12 MED ORDER — METHYLPREDNISOLONE ACETATE 80 MG/ML IJ SUSP
80.0000 mg | Freq: Once | INTRAMUSCULAR | Status: AC
  Administered 2023-12-12: 80 mg via INTRAMUSCULAR

## 2023-12-12 NOTE — Assessment & Plan Note (Signed)
 Has had significant difficulty in the coming to the season where patient does have usually more sinusitis.  He is thinking of having a turbinate reduction and septoplasty.  I think it would be a potentially good idea for him at this point.  Patient is going to follow-up with the surgeon in the near future.  Discussed icing regimen and home exercises otherwise.  Increase activity slowly.  Follow-up again in 6 to 8 weeks

## 2023-12-12 NOTE — Assessment & Plan Note (Signed)
 Continue chronic discomfort.  Because patient is unable to use any anti-inflammatories with patient having surgery coming up we will do a Toradol  and Depo-Medrol  injections today.  Discussed icing regimen and home exercises, discussed which activities to do with her 20s to avoid.  Increase activity slowly.  Discussed icing regimen.  Follow-up again in 6 to 8 weeks otherwise.

## 2023-12-12 NOTE — Patient Instructions (Addendum)
 Full Cocktail today.  Repeat epidural

## 2023-12-13 ENCOUNTER — Other Ambulatory Visit: Payer: Self-pay

## 2023-12-13 ENCOUNTER — Other Ambulatory Visit (HOSPITAL_COMMUNITY): Payer: Self-pay

## 2023-12-13 MED ORDER — AMPHETAMINE-DEXTROAMPHETAMINE 30 MG PO TABS
30.0000 mg | ORAL_TABLET | Freq: Two times a day (BID) | ORAL | 0 refills | Status: DC
  Filled 2023-12-13: qty 75, 30d supply, fill #0

## 2023-12-13 MED ORDER — BENZONATATE 200 MG PO CAPS
200.0000 mg | ORAL_CAPSULE | Freq: Every day | ORAL | 0 refills | Status: DC | PRN
  Filled 2023-12-13: qty 20, 20d supply, fill #0

## 2023-12-13 MED ORDER — FLUTICASONE PROPIONATE 50 MCG/ACT NA SUSP
2.0000 | Freq: Every day | NASAL | 12 refills | Status: AC
  Filled 2023-12-13: qty 16, 30d supply, fill #0
  Filled 2024-01-01: qty 16, 30d supply, fill #1

## 2023-12-13 MED ORDER — ALBUTEROL SULFATE HFA 108 (90 BASE) MCG/ACT IN AERS
INHALATION_SPRAY | RESPIRATORY_TRACT | 1 refills | Status: AC
  Filled 2023-12-13: qty 6.7, 16d supply, fill #0
  Filled 2024-02-11: qty 6.7, 16d supply, fill #1

## 2023-12-13 MED ORDER — TRAZODONE HCL 50 MG PO TABS
50.0000 mg | ORAL_TABLET | Freq: Every day | ORAL | 1 refills | Status: AC
  Filled 2023-12-13 – 2024-01-01 (×2): qty 180, 90d supply, fill #0

## 2023-12-14 ENCOUNTER — Other Ambulatory Visit (HOSPITAL_COMMUNITY): Payer: Self-pay

## 2023-12-17 ENCOUNTER — Other Ambulatory Visit (HOSPITAL_COMMUNITY): Payer: Self-pay

## 2023-12-17 MED ORDER — MUPIROCIN 2 % EX OINT
TOPICAL_OINTMENT | CUTANEOUS | 0 refills | Status: DC
  Filled 2023-12-17: qty 22, 7d supply, fill #0

## 2023-12-17 MED ORDER — HYDROCODONE-ACETAMINOPHEN 5-325 MG PO TABS
1.0000 | ORAL_TABLET | Freq: Four times a day (QID) | ORAL | 0 refills | Status: AC
  Filled 2023-12-17: qty 7, 2d supply, fill #0

## 2023-12-17 MED ORDER — AMOXICILLIN-POT CLAVULANATE 875-125 MG PO TABS
ORAL_TABLET | ORAL | 0 refills | Status: DC
  Filled 2023-12-17: qty 14, 7d supply, fill #0

## 2023-12-17 MED ORDER — CEPHALEXIN 500 MG PO CAPS
500.0000 mg | ORAL_CAPSULE | Freq: Three times a day (TID) | ORAL | 0 refills | Status: DC
  Filled 2023-12-17: qty 28, 7d supply, fill #0

## 2023-12-18 ENCOUNTER — Other Ambulatory Visit (HOSPITAL_COMMUNITY): Payer: Self-pay

## 2023-12-27 NOTE — Progress Notes (Signed)
 [name] [address] [place] [address] Phone: [phone] Subjective:   Frank [name] am a scribe for Dr. Claudene.   I'm seeing this patient by the request  of:  Juliane Che, PA  CC: Back and neck pain follow-up  [name]  Frank [name] is a 33 y.o. male coming in with complaint of back and neck pain. OMT 12/12/2023. Patient states recently had nose surgery so that he can breath better. Back and neck are awful. Has been in a chair for two weeks. On restrictions for bending and lifting.   Medications patient has been prescribed: QVAR   Taking: yes         Reviewed prior external information including notes and imaging from previsou exam, outside providers and external EMR if available.   As well as notes that were available from care everywhere and other healthcare systems.  Past medical history, social, surgical and family history all reviewed in electronic medical record.  No pertanent information unless stated regarding to the chief complaint.   Past Medical History:  Diagnosis Date   ADD (attention deficit disorder)    Anxiety    Narcolepsy through school    Allergies  Allergen Reactions   Vueway  [Gadopiclenol ] Nausea Only     Review of Systems:  No headache, visual changes, nausea, vomiting, diarrhea, constipation, dizziness, abdominal pain, skin rash, fevers, chills, night sweats, weight loss, swollen lymph nodes, body aches, joint swelling, chest pain, shortness of breath, mood changes. POSITIVE muscle aches  Objective  Blood pressure 124/70, pulse 89, height 6' (1.829 m), weight 247 lb (112 kg), SpO2 97%.   General: No apparent distress alert and oriented x3 mood and affect normal, dressed appropriately.  HEENT: Pupils equal, extraocular movements intact  Respiratory: Patient's speak in full sentences and does not appear short of breath  Cardiovascular: No lower extremity edema, non tender, no erythema   Gait MSK:  Back significant loss of lordosis.  Patient has done well with weight loss but still has some scapular dyskinesis noted.  Pain on the right side of the upper back.  Low back also significantly tight in the musculature more than the axial skeleton.  Negative straight leg test.  Tightness noted though in the lower extremities bilaterally as well.  Neck exam does have limited sidebending bilaterally  Osteopathic findings  C2 flexed rotated and side bent right C6 flexed rotated and side bent left T3 extended rotated and side bent right inhaled rib T6 extended rotated and side bent left L3 flexed rotated and side bent right Sacrum right on right       Assessment and Plan:  Degeneration of intervertebral disc of thoracic region Significant tightness noted and patient has not been able to be as active.  Patient is losing weight which I do think will be beneficial in the long-term.  I is going to try to titrate off of his weight loss medication in the relatively near future.  No Toradol  given with patient having recent surgery.  Discussed Depo-Medrol .  Follow-up again in 6 to 12 weeks  Scheuermann's disease Chronic problem with more stiffness secondary to patient not being able to be active after his sinus surgery.  Discussed with patient about icing regimen and home exercises, which activities to do and which ones to avoid.  Increase activity slowly.  Follow-up again in 6 to 12 weeks    Nonallopathic problems  Decision today to treat with OMT was based on Physical Exam  After verbal consent patient was treated with HVLA, ME, FPR techniques in cervical, rib, thoracic, lumbar, and sacral  areas  Patient tolerated the procedure well with improvement in symptoms  Patient given exercises, stretches and lifestyle modifications  See medications in patient instructions if given  Patient will follow up in 4-8 weeks    The above documentation has been reviewed and is accurate and  complete [name], DO          Note: This dictation was prepared with Dragon dictation along with smaller phrase technology. Any transcriptional errors that result from this process are unintentional.

## 2023-12-31 ENCOUNTER — Ambulatory Visit: Admitting: Family Medicine

## 2023-12-31 ENCOUNTER — Encounter: Payer: Self-pay | Admitting: Family Medicine

## 2023-12-31 VITALS — BP 124/70 | HR 89 | Ht 72.0 in | Wt 247.0 lb

## 2023-12-31 DIAGNOSIS — M9901 Segmental and somatic dysfunction of cervical region: Secondary | ICD-10-CM

## 2023-12-31 DIAGNOSIS — M9904 Segmental and somatic dysfunction of sacral region: Secondary | ICD-10-CM | POA: Diagnosis not present

## 2023-12-31 DIAGNOSIS — M4200 Juvenile osteochondrosis of spine, site unspecified: Secondary | ICD-10-CM | POA: Diagnosis not present

## 2023-12-31 DIAGNOSIS — M9902 Segmental and somatic dysfunction of thoracic region: Secondary | ICD-10-CM

## 2023-12-31 DIAGNOSIS — M9903 Segmental and somatic dysfunction of lumbar region: Secondary | ICD-10-CM

## 2023-12-31 DIAGNOSIS — M9908 Segmental and somatic dysfunction of rib cage: Secondary | ICD-10-CM | POA: Diagnosis not present

## 2023-12-31 DIAGNOSIS — M5134 Other intervertebral disc degeneration, thoracic region: Secondary | ICD-10-CM

## 2023-12-31 NOTE — Assessment & Plan Note (Signed)
 Significant tightness noted and patient has not been able to be as active.  Patient is losing weight which I do think will be beneficial in the long-term.  I is going to try to titrate off of his weight loss medication in the relatively near future.  No Toradol  given with patient having recent surgery.  Discussed Depo-Medrol .  Follow-up again in 6 to 12 weeks

## 2023-12-31 NOTE — Assessment & Plan Note (Signed)
 Chronic problem with more stiffness secondary to patient not being able to be active after his sinus surgery.  Discussed with patient about icing regimen and home exercises, which activities to do and which ones to avoid.  Increase activity slowly.  Follow-up again in 6 to 12 weeks

## 2023-12-31 NOTE — Patient Instructions (Signed)
 Good to see you. See you at your next visit.

## 2024-01-01 ENCOUNTER — Other Ambulatory Visit (HOSPITAL_COMMUNITY): Payer: Self-pay

## 2024-01-02 ENCOUNTER — Other Ambulatory Visit (HOSPITAL_COMMUNITY): Payer: Self-pay

## 2024-01-02 ENCOUNTER — Ambulatory Visit: Admitting: Family Medicine

## 2024-01-02 MED ORDER — MUPIROCIN 2 % EX OINT
TOPICAL_OINTMENT | Freq: Three times a day (TID) | CUTANEOUS | 1 refills | Status: AC
Start: 2024-01-02 — End: ?
  Filled 2024-01-02: qty 22, 10d supply, fill #0

## 2024-01-02 MED ORDER — OXYCODONE HCL 10 MG PO TABS
10.0000 mg | ORAL_TABLET | Freq: Three times a day (TID) | ORAL | 0 refills | Status: AC | PRN
  Filled 2024-02-03: qty 90, 30d supply, fill #0

## 2024-01-02 MED ORDER — OXYCODONE HCL 10 MG PO TABS
10.0000 mg | ORAL_TABLET | Freq: Three times a day (TID) | ORAL | 0 refills | Status: AC | PRN
  Filled 2024-01-02: qty 90, 30d supply, fill #0

## 2024-01-02 MED ORDER — OXYCODONE HCL 10 MG PO TABS
10.0000 mg | ORAL_TABLET | Freq: Three times a day (TID) | ORAL | 0 refills | Status: DC | PRN
  Filled 2024-03-06: qty 90, 30d supply, fill #0

## 2024-01-03 ENCOUNTER — Other Ambulatory Visit (HOSPITAL_COMMUNITY): Payer: Self-pay

## 2024-01-04 ENCOUNTER — Other Ambulatory Visit

## 2024-01-11 ENCOUNTER — Other Ambulatory Visit (HOSPITAL_COMMUNITY): Payer: Self-pay

## 2024-01-11 MED ORDER — AMPHETAMINE-DEXTROAMPHETAMINE 30 MG PO TABS
ORAL_TABLET | ORAL | 0 refills | Status: AC
  Filled 2024-03-13: qty 75, 30d supply, fill #0

## 2024-01-11 MED ORDER — AMPHETAMINE-DEXTROAMPHETAMINE 30 MG PO TABS
ORAL_TABLET | ORAL | 0 refills | Status: AC
  Filled 2024-01-11: qty 75, 30d supply, fill #0

## 2024-01-11 MED ORDER — AMPHETAMINE-DEXTROAMPHETAMINE 30 MG PO TABS
ORAL_TABLET | ORAL | 0 refills | Status: AC
  Filled 2024-02-11: qty 75, 30d supply, fill #0

## 2024-01-15 NOTE — Progress Notes (Unsigned)
 [name] [address] [place] [address] Phone: [phone] Subjective:   ISusannah Gully, am serving as a scribe for Dr. Arthea Claudene.  I'm seeing this patient by the request  of:  Juliane Che, PA  CC: Back and neck pain follow-up  [name]  TAMER BAUGHMAN is a 33 y.o. male coming in with complaint of back and neck pain. Patient states same per usual. Getting dizzy while at rest. Has had to be driven home. Gets nausea and pressure in head then the dizziness. Needs to talk about surgery.  Medications patient has been prescribed:   Taking:         Reviewed prior external information including notes and imaging from previsou exam, outside providers and external EMR if available.   As well as notes that were available from care everywhere and other healthcare systems.  Past medical history, social, surgical and family history all reviewed in electronic medical record.  No pertanent information unless stated regarding to the chief complaint.   Past Medical History:  Diagnosis Date   ADD (attention deficit disorder)    Anxiety    Narcolepsy through school    Allergies  Allergen Reactions   Vueway  [Gadopiclenol ] Nausea Only     Review of Systems:  No , visual changes, nausea, vomiting, diarrhea, constipation,  abdominal pain, skin rash, fevers, chills, night sweats, weight loss, swollen lymph nodes, body aches, joint swelling, chest pain, shortness of breath, mood changes. POSITIVE muscle aches, headache, dizziness  Objective  Blood pressure (!) 132/98, pulse 90, height 6' (1.829 m), weight 245 lb (111.1 kg), SpO2 99%.   General: No apparent distress alert and oriented x3 mood and affect normal, dressed appropriately.  HEENT: Pupils equal, extraocular movements intact  Respiratory: Patient's speak in full sentences and does not appear short of breath  Cardiovascular: No lower extremity edema, non tender, no erythema   Gait MSK:  Back significant loss of lordosis.  Limited range of motion noted of the back.  Seems to be a little more aggravated than usual for this patient.  Tightness noted in the thoracolumbar juncture.  Osteopathic findings  C2 flexed rotated and side bent right C6 flexed rotated and side bent left T3 extended rotated and side bent right inhaled rib T11 extended rotated and side bent left L1 flexed rotated and side bent right Sacrum right on right       Assessment and Plan:  Sinusitis, chronic Recurrent infection.  Given doxycycline.  Discussed icing regimen and home exercises, discussed which activities to do and which ones to avoid increase activity slowly.  Follow-up again in 4 to 6 weeks  Scheuermann's disease Toradol  and Depo-Medrol  injection given today.  We discussed the epidural for the lumbar spine.  Patient is 33 years old and continues to have chronic pain.  Patient is likely doing significantly good with losing weight.  Continue to increase activity slowly.  Follow-up again in 6 to 12 weeks   Recheck labs for elevated lipase  Nonallopathic problems  Decision today to treat with OMT was based on Physical Exam  After verbal consent patient was treated with HVLA, ME, FPR techniques in cervical, rib, thoracic, lumbar, and sacral  areas  Patient tolerated the procedure well with improvement in symptoms  Patient given exercises, stretches and lifestyle modifications  See medications in patient instructions if given  Patient will follow up in 4-8 weeks     The above documentation has been reviewed and is  accurate and complete Couper Juncaj M Kamryn Gauthier, DO         Note: This dictation was prepared with Dragon dictation along with smaller phrase technology. Any transcriptional errors that result from this process are unintentional.

## 2024-01-16 ENCOUNTER — Encounter: Payer: Self-pay | Admitting: Family Medicine

## 2024-01-16 ENCOUNTER — Ambulatory Visit: Admitting: Family Medicine

## 2024-01-16 ENCOUNTER — Other Ambulatory Visit: Payer: Self-pay

## 2024-01-16 ENCOUNTER — Ambulatory Visit: Payer: Self-pay | Admitting: Family Medicine

## 2024-01-16 VITALS — BP 132/98 | HR 90 | Ht 72.0 in | Wt 245.0 lb

## 2024-01-16 DIAGNOSIS — M9901 Segmental and somatic dysfunction of cervical region: Secondary | ICD-10-CM

## 2024-01-16 DIAGNOSIS — M9908 Segmental and somatic dysfunction of rib cage: Secondary | ICD-10-CM

## 2024-01-16 DIAGNOSIS — R748 Abnormal levels of other serum enzymes: Secondary | ICD-10-CM

## 2024-01-16 DIAGNOSIS — M9903 Segmental and somatic dysfunction of lumbar region: Secondary | ICD-10-CM

## 2024-01-16 DIAGNOSIS — M9904 Segmental and somatic dysfunction of sacral region: Secondary | ICD-10-CM

## 2024-01-16 DIAGNOSIS — M4200 Juvenile osteochondrosis of spine, site unspecified: Secondary | ICD-10-CM

## 2024-01-16 DIAGNOSIS — J328 Other chronic sinusitis: Secondary | ICD-10-CM

## 2024-01-16 DIAGNOSIS — M545 Low back pain, unspecified: Secondary | ICD-10-CM

## 2024-01-16 DIAGNOSIS — R42 Dizziness and giddiness: Secondary | ICD-10-CM

## 2024-01-16 DIAGNOSIS — M9902 Segmental and somatic dysfunction of thoracic region: Secondary | ICD-10-CM

## 2024-01-16 DIAGNOSIS — M542 Cervicalgia: Secondary | ICD-10-CM

## 2024-01-16 LAB — HEMOGLOBIN A1C: Hgb A1c MFr Bld: 5 % (ref 4.6–6.5)

## 2024-01-16 LAB — COMPREHENSIVE METABOLIC PANEL WITH GFR
ALT: 30 U/L (ref 0–53)
AST: 20 U/L (ref 0–37)
Albumin: 4.5 g/dL (ref 3.5–5.2)
Alkaline Phosphatase: 48 U/L (ref 39–117)
BUN: 21 mg/dL (ref 6–23)
CO2: 25 meq/L (ref 19–32)
Calcium: 9.3 mg/dL (ref 8.4–10.5)
Chloride: 104 meq/L (ref 96–112)
Creatinine, Ser: 1.04 mg/dL (ref 0.40–1.50)
GFR: 94.85 mL/min (ref >60.00)
Glucose, Bld: 100 mg/dL — ABNORMAL HIGH (ref 70–99)
Potassium: 3.9 meq/L (ref 3.5–5.1)
Sodium: 139 meq/L (ref 135–145)
Total Bilirubin: 1.6 mg/dL — ABNORMAL HIGH (ref 0.2–1.2)
Total Protein: 7.2 g/dL (ref 6.0–8.3)

## 2024-01-16 LAB — GAMMA GT: GGT: 71 U/L — ABNORMAL HIGH (ref 7–51)

## 2024-01-16 LAB — LIPASE: Lipase: 77 U/L — ABNORMAL HIGH (ref 11.0–59.0)

## 2024-01-16 MED ORDER — KETOROLAC TROMETHAMINE 30 MG/ML IJ SOLN
30.0000 mg | Freq: Once | INTRAMUSCULAR | Status: AC
  Administered 2024-01-16: 30 mg via INTRAMUSCULAR

## 2024-01-16 MED ORDER — DOXYCYCLINE HYCLATE 100 MG PO TABS: 100.0000 mg | ORAL_TABLET | Freq: Two times a day (BID) | ORAL | 0 refills | Status: AC

## 2024-01-16 MED ORDER — METHYLPREDNISOLONE ACETATE 40 MG/ML IJ SUSP
40.0000 mg | Freq: Once | INTRAMUSCULAR | Status: AC
  Administered 2024-01-16: 40 mg via INTRAMUSCULAR

## 2024-01-16 MED ORDER — QVAR REDIHALER 80 MCG/ACT IN AERB: 1.0000 | INHALATION_SPRAY | Freq: Every day | RESPIRATORY_TRACT | 1 refills | Status: AC

## 2024-01-16 MED ORDER — PREDNISONE 20 MG PO TABS: 20.0000 mg | ORAL_TABLET | Freq: Every day | ORAL | 0 refills | Status: AC

## 2024-01-16 NOTE — Patient Instructions (Addendum)
 Prescriptions sent Half cocktail injection today. No NSAIDs for 24 hrs.  Labs today See you at next appointment

## 2024-01-16 NOTE — Assessment & Plan Note (Signed)
 Toradol  and Depo-Medrol  injection given today.  We discussed the epidural for the lumbar spine.  Patient is 33 years old and continues to have chronic pain.  Patient is likely doing significantly good with losing weight.  Continue to increase activity slowly.  Follow-up again in 6 to 12 weeks

## 2024-01-16 NOTE — Assessment & Plan Note (Signed)
 Recurrent infection.  Given doxycycline.  Discussed icing regimen and home exercises, discussed which activities to do and which ones to avoid increase activity slowly.  Follow-up again in 4 to 6 weeks

## 2024-01-21 NOTE — Discharge Instructions (Signed)

## 2024-01-22 ENCOUNTER — Inpatient Hospital Stay
Admission: RE | Admit: 2024-01-22 | Discharge: 2024-01-22 | Disposition: A | Source: Ambulatory Visit | Attending: Family Medicine | Admitting: Family Medicine

## 2024-01-22 DIAGNOSIS — M9902 Segmental and somatic dysfunction of thoracic region: Secondary | ICD-10-CM

## 2024-01-22 MED ORDER — TRIAMCINOLONE ACETONIDE 40 MG/ML IJ SUSP (RADIOLOGY)
60.0000 mg | Freq: Once | INTRAMUSCULAR | Status: AC
  Administered 2024-01-22: 60 mg via EPIDURAL

## 2024-01-22 MED ORDER — IOPAMIDOL (ISOVUE-M 300) INJECTION 61%
1.0000 mL | Freq: Once | INTRAMUSCULAR | Status: AC | PRN
  Administered 2024-01-22: 1 mL via EPIDURAL

## 2024-01-24 ENCOUNTER — Ambulatory Visit: Admitting: Family Medicine

## 2024-02-03 ENCOUNTER — Other Ambulatory Visit (HOSPITAL_COMMUNITY): Payer: Self-pay

## 2024-02-04 NOTE — Progress Notes (Signed)
 " [name] [address] [place] [address] Phone: [phone] Subjective:   Frank [name], am serving as a scribe for Dr. Arthea Claudene.  I'm seeing this patient by the request  of:  Juliane Che, PA  CC: Low back pain and neck pain follow-up  [name]  [name] is a 33 y.o. male coming in with complaint of back and neck pain. OMT 01/30/2024. Epidural on 01/22/2024. Patient states that epidural was helpful but pain is amplifed everywhere else.   Medications patient has been prescribed: Doxy, Qvar   Taking:         Reviewed prior external information including notes and imaging from previsou exam, outside providers and external EMR if available.   As well as notes that were available from care everywhere and other healthcare systems.  Past medical history, social, surgical and family history all reviewed in electronic medical record.  No pertanent information unless stated regarding to the chief complaint.   Past Medical History:  Diagnosis Date   ADD (attention deficit disorder)    Anxiety    Narcolepsy through school    Allergies[1]   Review of Systems:  No headache, visual changes, nausea, vomiting, diarrhea, constipation, dizziness, abdominal pain, skin rash, fevers, chills, night sweats, weight loss, swollen lymph nodes, body aches, joint swelling, chest pain, shortness of breath, mood changes. POSITIVE muscle aches  Objective  Blood pressure 132/86, pulse 86, height 6' (1.829 m), weight 257 lb (116.6 kg), SpO2 97%.   General: No apparent distress alert and oriented x3 mood and affect normal, dressed appropriately.  HEENT: Pupils equal, extraocular movements intact  Respiratory: Patient's speak in full sentences and does not appear short of breath  Cardiovascular: No lower extremity edema, non tender, no erythema  Gait MSK:  Back tightness noted in the paraspinal musculature.  Does have some difficulty with  external rotation of the left hip which is different.  Neck exam tightness on the left side of the neck noted.  Left hip does have positive impingement with internal and external range of motion.  Osteopathic findings  C2 flexed rotated and side bent right C6 flexed rotated and side bent left T3 extended rotated and side bent right inhaled rib T9 extended rotated and side bent left L2 flexed rotated and side bent right L5 flexed rotated and side bent left Sacrum right on right       Assessment and Plan:  Scheuermann's disease Chronic, with exacerbation.  The patient is going to start trying to increase activity and start working out on a more regular basis.  Discussed icing regimen and home exercises, increase activity slowly.  Discussed icing regimen.  Follow-up again in 6 to 8 weeks  Abdominal pain, chronic, right lower quadrant Significant improvement from previous exam.  Doing well with a downtrending lipase level.    Nonallopathic problems  Decision today to treat with OMT was based on Physical Exam  After verbal consent patient was treated with HVLA, ME, FPR techniques in cervical, rib, thoracic, lumbar, and sacral  areas  Patient tolerated the procedure well with improvement in symptoms  Patient given exercises, stretches and lifestyle modifications  See medications in patient instructions if given  Patient will follow up in 4-8 weeks    The above documentation has been reviewed and is accurate and complete [name] M [name], DO          Note: This dictation was prepared with Dragon dictation along with smaller phrase technology. Any  transcriptional errors that result from this process are unintentional.             [1]  Allergies Allergen Reactions   Vueway  [Gadopiclenol ] Nausea Only   "

## 2024-02-06 ENCOUNTER — Encounter: Payer: Self-pay | Admitting: Family Medicine

## 2024-02-06 ENCOUNTER — Ambulatory Visit: Admitting: Family Medicine

## 2024-02-06 ENCOUNTER — Ambulatory Visit

## 2024-02-06 VITALS — BP 132/86 | HR 86 | Ht 72.0 in | Wt 257.0 lb

## 2024-02-06 DIAGNOSIS — M9902 Segmental and somatic dysfunction of thoracic region: Secondary | ICD-10-CM

## 2024-02-06 DIAGNOSIS — M9904 Segmental and somatic dysfunction of sacral region: Secondary | ICD-10-CM | POA: Diagnosis not present

## 2024-02-06 DIAGNOSIS — M9908 Segmental and somatic dysfunction of rib cage: Secondary | ICD-10-CM | POA: Diagnosis not present

## 2024-02-06 DIAGNOSIS — M25552 Pain in left hip: Secondary | ICD-10-CM

## 2024-02-06 DIAGNOSIS — M4200 Juvenile osteochondrosis of spine, site unspecified: Secondary | ICD-10-CM

## 2024-02-06 DIAGNOSIS — M9901 Segmental and somatic dysfunction of cervical region: Secondary | ICD-10-CM

## 2024-02-06 DIAGNOSIS — R1031 Right lower quadrant pain: Secondary | ICD-10-CM | POA: Diagnosis not present

## 2024-02-06 DIAGNOSIS — M9903 Segmental and somatic dysfunction of lumbar region: Secondary | ICD-10-CM

## 2024-02-06 DIAGNOSIS — G8929 Other chronic pain: Secondary | ICD-10-CM

## 2024-02-06 MED ORDER — METHYLPREDNISOLONE ACETATE 40 MG/ML IJ SUSP
80.0000 mg | Freq: Once | INTRAMUSCULAR | Status: AC
  Administered 2024-02-06: 80 mg via INTRAMUSCULAR

## 2024-02-06 MED ORDER — KETOROLAC TROMETHAMINE 30 MG/ML IJ SOLN
60.0000 mg | Freq: Once | INTRAMUSCULAR | Status: AC
  Administered 2024-02-06: 60 mg via INTRAMUSCULAR

## 2024-02-06 NOTE — Assessment & Plan Note (Signed)
 Significant improvement from previous exam.  Doing well with a downtrending lipase level.

## 2024-02-06 NOTE — Assessment & Plan Note (Signed)
 Chronic, with exacerbation.  The patient is going to start trying to increase activity and start working out on a more regular basis.  Discussed icing regimen and home exercises, increase activity slowly.  Discussed icing regimen.  Follow-up again in 6 to 8 weeks

## 2024-02-06 NOTE — Patient Instructions (Addendum)
 Good luck rat hunting DHEA 50mg  4 weeks on 2 weeks off Full cocktail injection today.  See me as scheduled

## 2024-02-09 ENCOUNTER — Other Ambulatory Visit (HOSPITAL_COMMUNITY): Payer: Self-pay

## 2024-02-11 ENCOUNTER — Other Ambulatory Visit: Payer: Self-pay | Admitting: Primary Care

## 2024-02-11 ENCOUNTER — Other Ambulatory Visit (HOSPITAL_COMMUNITY): Payer: Self-pay

## 2024-02-11 ENCOUNTER — Other Ambulatory Visit: Payer: Self-pay

## 2024-02-11 MED ORDER — BENZONATATE 200 MG PO CAPS
200.0000 mg | ORAL_CAPSULE | Freq: Every day | ORAL | 0 refills | Status: AC | PRN
  Filled 2024-02-11: qty 20, 20d supply, fill #0

## 2024-02-11 MED ORDER — FLUTICASONE-SALMETEROL 115-21 MCG/ACT IN AERO
2.0000 | INHALATION_SPRAY | Freq: Two times a day (BID) | RESPIRATORY_TRACT | 0 refills | Status: AC
  Filled 2024-02-11: qty 12, 30d supply, fill #0

## 2024-02-12 ENCOUNTER — Other Ambulatory Visit: Payer: Self-pay

## 2024-02-12 ENCOUNTER — Other Ambulatory Visit (HOSPITAL_COMMUNITY): Payer: Self-pay

## 2024-02-20 ENCOUNTER — Other Ambulatory Visit (HOSPITAL_COMMUNITY): Payer: Self-pay

## 2024-02-20 ENCOUNTER — Encounter: Payer: Self-pay | Admitting: Physician Assistant

## 2024-02-20 ENCOUNTER — Other Ambulatory Visit: Payer: Self-pay

## 2024-02-20 ENCOUNTER — Ambulatory Visit: Admitting: Physician Assistant

## 2024-02-20 VITALS — BP 135/87

## 2024-02-20 DIAGNOSIS — L700 Acne vulgaris: Secondary | ICD-10-CM

## 2024-02-20 DIAGNOSIS — L309 Dermatitis, unspecified: Secondary | ICD-10-CM | POA: Diagnosis not present

## 2024-02-20 MED ORDER — MOMETASONE FUROATE 0.1 % EX SOLN
CUTANEOUS | 2 refills | Status: AC
  Filled 2024-02-20: qty 60, 30d supply, fill #0

## 2024-02-20 MED ORDER — CLOBETASOL PROPIONATE 0.05 % EX SOLN
1.0000 | Freq: Two times a day (BID) | CUTANEOUS | 3 refills | Status: AC
  Filled 2024-02-20 (×2): qty 50, 25d supply, fill #0

## 2024-02-20 MED ORDER — KETOCONAZOLE 2 % EX SHAM
1.0000 | MEDICATED_SHAMPOO | Freq: Once | CUTANEOUS | 6 refills | Status: AC
  Filled 2024-02-20 (×2): qty 120, 24d supply, fill #0

## 2024-02-20 NOTE — Progress Notes (Signed)
" ° °  New Patient Visit   Subjective  Frank [name] is a 34 y.o. male NEW PATIENT who presents for the following: Dryness and flaking of scalp and face. He get some red spots also. He sometimes gets a similar scab on his knee. He is using a zinc shampoo that seems to be helping. He would also like to discuss acne. He is not using anything right now to treat acne.    The following portions of the chart were reviewed this encounter and updated as appropriate: medications, allergies, medical history  Review of Systems:  No other skin or systemic complaints except as noted in HPI or Assessment and Plan.  Objective  Well appearing patient in no apparent distress; mood and affect are within normal limits.   A focused examination was performed of the following areas: Face, scalp, ears, back, chest, elbows and knees.    Relevant exam findings are noted in the Assessment and Plan.    Assessment & Plan   Dermatitis (Seborrheic dermatitis vs Psoriasis)  Exam: Scaly pink papules coalescing to plaques  Treatment Plan: Ketoconazole  2% shampoo Wash scalp. Let sit a few minutes before rinsing. Alternate every other wash with Neutrogena T-Sal shampoo.  Mometasone  lotion Apply to affected areas of face and ears twice daily up to 3 days per week as needed.  Clobetasol  solution Apply to scalp twice daily as needed.  Ask PCP for referral to Rheumatology as he has had chronic pain for many years and if he does have psoriasis, he may indeed have psoriatic arthritis.    ACNE VULGARIS Exam: resolving papules     Treatment Plan: Recommend washing daily with Panoxyl 10 wash    ACNE VULGARIS   DERMATITIS, UNSPECIFIED    Return in about 4 months (around 06/19/2024) for Dermatitis follow up.  I, Roseline Hutchinson, CMA, am acting as scribe for Britteny Fiebelkorn K, PA-C .   Documentation: I have reviewed the above documentation for accuracy and completeness, and I agree with the  above.  Gregery Walberg K, PA-C    "

## 2024-02-20 NOTE — Patient Instructions (Addendum)
 Topical steroids (such as triamcinolone, fluocinolone, fluocinonide, mometasone, clobetasol, halobetasol, betamethasone, hydrocortisone) can cause thinning and lightening of the skin if they are used for too long in the same area. Your physician has selected the right strength medicine for your problem and area affected on the body. Please use your medication only as directed by your physician to prevent side effects.    Important Information  Due to recent changes in healthcare laws, you may see results of your pathology and/or laboratory studies on MyChart before the doctors have had a chance to review them. We understand that in some cases there may be results that are confusing or concerning to you. Please understand that not all results are received at the same time and often the doctors may need to interpret multiple results in order to provide you with the best plan of care or course of treatment. Therefore, we ask that you please give [place] 2 business days to thoroughly review all your results before contacting the office for clarification. Should we see a critical lab result, you will be contacted sooner.   If You Need Anything After Your Visit  If you have any questions or concerns for your doctor, please call our main line at [phone] If no one answers, please leave a voicemail as directed and we will return your call as soon as possible. Messages left after 4 pm will be answered the following business day.   You may also send [place] a message via MyChart. We typically respond to MyChart messages within 1-2 business days.  For prescription refills, please ask your pharmacy to contact our office. Our fax number is [phone].  If you have an urgent issue when the clinic is closed that cannot wait until the next business day, you can page your doctor at the number below.    Please note that while we do our best to be available for urgent issues outside of office hours, we are not  available 24/7.   If you have an urgent issue and are unable to reach [place], you may choose to seek medical care at your doctor's office, retail clinic, urgent care center, or emergency room.  If you have a medical emergency, please immediately call 911 or go to the emergency department. In the event of inclement weather, please call our main line at [phone] for an update on the status of any delays or closures.  Dermatology Medication Tips: Please keep the boxes that topical medications come in in order to help keep track of the instructions about where and how to use these. Pharmacies typically print the medication instructions only on the boxes and not directly on the medication tubes.   If your medication is too expensive, please contact our office at [phone] or send [place] a message through MyChart.   We are unable to tell what your co-pay for medications will be in advance as this is different depending on your insurance coverage. However, we may be able to find a substitute medication at lower cost or fill out paperwork to get insurance to cover a needed medication.   If a prior authorization is required to get your medication covered by your insurance company, please allow [place] 1-2 business days to complete this process.  Drug prices often vary depending on where the prescription is filled and some pharmacies may offer cheaper prices.  The website www.goodrx.com contains coupons for medications through different pharmacies. The prices here do not account for what  the cost may be with help from insurance (it may be cheaper with your insurance), but the website can give you the price if you did not use any insurance.  - You can print the associated coupon and take it with your prescription to the pharmacy.  - You may also stop by our office during regular business hours and pick up a GoodRx coupon card.  - If you need your prescription sent electronically to a different pharmacy, notify  our office through [facility] or by phone at [phone]

## 2024-02-20 NOTE — Progress Notes (Signed)
 " [name] [address] [place] [address] Phone: [phone] Subjective:   Frank [name] Frank [name], am serving as a scribe for Dr. Arthea Claudene.  I'm seeing this patient by the request  of:  Juliane Che, PA  CC: Back pain and multiple other complaint follow-up  [name]  Frank [name] is a 34 y.o. male coming in with complaint of back and neck pain. OMT 02/06/2024. Patient states that he has pain in hips that is affecting other areas. Pain in R side of ribs with extreme ROM.   Medications patient has been prescribed: Qvar   Taking:         Reviewed prior external information including notes and imaging from previsou exam, outside providers and external EMR if available.   As well as notes that were available from care everywhere and other healthcare systems.  Past medical history, social, surgical and family history all reviewed in electronic medical record.  No pertanent information unless stated regarding to the chief complaint.   Past Medical History:  Diagnosis Date   ADD (attention deficit disorder)    Anxiety    Narcolepsy through school    Allergies[1]   Review of Systems:  No headache, visual changes, nausea, vomiting, diarrhea, constipation, dizziness, abdominal pain, skin rash, fevers, chills, night sweats, weight loss, swollen lymph nodes, body aches, joint swelling, chest pain, shortness of breath, mood changes. POSITIVE muscle aches  Objective  Blood pressure (!) 128/92, pulse 80, height 6' (1.829 m), weight 246 lb (111.6 kg), SpO2 98%.   General: No apparent distress alert and oriented x3 mood and affect normal, dressed appropriately.  HEENT: Pupils equal, extraocular movements intact  Respiratory: Patient's speak in full sentences and does not appear short of breath  Cardiovascular: No lower extremity edema, non tender, no erythema  Gait MSK:  Back exam tightness noted in the thoracic area with some kyphosis  noted.  Seems to be more on the left side than usual.  Usually.  No midline tenderness.  To be muscular in nature.  Osteopathic findings  C2 flexed rotated and side bent right C7 flexed rotated and side bent right T3 extended rotated and side bent left inhaled rib T6 extended rotated and side bent left inhaled rib L2 flexed rotated and side bent right Sacrum right on right       Assessment and Plan:  Kyphosis of thoracic region Responded extremely well though to osteopathic manipulation today, continuing to work on weight loss and encouraged him to get down to 230 likely would be significantly helpful for this individual.  Patient has been on a another 11 pounds since we have seen him.  Encourage patient to continue to start working on core strengthening.  Do believe he will continue to make significant improvement.  Toradol  and Depo-Medrol  injections given again secondary to the severity of discomfort when the patient has these flares.  Follow-up again in 6 to 8 weeks otherwise.    Nonallopathic problems  Decision today to treat with OMT was based on Physical Exam  After verbal consent patient was treated with HVLA, ME, FPR techniques in cervical, rib, thoracic, lumbar, and sacral  areas  Patient tolerated the procedure well with improvement in symptoms  Patient given exercises, stretches and lifestyle modifications  See medications in patient instructions if given  Patient will follow up in 4-8 weeks     The above documentation has been reviewed and is accurate and complete Frank [name] M Sigmond Patalano, DO  Note: This dictation was prepared with Dragon dictation along with smaller phrase technology. Any transcriptional errors that result from this process are unintentional.            [1]  Allergies Allergen Reactions   Acetazolamide Other (See Comments) and Swelling    Hands, feet and jaw- numbness and burning sensation   Hands, feet and jaw- numbness and burning  sensation   Vueway  [Gadopiclenol ] Nausea Only   "

## 2024-02-21 ENCOUNTER — Ambulatory Visit: Admitting: Family Medicine

## 2024-02-21 ENCOUNTER — Encounter: Payer: Self-pay | Admitting: Family Medicine

## 2024-02-21 VITALS — BP 128/92 | HR 80 | Ht 72.0 in | Wt 246.0 lb

## 2024-02-21 DIAGNOSIS — M9903 Segmental and somatic dysfunction of lumbar region: Secondary | ICD-10-CM

## 2024-02-21 DIAGNOSIS — M545 Low back pain, unspecified: Secondary | ICD-10-CM

## 2024-02-21 DIAGNOSIS — M9908 Segmental and somatic dysfunction of rib cage: Secondary | ICD-10-CM

## 2024-02-21 DIAGNOSIS — M40204 Unspecified kyphosis, thoracic region: Secondary | ICD-10-CM

## 2024-02-21 DIAGNOSIS — M9901 Segmental and somatic dysfunction of cervical region: Secondary | ICD-10-CM

## 2024-02-21 DIAGNOSIS — M9904 Segmental and somatic dysfunction of sacral region: Secondary | ICD-10-CM

## 2024-02-21 DIAGNOSIS — M9902 Segmental and somatic dysfunction of thoracic region: Secondary | ICD-10-CM

## 2024-02-21 MED ORDER — METHYLPREDNISOLONE ACETATE 80 MG/ML IJ SUSP
80.0000 mg | Freq: Once | INTRAMUSCULAR | Status: AC
  Administered 2024-02-21: 80 mg via INTRAMUSCULAR

## 2024-02-21 MED ORDER — KETOROLAC TROMETHAMINE 60 MG/2ML IM SOLN
60.0000 mg | Freq: Once | INTRAMUSCULAR | Status: AC
  Administered 2024-02-21: 60 mg via INTRAMUSCULAR

## 2024-02-21 NOTE — Patient Instructions (Addendum)
 Injections in backside. No NSAIDs for 24 hours.  Movement in mid back See me as scheduled

## 2024-02-21 NOTE — Assessment & Plan Note (Signed)
 Responded extremely well though to osteopathic manipulation today, continuing to work on weight loss and encouraged him to get down to 230 likely would be significantly helpful for this individual.  Patient has been on a another 11 pounds since we have seen him.  Encourage patient to continue to start working on core strengthening.  Do believe he will continue to make significant improvement.  Toradol  and Depo-Medrol  injections given again secondary to the severity of discomfort when the patient has these flares.  Follow-up again in 6 to 8 weeks otherwise.

## 2024-03-06 ENCOUNTER — Other Ambulatory Visit (HOSPITAL_COMMUNITY): Payer: Self-pay

## 2024-03-06 MED ORDER — QVAR REDIHALER 80 MCG/ACT IN AERB
1.0000 | INHALATION_SPRAY | Freq: Every day | RESPIRATORY_TRACT | 3 refills | Status: AC
  Filled 2024-03-06: qty 10.6, 90d supply, fill #0

## 2024-03-07 ENCOUNTER — Other Ambulatory Visit (HOSPITAL_COMMUNITY): Payer: Self-pay

## 2024-03-08 ENCOUNTER — Other Ambulatory Visit (HOSPITAL_COMMUNITY): Payer: Self-pay

## 2024-03-09 ENCOUNTER — Other Ambulatory Visit (HOSPITAL_COMMUNITY): Payer: Self-pay

## 2024-03-10 ENCOUNTER — Other Ambulatory Visit (HOSPITAL_COMMUNITY): Payer: Self-pay

## 2024-03-11 NOTE — Progress Notes (Unsigned)
 " [name] [address] [place] [address] Phone: [phone] Subjective:   [name], am serving as a scribe for Dr. Arthea Claudene.  I'm seeing this patient by the request  of:  Juliane Che, PA  CC: Back and neck pain follow-up  [name]  Boomer Winders is a 34 y.o. male coming in with complaint of back and neck pain. OMT 02/21/2024. Patient states that his pain has been the same as it usually is.   Medications patient has been prescribed: None  Taking:         Reviewed prior external information including notes and imaging from previsou exam, outside providers and external EMR if available.   As well as notes that were available from care everywhere and other healthcare systems.  Past medical history, social, surgical and family history all reviewed in electronic medical record.  No pertanent information unless stated regarding to the chief complaint.   Past Medical History:  Diagnosis Date   ADD (attention deficit disorder)    Anxiety    Narcolepsy through school    Allergies[1]   Review of Systems:  No headache, visual changes, nausea, vomiting, diarrhea, constipation, dizziness, abdominal pain, skin rash, fevers, chills, night sweats, weight loss, swollen lymph nodes, body aches, joint swelling, chest pain, shortness of breath, mood changes. POSITIVE muscle aches  Objective  Blood pressure 124/78, pulse 83, height 6' (1.829 m), weight 244 lb (110.7 kg), SpO2 97%.   General: No apparent distress alert and oriented x3 mood and affect normal, dressed appropriately.  HEENT: Pupils equal, extraocular movements intact  Respiratory: Patient's speak in full sentences and does not appear short of breath  Cardiovascular: No lower extremity edema, non tender, no erythema  Gait relatively normal MSK:  Back continued kyphosis in the upper thoracic spine.  Having more difficulty with the latissimus dorsi seems to be tight  with the tendon going into the insertion on the humerus.  Osteopathic findings  C2 flexed rotated and side bent right C6 flexed rotated and side bent left T3 extended rotated and side bent left inhaled rib T9 extended rotated and side bent left inhaled rib T11 extended rotated sidebent right L2 flexed rotated and side bent right Sacrum right on right     Assessment and Plan:  Scheuermann's disease Continue to try to increase activity.  Patient has done well with his weight loss.  We discussed with patient to continue with some strengthening and discussed more of the latissimus dorsi stabilization.  Discussed some core stuff but while avoiding any significant repetitive flexion of the back that could cause a some exacerbation.  Increase activity slowly otherwise.  Follow-up with me again in 6 to 8 weeks.    Nonallopathic problems  Decision today to treat with OMT was based on Physical Exam  After verbal consent patient was treated with HVLA, ME, FPR techniques in cervical, rib, thoracic, lumbar, and sacral  areas  Patient tolerated the procedure well with improvement in symptoms  Patient given exercises, stretches and lifestyle modifications  See medications in patient instructions if given  Patient will follow up in 4-8 weeks     The above documentation has been reviewed and is accurate and complete Gerard Bonus M Iasha Mccalister, DO         Note: This dictation was prepared with Dragon dictation along with smaller phrase technology. Any transcriptional errors that result from this process are unintentional.            [  1]  Allergies Allergen Reactions   Acetazolamide Other (See Comments) and Swelling    Hands, feet and jaw- numbness and burning sensation   Hands, feet and jaw- numbness and burning sensation   Vueway  [Gadopiclenol ] Nausea Only   "

## 2024-03-12 ENCOUNTER — Other Ambulatory Visit (HOSPITAL_COMMUNITY): Payer: Self-pay

## 2024-03-13 ENCOUNTER — Ambulatory Visit: Admitting: Family Medicine

## 2024-03-13 ENCOUNTER — Other Ambulatory Visit (HOSPITAL_COMMUNITY): Payer: Self-pay

## 2024-03-13 VITALS — BP 124/78 | HR 83 | Ht 72.0 in | Wt 244.0 lb

## 2024-03-13 DIAGNOSIS — M9901 Segmental and somatic dysfunction of cervical region: Secondary | ICD-10-CM | POA: Diagnosis not present

## 2024-03-13 DIAGNOSIS — M9903 Segmental and somatic dysfunction of lumbar region: Secondary | ICD-10-CM | POA: Diagnosis not present

## 2024-03-13 DIAGNOSIS — M9904 Segmental and somatic dysfunction of sacral region: Secondary | ICD-10-CM

## 2024-03-13 DIAGNOSIS — M4200 Juvenile osteochondrosis of spine, site unspecified: Secondary | ICD-10-CM

## 2024-03-13 DIAGNOSIS — M545 Low back pain, unspecified: Secondary | ICD-10-CM | POA: Diagnosis not present

## 2024-03-13 DIAGNOSIS — M9902 Segmental and somatic dysfunction of thoracic region: Secondary | ICD-10-CM

## 2024-03-13 DIAGNOSIS — R748 Abnormal levels of other serum enzymes: Secondary | ICD-10-CM

## 2024-03-13 DIAGNOSIS — R1011 Right upper quadrant pain: Secondary | ICD-10-CM | POA: Diagnosis not present

## 2024-03-13 DIAGNOSIS — M9908 Segmental and somatic dysfunction of rib cage: Secondary | ICD-10-CM

## 2024-03-13 DIAGNOSIS — G8929 Other chronic pain: Secondary | ICD-10-CM

## 2024-03-13 MED ORDER — KETOROLAC TROMETHAMINE 60 MG/2ML IM SOLN
60.0000 mg | Freq: Once | INTRAMUSCULAR | Status: AC
  Administered 2024-03-13: 60 mg via INTRAMUSCULAR

## 2024-03-13 NOTE — Patient Instructions (Addendum)
 Good to see you. Toradol  60 mg in backside. No NSAIDs for 24 hours. Will do labs next time.  See me again as scheduled.

## 2024-03-13 NOTE — Assessment & Plan Note (Signed)
 Recheck liver enzymes before follow-up in 3 to 4 weeks

## 2024-03-13 NOTE — Assessment & Plan Note (Signed)
 Continue to try to increase activity.  Patient has done well with his weight loss.  We discussed with patient to continue with some strengthening and discussed more of the latissimus dorsi stabilization.  Discussed some core stuff but while avoiding any significant repetitive flexion of the back that could cause a some exacerbation.  Increase activity slowly otherwise.  Follow-up with me again in 6 to 8 weeks.

## 2024-03-18 ENCOUNTER — Other Ambulatory Visit (HOSPITAL_COMMUNITY): Payer: Self-pay

## 2024-03-18 ENCOUNTER — Other Ambulatory Visit: Payer: Self-pay

## 2024-03-18 MED ORDER — METHOCARBAMOL 500 MG PO TABS
500.0000 mg | ORAL_TABLET | Freq: Four times a day (QID) | ORAL | 1 refills | Status: AC
  Filled 2024-03-18: qty 120, 30d supply, fill #0

## 2024-03-18 MED ORDER — OXYCODONE HCL 10 MG PO TABS: 10.0000 mg | ORAL_TABLET | Freq: Three times a day (TID) | ORAL | 0 refills | Status: AC | PRN

## 2024-03-18 MED ORDER — LEVOTHYROXINE SODIUM 25 MCG PO TABS
25.0000 ug | ORAL_TABLET | Freq: Every day | ORAL | 1 refills | Status: AC
  Filled 2024-03-18: qty 90, 90d supply, fill #0

## 2024-03-18 MED ORDER — OXYCODONE HCL 10 MG PO TABS
10.0000 mg | ORAL_TABLET | Freq: Three times a day (TID) | ORAL | 0 refills | Status: AC | PRN
  Filled 2024-03-18: qty 90, 30d supply, fill #0

## 2024-03-19 ENCOUNTER — Other Ambulatory Visit (HOSPITAL_COMMUNITY): Payer: Self-pay

## 2024-04-03 ENCOUNTER — Ambulatory Visit: Admitting: Family Medicine

## 2024-04-24 ENCOUNTER — Ambulatory Visit: Admitting: Family Medicine

## 2024-04-30 ENCOUNTER — Ambulatory Visit: Admitting: Primary Care

## 2024-05-19 ENCOUNTER — Ambulatory Visit: Admitting: Family Medicine

## 2024-06-12 ENCOUNTER — Ambulatory Visit: Admitting: Family Medicine

## 2024-07-01 ENCOUNTER — Ambulatory Visit: Admitting: Physician Assistant

## 2024-07-03 ENCOUNTER — Ambulatory Visit: Admitting: Family Medicine

## 2024-07-24 ENCOUNTER — Ambulatory Visit: Admitting: Family Medicine

## 2024-08-13 ENCOUNTER — Ambulatory Visit: Admitting: Family Medicine
